# Patient Record
Sex: Male | Born: 1935
Health system: Southern US, Community
[De-identification: ages and names within clinical notes are randomized; demographics above are authoritative.]

## PROBLEM LIST (undated history)

## (undated) DIAGNOSIS — E785 Hyperlipidemia, unspecified: Secondary | ICD-10-CM

## (undated) DIAGNOSIS — I779 Disorder of arteries and arterioles, unspecified: Secondary | ICD-10-CM

## (undated) DIAGNOSIS — I10 Essential (primary) hypertension: Secondary | ICD-10-CM

## (undated) DIAGNOSIS — IMO0002 Reserved for concepts with insufficient information to code with codable children: Secondary | ICD-10-CM

## (undated) DIAGNOSIS — I2510 Atherosclerotic heart disease of native coronary artery without angina pectoris: Secondary | ICD-10-CM

## (undated) DIAGNOSIS — R001 Bradycardia, unspecified: Secondary | ICD-10-CM

## (undated) DIAGNOSIS — R569 Unspecified convulsions: Secondary | ICD-10-CM

## (undated) DIAGNOSIS — Q600 Renal agenesis, unilateral: Secondary | ICD-10-CM

## (undated) DIAGNOSIS — H15009 Unspecified scleritis, unspecified eye: Secondary | ICD-10-CM

## (undated) HISTORY — DX: Essential (primary) hypertension: I10

## (undated) HISTORY — DX: Bradycardia, unspecified: R00.1

## (undated) HISTORY — DX: Unspecified scleritis, unspecified eye: H15.009

## (undated) HISTORY — DX: Renal agenesis, unilateral: Q60.0

## (undated) HISTORY — DX: Reserved for concepts with insufficient information to code with codable children: IMO0002

## (undated) HISTORY — DX: Unspecified convulsions: R56.9

## (undated) HISTORY — DX: Hyperlipidemia, unspecified: E78.5

## (undated) HISTORY — DX: Atherosclerotic heart disease of native coronary artery without angina pectoris: I25.10

---

## 2001-04-19 ENCOUNTER — Ambulatory Visit (HOSPITAL_COMMUNITY): Admission: RE | Admit: 2001-04-19 | Discharge: 2001-04-19 | Payer: Self-pay | Admitting: Gastroenterology

## 2001-04-19 ENCOUNTER — Encounter (INDEPENDENT_AMBULATORY_CARE_PROVIDER_SITE_OTHER): Payer: Self-pay | Admitting: Specialist

## 2004-06-15 ENCOUNTER — Ambulatory Visit (HOSPITAL_COMMUNITY): Admission: RE | Admit: 2004-06-15 | Discharge: 2004-06-15 | Payer: Self-pay | Admitting: Gastroenterology

## 2004-06-15 ENCOUNTER — Encounter (INDEPENDENT_AMBULATORY_CARE_PROVIDER_SITE_OTHER): Payer: Self-pay | Admitting: Specialist

## 2007-03-04 ENCOUNTER — Inpatient Hospital Stay (HOSPITAL_COMMUNITY): Admission: EM | Admit: 2007-03-04 | Discharge: 2007-03-06 | Payer: Self-pay | Admitting: Emergency Medicine

## 2007-03-11 ENCOUNTER — Inpatient Hospital Stay (HOSPITAL_COMMUNITY): Admission: EM | Admit: 2007-03-11 | Discharge: 2007-03-18 | Payer: Self-pay | Admitting: Emergency Medicine

## 2007-09-19 HISTORY — PX: PACEMAKER INSERTION: SHX728

## 2007-12-17 ENCOUNTER — Encounter: Payer: Self-pay | Admitting: Internal Medicine

## 2008-05-22 ENCOUNTER — Encounter: Payer: Self-pay | Admitting: Internal Medicine

## 2008-05-29 ENCOUNTER — Encounter: Payer: Self-pay | Admitting: Internal Medicine

## 2008-07-09 ENCOUNTER — Ambulatory Visit: Payer: Self-pay | Admitting: Internal Medicine

## 2008-07-10 ENCOUNTER — Encounter (INDEPENDENT_AMBULATORY_CARE_PROVIDER_SITE_OTHER): Payer: Self-pay | Admitting: Internal Medicine

## 2008-07-10 ENCOUNTER — Ambulatory Visit: Payer: Self-pay | Admitting: Vascular Surgery

## 2008-07-10 ENCOUNTER — Inpatient Hospital Stay (HOSPITAL_COMMUNITY): Admission: EM | Admit: 2008-07-10 | Discharge: 2008-07-12 | Payer: Self-pay | Admitting: Emergency Medicine

## 2008-07-10 ENCOUNTER — Ambulatory Visit: Payer: Self-pay | Admitting: Internal Medicine

## 2008-07-17 ENCOUNTER — Ambulatory Visit: Payer: Self-pay | Admitting: Cardiology

## 2008-07-17 ENCOUNTER — Inpatient Hospital Stay (HOSPITAL_COMMUNITY): Admission: RE | Admit: 2008-07-17 | Discharge: 2008-07-18 | Payer: Self-pay | Admitting: Cardiology

## 2008-07-21 ENCOUNTER — Ambulatory Visit: Payer: Self-pay | Admitting: Cardiology

## 2008-07-21 LAB — CONVERTED CEMR LAB
BUN: 18 mg/dL (ref 6–23)
Basophils Absolute: 0 10*3/uL (ref 0.0–0.1)
Basophils Relative: 0 % (ref 0.0–3.0)
CO2: 28 meq/L (ref 19–32)
Calcium: 9.2 mg/dL (ref 8.4–10.5)
Chloride: 97 meq/L (ref 96–112)
Creatinine, Ser: 1.2 mg/dL (ref 0.4–1.5)
Eosinophils Absolute: 0.5 10*3/uL (ref 0.0–0.7)
Eosinophils Relative: 6.3 % — ABNORMAL HIGH (ref 0.0–5.0)
GFR calc Af Amer: 76 mL/min
GFR calc non Af Amer: 63 mL/min
Glucose, Bld: 89 mg/dL (ref 70–99)
HCT: 32 % — ABNORMAL LOW (ref 39.0–52.0)
Hemoglobin: 11.1 g/dL — ABNORMAL LOW (ref 13.0–17.0)
Lymphocytes Relative: 9.2 % — ABNORMAL LOW (ref 12.0–46.0)
MCHC: 34.8 g/dL (ref 30.0–36.0)
MCV: 98.7 fL (ref 78.0–100.0)
Monocytes Absolute: 1 10*3/uL (ref 0.1–1.0)
Monocytes Relative: 12.3 % — ABNORMAL HIGH (ref 3.0–12.0)
Neutro Abs: 5.7 10*3/uL (ref 1.4–7.7)
Neutrophils Relative %: 72.2 % (ref 43.0–77.0)
Platelets: 207 10*3/uL (ref 150–400)
Potassium: 4 meq/L (ref 3.5–5.1)
RBC: 3.24 M/uL — ABNORMAL LOW (ref 4.22–5.81)
RDW: 12.8 % (ref 11.5–14.6)
Sodium: 132 meq/L — ABNORMAL LOW (ref 135–145)
WBC: 7.9 10*3/uL (ref 4.5–10.5)

## 2008-07-27 ENCOUNTER — Ambulatory Visit: Payer: Self-pay | Admitting: Internal Medicine

## 2008-07-30 ENCOUNTER — Encounter (HOSPITAL_COMMUNITY): Admission: RE | Admit: 2008-07-30 | Discharge: 2008-09-16 | Payer: Self-pay | Admitting: Cardiology

## 2008-08-05 ENCOUNTER — Ambulatory Visit: Payer: Self-pay

## 2008-08-19 ENCOUNTER — Encounter: Payer: Self-pay | Admitting: Internal Medicine

## 2008-08-25 ENCOUNTER — Ambulatory Visit (HOSPITAL_COMMUNITY): Admission: RE | Admit: 2008-08-25 | Discharge: 2008-08-25 | Payer: Self-pay | Admitting: Family Medicine

## 2008-09-18 ENCOUNTER — Encounter (HOSPITAL_COMMUNITY): Admission: RE | Admit: 2008-09-18 | Discharge: 2008-11-16 | Payer: Self-pay | Admitting: Cardiology

## 2008-09-21 ENCOUNTER — Ambulatory Visit: Payer: Self-pay | Admitting: Internal Medicine

## 2008-09-22 DIAGNOSIS — I2510 Atherosclerotic heart disease of native coronary artery without angina pectoris: Secondary | ICD-10-CM | POA: Insufficient documentation

## 2008-09-22 DIAGNOSIS — I495 Sick sinus syndrome: Secondary | ICD-10-CM | POA: Insufficient documentation

## 2008-09-22 DIAGNOSIS — E782 Mixed hyperlipidemia: Secondary | ICD-10-CM | POA: Insufficient documentation

## 2008-09-22 DIAGNOSIS — R609 Edema, unspecified: Secondary | ICD-10-CM | POA: Insufficient documentation

## 2008-09-22 DIAGNOSIS — E785 Hyperlipidemia, unspecified: Secondary | ICD-10-CM | POA: Insufficient documentation

## 2008-10-15 ENCOUNTER — Ambulatory Visit (HOSPITAL_COMMUNITY): Admission: RE | Admit: 2008-10-15 | Discharge: 2008-10-15 | Payer: Self-pay | Admitting: Family Medicine

## 2008-10-28 ENCOUNTER — Encounter: Payer: Self-pay | Admitting: Cardiology

## 2008-11-09 ENCOUNTER — Ambulatory Visit: Payer: Self-pay | Admitting: Cardiology

## 2009-05-21 ENCOUNTER — Ambulatory Visit: Payer: Self-pay | Admitting: Internal Medicine

## 2009-06-29 ENCOUNTER — Ambulatory Visit: Payer: Self-pay | Admitting: Cardiology

## 2009-06-29 ENCOUNTER — Ambulatory Visit: Payer: Self-pay | Admitting: Internal Medicine

## 2009-06-30 ENCOUNTER — Encounter: Payer: Self-pay | Admitting: Cardiology

## 2009-07-06 LAB — CONVERTED CEMR LAB
AST: 25 units/L (ref 0–37)
Cholesterol: 135 mg/dL (ref 0–200)
HDL: 63.7 mg/dL (ref >39.00)
LDL Cholesterol: 64 mg/dL (ref 0–99)
Total CHOL/HDL Ratio: 2
Triglycerides: 37 mg/dL (ref 0.0–149.0)
VLDL: 7.4 mg/dL (ref 0.0–40.0)

## 2009-07-09 ENCOUNTER — Encounter: Payer: Self-pay | Admitting: Internal Medicine

## 2009-07-16 ENCOUNTER — Telehealth: Payer: Self-pay | Admitting: Internal Medicine

## 2009-07-27 ENCOUNTER — Encounter: Payer: Self-pay | Admitting: Internal Medicine

## 2009-08-06 ENCOUNTER — Encounter: Admission: RE | Admit: 2009-08-06 | Discharge: 2009-09-16 | Payer: Self-pay | Admitting: Ophthalmology

## 2009-10-04 ENCOUNTER — Ambulatory Visit: Payer: Self-pay | Admitting: Cardiology

## 2009-10-26 ENCOUNTER — Telehealth: Payer: Self-pay | Admitting: Internal Medicine

## 2010-01-03 ENCOUNTER — Ambulatory Visit: Payer: Self-pay | Admitting: Cardiology

## 2010-01-06 ENCOUNTER — Telehealth: Payer: Self-pay | Admitting: Internal Medicine

## 2010-01-24 ENCOUNTER — Telehealth: Payer: Self-pay | Admitting: Internal Medicine

## 2010-02-01 ENCOUNTER — Telehealth: Payer: Self-pay | Admitting: Internal Medicine

## 2010-02-03 ENCOUNTER — Encounter: Payer: Self-pay | Admitting: Internal Medicine

## 2010-04-04 ENCOUNTER — Ambulatory Visit: Payer: Self-pay | Admitting: Cardiology

## 2010-05-09 ENCOUNTER — Encounter: Payer: Self-pay | Admitting: Cardiology

## 2010-06-22 ENCOUNTER — Telehealth: Payer: Self-pay | Admitting: Internal Medicine

## 2010-06-23 ENCOUNTER — Ambulatory Visit: Payer: Self-pay | Admitting: Cardiology

## 2010-06-23 DIAGNOSIS — I10 Essential (primary) hypertension: Secondary | ICD-10-CM | POA: Insufficient documentation

## 2010-07-15 ENCOUNTER — Encounter (INDEPENDENT_AMBULATORY_CARE_PROVIDER_SITE_OTHER): Payer: Self-pay | Admitting: *Deleted

## 2010-10-18 ENCOUNTER — Telehealth: Payer: Self-pay | Admitting: Internal Medicine

## 2010-10-18 NOTE — Progress Notes (Signed)
Summary: refill   Phone Note Refill Request   Refills Requested: Medication #1:  METOPROLOL TARTRATE 50 MG TABS Take one tablet by mouth twice a day   Supply Requested: 3 months [facility] [phone]   Method Requested: Telephone to Pharmacy Initial call taken by: Migdalia Dk,  October 26, 2009 9:58 AM  Follow-up for Phone Call        pt's wife is aware Follow-up by: Hardin Negus, RMA,  October 26, 2009 2:39 PM    Prescriptions: METOPROLOL TARTRATE 50 MG TABS (METOPROLOL TARTRATE) Take one tablet by mouth twice a day  #0180 x 2   Entered by:   Hardin Negus, RMA   Authorized by:   Sherrill Raring, MD, [facility]   Signed by:   Hardin Negus, RMA on 10/26/2009   Method used:   Electronically to        [facility]* (retail)       [address] [place], [place]  [address]       Ph: [phone]       Fax: [phone]   RxID:   [id number]   Appended Document: refill    Clinical Lists Changes  Medications: Changed medication from METOPROLOL TARTRATE 50 MG TABS (METOPROLOL TARTRATE) Take one tablet by mouth twice a day to METOPROLOL TARTRATE 25 MG TABS (METOPROLOL TARTRATE) Take one tablet by mouth twice a day

## 2010-10-18 NOTE — Assessment & Plan Note (Signed)
Summary: pacer check.amber  Medications Added CENTRUM SILVER  TABS (MULTIPLE VITAMINS-MINERALS) 1 tab once daily TOBREX 0.3 % OINT (TOBRAMYCIN SULFATE) as needed CENTRUM SILVER  TABS (MULTIPLE VITAMINS-MINERALS) 1 tab once daily ASPIRIN EC 325 MG TBEC (ASPIRIN) Take one tablet by mouth daily      Allergies Added:   Visit Type:  Follow-up Primary Provider:  dr Tiburcio Pea (eagel)  CC:  no complaints.  History of Present Illness: [name] returns for management of his pacemaker. In September of 2009 he was admitted after a syncopal episode and was thought to have an acute cardiac syndrome. He underwent pacemaker implantation by myself for marked bradycardia and a week later had rotational atherectomy and stenting of the right coronary using a drill to be sent by Dr. Riley Kill. He has done quite well since that time has had no recent chest pain shortness of breath or palpitations. He saw Dr. Tenny Craw who is his primary cardiologist in September.  Current Medications (verified): 1)  Centrum Silver  Tabs (Multiple Vitamins-Minerals) .Marland Kitchen.. 1 Tab Once Daily 2)  Nitroglycerin 0.4 Mg Subl (Nitroglycerin) .... One Tablet Under Tongue Every 5 Minutes As Needed For Chest Pain---May Repeat Times Three 3)  Simvastatin 40 Mg Tabs (Simvastatin) .... Take One Tablet By Mouth Daily At Bedtime 4)  Plavix 75 Mg Tabs (Clopidogrel Bisulfate) .... Take One Tablet By Mouth Daily 5)  Amlodipine Besylate 5 Mg Tabs (Amlodipine Besylate) .... Take One Tablet By Mouth Daily 6)  Metoprolol Tartrate 50 Mg Tabs (Metoprolol Tartrate) .... Take One Tablet By Mouth Twice A Day 7)  Temazepam 30 Mg Caps (Temazepam) .Marland Kitchen.. 1 Tab By Mouth Once Daily 8)  Metrogel-Vaginal 0.75 % Gel (Metronidazole) .... As Directed 9)  Fish Oil   Oil (Fish Oil) .Marland Kitchen.. 1 Tab By Mouth Once Daily 10)  Caltrate 600+d 600-400 Mg-Unit Tabs (Calcium Carbonate-Vitamin D) .Marland Kitchen.. 1 Tab By Mouth Two Times A Day 11)  Icaps Mv  Tabs (Multiple Vitamins-Minerals) .Marland Kitchen.. 1 Tab  By Mouth Two Times A Day 12)  Vitamin D3 2000 Unit Caps (Cholecalciferol) .Marland Kitchen.. 1 Tab By Mouth Once Daily 13)  Prednisolone Sodium Phosphate .Marland Kitchen.. 6 Drops in Right Eye 14)  Tobrex 0.3 % Oint (Tobramycin Sulfate) .... As Needed 15)  Centrum Silver  Tabs (Multiple Vitamins-Minerals) .Marland Kitchen.. 1 Tab Once Daily 16)  Aspirin Ec 325 Mg Tbec (Aspirin) .... Take One Tablet By Mouth Daily  Allergies (verified): 1)  ! * Cyclosporin  Past History:  Past Medical History: Reviewed history from 09/22/2008 and no changes required. Bradycardia with syncope.  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   Vital Signs:  Patient profile:   77 year old male Height:      70 inches Weight:      152 pounds BMI:     21.89 Pulse rate:   67 / minute BP sitting:   160 / 68  (left arm) Cuff size:   regular  Vitals Entered By: Burnett Kanaris, CNA (June 29, 2009 11:29 AM)  Physical Exam  Additional Exam:  Gen. Well-nourished, in no distress   Neck: No JVD, thyroid not enlarged, no carotid bruits Lungs: No tachypnea, clear without rales, rhonchi or wheezes Cardiovascular: Rhythm regular, PMI not displaced,  heart sounds  normal, no murmurs or gallops, no peripheral edema,  pulses normal in all 4 extremities. Abdomen: BS normal, abdomen soft and non-tender without masses or organomegaly, no hepatosplenomegaly. MS: No deformities, no cyanosis or clubbing   Neuro:  No focal sns   Skin:  no lesions    PPM Specifications Following MD:  Everardo Beals. Juanda Chance, MD     PPM Vendor:  [employer]     PPM Model Number:  [id number]     PPM Serial Number:  [id number] DOI:  07/10/2008     PPM Implanting MD:  Everardo Beals. Juanda Chance, MD  Lead 1    Location: [facility]     DOI: 07/10/2008     Model #: [id number]     Serial #: [id number]     Status: active Lead 2     Location: RV     DOI: 07/10/2008     Model #: [id number]     Serial #: [id number]     Status: active  Magnet Response Rate:  BOL 98.6 ERI  86.3    PPM Follow Up Remote Check?  No Battery Voltage:  2.82 V     Battery Est. Longevity:  10 years       PPM Device Measurements Atrium  Amplitude: 0.5 mV, Impedance: 476 ohms, Threshold: 0.5 V at 0.5 msec Right Ventricle  Amplitude: 12 mV, Impedance: 469 ohms, Threshold: 0.75 V at 0.5 msec  Episodes MS Episodes:  2     Percent Mode Switch:  <1%     Coumadin:  No Ventricular High Rate:  0     Atrial Pacing:  54%     Ventricular Pacing:  <1%  Parameters Mode:  DDD     Lower Rate Limit:  60     Upper Rate Limit:  120 Paced AV Delay:  200     Sensed AV Delay:  200 Next Cardiology Appt Due:  12/17/2009 Tech Comments:  RA 2.0@0 .5, 0.68mV.  Checked by [employer].  TTM's with [employer]. ROV 6 months.   Altha Harm, LPN  June 29, 2009 12:33 PM   Impression & Recommendations:  Problem # 1:  PACEMAKER (ICD-V45.Marland Kitchen01) He had his pacemaker put in in September of 2009 for bradycardia. We interrogated his pacemaker today and he is pacing the atrium about 40% of the time and is not pacing the ventricle hardly at all. All his parameters are good. We will plan to put him on telephone checks today.  Problem # 2:  CAD, NATIVE VESSEL (ICD-414.01) He is a drug-eluting stent in the right coronary artery 2 Sherrilyn Rist syndrome which is implanted September 2009. This is stable we'll continue his current therapy.  Patient Instructions: 1)  Your physician wants you to follow-up in: 12 months. You will receive a reminder letter in the mail two months in advance. If you don't receive a letter, please call our office to schedule the follow-up appointment.

## 2010-10-18 NOTE — Progress Notes (Signed)
Summary: pt has swelling in ankles   Phone Note Call from Patient Call back at Home Phone [phone]   Caller: Patient Reason for Call: Talk to Nurse, Talk to Doctor Summary of Call: pt has swelling in his ankles mainly his left one and wants to let MD know and find what he can do about it Initial call taken by: Omer Jack,  January 06, 2010 1:57 PM  Follow-up for Phone Call        Called patient back concerning swelling in ankles...he admits to some increase in salt in his diet and being outside on his feet more than usual. Advised him to cut back on his salt intake and to elevate legs when he is sitting in a chair. He states that he notices that the  swelling does go down in the AM when he gets out of bed. Advised will follow up with him next week to check his status.  Follow-up by: Suzan Garibaldi RN

## 2010-10-18 NOTE — Cardiovascular Report (Signed)
Summary: Office Visit   Office Visit   Imported By: Roderic Ovens 06/27/2010 11:14:32  _____________________________________________________________________  External Attachment:    Type:   Image     Comment:   External Document

## 2010-10-18 NOTE — Procedures (Signed)
Summary: [facility]   Imported By: Kassie Mends 08/02/2009 13:18:13  _____________________________________________________________________  External Attachment:    Type:   Image     Comment:   External Document

## 2010-10-18 NOTE — Progress Notes (Signed)
Summary: FYI concerning bp   Phone Note Call from Patient Call back at [facility] Phone [phone]   Caller: Patient Summary of Call: pt states that his bp has stayed about the same. swelling has decreased. pt is elevating legs Initial call taken by: Edman Circle,  Feb 01, 2010 9:52 AM  Follow-up for Phone Call        [facility] Lisabeth Devoid RN   Additional Follow-up for Phone Call Additional follow up Details #1::        I would keep him on new dose.  Again elevate legs to prevent swelling. Additional Follow-up by: Sherrill Raring, MD, [facility],  Feb 01, 2010 2:39 PM    Additional Follow-up for Phone Call Additional follow up Details #2::    I spoke with Mr. Gillen.  He is feeling much better.  "The swelling is about 90% gone and I can see my ankles." His blood pressure has been: 116/62, 119/67, 108/64.  According to Mr. Sievers he has been keeping his feet up and continuing to watch his salt.  He will call back if he has any further problems. He will continue on his decreased dose of Norvasc. Lisabeth Devoid RN

## 2010-10-18 NOTE — Progress Notes (Signed)
Summary: did we get lab results   Phone Note Call from Patient Call back at Home Phone [phone]   Caller: Spouse/Jean Reason for Call: Talk to Nurse, Talk to Doctor Summary of Call: did we get a copy of lab work done at Dr. Tiburcio Pea office the wants to make sure we have it for appt with Dr. Juanda Chance Please call to confirm if we have it or not Initial call taken by: Omer Jack,  June 22, 2010 1:22 PM  Follow-up for Phone Call        I spoke with Mr. Sparr wife about labs.  We have not yet received those from Dr. Tiburcio Pea' office.  I gave her our fax number and she will call them to refax results to Dr. Juanda Chance.  Appt. is 06/23/10 Mylo Red RN

## 2010-10-18 NOTE — Cardiovascular Report (Signed)
Summary: TTM   TTM   Imported By: Roderic Ovens 10/26/2009 16:08:51  _____________________________________________________________________  External Attachment:    Type:   Image     Comment:   External Document

## 2010-10-18 NOTE — Assessment & Plan Note (Signed)
Summary: PER CHECK OUT/SF  Medications Added PLAVIX 75 MG TABS (CLOPIDOGREL BISULFATE) Take one tablet by mouth daily AMLODIPINE BESYLATE 5 MG TABS (AMLODIPINE BESYLATE) Take one tablet by mouth daily METOPROLOL SUCCINATE 50 MG XR24H-TAB (METOPROLOL SUCCINATE) Take one tablet by mouth daily METOPROLOL TARTRATE 50 MG TABS (METOPROLOL TARTRATE) Take one tablet by mouth twice a day TEMAZEPAM 30 MG CAPS (TEMAZEPAM) 1 tab by mouth once daily METROGEL-VAGINAL 0.75 % GEL (METRONIDAZOLE) as directed FISH OIL   OIL (FISH OIL) 1 tab by mouth once daily ASPIRIN 325 MG  TABS (ASPIRIN) 1 tab by mouth once daily CALTRATE 600+D 600-400 MG-UNIT TABS (CALCIUM CARBONATE-VITAMIN D) 1 tab by mouth two times a day CALTRATE 600+D 600-400 MG-UNIT TABS (CALCIUM CARBONATE-VITAMIN D) 1 tab by mouth once daily ICAPS MV  TABS (MULTIPLE VITAMINS-MINERALS) 1 tab by mouth two times a day ICAPS MV  TABS (MULTIPLE VITAMINS-MINERALS) 1 atb by mouth once daily VITAMIN D3 2000 UNIT CAPS (CHOLECALCIFEROL) 1 tab by mouth once daily * PREDNISOLONE SODIUM PHOSPHATE 6 drops in right eye VIGAMOX 0.5 % SOLN (MOXIFLOXACIN HCL) 4 drops in right eye TOBREX 0.3 % OINT (TOBRAMYCIN SULFATE) at bedtime        CC:  no complaints.  History of Present Illness: patient is a 77 year old with a history of CAD (s/p rotational athrectomy with DES to RCA in fall 2009).  He also has a history of bradycardia (s/p PPM).  He was last seen by B. Juanda Chance in the winter of this year. Since seen, he has done well.  He denies chest pain, significant shortness of breath, dizziness, palpitations.  He stays active.  Problems Prior to Update: 1)  Cad, Native Vessel  (ICD-414.01) 2)  Syncope and Collapse  (ICD-780.2) 3)  Pacemaker  (ICD-V45.Marland Kitchen01) 4)  Hyperlipidemia-mixed  (ICD-272.4)  Medications Prior to Update: 1)  Nitroglycerin 0.4 Mg Subl (Nitroglycerin) .... One Tablet Under Tongue Every 5 Minutes As Needed For Chest Pain---May Repeat Times Three 2)   Simvastatin 40 Mg Tabs (Simvastatin) .... Take One Tablet By Mouth Daily At Bedtime  Current Medications (verified): 1)  Nitroglycerin 0.4 Mg Subl (Nitroglycerin) .... One Tablet Under Tongue Every 5 Minutes As Needed For Chest Pain---May Repeat Times Three 2)  Simvastatin 40 Mg Tabs (Simvastatin) .... Take One Tablet By Mouth Daily At Bedtime 3)  Plavix 75 Mg Tabs (Clopidogrel Bisulfate) .... Take One Tablet By Mouth Daily 4)  Amlodipine Besylate 5 Mg Tabs (Amlodipine Besylate) .... Take One Tablet By Mouth Daily 5)  Metoprolol Tartrate 50 Mg Tabs (Metoprolol Tartrate) .... Take One Tablet By Mouth Twice A Day 6)  Temazepam 30 Mg Caps (Temazepam) .Marland Kitchen.. 1 Tab By Mouth Once Daily 7)  Metrogel-Vaginal 0.75 % Gel (Metronidazole) .... As Directed 8)  Fish Oil   Oil (Fish Oil) .Marland Kitchen.. 1 Tab By Mouth Once Daily 9)  Aspirin 325 Mg  Tabs (Aspirin) .Marland Kitchen.. 1 Tab By Mouth Once Daily 10)  Caltrate 600+d 600-400 Mg-Unit Tabs (Calcium Carbonate-Vitamin D) .Marland Kitchen.. 1 Tab By Mouth Two Times A Day 11)  Icaps Mv  Tabs (Multiple Vitamins-Minerals) .Marland Kitchen.. 1 Tab By Mouth Two Times A Day 12)  Vitamin D3 2000 Unit Caps (Cholecalciferol) .Marland Kitchen.. 1 Tab By Mouth Once Daily 13)  Prednisolone Sodium Phosphate .Marland Kitchen.. 6 Drops in Right Eye 14)  Vigamox 0.5 % Soln (Moxifloxacin Hcl) .... 4 Drops in Right Eye 15)  Tobrex 0.3 % Oint (Tobramycin Sulfate) .... At Bedtime  Allergies: 1)  ! * Cyclosporin  Past History:  Past Medical History: Last updated: 09/22/2008 Bradycardia with syncope.  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  Negative to the above problem except as noted.  Vital Signs:  Patient profile:   77 year  old male Height:      70 inches Weight:      158 pounds BMI:     22.75 Pulse rate:   66 / minute Resp:     12 per minute BP sitting:   143 / 70  (left arm)  Vitals Entered By: Kem Parkinson (May 21, 2009 8:31 AM)  Physical Exam  Additional Exam:  HEENT:  Normocephalic, atraumatic. EOMI, PERRLA.  Neck: JVP is normal. No thyromegaly. No bruits.  Lungs: clear to auscultation. No rales no wheezes.  Heart: Regular rate and rhythm. Normal S1, S2. No S3.   No significant murmurs. PMI not displaced.  Abdomen:  Supple, nontender. Normal bowel sounds. No masses. No hepatomegaly.  Extremities:   Good distal pulses throughout. No lower extremity edema.  Musculoskeletal :moving all extremities.  Neuro:   alert and oriented x3.    PPM Specifications PPM Vendor:  [employer]     PPM Model Number:  [id number]     PPM Serial Number:  [id number] PPM DOI:  07/10/2008     PPM Implanting MD:  Everardo Beals. Juanda Chance, MD  Lead 1    Location: RA     DOI: 07/10/2008     Model #: [id number]     Serial #: [id number]     Status: active Lead 2    Location: RV     DOI: 07/10/2008     Model #: [id number]     Serial #: [id number]     Status: active  Magnet Response Rate:  BOL 98.6 ERI  86.3    Impression & Recommendations:  Problem # 1:  CAD, NATIVE VESSEL (ICD-414.01) Clinically doing well.  Will check on Plavix.  Continue all meds for now. His updated medication list for this problem includes:    Nitroglycerin 0.4 Mg Subl (Nitroglycerin) ..... One tablet under tongue every 5 minutes as needed for chest pain---may repeat times three    Plavix 75 Mg Tabs (Clopidogrel bisulfate) .Marland Kitchen... Take one tablet by mouth daily    Amlodipine Besylate 5 Mg Tabs (Amlodipine besylate) .Marland Kitchen... Take one tablet by mouth daily    Metoprolol Tartrate 50 Mg Tabs (Metoprolol tartrate) .Marland Kitchen... Take one tablet by mouth twice a day    Aspirin 325 Mg Tabs (Aspirin) .Marland Kitchen... 1 tab by mouth once daily  Problem # 2:  SYNCOPE AND COLLAPSE (ICD-780.2) s/p  pacemaker  Problem # 3:  HYPERLIPIDEMIA-MIXED (ICD-272.4) Need to review fasting.  Continue for now. His updated medication list for this problem includes:    Simvastatin 40 Mg Tabs (Simvastatin) .Marland Kitchen... Take one tablet by mouth daily at bedtime

## 2010-10-18 NOTE — Miscellaneous (Signed)
  Clinical Lists Changes  Medications: Changed medication from AMLODIPINE BESYLATE 5 MG TABS (AMLODIPINE BESYLATE) Take one tablet by mouth daily to AMLODIPINE BESYLATE 5 MG TABS (AMLODIPINE BESYLATE) Take one half  tablet by mouth daily

## 2010-10-18 NOTE — Consult Note (Signed)
Summary: [facility]   Imported By: Marylou Mccoy 05/21/2009 12:05:03  _____________________________________________________________________  External Attachment:    Type:   Image     Comment:   External Document

## 2010-10-18 NOTE — Progress Notes (Signed)
Summary: needs to dc meds for procedure on 11-9   Phone Note Call from Patient Call back at Home Phone [phone]   Caller: Patient Reason for Call: Talk to Nurse Summary of Call: Scheduled for colonoscopy on 07-27-09.  Needs to discontinue Plavix five days prior.  Please contact Dr. [facility] [phone]).  Ok to leave message on machine at patient's home with instructions. Initial call taken by: Burnard Leigh,  July 16, 2009 10:42 AM  Follow-up for Phone Call        Patient had PTCA/DES in October 2009/  OK to stop Plavix for procedure.  Resume after. Follow-up by: Sherrill Raring, MD, [facility],  July 18, 2009 9:08 PM     Appended Document: needs to dc meds for procedure on 11-9 LM with information to hold Plavix 5 to 7 days prior to procedure on pt's home answering machine. Note faxed to [facility].

## 2010-10-18 NOTE — Cardiovascular Report (Signed)
Summary: Office Visit   Office Visit   Imported By: Roderic Ovens 07/02/2009 15:43:56  _____________________________________________________________________  External Attachment:    Type:   Image     Comment:   External Document

## 2010-10-18 NOTE — Cardiovascular Report (Signed)
Summary: TTM   TTM   Imported By: Roderic Ovens 04/01/2010 15:50:22  _____________________________________________________________________  External Attachment:    Type:   Image     Comment:   External Document

## 2010-10-18 NOTE — Letter (Signed)
[facility]  [facility]   Imported By: Roderic Ovens 08/17/2009 12:20:31  _____________________________________________________________________  External Attachment:    Type:   Image     Comment:   External Document

## 2010-10-18 NOTE — Miscellaneous (Signed)
Summary: dx correction  Clinical Lists Changes  Problems: Changed problem from PACEMAKER (ICD-V45..01) to PACEMAKER, PERMANENT (ICD-V45.01)  changed the incorrect dx code to correct dx code 

## 2010-10-18 NOTE — Letter (Signed)
Summary: [facility]   Imported By: Kassie Mends 07/30/2009 11:36:59  _____________________________________________________________________  External Attachment:    Type:   Image     Comment:   External Document

## 2010-10-18 NOTE — Progress Notes (Signed)
Summary: pt still having swelling   Phone Note Call from Patient Call back at Home Phone [phone]   Caller: Patient Reason for Call: Talk to Nurse Summary of Call: per pt call cutting salt out didn't work for swelling so he is going to try plan B and cut meds in half please call to confirm this is what he needs to do. you can leave a message if he nots there Initial call taken by: Omer Jack,  Jan 24, 2010 12:33 PM  Follow-up for Phone Call        Called patient back..he states that even though he has really watched his salt intake there is still evidence of lower extremity edema ,worse in the left foot/ankle. Advised him to cut the dose in half of the Norvasc and check his BP every day and call me back in 1 week. His states that his BP has been about 120-115 /60's. Will inform Dr.Chico Cawood that he called . Follow-up by: Suzan Garibaldi RN  Additional Follow-up for Phone Call Additional follow up Details #1::        Agree with plan. Additional Follow-up by: Sherrill Raring, MD, [facility],  Jan 24, 2010 4:31 PM

## 2010-10-18 NOTE — Assessment & Plan Note (Signed)
Summary: f1y  Medications Added STOOL SOFTENER 100 MG CAPS (DOCUSATE SODIUM) two times a day        Visit Type:  1 year follow up Primary Provider:  dr Tiburcio Pea (eagel)  CC:  No complains.  History of Present Illness: [name] is 77 years old and return for management of his pacemaker and CAD. Dr. Tenny Craw follows him for CAD and I have been seeing him for his pacemaker. In 2000 He had an episode of syncope with documented bradycardia and underwent implantation of a DDD pacemaker which was a [employer] Jude device. Later in 2009 he had rotational arthrectomy and stenting of the right coronary.  He has done well since that time has had no recent chest pain shortness breath or palpitations.  His other problems include hypertension hyperlipidemia. His blood pressures have been elevated when he comes in for office visits but he takes his blood pressure at home and it generally runs 120 or less. He had a lipid profile 2 months ago by his primary care physician which was excellent.  Current Medications (verified): 1)  Centrum Silver  Tabs (Multiple Vitamins-Minerals) .Marland Kitchen.. 1 Tab Once Daily 2)  Nitroglycerin 0.4 Mg Subl (Nitroglycerin) .... One Tablet Under Tongue Every 5 Minutes As Needed For Chest Pain---May Repeat Times Three 3)  Simvastatin 40 Mg Tabs (Simvastatin) .... Take One Tablet By Mouth Daily At Bedtime 4)  Plavix 75 Mg Tabs (Clopidogrel Bisulfate) .... Take One Tablet By Mouth Daily 5)  Amlodipine Besylate 5 Mg Tabs (Amlodipine Besylate) .... Take One Half  Tablet By Mouth Daily 6)  Metoprolol Tartrate 25 Mg Tabs (Metoprolol Tartrate) .... Take One Tablet By Mouth Twice A Day 7)  Temazepam 30 Mg Caps (Temazepam) .Marland Kitchen.. 1 Tab By Mouth Once Daily 8)  Metrogel-Vaginal 0.75 % Gel (Metronidazole) .... As Directed 9)  Fish Oil   Oil (Fish Oil) .Marland Kitchen.. 1 Tab By Mouth Once Daily 10)  Caltrate 600+d 600-400 Mg-Unit Tabs (Calcium Carbonate-Vitamin D) .Marland Kitchen.. 1 Tab By Mouth Two Times A Day 11)  Icaps Mv  Tabs  (Multiple Vitamins-Minerals) .Marland Kitchen.. 1 Tab By Mouth Two Times A Day 12)  Vitamin D3 2000 Unit Caps (Cholecalciferol) .Marland Kitchen.. 1 Tab By Mouth Once Daily 13)  Prednisolone Sodium Phosphate .Marland Kitchen.. 6 Drops in Right Eye 14)  Tobrex 0.3 % Oint (Tobramycin Sulfate) .... As Needed 15)  Centrum Silver  Tabs (Multiple Vitamins-Minerals) .Marland Kitchen.. 1 Tab Once Daily 16)  Aspirin Ec 325 Mg Tbec (Aspirin) .... Take One Tablet By Mouth Daily 17)  Stool Softener 100 Mg Caps (Docusate Sodium) .... Two Times A Day  Allergies: 1)  ! * Cyclosporin  Past History:  Past Medical History: Reviewed history from 09/22/2008 and no changes required. Bradycardia with syncope.  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   Vital Signs:  Patient profile:   77 year old male Height:      70 inches Weight:      156.50 pounds BMI:     22.54 Pulse rate:   60 / minute Pulse rhythm:   regular Resp:     18 per minute BP sitting:   178 / 100  (left arm) Cuff size:   large  Vitals Entered By: Vikki Ports (June 23, 2010 11:26 AM)  Physical Exam  Additional  Exam:  Gen. Well-nourished, in no distress   Neck: No JVD, thyroid not enlarged, no carotid bruits Lungs: No tachypnea, clear without rales, rhonchi or wheezes Cardiovascular: Rhythm regular, PMI not displaced,  heart sounds  normal, no murmurs or gallops, no peripheral edema, pulses normal in all 4 extremities. Abdomen: BS normal, abdomen soft and non-tender without masses or organomegaly, no hepatosplenomegaly. MS: No deformities, no cyanosis or clubbing   Neuro:  No focal sns   Skin:  no lesions    PPM Specifications Following MD:  Everardo Beals. Juanda Chance, MD     PPM Vendor:  St Jude     PPM Model Number:  [id number]     PPM Serial Number:  [id number] PPM DOI:  07/10/2008     PPM Implanting  MD:  Everardo Beals. Juanda Chance, MD  Lead 1    Location: RA     DOI: 07/10/2008     Model #: [id number]     Serial #: [id number]     Status: active Lead 2    Location: RV     DOI: 07/10/2008     Model #: 1688TC     Serial #: [id number]     Status: active  Magnet Response Rate:  BOL 98.6 ERI  86.3    PPM Follow Up Battery Voltage:  2.79 V     Battery Est. Longevity:  6.75-58yrs       PPM Device Measurements Atrium  Amplitude: 0.7 mV, Impedance: 429 ohms, Threshold: 0.75 V at 0.5 msec Right Ventricle  Amplitude: 12.0 mV, Impedance: 429 ohms, Threshold: 1.25 V at 0.5 msec  Episodes MS Episodes:  0     Coumadin:  No Ventricular High Rate:  0     Atrial Pacing:  49%     Ventricular Pacing:  <1%  Parameters Mode:  DDD     Lower Rate Limit:  60     Upper Rate Limit:  120 Paced AV Delay:  200     Sensed AV Delay:  200 Next Cardiology Appt Due:  12/19/2010 Tech Comments:  NORMAL DEVICE FUNCTION. NO AMS EPISODES SINCE LAST CHECK.  NO CHANGES MADE. ROV IN 6 MTHS W/DEVICE CLINIC. Vella Kohler  June 23, 2010 11:47 AM  Impression & Recommendations:  Problem # 1:  PACEMAKER (ICD-V45.Marland Kitchen01) He has a DDD pacemaker implanted for bradycardia and syncope. He is pacing the atrium about half the time and pacing the ventricle very little. He has had 2 brief episodes of mode switches. He had good thresholds on both leads.  Problem # 2:  CAD, NATIVE VESSEL (ICD-414.01) He had previous PCI of the RCA. He has had no chest pain this problem appears stable. His updated medication list for this problem includes:    Nitroglycerin 0.4 Mg Subl (Nitroglycerin) ..... One tablet under tongue every 5 minutes as needed for chest pain---may repeat times three    Plavix 75 Mg Tabs (Clopidogrel bisulfate) .Marland Kitchen... Take one tablet by mouth daily    Amlodipine Besylate 5 Mg Tabs (Amlodipine besylate) .Marland Kitchen... Take one half  tablet by mouth daily    Metoprolol Tartrate 25 Mg Tabs (Metoprolol tartrate) .Marland Kitchen... Take one tablet by mouth twice a  day    Aspirin Ec 325 Mg Tbec (Aspirin) .Marland Kitchen... Take one tablet by mouth daily  Orders: EKG w/ Interpretation (93000)  Problem # 3:  HYPERTENSION, BENIGN (ICD-401.1) His blood pressure is elevated today but he has been normal at home. We will continue current therapy. His updated medication list  for this problem includes:    Amlodipine Besylate 5 Mg Tabs (Amlodipine besylate) .Marland Kitchen... Take one half  tablet by mouth daily    Metoprolol Tartrate 25 Mg Tabs (Metoprolol tartrate) .Marland Kitchen... Take one tablet by mouth twice a day    Aspirin Ec 325 Mg Tbec (Aspirin) .Marland Kitchen... Take one tablet by mouth daily  Patient Instructions: 1)  Your physician wants you to follow-up in: 6 months with our device nurses for a pacemaker check & 1 year with Dr. Johney Frame.  You will receive a reminder letter in the mail two months in advance. If you don't receive a letter, please call our office to schedule the follow-up appointment. 2)  Your physician recommends that you continue on your current medications as directed. Please refer to the Current Medication list given to you today.

## 2010-10-18 NOTE — Miscellaneous (Signed)
Summary: Device prelaod  Clinical Lists Changes  Observations: Added new observation of MAGNET RTE: BOL 98.6 ERI  86.3 (10/28/2008 8:59) Added new observation of PPMLEADSTAT2: active (10/28/2008 8:59) Added new observation of PPMLEADSER2: [id number] (10/28/2008 8:59) Added new observation of PPMLEADMOD2: 1688TC (10/28/2008 8:59) Added new observation of PPMLEADDOI2: 07/10/2008 (10/28/2008 8:59) Added new observation of PPMLEADLOC2: RV (10/28/2008 8:59) Added new observation of PPMLEADSTAT1: active (10/28/2008 8:59) Added new observation of PPMLEADSER1: [id number] (10/28/2008 8:59) Added new observation of PPMLEADMOD1: 1688TC (10/28/2008 8:59) Added new observation of PPMLEADDOI1: 07/10/2008 (10/28/2008 8:59) Added new observation of PPMLEADLOC1: RA (10/28/2008 8:59) Added new observation of PPM IMP MD: Everardo Beals. Juanda Chance, MD (10/28/2008 8:59) Added new observation of PPM DOI: 07/10/2008 (10/28/2008 8:59) Added new observation of PPM SERL#: [id number]  (10/28/2008 8:59) Added new observation of PPM MODL#: [id number]  (10/28/2008 8:59) Added new observation of PACEMAKERMFG: St Jude  (10/28/2008 8:59) Added new observation of CARDIO MD: Everardo Beals. Juanda Chance, MD  (10/28/2008 8:59)      PPM Specifications Following MD:  Everardo Beals. Juanda Chance, MD     PPM Vendor:  [employer] Jude     PPM Model Number:  [id number]     PPM Serial Number:  [id number] PPM DOI:  07/10/2008       Lead 1    Location: RA     DOI: 07/10/2008     Model #: 1688TC     Serial #: [id number]     Status: active Lead 2    Location: RV     DOI: 07/10/2008     Model #: 1688TC     Serial #: [id number]     Status: active  Magnet Response Rate:  BOL 98.6 ERI  86.3  ICD Specifications Following MD: Everardo Beals. Juanda Chance, MD

## 2010-10-18 NOTE — Cardiovascular Report (Signed)
Summary: TTM   TTM   Imported By: Roderic Ovens 06/16/2010 16:13:43  _____________________________________________________________________  External Attachment:    Type:   Image     Comment:   External Document

## 2010-10-26 NOTE — Progress Notes (Signed)
Summary: refill request  Phone Note Refill Request Message from:  Patient on October 18, 2010 3:24 PM  Refills Requested: Medication #1:  AMLODIPINE BESYLATE 5 MG TABS Take one half  tablet by mouth daily sam's    Method Requested: Telephone to Pharmacy Initial call taken by: Glynda Jaeger,  October 18, 2010 3:24 PM    New/Updated Medications: AMLODIPINE BESYLATE 5 MG TABS (AMLODIPINE BESYLATE) take one tablet as directed by physican Prescriptions: AMLODIPINE BESYLATE 5 MG TABS (AMLODIPINE BESYLATE) take one tablet as directed by physican  #30 x 6   Entered by:   Burnett Kanaris, CNA   Authorized by:   Sherrill Raring, MD, [facility]   Signed by:   Burnett Kanaris, CNA on 10/19/2010   Method used:   Electronically to        [facility]* (retail)       [address] [place], [place]  [address]       Ph: [phone]       Fax: [phone]   RxID:   [id number]

## 2010-11-04 ENCOUNTER — Encounter: Payer: Self-pay | Admitting: Internal Medicine

## 2010-11-04 DIAGNOSIS — I498 Other specified cardiac arrhythmias: Secondary | ICD-10-CM

## 2010-11-28 ENCOUNTER — Telehealth: Payer: Self-pay | Admitting: Internal Medicine

## 2010-11-29 NOTE — Cardiovascular Report (Signed)
Summary: TTM   TTM   Imported By: Roderic Ovens 11/21/2010 15:05:29  _____________________________________________________________________  External Attachment:    Type:   Image     Comment:   External Document

## 2010-12-06 NOTE — Progress Notes (Signed)
Summary: plavix  Phone Note Refill Request Message from:  Patient on November 28, 2010 10:21 AM  Refills Requested: Medication #1:  PLAVIX 75 MG TABS Take one tablet by mouth daily Send to [facility] 90 day supply  Initial call taken by: Judie Grieve,  November 28, 2010 10:21 AM  Follow-up for Phone Call        called and LVM letting pt know is Rx had been called into his pharmacy as requested.  Plavix 75mg  Caralee Ates CMA  November 28, 2010 3:12 PM Follow-up by: Caralee Ates CMA,  November 28, 2010 3:12 PM    Prescriptions: PLAVIX 75 MG TABS (CLOPIDOGREL BISULFATE) Take one tablet by mouth daily  #90 x 1   Entered by:   Caralee Ates CMA   Authorized by:   Sherrill Raring, MD, [facility]   Signed by:   Caralee Ates CMA on 11/28/2010   Method used:   Telephoned to ...       [facility]* (retail)       [address] [place], [place]  [address]       Ph: [phone]       Fax: [phone]   RxID:   [id number]

## 2011-01-12 ENCOUNTER — Ambulatory Visit (INDEPENDENT_AMBULATORY_CARE_PROVIDER_SITE_OTHER): Payer: Medicare Other | Admitting: *Deleted

## 2011-01-12 DIAGNOSIS — R55 Syncope and collapse: Secondary | ICD-10-CM

## 2011-01-12 DIAGNOSIS — I498 Other specified cardiac arrhythmias: Secondary | ICD-10-CM

## 2011-01-12 DIAGNOSIS — Z950 Presence of cardiac pacemaker: Secondary | ICD-10-CM

## 2011-01-16 ENCOUNTER — Other Ambulatory Visit: Payer: Self-pay | Admitting: Internal Medicine

## 2011-01-31 NOTE — Cardiovascular Report (Signed)
NAMEKENTRELL, HALLAHAN                 ACCOUNT NO.:  [id number]   MEDICAL RECORD NO.:  [id number]          PATIENT TYPE:  INP   LOCATION:  6524                         FACILITY:  [facility]   PHYSICIAN:  Arturo Morton. Riley Kill, MD, FACCDATE OF BIRTH:  [birth year]   DATE OF PROCEDURE:  DATE OF DISCHARGE:                            CARDIAC CATHETERIZATION   INDICATIONS:  Mr. Lamoureaux is a 77 year old gentleman who recently  presented with syncope and prolonged bradycardia.  He underwent  permanent pacer implantation.  Prior to this, he underwent cardiac  catheterization demonstrating three-vessel disease and critical disease  of the RCA.  Dr. Graciela Husbands, Dr. Juanda Chance, and I all reviewed the films.  The  plan was to place a permanent pacemaker followed by percutaneous  intervention of the RCA.  Notably, the patient has not been symptomatic  from the standpoint of chest pain, but did present with rather  significant bradycardia and does have critical disease of the RCA.  It  is an extremely large vessel.  Risks, benefits, and alternatives were  discussed with the patient in detail and he consented to proceed.   PROCEDURE:  Percutaneous drug-eluting stent implantation of the right  coronary artery with adjunctive rotational atherectomy.   DESCRIPTION OF PROCEDURE:  Following informed consent, the patient was  brought to the catheterization laboratory, and prepped and draped in the  usual fashion.  He had been pretreated with oral clopidogrel 600 mg more  than 2 hours before through an anterior puncture of the left femoral  artery was entered and a 7-French sheath was placed.  There was some  tortuosity of the iliacs.  A JR-4 guiding catheter with side holes was  then taken up to the RCA and views obtained.  Heparin was given  according to protocol and ACT checked.  We gave also eptifibatide in  appropriate double-bolus strategy.  Following this, ACT was checked and  found to be appropriate for intervention.  We  initially placed a  Prowater wire in the distal vessel and then used an Over-the-Wire  balloon to exchange for a Rotafloppy wire.  Following this, 1.5-mm bur  was placed in the vessel and following intracoronary nitroglycerin,  multiple passes were performed.  There was a fair amount of resistance  at both lesions, but we were subsequently able to get good passage of  the rotational bur into the distal vessel.  We then predilated with a  2.5-mm balloon at both lesions.  Following this, it required two wires,  but we were able to get a 32 x 3.0 stent across both lesions.  Dilatation was done up to 14-15 atmospheres with good deployment.  This  was removed and then postdilatation was done with a 3.5 [place] Voyager  balloon.  There was a fair amount of calcification, diffuse irregularity  throughout the vessel.  There was a lesion at the crux, but this will  extended into the PDA and it was felt that this was not critical and  would be better deferred at the present time.  The patient did develop  some oozing at the left groin, and  it was difficult to keep his blood  pressure down despite intracoronary nitroglycerin and a nitroglycerin  drip.  Following this, the 7-French sheath was exchanged for an 8-French  sheath with good hemostasis.  He was taken to the holding area in  satisfactory clinical condition.   ANGIOGRAPHIC DATA:  The right coronary artery was diffusely and  segmentally diseased vessel with multiple areas of calcification.  There  was segmental calcified 95% proximal lesion and 90% lesion in the distal  portion of the midvessel.  Both of these areas were covered with a 32-mm  stent with a reduction to essentially 0% residual luminal narrowing.  There was diffuse plaquing before and after the lesions.  There was a 50-  60% lesion prior to the takeoff of the PDA.  The PDA is fairly small in  caliber with about a 70% ostial stenosis.  There was a large  posterolateral segment as  well.  There was TIMI III flow at the  completion of the procedure.   CONCLUSION:  Successful percutaneous stenting of the right coronary  artery with adjunctive rotational atherectomy.   DISPOSITION:  The patient would be treated medically.  He will need  close monitoring.  Follow up will be with Dr. Tenny Craw.      Arturo Morton. Riley Kill, MD, [facility]  Electronically Signed     TDS/MEDQ  D:  07/17/2008  T:  07/18/2008  Job:  [id number]   cc:   Holley Bouche, M.D.  Pricilla Riffle, MD, [facility]

## 2011-01-31 NOTE — Discharge Summary (Signed)
[name], [name]                 ACCOUNT NO.:  [id number]   MEDICAL RECORD NO.:  [id number]          PATIENT TYPE:  INP   LOCATION:  5125                         FACILITY:  [facility]   PHYSICIAN:  Barnetta Chapel, MDDATE OF BIRTH:  08/27/1931   DATE OF ADMISSION:  03/03/2007  DATE OF DISCHARGE:  03/06/2007                               DISCHARGE SUMMARY   PRIMARY CARE PHYSICIAN:  Dr. Tiburcio Pea.   DISCHARGE DIAGNOSIS:  1. Altered mental status, resolved.  2. New onset seizures.  3. Hypertension.  4. History of peripheral ulcerative scleritis.   DISCHARGE MEDICATIONS:  1. Valtrex 500 mg p.o. once daily.  2. Erythromycin eye ointment to be applied four times daily to the      eyes.  3. Cytoxan 150 mg p.o. once daily.  4. Prednisone 20 mg p.o. t.i.d.  5. Lotrel 10/20, one tab p.o. once daily.  6. Bactrim-DS one tab p.o. every other day.   CONSULTATIONS:  Neurology.  The patient was seen by Dr. Sharene Skeans.  Dr  Sharene Skeans performed an EEG.  The EEG was said to be normal.  General  impression is that the seizure may be secondary to cyclosporin.   IMAGING STUDIES:  1. CT scan of the brain.  2. MRI of the brain.  3. EEG as noted above.   BRIEF HISTORY AND HOSPITAL COURSE:  Please refer to the H and P done on  03/04/2007.  The patient is a 77 year old male with past medical history  significant for hypertension and peripheral ulcerative scleritis.  The  patient's eye problem is looked after by an ophthalmologist at [facility], [facility].  The patient was admitted with new onset seizures  and altered mental status.  The patient was on cyclosporin prior to  admission.  Apparently the dose of the cyclosporin was increased just  prior to the seizure episodes.  The patient may have taken a lot more  dosage than she was advised to take.   The patient was admitted to the regular medical floor.  On admission, he  was treated with IV dilantin.  Neurology consult was called and the  patient was seen by Dr. Sharene Skeans.  Dr. Sharene Skeans felt there was no need  to continue the dilantin.  He also advised that there was no need to  pursue lumbar puncture.  It was felt that the seizure episode was  secondary to the cyclosporin.  The hospitalist who cared for the patient  at the time, spoke with the patient's ophthalmologist at [facility],  and it was okay to discontinue the cyclosporin.  The patient is to  continue Cytoxan.  He is also on prednisone.  The patient has remained  stable.  CT scan of the brain, MRI of the brain and the EEG have been  all normal.  The patient will be discharged back to the care of the  primary care Rylin Saez and the ophthalmologist.   DISCHARGE PLAN:  1. Discharge the patient home today.  2. Follow with the primary care Jenice Leiner, Dr. Holley Bouche, in a      week.  3. Follow up with the ophthalmologist, Dr. Judeen Hammans, as soon as possible.  4. Cardiac diet.  5. Activity as tolerated.   The patient has been advised not to drive for six months.      Barnetta Chapel, MD  Electronically Signed     SIO/MEDQ  D:  03/06/2007  T:  03/06/2007  Job:  [id number]   cc:   Holley Bouche, M.D.

## 2011-01-31 NOTE — Consult Note (Signed)
NAMEQUINLIN, Roberto                 ACCOUNT NO.:  [id number]   MEDICAL RECORD NO.:  [id number]          PATIENT TYPE:  INP   LOCATION:  6706                         FACILITY:  [facility]   PHYSICIAN:  Antonietta Breach, M.D.  DATE OF BIRTH:  [birth year]   DATE OF CONSULTATION:  03/12/2007  DATE OF DISCHARGE:                                 CONSULTATION   REFERRING PHYSICIAN:  Kela Millin, M.D.   REASON FOR CONSULTATION:  Psychosis agitation.   HISTORY OF PRESENT ILLNESS:  [name] is a 77 year old male  originally admitted to the [facility] on March 04, 2007, after  developing severe confusion and agitation as well as two new onset tonic-  clonic seizures.   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  He was placed in the  hospital and given high-dose prednisone to resolve a dangerous  inflammatory process that was blinding him.  He was continued on  predinsone 20 mg 3 times a day after discharge along with Cytoxan and  Valtrex.   When he was admitted to [facility] in June, he was seen by neurology  who assessed the likely culprit in triggering the seizures as Cytoxan.   The patient was placed on Dilantin at that time; however, it has since  been discontinued.  The patient's confusion and agitation resolved in  the hospitalization from June 16 through 18 after his prednisone had  been reduced to 20 mg daily.  He went home briefly on prednisone 20 mg  daily.   The patient's wife reports that his mental status never returned to his  pre-May baseline.  He has continued at home to have mood lability with  sudden tears as well as being hyperverbal; however, on June 22, the  patient began having agitation, confusion once again which progressed to  a severity last night of severe insomnia and hyperreligious symbolism in  his speech which was very pressured.   Today the patient has continued with a mild  euphoria and is hyperverbal;  however, his orientation has returned for the most part. Also, his  memory storage and retrieval has returned.  He is not combative.  He is  cooperative and redirectable.  He is taking medication cooperatively.  He has no suicidal thoughts nor has he had any since his mental status  abnormalities started.   The patient's prednisone was stopped upon this admission on June 23.   PAST PSYCHIATRIC HISTORY:  [name] has no history of major  depression, mania, or psychosis.   FAMILY PSYCHIATRIC HISTORY:  None known.   SOCIAL HISTORY:  [name] is married to a very supportive wife.  His  primary care physician is Holley Bouche, M.D.  [name] has no history  of alcohol or illegal drugs.  Occupation:  Retired Medical illustrator.   PAST MEDICAL HISTORY:  Please see the above, hypertension.   ALLERGIES:  No known drug allergies.   MEDICATIONS:  The MAR is reviewed.  The patient is on haloperidol 0.5 mg  to 1 mg q.  6 h p.r.n.   He has not received any Haldol during this admission.   LABORATORY DATA:  A basic metabolic panel is unremarkable.  WBC 3.9,  hemoglobin 11, platelet count 172.  Folic acid normal.  B12 normal.  TSH  normal.  RPR nonreactive.  Ammonia normal. Calcium normal.  Urinalysis  unremarkable.  Liver function panel unremarkable.   MRI of the brain without contrast on June 16 was negative for  intracranial abnormality.   EEG on June 17 was a normal study.   EKG on June 23 showed a QTC of 404 msec.   REVIEW OF SYSTEMS:  CONSTITUTIONAL:  Afebrile.  No weight loss.  NEUROLOGIC:  As above.  PSYCHIATRIC:  As above.  CARDIOVASCULAR:  No  chest pain or palpitations. RESPIRATORY:  No coughing or wheezing.  GASTROINTESTINAL:  No nausea, vomiting, diarrhea.  GENITOURINARY:  No  dysuria.  SKIN:  Unremarkable. ENDOCRINE/METABOLIC:  Unremarkable.  MUSCULOSKELETAL:  No deformities.  HEMATOLOGIC/LYMPHATIC:  Unremarkable.   PHYSICAL EXAMINATION:  VITAL  SIGNS:  Temperature 98.0, pulse 69,  respirations 22, blood pressure 137/75, O2 saturation on room air 96%.  Weight 76.6 kg.  GENERAL APPEARANCE:  [name] is an elderly male lying in a supine  position in his hospital bed with intact grooming and good eye contact.  He does not have any abnormal involuntary movement.   MENTAL STATUS EXAM:  [name] does have mildly pressured speech;  however, he will allow interrupting.  He has normal prosody without  dysarthria.  On orientation testing, he knows the year and month; he  misses the day of month by 4.  He knows his place and person.  Memory:  3/3 verbal, 0/3 verbal at 3 minutes.  Visual 3/3 and 2/3 at 3 minutes.  Fund of knowledge and intelligence are mildly below that of his  estimated premorbid baseline.  His concentration is mildly decreased.  Though process is involving an increased rate and slight tangentiality.  His abstraction and calculation abilities are intact.  Though content:  No thoughts of harming himself, no thoughts of harming others.  No  delusions, no hallucinations.  Affect is mildly euphoric.  Mood is  similar.  Insight is poor for his ongoing increased mood and increased  speech.  His judgment is impaired.   ASSESSMENT:  AXIS I:  1. (293.83) Mood disorder due to general medical condition, likely      prednisone induced.  2. (293.00)  Delirium due to general medical condition, improved.  AXIS II:  None.  AXIS III:  See the general medical problems above.  AXIS IV:  General medical.  AXIS V:  40.   The undersigned provided egosupportive psychotherapy and education.  The  patient agreed with his wife remaining in the room for discussion of  treatment.   The indications, alternatives, and adverse effects of Seroquel were  discussed for antipsychosis and acute mood stabilization including the  risk of cataracts, a nonreversible movement,  hyperglycemia, and over  sedation.  The patient's wife understands and  wants to proceed with  Seroquel if prednisone is needed.   The undersigned also discussed the limited literature on prednisone-  induced mental status aberrations.  Please see the discussion below.   RECOMMENDATIONS:  1. If the patient is kept off of prednisone and his delirium as well      as mood instability does nt resolve, would start Seroquel at 25-50      mg nightly and titrate as tolerated to  approximate dosage of 100-      200 mg nightly.  Would also check the QTC to confirm that it is      still less than 500 msec when increasing Seroquel.  2. Preliminarily, the wife reports that Dr. Eustaquio Maize has told her that an      appropriate course for his eye condition could include Valtrex and      Cytoxan without the prednisone.  3. If this is the case, would have the patient's neurologist and Dr.      Eustaquio Maize discuss the appropriate medication regimen for the future      which might have to involve an anticonvulsant prescribed while the      patient is on Cytoxan.  4. Regarding any future need of steroid treatment for the patient's      condition, the limited literature does not show consistent      correlations between steroids and mental status aberrations in      patients where they are having intermittent steroid courses.      Therefore, if a patient has had mental status abnormalities      associated with one steroid course, that does not predict that he      will have abnormalities on any subsequent steroid courses.      However, the patient may choose to go on prophylactic antipyschosis      when the time for steroid treatment arises.  5. Preliminary discharge planning, if the patient's course does      involve the need for Seroquel due to unresolved mood instability,      psychosis, and though disorganization, would admit him to a      geripsychiatric unit if he is medically cleared before his mental      status stabilizes.  6. If his mental status stabilizes and he requires  Seroquel, would      have him followed up at one of the outpatient psychiatric clinics      attached to [facility], [facility], or [facility].      There are also some private psychiatrists in town that will take      geriatric patients.      Antonietta Breach, M.D.  Electronically Signed     JW/MEDQ  D:  03/12/2007  T:  03/12/2007  Job:  [id number]   cc:   Holley Bouche, M.D.  Dr. Eustaquio Maize, ophthalmologist

## 2011-01-31 NOTE — Discharge Summary (Signed)
[name], [name]                 ACCOUNT NO.:  [id number]   MEDICAL RECORD NO.:  [id number]          PATIENT TYPE:  INP   LOCATION:  6524                         FACILITY:  [facility]   PHYSICIAN:  Arturo Morton. Riley Kill, MD, FACCDATE OF BIRTH:  02-04-1931   DATE OF ADMISSION:  07/17/2008  DATE OF DISCHARGE:  07/18/2008                               DISCHARGE SUMMARY   PRIMARY DIAGNOSES:  1. Multivessel coronary artery disease.      a.     Status post elective Taxus drug-eluting stenting/rotational       atherectomy of right coronary artery.   SECONDARY DIAGNOSES:  1. Recent syncope.      a.     Documented 52nd pause.      b.     Status post permanent pacemaker implantation by Dr. Charlies Constable.  2. Preserved left ventricular function.  3. Hypertension.  4. Hypokalemia.  5. Dyslipidemia.  6. Chronic obstructive pulmonary disease.  7. Cerebrovascular disease.  8. Abnormal chest CT scan.      a.     Nonspecific mildly enlarged mediastinal/hilar nodes of       uncertain significance - recommend followup study in 3-4 months.      b.     Left renal atrophy/hydronephrosis; question cholelithiasis       versus partial gallbladder wall thickening/porcelain gallbladder.   HOSPITAL COURSE:  [name] returned to the hospital for elective  percutaneous intervention following a recent cardiac catheterization  revealing severe ICA disease.  Dr. Riley Kill proceeded with successful  Taxus stenting/rotational atherectomy of tandem, high-grade RCA lesions  with no noted complications.   The patient was cleared for discharge the following morning in  hemodynamically stable condition.  There were no noted complications of  both groin incision sites.  Of note, pacer site remained stable with no  evidence of hematoma, infection, or oozing.   Of note, Dr. Riley Kill conveyed a letter to Dr. Holley Bouche and Dr.  Dietrich Pates with respect to recent abnormal findings by CT angiogram of  the chest.  This  will need to be followed closely as an outpatient.   Of note, arrangements will also need to be made for the patient to  follow up in our pacemaker clinic here in [place] with subsequent  followup with Dr. Charlies Constable.    The patient was given 40 mEq of K-Dur prior to discharge.  Of note,  Diovan was also placed on hold secondary to relative hypotension with a  blood pressure of 108/53.  He had also not received any of his home  medications by time of examination.  He will receive Plavix 75 mg prior  to discharge as well.   DISCHARGE AT DISPOSITION:  Stable.   DISCHARGE LABS:  Sodium 136, potassium 3.4, BUN 16, creatinine 1.2,  glucose 130.  WBC 6.8, hemoglobin 10.4, hematocrit 30.5, platelet 160.   DISCHARGE MEDICATIONS:  1. Plavix 75 mg daily.  2. Coated aspirin 325 mg daily.  3. Diovan 160 mg daily.  4. Zocor 20 mg nightly.  5. Lopressor  25 mg b.i.d.  6. Omega-3 fish oil 2 tablets daily.  7. Eyedrops as previously directed.  8. Temazepam 15 mg nightly.  9. CellCept 500 mg every other day.  10.Nitrostat p.r.n.   FOLLOWUP:  1. Initial followup with Dr. Shawnie Pons on Tuesday, July 21, 2008, at 9:00 a.m. for recheck.  2. Subsequent followup with Dr. Dietrich Pates in 2 weeks.  Arrangements      to be made through our [place] office.  3. Followup with the pacemaker clinic here in [place] in 2 weeks.  4. Subsequent followup with Dr. Charlies Constable in 3 months.      Arrangements to be made through our [place] office.   DICTATION DURATION:  Greater than 30 minutes.      Gene Serpe, PA-C      Arturo Morton. Riley Kill, MD, [facility]  Electronically Signed    GS/MEDQ  D:  07/18/2008  T:  07/18/2008  Job:  [id number]   cc:   Holley Bouche, M.D.  Pricilla Riffle, MD, [facility]

## 2011-01-31 NOTE — Discharge Summary (Signed)
[name], [name]                 ACCOUNT NO.:  [id number]   MEDICAL RECORD NO.:  [id number]          PATIENT TYPE:  INP   LOCATION:  6706                         FACILITY:  [facility]   PHYSICIAN:  Corinna L. Lendell Caprice, MDDATE OF BIRTH:  [birth year]   DATE OF ADMISSION:  03/11/2007  DATE OF DISCHARGE:  03/18/2007                               DISCHARGE SUMMARY   DISCHARGE DIAGNOSES:  1. 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  2. Peripheral ulcerative scleritis.  3. Hyponatremia.  4. History of seizures, thought to be secondary to cyclosporin in the      setting of hyponatremia.  5. Hypertension, blood pressure normal off his antihypertensives.  6. Mild bilateral pneumonia.   DISCHARGE MEDICATIONS:  1. He is to stop prednisone,  2. Stop cyclosporin.  3. Seroquel has been started, 150 mg p.o. q.h.s., hold for sedation.      I suspect he will be able to taper off Seroquel.  4. Haldol 1 mg p.o. q.h.s. and q.6 hours p.r.n. agitation.  I suspect      he will be able to stop this soon.  5. He is to continue Valtrex 500 mg a day for now, but as long as      immunosuppressants are not resumed he can come off this I suspect,      defer to Dr. Eustaquio Maize.  6. Continue Bactrim DS one tab every other day although I suspect this      can be stopped if immunosuppressants are not resumed.  7. Stop Actonel.  8. Resume Diovan 160 mg a day.  9. Continue erythromycin ointment q.i.d. to the right eye as previous.  10.Cefuroxime 500 mg p.o. b.i.d. until gone.   CONDITION:  Stable.   ACTIVITY:  Ad-lib   He is to follow up with Dr. Eustaquio Maize tomorrow or as soon as possible.  Follow up with Dr. Leonides Sake within a week to monitor his sodium and  possible tapering of Seroquel and Haldol.   CONSULTATIONS:  1. Dr. Jeanie Sewer  2. Dr. Allen Norris   PROCEDURES:  None.   LABS:  CBC on admission was unremarkable.  Sodium on admission was 134,  at discharge was 130.  He will need a repeat sodium check.  The rest  of  his basic metabolic panel unremarkable.  Liver function tests  unremarkable.  Ammonia level 14.  TSH 1.8.  B12 863.  Folate greater  than 20.  Urinalysis:  Negative nitrite, negative leukocyte esterase.  Point-of-care enzymes negative.   SPECIAL STUDIES AND RADIOLOGY:  Two views of the chest on June 23 showed  nothing acute.   Left rib film on June 25 showed no rib fracture, bibasilar airspace  disease.   Two views of the chest on June 25 showed new patchy densities at the  lung bases worrisome for mild pneumonia.   HISTORY/HOSPITAL COURSE:  [name] is a 77 year old white male who had  been on prednisone prior to admission for peripheral ulcerative  scleritis.  He had confusion and altered mental status and was brought  in by his wife.  His cyclosporin had  recently been stopped due to new-  onset seizures which caused a hospitalization earlier in the month.  The  wife also reports that Cytoxan had been stopped as well; I cannot  confirm this, but this was not resumed during his hospitalization.  Please see H&P for complete admission details.  He had pressured speech,  he had flight of ideas, he was oriented, but very tangential and manic.  He also displayed some paranoia during his hospitalization and confusion  like pulling out IVs and trying to flush his clothes down the toilet.  His prednisone had been stopped and psychiatry was consulted.  Dr.  Jeanie Sewer recommended Seroquel.  I discussed the case with Dr. Eustaquio Maize who  recommended trying to start the cyclosporin back with the hopes that his  previous seizure was caused by cyclosporin in the setting of  hyponatremia;  now that his sodium has improved this would not be an  issue.  This was started back carefully, but patient's confusion  worsened and so this was stopped again.  A level was drawn and was on  the high side of normal at 314.  Dr. Eustaquio Maize had also recommended trying a  smaller dose of prednisone just 10 mg, but  again the psychosis worsened  so this was stopped.  His mania and confusion was difficult to stabilize  and he required escalating doses of both Haldol and Seroquel with the  assistance of psychiatry.  His EKG showed no acute T prolongation.  He  had no seizures either.  During his hospitalization, he had complained  of left rib pain and was concerned that when he had fallen previously he  had broken a rib.  Rib films showed no rib fracture, nor was he tender  over the ribcage, but it did show an infiltrate.  PA and lateral chest x-  ray confirm bibasilar infiltrates and, I suspect, he had pleurisy.  He  was started on azithromycin and Rocephin; strangely enough he had no  fever, leukocytosis or cough, but his symptoms did improve with this  treatment.  He has completed his course of azithromycin and just has two  more days of Ceftin left.  He has been on oral antibiotics without any  problems.  Finally today, his mental status is approaching baseline  according to family members.  I will send him home on Haldol and  Seroquel, but I suspect as this episode clears, we will need to taper  off the Seroquel and Haldol, and I stressed the importance with close  followup with both Dr. Tiburcio Pea for this reason and to also check the  sodium, and to follow up with Dr. Eustaquio Maize to see whether alternate  medications would be needed for his eye condition.   Total time on the day of discharge is 50 minutes.      Corinna L. Lendell Caprice, MD  Electronically Signed     CLS/MEDQ  D:  03/18/2007  T:  03/18/2007  Job:  [id number]   cc:   Dr. Herschel Senegal, M.D.

## 2011-01-31 NOTE — Assessment & Plan Note (Signed)
[facility]                            CARDIOLOGY OFFICE NOTE   NAME:Bleicher, NIK GORRELL                        MRN:          [id number]  DATE:07/27/2008                            DOB:          Dec 03, 1930    IDENTIFICATION:  Mr. Echeverri is a 77 year old gentleman who was discharged  from [facility] on July 18, 2008, from PTCA/stent (drug-eluting  with rotational atherectomy) to the RCA.  Previously, the patient had a  pacemaker implanted by Dr. Charlies Constable.  He was seen by Dr. Shawnie Pons just last week.  Plan was for followup today to confirm he was  still doing well.  The labs drawn last week showed a hemoglobin of 11.1.  BUN and creatinine of 18 and 1.2.   Since seen, the patient denies dizziness.  Breathing is good.  No chest  pain, no shortness of breath, and no fevers.   CURRENT MEDICINES:  1. Plavix 75.  2. Aspirin 325.  3. Diovan 160.  4. Fish oil.  5. Zocor 20.  6. Metoprolol 25 b.i.d.  7. Temazepam 15.  8. CellCept 500 every other day.  9. Vitamin, eye vitamin daily.  10.Calcium Caltrate.  11.Multivitamin.  12.Dorzolamide right eye b.i.d.  13.Durezol to the right eye b.i.d.   PHYSICAL EXAMINATION:  GENERAL:  The patient is in no distress.  VITAL SIGNS:  Blood pressure is 114/68, pulse is 62 and regular, weight  is 168, stable.  NECK:  No bruits.  LUNGS:  Clear.  CARDIAC:  Regular rate and rhythm, S1 and S2.  No S3 and no significant  murmurs.  CHEST:  Pacer site clean and dry.  No significant hematoma.  ABDOMEN:  Benign.  No hepatomegaly.  EXTREMITIES:  No edema.   A 12-lead EKG shows dual-chamber pacing, sinus rhythm.   IMPRESSION:  1. Coronary artery disease, clinically doing very well.  I would keep      him on the same regimen, told him again to not run out of the      Plavix.  2. History of syncope, status post pacer.  Again, we will need      followup for his pacemaker interrogation.  3. Dyslipidemia on Zocor  now.  We will need again followup labs.  I      will check these when I see him again in early January.   ADDENDUM:  The patient had CT while in the hospital.  There was a  question of renal pathology.  The patient will follow up with Dr.  Tiburcio Pea.  I have given him a copy of the most recent clinic note and labs  for his appointment.     Pricilla Riffle, MD, [facility]  Electronically Signed    PVR/MedQ  DD: 07/27/2008  DT: 07/28/2008  Job #: [id number]   cc:   Holley Bouche, M.D.

## 2011-01-31 NOTE — Letter (Signed)
July 18, 2008    Holley Bouche, M.D.  [address]  [place], [place] [address]   Pricilla Riffle, MD, [facility]  [address]  [place], [place] [address]   RE:  Roberto, Romero  MRN:  [id number]  /  DOB:  31-Dec-1930   Dear Dr. Tiburcio Pea and Gunnar Fusi,   Your nice patient, [name] was readmitted to the hospital.  He was  admitted by the [facility] about a week ago, developed a  long pause, and subsequently underwent emergency percutaneous permanent  pacemaker implantation by Dr. Juanda Chance after we had done cardiac  catheterization.  The consensus opinion at that time was that he had  critical disease of the right coronary artery, and he was brought back  yesterday for percutaneous rotational atherectomy and stenting of the  right coronary artery.  This procedure was done uneventfully, and he has  done well.  We are making arrangements for him to be discharged today,  and I will see him back briefly on Tuesday for a groin check until he  can be seen by Gunnar Fusi in the office.   Of note, with his original admission there was a CT scan performed.  This demonstrated no evidence of pulmonary embolic disease, but  nonspecific mildly enlarged mediastinal and hilar lymph nodes of  uncertain significance, and a follow-up study was recommended in 3-4  months.  In addition, despite his preserved renal function, there was  evidence of left renal atrophy and evidence of hydronephrosis.  There  was also a question of cholelithiasis versus partial  gallbladder wall thickening or porcelain gallbladder.  All of these  things likely need to be looked into in more detail.   I wanted to bring these things to your attention.  He will be seeing  both of you in follow-up, so hopefully this will be addressed.    Sincerely,      Roberto Morton. Riley Kill, MD, [facility]  Electronically Signed    TDS/MedQ  DD: 07/18/2008  DT: 07/18/2008  Job #: [id number]

## 2011-01-31 NOTE — Letter (Signed)
July 21, 2008    [name], M.D.  [address]  [place], [place] [address]   RE:  Roberto, Romero  MRN:  [id number]  /  DOB:  06/24/1931   Dear Dr. Tiburcio Romero:   We had [name] return to the office today in a followup visit.  To  briefly summarize, he was initially admitted by the General Medicine, I  had a long pause, and underwent urgent catheterization as well as  temporary pacer implantation.  Importantly, with this endeavor, he had  evidence of high-grade RCA disease that was heavily calcified.  He also  has fairly significant disease of the left coronary system, but not high-  grade proximal LAD disease.  The case was carefully reviewed by Dr.  Graciela Romero, Dr. Juanda Romero, and myself, and the plan at that time was for him to  go ahead and have a permanent pacer implantation.  That was done without  complication.  He was subsequently sent home, and brought back for a  high-speed rotational atherectomy with adjunctive stenting of the right  coronary artery which was done this past Friday.  His pacer site looks  good, and he has done well from that standpoint.  His percutaneous  intervention was uncomplicated, and it was felt that he could be treated  medically.  Since discharge from the hospital, he has gotten along just  fine.  He has had no change in the pacemaker, and he is on dual  antiplatelet therapy.  Importantly, while he was in the hospital, it  should also be noted that during his original hospitalization he had an  abnormal CAT scan.  This abnormal CAT scan has several findings.  These  include some lymph nodes.  He also has thickened gallbladder, suggested  the possibility of a porcelain gallbladder.  Moreover, there is atrophy  of the left kidney as well as hydronephrosis.  The exact etiologies of  any of these findings are unclear.  Nonetheless, I was concerned about  them, and I have dictated a letter both to you and Dr. Tenny Romero regarding  both of these.  He  apparently has seen Dr. Aldean Romero for something in  the past, although I do not have these records, and we attempted to call  your office this morning.  Importantly, I had him come in just because  of the extent of the procedure and until he could see Dr. Tenny Romero next  week.   His current medications include:  1. Plavix 75 mg daily.  2. Enteric-coated aspirin 325 mg daily.  3. Diovan 160 daily.  4. Fish oil daily.  5. Zocor 20 mg nightly.  6. Metoprolol 25 mg p.o. b.i.d.  7. Temazepam 5 mg nightly.  8. CellCept 500 mg every other day.  9. Vitamin daily.  10.Calcium Caltrate daily.  11.Multivitamin daily.  12.Brinzolamide eye drops b.i.d.  13.Durezol in the right eye.   On physical examination, he is alert and oriented in no distress.  The  blood pressure is 136/70 the pulse is 66 and regular.  The lung fields  are clear to auscultation and percussion.  There is no cardiac rub.  The  pacer site looks bruised, but is intact without obvious hematoma.   The electrocardiogram demonstrates atrioventricular pacing.   I have put a call into your office and I believe you will be there this  afternoon.  We will discuss further his situation.  We have made him an  appointment to see Dr. Dietrich Romero  in the near future.  He will continue  to follow up.  I did review his angiograms with him today, and he will  need an aggressive approach to risk factor reduction to prevent  progression of his three-vessel disease.  Hopefully, he will continue to  improve.   I appreciate the opportunity of sharing in his care.  He will likely  need neurologic followup as well as an opinion regarding his gallbladder  perhaps from a general surgeon.  We will leave these to your discretion.    Sincerely,     [name]. Roberto Kill, MD, [facility]  Electronically Signed   TDS/MedQ  DD: 07/21/2008  DT: 07/21/2008  Job #: [id number]   CC:    Roberto Riffle, MD, [facility]

## 2011-01-31 NOTE — H&P (Signed)
[name], [name]                 ACCOUNT NO.:  [id number]   MEDICAL RECORD NO.:  [id number]          PATIENT TYPE:  INP   LOCATION:  6706                         FACILITY:  [facility]   PHYSICIAN:  Kela Millin, M.D.DATE OF BIRTH:  06/22/1931   DATE OF ADMISSION:  03/11/2007  DATE OF DISCHARGE:                              HISTORY & PHYSICAL   PRIMARY CARE PHYSICIAN:  Noberto Retort, M.D.   CHIEF COMPLAINT:  Confusion/altered mental status.   HISTORY OF PRESENT ILLNESS:  The patient is a 77 year old white male  with a past medical history significant for recent hospitalization at  [facility] on June 15 for altered mental status, new-onset  seizures, history of hyponatremia, hypertension, and peripheral  ulcerative scleritis, who presents with the above complaints, per wife.  The history is obtained from wife secondary to patient's altered mental  status and also his E-chart records.  The wife reports that the patient  had been diagnosed with peripheral ulcerative scleritis by an  ophthalmologist at [facility], Dr. Francene Castle.  His treatments included  cyclosporin, Cytoxan, prednisone, Xaltrex, and ophthalmic ointments.  Just shortly after his ophthalmologist changed his cyclosporin dose, his  wife reports that the patient had new-onset seizures and was  hospitalized from June 15 through June 18.  From review of his E-chart  records during that hospital stay, the patient had a CT scan as well as  an MRI.  This did not show any acute findings.  Neurology was consulted,  and their impression was that the new-onset seizures were secondary to  cyclosporin, and it was discontinued.  Per E-chart records, the patient  was discharged on prednisone 20 t.i.d., although wife states that she  understood that he was to take 20 mg daily and that is what he had been  on since he was discharged.  His wife states that after talking with the  patient's ophthalmology office, the Cytoxan had  also been held until his  pending follow-up appointment.  The patient, per wife, was doing well  until the day prior to admission when he became very confused, unable to  focus, with flight of ideas, and per family and friends, was not  making sense when speaking.  No fevers, cough, dysuria, diarrhea,  melena, hematochezia, or chest pain reported, per wife.   The patient was seen in the ER, and urinalysis was negative for  infection.  His chemistries were unremarkable.  He is admitted to [facility] service for further evaluation and management.   PAST MEDICAL HISTORY:  As stated above.   MEDICATIONS:  1. Valtrex 500 mg p.o. daily.  2. Erythromycin ointment q.i.d., per recent discharge summary,      although wife states that he uses this p.r.n. at home.  3. Prednisone 20 mg daily (per E-chart, patient was to be taking it      t.i.d.).  4. Bactrim DS 1 p.o. daily.  5. Actonel 5 mg daily.  6. Diovan 160 mg daily.  7. Per wife, the Cytoxan had been stopped and also cyclosporin had  been stopped during his recent hospitalization.   ALLERGIES:  NKDA.   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  He denies  tobacco.  He denies alcohol.   FAMILY HISTORY:  Noncontributory.   REVIEW OF SYSTEMS:  As per HPI, otherwise unobtainable.   PHYSICAL EXAMINATION:  GENERAL:  The patient is an elderly white male.  He is alert, very talkative.  He is oriented x3, although in addition to  answering the questions, he just adds on a lot of other unrelated  information.  VITAL SIGNS:  Temperature 98.1, blood pressure 172/80, pulse 100,  respiratory rate 20, O2 sat 98%.  HEENT:  PERRL.  EOMI.  Sclerae are anicteric.  Slightly dry mucous  membranes.  No oral exudates.  NECK:  Supple.  No adenopathy.  No thyromegaly.  No JVD.  LUNGS:  Clear to auscultation bilaterally.  No crackles or wheezes.  CARDIOVASCULAR:  Regular rate and rhythm.  Normal S1 and S2.  ABDOMEN:  Soft  with normal bowel sounds present.  Nontender,  nondistended.  No organomegaly.  No masses palpable.  EXTREMITIES:  No cyanosis and no edema.  NEURO:  Alert and oriented x3.  Cranial nerves II-XII grossly intact.  He follows commands.  PSYCH:  He is unable to concentrate, flight of ideas present.  Flat  affect.  He also perseverates.   LABORATORY DATA:  Sodium 134, potassium 3.6, chloride 102, BUN 18,  creatinine 106.  The pH is 7.37, pCO2 47, bicarb 27.4.  His white cell  count is 5.3 with a hemoglobin of 13.7, hematocrit 40.3, platelet count  193, neutrophil count 72%.  Point-of-care markers are negative.  His AST  is 27, ALT 39, alkaline phosphatase 74.  Total bilirubin is 0.6.  Urinalysis is negative for infection.  His calcium is 8.5.  Creatinine  is 1.2.   ASSESSMENT/PLAN:  1. Confusion/altered mental status, likely secondary to      medication/question of steroid-induced psychosis.  As noted above,      CT and MRI from June 15 admission negative.  Will hold prednisone      (patient has been on prednisone since Feb 11, 2007 and has been      tapered down.  For the past five days, he has taken 20 mg daily).      I have put in a call to his ophthalmologist, Dr. Francene Castle, for      recommendations regarding further prednisone.  I will obtain workup      for reversible process of dementia and follow.  Will also place the      patient on Haldol p.r.n.  Consult psychiatry for further      recommendations.  2. History of peripheral ulcerative scleritis:  As discussed above,      awaiting call back from patient's ophthalmologist regarding      recommendations on his medications.  3. Hypertension, uncontrolled:  Continue outpatient medications and      monitor blood pressures.  4. History of seizure disorder:  Per recent discharge summary,      neurology impression was that this was secondary to cyclosporin.     Kela Millin, M.D.  Electronically Signed     ACV/MEDQ  D:   03/12/2007  T:  03/13/2007  Job:  [id number]   cc:   Melida Quitter, M.D.

## 2011-01-31 NOTE — H&P (Signed)
[name], [name]                 ACCOUNT NO.:  [id number]   MEDICAL RECORD NO.:  [id number]          PATIENT TYPE:  INP   LOCATION:                               FACILITY:  [facility]   PHYSICIAN:  Arturo Morton. Riley Kill, MD, FACCDATE OF BIRTH:  1931-09-15   DATE OF ADMISSION:  07/17/2008  DATE OF DISCHARGE:  07/18/2008                              HISTORY & PHYSICAL   CHIEF COMPLAINT:  Passing out.   HISTORY OF PRESENT ILLNESS:  Please refer to the consult note of Dr.  Dietrich Pates and also to the discharge summary by Dr. Ramiro Harvest on  July 12, 2008.  To briefly summarize, [name] is a very pleasant 77-  year-old gentleman who recently presented with an episode of syncope.  The patient was noted to have a prolonged pauses.  He was seen in  consultation by Dr. Tenny Craw.  When the patient was originally admitted, he  underwent a CT angiogram to rule out pulmonary embolic disease.  He had  a number of other findings by CT.  As a result, he was admitted for  monitoring.  He had a prolonged pause, and subsequently urgent cardiac  catheterization was recommended.  At that time, I placed a temporary  transvenous pacer in his femoral vein, and also did diagnostic cardiac  catheterization.  Dr. Graciela Husbands, Dr. Juanda Chance, and I all reviewed the films  and discussed the options and the strategies at that point.  We were  worried about the high-grade critical RCA disease, but with the  prolonged pause, felt the first order of business was to place a  permanent pacemaker.  It was felt unlikely that the RCA intervention  would resolve the potential for need for pacing.  A permanent pacemaker  was subsequently placed by Dr. Juanda Chance.  He had an uncomplicated course  and was subsequently discharged with plans to return in about a week for  a repeat catheterization and probable intervention with rotational  atherectomy of the heavily calcified coronary.  He has done well since  discharge from the hospital.  He  has not had any chest pain.  He is here  now for percutaneous intervention of critical RCA disease.   PAST MEDICAL HISTORY:  1. Remarkable for symptomatic bradycardia with syncope now 1 week      following permanent pacemaker placement.  2. Three-vessel coronary artery disease with tandem 90-95% stenosis in      the RCA is noted.  3. Orthostasis.  4. Hypertension.  5. Hypokalemia, resolved.  6. An ulcerative scleritis being followed at [facility] by      Ophthalmology and on CellCept.  7. History of questionable seizure disorder.   His medications at discharge included:  1. Zocor 20 mg daily.  2. Aspirin 325 mg daily.  3. Lopressor 25 mg p.o. b.i.d.  4. Diovan 160 mg daily.  5. CellCept 500 mg p.o. every other day.  6. Temazepam 15 mg nightly and 15 mg at 3 a.m.  7. Fish oil 1000 mg daily.  8. Vitamin health daily.  9. Caltrate daily.  10.Centrum Silver  daily.  11.Dorzolamide b.i.d.   ALLERGIES:  The patient has poor reaction to CYCLOSPORIN.   SOCIAL HISTORY:  The patient is married.  He has a wife with bipolar  disorder and he does not smoke or drink.   FAMILY HISTORY:  At the present time is noncontributory.   Brief physical examination revealed an alert gentleman in no distress.  The blood pressure today was 190/90 initially with a pulse of about 70.  The respirations were 18 and unlabored and temperature was afebrile.  The lung fields were clear.  The cardiac rhythm was regular with an S4  gallop.  The pacemaker site looked relatively well healed with some mild  bruising, but no hematoma.  The abdomen was soft.  The neck reveals  bilateral bruits.  No hepatomegaly was noted.  Distal pulses were intact  with some bruising in the right femoral area.   Laboratory studies are intact.   IMPRESSION:  1. Coronary artery disease with high-grade right coronary artery      disease as noted above.  2. Recent pacer implantation.  3. Abnormal CT.  4. History of  hypertension.  5. Ulcerative scleritis.  6. History of seizure disorder.   PLAN:  I have discussed the options of treatment with the patient.  Dr.  Juanda Chance, Dr. Graciela Husbands, and I have all been involved in the recommendations.  Risks, benefits, and alternatives have been discussed.  Questions  answered.  The plan is to proceed with percutaneous coronary  intervention after loading with Plavix.      Arturo Morton. Riley Kill, MD, [facility]  Electronically Signed     TDS/MEDQ  D:  07/17/2008  T:  07/18/2008  Job:  [id number]

## 2011-01-31 NOTE — H&P (Signed)
[name], [name]                 ACCOUNT NO.:  [id number]   MEDICAL RECORD NO.:  [id number]          PATIENT TYPE:  EMS   LOCATION:  MAJO                         FACILITY:  [facility]   PHYSICIAN:  [name], MDDATE OF BIRTH:  [birth year]   DATE OF ADMISSION:  07/09/2008  DATE OF DISCHARGE:                              HISTORY & PHYSICAL   PRIMARY CARE PHYSICIAN:  Dr. Tiburcio Pea.   CHIEF COMPLAINT:  Syncope.   HISTORY OF PRESENT ILLNESS:  The patient is 77 year old gentle with  history of hypertension who was at his baseline of health when he was  feeling like he was slightly warm and flushed.  He decided to go home  and was feeling more or less okay, but then went to [facility] to get a  prescription filled.  While waiting in line, he felt slightly  presyncopal and sat down and reported passed out, though he cannot  remember this.  EMS came in, but he recovered rather rapidly and was  feeling back to his normal self and refused to go, but shortly  thereafter passed out again at which point EMS brought him into  emergency department.  Of note, reportedly per EMS his orthostatics were  unremarkable.   PAST MEDICAL HISTORY:  1. Hypertension.  2. History of psychosis.  3. Peripheral ulcerative scleritis for which he continues to take      CellCept.  4. History of seizure disorder in the past thought to be secondary to      cyclosporin in a setting of hyponatremia, hypertension.   ALLERGIES:  THE PATIENT HAD A POOR REACTION TO CYCLOSPORIN THE PAST.   CURRENT MEDICATIONS:  1. CellCept 500 mg every other day.  2. Eye drops.  3. Diovan 160 mg daily.   SOCIAL HISTORY:  The patient never smoked or drank.  Does not abuse  drugs.  Lives at home.  Lives with wife who has bipolar disorder.   REVIEW OF SYSTEMS:  The patient denies any fevers, chills.  No chest  pain.  No shortness of breath.  Overall, has been feeling fairly well.  He did loose 10 pounds that he says as intentional  over the past year or  so.   PHYSICAL EXAMINATION:  VITALS:  Temperature 97.8, heart rate 78, now up  to 101, up to 112.6, blood pressure on presentation was 59/81, now up to  190s systolics, respirations 18, sating 100% on room air.  GENERAL:  The patient appears to be in no acute distress, lying down in  bed.  HEAD:  Nontraumatic.  Moist mucous membranes.  LUNGS:  Clear to auscultation bilaterally.  HEART:  Rapid but regular.  No murmurs appreciated.  ABDOMEN:  Soft, nontender, nondistended.  LOWER EXTREMITIES:  No clubbing, cyanosis or edema.  Cranial nerves II-  XII intact.  Strength 5/5 in all four extremities.   LABORATORY DATA:  White blood cell count 11.4, hemoglobin 13.7,  platelets 208, D-dimer elevated at 2.59, sodium 135, potassium 3.90,  creatinine 1.31.  LFTs within normal.  Cardiac enzymes within normal  limits.  Three to six  white blood cells, otherwise unremarkable.  EKG  showing normal sinus rhythm.  Chest x-ray showing questionable COPD but  otherwise no abnormality.  None to date.  Currently, the patient is  supposed to undergo CT angiogram to rule out PE which is not back yet.   ASSESSMENT AND PLAN:  1. This is a 77 year old gentleman with syncope of unclear etiology.      Given elevated D-dimer, will await results of CT angiogram of      chest, rule out PE.  Admit on tele, unless there is a massive PE      there.  Will cycle cardiac enzymes, obtain fasting lipid panel,      hemoglobin A1c, especially given slightly elevated glucose 2207.      Check TSH.  The patient has remote history of seizure disorder.  No      seizure activity was witnessed, but could be incomplete.  Will      order EEG.  Will check 2-D echo and carotid Dopplers.  2. Leukocytosis.  Mild, no etiology so far.  Will continue to monitor.  3. Slight apparent orthostasis.  Will give gentle IV hydration.  4. History of scleritis, continue CellCept and check CellCept level.  5. Prophylaxis.   Protonix and Lovenox.      [name], MD  Electronically Signed     AVD/MEDQ  D:  07/09/2008  T:  07/10/2008  Job:  [id number]   cc:   Dr. Tiburcio Pea

## 2011-01-31 NOTE — Discharge Summary (Signed)
NAMETREAVON, CASTILLEJA                 ACCOUNT NO.:  [id number]   MEDICAL RECORD NO.:  [id number]          PATIENT TYPE:  INP   LOCATION:  2915                         FACILITY:  [facility]   PHYSICIAN:  Ramiro Harvest, MD    DATE OF BIRTH:  1931-07-18   DATE OF ADMISSION:  07/09/2008  DATE OF DISCHARGE:  07/12/2008                               DISCHARGE SUMMARY   PRIMARY CARE PHYSICIAN:  Holley Bouche, MD, of [facility].   DISCHARGE DIAGNOSES:  1. Symptomatic bradycardia with syncope.  2. Three-vessel coronary artery disease with 90-95% stenosis of the      right coronary artery.  3. Orthostasis.  4. Hypertension.  5. Hypokalemia.  6. Ulcerative scleritis, being followed at [facility] by      Ophthalmology and tapering down on CellCept.  7. History of seizure disorder, question secondary to medications      (cyclosporin in the setting of hyponatremia).  8. Questionable history of psychosis.   DISCHARGE MEDICATIONS:  1. Zocor 20 mg p.o. daily.  2. Aspirin 325 mg p.o. daily.  3. Lopressor 25 mg p.o. b.i.d.  4. Diovan 160 mg p.o. daily.  5. CellCept 500 mg p.o. every other day.  6. Temazepam 15 mg nightly and 15 mg at 3 a.m.  7. Fish oil 1000 mg p.o. daily.  8. Vitamin eye health daily.  9. Calcium Caltrate daily.  10.Centrum Silver daily.  11.Dorzolamide b.i.d. to the right eye.   DISPOSITION AND FOLLOWUP:  The patient will be discharged home.  The  patient will be called by [facility] for followup for outpatient  PCI on Friday, July 17, 2008.  A basic metabolic profile will need to  be checked to follow up on the patient's electrolytes.   CONSULTATIONS DONE:  [facility] consult was done.  The patient  was seen in consultation by Dr. Dietrich Pates of [facility] on  July 10, 2008.   PROCEDURES PERFORMED:  1. A chest x-ray was performed on July 09, 2008, that showed      chronic obstructive pulmonary disease with mild chronic  interstitial changes, no acute abnormalities.  CT angiogram of the      chest was done on July 10, 2008, that showed no evidence of      pulmonary embolic disease, nonspecific mildly enlarged mediastinal      and hilar nodes of uncertain significance, can establish ability on      followup study in 3-4 months, small hiatal hernia with node of      splenic and questionable hepatic cyst, left renal atrophia and      hydronephrosis, question cholelithiasis versus partial gallbladder      wall thickness/porcelain gallbladder, posterior right upper chest      wall lipoma.  2. A chest x-ray done on July 11, 2008, showed uncomplicated left      subclavian pacemaker insertion, no pneumothorax, mild cardiomegaly,      and emphysema.  A permanent pacemaker was placed on July 10, 2008.  Cardiac catheterization was done on July 10, 2008, by Dr.  Stuckey, which showed significant three-vessel coronary artery      disease with significant disease of the right coronary artery,      preserved left ventricular function and sinus arrest.  3. A 2-D echo was performed on July 10, 2008, which showed overall      left ventricular systolic function with normal EF of 60-65%.  4. Carotid Dopplers were done on July 10, 2008, that showed in the      right proximal internal carotid artery, there was a plaque      identified resulting 60-79% stenosis.  In the left proximal      internal carotid artery, there was plaque identified resulting 60-      79% stenosis.  In the right external carotid artery, there was      plaque identified resulting in the stenosis.  In the left external      carotid artery, there was plaque identified resulting in a      stenosis.  Vertebral artery flow is antegrade bilaterally.  An EEG      was obtained on July 10, 2008, that showed a normal study in      awake and drowsy state.   BRIEF HOSPITAL HISTORY AND PHYSICAL:  Mr. Abb Gobert is a 77 year old   gentleman with a history of hypertension, who was at his baseline of  health when he was feeling like he was slightly warm and flushed,  decided to go home, was feeling more or less okay, but then went to  [employer] to get a prescription filled.  While waiting in line, he felt  slightly presyncopal and sat down and reportedly passed out though he  could not remember this, EMS came in, but after he recovered rather  rapidly, he was feeling back to his normal self.  He refused to go with  the EMS, but shortly thereafter passed out again at which point EMS  brought him into the emergency department.  Of note, reportedly per EMS,  his orthostatics were unremarkable.   PHYSICAL EXAMINATION PER ADMITTING PHYSICIAN:  VITAL SIGNS:  Temperature  97.8, pulse of 78 up to 101 and up to 112, blood pressure on  presentation was 59/81 and up to 190 systolic, respirations of 18, and  sating 100% on room air.  GENERAL:  The patient in no acute distress, lying down on bed.  HEENT:  Normocephalic and atraumatic.  Pupils are equal, round, and  reactive to light.  Extraocular movements intact.  Oropharynx was clear.  No lesions.  No exudates.  Moist mucous membranes.  LUNGS:  Clear to auscultation bilaterally.  CARDIOVASCULAR:  Tachycardic, but regular.  No murmurs appreciated.  ABDOMEN:  Soft, nontender, and nondistended.  Positive bowel sounds.  EXTREMITIES:  No clubbing, cyanosis, or edema.  NEUROLOGIC:  The patient was alert and oriented x3.  Cranial nerves II  through XII were grossly intact.  No focal deficits.  5/5 in all 4  extremities strength.   ADMISSION LABORATORIES:  CBC:  White count 11.4, hemoglobin 13.7, and  platelets 208.  D-dimer elevated at 2.59.  Sodium 135, potassium 3.9,  and creatinine 1.31.  LFTs were within normal range.  Cardiac enzymes  within normal limits, 3-6 white blood cells, otherwise unremarkable.  EKG showed normal sinus rhythm.  Chest x-ray showing questionable COPD,   but otherwise no abnormality.  Today, the patient was getting a 75 CT angio  to rule out a PE, which was pending at the time of admission.  HOSPITAL COURSE:  1. Symptomatic bradycardia with syncope.  The patient was admitted      initially into the hospital with syncope.  Syncope workup was      started.  During his admission, a D-dimer was initially elevated      and as such a CT angiogram was obtained to rule out a PE, which      came back negative for a PE.  Hemoglobin A1c was obtained, which      came back at 5.6.  Cardiac enzymes were cycled, which were negative      x3.  A TSH was also obtained, which came back at 3.802.  Also, an      EEG was obtained, which came back normal.  Fasting lipid panel was      also obtained, which came back with total cholesterol of 159, HDL      of 43, and LDL of 109.  During the hospital day #1 on admission,      the patient while on telemetry, had a 14-second pause, was      transferred to the CCU.  Cardiology was consulted on that evening.      On July 10, 2008, the patient was seen in consultation by Dr.      Tenny Craw, Dr. Graciela Husbands, and was involve, and Dr. Juanda Chance was also involved.      The CT angiogram that was obtained showed heavy calcification in      the coronaries and as such felt that the patient would benefit from      a cardiac catheterization.  A permanent pacemaker was placed on      July 10, 2008, per Dr. Juanda Chance as it was felt the patient had a      symptomatic bradycardia leading to his syncope.  Cardiac cath was      also obtained with results as stated above.  The patient remained      asymptomatic for the rest of the hospitalization.  Denied any chest      pain or shortness of breath.  The patient was paced with the      pacemaker that was placed prior to the permanent pacemaker      placement.  The temporary pacer was placed, and the patient was      placed during the hospitalization.  The patient remained in stable      and  improved condition.  A 2-D echo was also obtained with results      as stated above.  EEG came back negative.  The patient was placed      on aspirin, Benicar, and Lopressor.  Lopressor was increased for      better blood pressure control.  It was felt that the patient did      have severe three-vessel coronary artery disease, on      catheterization, and it was felt the patient will follow up as an      outpatient for PCI.  The patient remained in stable and improved      condition, and the patient will be discharged home for followup      with Cardiology on Friday, July 17, 2008.  2. Three-vessel coronary artery disease with 90-95% stenosis of the      RCA.  This was noted on cardiac catheterization.  The patient was      maintained in house on aspirin, Benicar, and Lopressor.  The      patient's Lopressor was titrated  up to 25 b.i.d.  It was felt the      patient was stable for discharge, and the patient will follow up      within a week for a PCI as an outpatient on Friday, July 17, 2008.  The patient remained asymptomatic and stable for the rest of      the hospitalization, was discharged home in stable and improved      condition.  Zocor of 20 mg was added to the patient's regimen as      his LDL was 109 to achieve LDL goal of less than 70.  3. Orthostasis.  The patient was noted to be orthostatic on admission.      The patient was hydrated with IV fluids and his orthostasis      resolved.  4. Hypokalemia.  The patient during the hospitalization was noted to      be hypokalemic.  His potassium was repleted, and this will need to      be followed up as outpatient.  The rest of the patient's chronic      medical issues were stable throughout the hospitalization.  The      patient was maintained on his home regimen of medications.   On day of discharge, vital signs; temperature 98.4, pulse of 80, blood  pressure 153/61, respiratory rate 18, sating 96% on room air.    DISCHARGE LABORATORIES:  Sodium 136, potassium 3.4, chloride 106, bicarb  26, BUN 13, creatinine 1.21, glucose of 118, and calcium of 8.3.  CBC:  White count 9.9, hemoglobin 12.0, platelets of 154, and hematocrit of  35.5.  TSH of 3.802.   It was a pleasure taking care of Mr. Eathen Budreau.       Ramiro Harvest, MD  Electronically Signed    DT/MEDQ  D:  07/12/2008  T:  07/12/2008  Job:  [id number]   cc:   Holley Bouche, M.D.  Bruce Elvera Lennox Juanda Chance, MD, [facility]  Duke Salvia, MD, [facility]  Pricilla Riffle, MD, [facility]

## 2011-01-31 NOTE — H&P (Signed)
NAMECHRISTOPHR, [name]                 ACCOUNT NO.:  [id number]   MEDICAL RECORD NO.:  [id number]          PATIENT TYPE:  EMS   LOCATION:  MAJO                         FACILITY:  [facility]   PHYSICIAN:  Hollice Espy, M.D.DATE OF BIRTH:  1931-01-29   DATE OF ADMISSION:  03/03/2007  DATE OF DISCHARGE:                              HISTORY & PHYSICAL   ADDENDUM:  The patient's medications list has been found and brought to  my attention.  The patient is on 5 mg of Actonel and 150 mg of Cytoxan,  60 mg of prednisone, 500 mg of Valtrex, 100 mg of cyclosporin b.i.d.,  800/160 mg of Bactrim-DS every other day, and he also takes a daily  aspirin, multivitamin, 160 of Diovan, 10 mg of hydroxyzine, 0.5 solution  of Vigamox four times a day in the right eye but apparently this was  recently stopped, and he is also on 0.5 mg of erythromycin drops four  times a day in the right eye as needed.   With these findings we will go ahead and check a cyclosporin level on  this patient as well as certainly there is a concern for him being on a  high dose of steroids, putting him on a steroid taper.  We will also  need to find out what exactly medical history he has to be on these type  of medications again as there is no family present to give me anything  further.      Hollice Espy, M.D.  Electronically Signed     SKK/MEDQ  D:  03/04/2007  T:  03/04/2007  Job:  [id number]   cc:   Hollice Espy, M.D.

## 2011-01-31 NOTE — H&P (Signed)
[name], [name]                 ACCOUNT NO.:  [id number]   MEDICAL RECORD NO.:  [id number]          PATIENT TYPE:  EMS   LOCATION:  MAJO                         FACILITY:  [facility]   PHYSICIAN:  Hollice Espy, M.D.DATE OF BIRTH:  04-Jan-1931   DATE OF ADMISSION:  03/03/2007  DATE OF DISCHARGE:                              HISTORY & PHYSICAL   PRIMARY CARE PHYSICIAN:  Dr. Leonides Sake.   CHIEF COMPLAINT:  Altered mental status and seizure.   HISTORY OF PRESENT ILLNESS:  Patient is a 77 year old white male with  past medical history of certain eye conditions as well as hypertension.  Please note that this history is extremely limited in that the patient  himself is confused, not able to give me an adequate history and the  patient's family is not present.  Patient's family has spoken with the  ER attending who passes this information on third-hand.  Reportedly the  patient has been recently put on a high dose of prednisone as per his  ophthalmologist for a certain eye condition.  This has been started in  the past few days.  The patient has been reportedly acting confused  today and was brought in after having a seizure into the emergency room.  In the emergency room the patient reportedly had a second seizure, was  given a milligram of Ativan.  A CT scan of the head was done which was  unremarkable.  Lab work done showed a white count of 11.1 with a 92%  shift, other labs of concern were a sodium of 125, a chloride of 91 with  a bicarb level of 18 and a creatinine of 1.8, but with no elevation of  BUN and an albumin level of 3.2.  The patient himself has not been able  to give me a history.  He says he is feeling okay.  While he is alert  and oriented x2, he thinks the year is 70, although he does seem to  get the month and season correctly.  He is not sure why he is in the  hospital and he is able to give me a fairly adequate history when  talking about his eyes but his scope  does not seem to extend beyond  that.  He also seems to be extremely pleasant, although his mannerisms  seem to be out of place for his current situation of being in the  hospital.  When I told him that he had a seizure he seemed to dismiss  this.  Again, his family is not able to give me any kind of history.  The patient tells me he is doing okay.  He tells me his vision is not  great because of his chronic eye problems but he denies any dysphagia,  chest pain, palpitations, shortness of breath, wheezing, coughing,  abdominal pain, hematuria.  He says he has frequent urination problems.  No constipation or diarrhea.  No focal extremity numbness, weakness or  pain.   REVIEW OF SYSTEMS:  Otherwise negative.   PAST MEDICAL HISTORY:  From what best I can understand  is that he has  hypertension and a chronic eye condition.   MEDICATIONS:  Even these are in question.  He is on Actonel, Diovan and  prednisone, all doses unknown.  Valtrex liquid form, although I imagine  that this may be the eye drops and not the systemic medication.  Reportedly he is also on cyclophosphamide.   ALLERGIES:  No known drug allergies.   SOCIAL HISTORY:  He denies any tobacco, alcohol or drug use.   FAMILY HISTORY:  Noncontributory.   PHYSICAL EXAM:  VITAL SIGNS: On admission, temperature 98.4, heart rate  initially 111, now down to 84, blood pressure 145/67, respirations 20,  O2 saturation 98% on room air.  IN GENERAL: Patient appears to be alert and oriented x2, no apparent  distress.  HEENT: Normocephalic, atraumatic.  His mucous membranes are slightly  dry.  He has no carotid bruits.  HEART: Regular rate and rhythm, S1/S2.  LUNGS: Clear to auscultation bilaterally.  ABDOMEN: Soft, nontender, nondistended, positive bowel sounds.  EXTREMITIES: Show no clubbing, cyanosis or edema.  NEURO: He has no focal neurological deficits.   White count 11.1 with 92% shift, H and H 12.5 and 37, MCV of 99,   platelet count 209.  Sodium 125, potassium 4, chloride 91, bicarb 18,  BUN 22, creatinine 1.8, glucose 98.  LFTs are notable for an albumin of  3.2, UA is negative.   ASSESSMENT AND PLAN:  1. Seizures and altered mental status.  Certainly this may be alcohol      withdrawal.  The patient gives no history but I do note that his      MCV is close to the high end of normal at 99.  We need to better      discuss this with the wife.  On the other hand, I am concerned      especially noting Valtrex on his medication list about the      possibility of a herpes encephalitis, especially given his slightly      elevated white count.  On the other hand the elevated white count      may be stress margination.  The ER doctor has already left and I am      unable to get a lumbar puncture so we will plan to treat with IV      acyclovir.  We will get Interventional Radiology to do a lumbar      puncture and continue to follow.  Please also note in review of      literature that prednisone can alter CSF results to be      inconclusive.  In the meantime we will also load with Dilantin.  We      will also need to clarify the patient's medications to see what      type of cyclosporin he is and take a level if it is indeed      appropriate.  Some of his altered mental status may be from      prednisone but I do not think that this would necessarily lead to      seizures.  2. Eye condition of unknown specificity.  Once the office opens we      will discuss with his doctors at [facility], find out exactly what      medicines he is on.  3. Hyponatremia.  Unsure if this is chronic or acute.  We will discuss      with Dr. Tiburcio Pea.  4. Renal  insufficiency.  We will need to again touch base with Dr.      Tiburcio Pea in terms of whether this is acute or chronic.  We will also      gently hydrate to see if this improves.  5. Protein calorie malnutrition.  6. Mild metabolic acidosis, most likely from seizure.       Hollice Espy, M.D.  Electronically Signed     SKK/MEDQ  D:  03/04/2007  T:  03/04/2007  Job:  [id number]   cc:   Holley Bouche, M.D.

## 2011-01-31 NOTE — Discharge Summary (Signed)
NAMESHARIF, RENDELL                 ACCOUNT NO.:  [id number]   MEDICAL RECORD NO.:  [id number]          PATIENT TYPE:  INP   LOCATION:  6706                         FACILITY:  [facility]   PHYSICIAN:  Corinna L. Lendell Caprice, MDDATE OF BIRTH:  01/11/1931   DATE OF ADMISSION:  03/11/2007  DATE OF DISCHARGE:  03/18/2007                               DISCHARGE SUMMARY   ADDENDUM:  Please see my previous discharge summary on this patient for  the fax number.   This is an addendum to the previously dictated discharge summary.  The  patient's family also had requested ophthalmology consult while patient  was in the hospital to both check the patient's eye condition as well as  offer a second opinion.  Dr. Sheffield Slider was on-call and kindly saw Mr.  Koenen.  He was unable to perform a full ophthalmologic examination in  the hospital, but did examine the eye as best as he could without the  equipment and evaluated his medications and agreed with the plan.  He  had recommended follow up with Dr. Eustaquio Maize.  Also, the patient was  continued on his angiotensin receptor blocker, but his Lotrel had been  held, and I have not resumed the Lotrel, but I have resumed the Diovan  on discharge.      Corinna L. Lendell Caprice, MD  Electronically Signed     CLS/MEDQ  D:  03/18/2007  T:  03/18/2007  Job:  [id number]   cc:   Holley Bouche, M.D.  Dr. Eustaquio Maize

## 2011-01-31 NOTE — Procedures (Signed)
ATTENDING PHYSICIAN:  [name], MD.   CLINICAL HISTORY:  A 77 year old man admitted with 2 episodes of  syncope.  He has history of seizures secondary to hyponatremia.  EEG is  done for evaluation.  This is a portable EEG done at the bedside.  The  patient describes awake and drowsy.   DESCRIPTION:  The dominant rhythm is tracing as a moderate amplitude  alpha rhythm of 9 Hz, which predominates posteriorly, attenuates with  eye opening and closing, and appears without abnormal asymmetry.  . Low  amplitude fast activity is seen frontally and centrally and appears  without abnormal asymmetry.  No focal slowing is noted and no  epileptiform discharge was seen.  Drowsiness occurs naturally is noticed  by fragmentation at the background and some mild slowing in background  rhythms, particularly in central areas.  No abnormalities seen in  drowsiness.  Stage II sleep is not achieved.  Photic stimulation and  hyperventilation not performed.  Single channel devoted EKG revealed  sinus rhythm throughout with a rate of approximately 90 beats per  minute.   CONCLUSIONS:  Normal study in awake and drowsy state.      Michael L. Thad Ranger, M.D.  Electronically Signed     ZOX:WRUE  D:  07/10/2008 18:14:59  T:  07/11/2008 05:03:20  Job #:  [id number]

## 2011-01-31 NOTE — Consult Note (Signed)
[name], [name]                 ACCOUNT NO.:  [id number]   MEDICAL RECORD NO.:  [id number]          PATIENT TYPE:  INP   LOCATION:  2915                         FACILITY:  [facility]   PHYSICIAN:  Pricilla Riffle, MD, FACCDATE OF BIRTH:  [birth year]   DATE OF CONSULTATION:  07/10/2008  DATE OF DISCHARGE:                                 CONSULTATION   IDENTIFICATION:  [name] is a 77 year old gentleman who we were asked  to see regarding sinus arrest and syncope.   HISTORY OF PRESENT ILLNESS:  The patient was in his usual state of  health until yesterday, he went to [employer] to get a prescription  filled, he was waiting in line, became lightheaded, sat down and  reportedly passed out.  EMS was called.  When he recovered, he was  feeling okay.  They went off, he got back in line, and felt again dizzy,  ended up passing out.  He was brought to [facility] for further  evaluation.   Last evening, he was admitted to the Medicine Service and while on  telemetry, he had over 14-second pause.  He was transferred to the CCU  for further monitoring and we were called.   The patient denies any episodes of syncope in the past.  Denies  shortness of breath.  No chest pressure.   ALLERGIES:  Poor reaction to CYCLOSPORIN.   PAST MEDICAL HISTORY:  1. Hypertension.  2. History of ulcerative scleritis, being followed at [facility]      by Ophthalmology, tapering down on CellCept.  3. History of seizure disorder, question secondary to meds      (cyclosporin in setting of hyponatremia).  4. Question history of psychosis.   SOCIAL HISTORY:  The patient is married.  His wife has bipolar disorder.  Does not smoke.  Does not drink.   FAMILY HISTORY:  Noncontributory.   REVIEW OF SYSTEMS:  All systems reviewed and negative to the above  problem except as noted above.   MEDICATIONS ON ADMISSION:  1. CellCept 500 every other day.  2. Diovan 160.  3. Eye drops.  4. Here in the hospital,  he was also placed on Lovenox 40 daily.  5. Protonix 40 daily.   PHYSICAL EXAMINATION:  GENERAL:  On exam this morning, the patient had  just been given atropine for again, another syncopal spell with pause.  VITAL SIGNS:  Blood pressure is 180/98, pulse was 102, temperature is  98.5, and O2 sat on room air was 97%.  HEENT:  Slight gash under chin, otherwise nontraumatic.  EOMI, PERRL.  Mucous membranes are moist.  NECK:  Bilateral bruits.  No thyromegaly.  No JVD.  LUNGS:  Clear to auscultation without rales or wheezes.  CARDIAC:  Regular rate and rhythm, S1 and S2.  No S3.  No significant  murmurs.  ABDOMEN:  Supple and nontender.  Normal bowel sounds.  No hepatomegaly.  EXTREMITIES:  Good distal pulses throughout.  No lower extremity edema.  NEUROLOGIC:  Alert and oriented x3.  Cranial nerves II through XII  grossly intact.  A 12-lead EKG done at 0534 this morning showed normal sinus rhythm, 96  beats per minute.  Nonspecific ST-T wave changes.  EKG strips show sinus  arrest with one greater than 14-second pause, QRS is narrow with QRS of  duration of 78 msec.   LABORATORY DATA:  Significant for BUN and creatinine of 15 and 1.3,  potassium of 3.9.  Hemoglobin of 13.7, WBC of 11.4, and platelets of  208.  D-dimer elevated at 2.6.  CK-MB of 130/2.3.  Troponin less than  0.08.   Chest x-ray showed COPD with mild chronic interstitial changes.  His  chest CT preliminary, no evidence of PE, evidence of COPD with mild  pulmonary fibrosis.  There is mild thickening of the common carotids.  There is calcification of the left subclavian not the right coronary  artery in particular is noted be heavily calcified.   IMPRESSION:  [name] is a 77 year old gentleman with several episodes  of syncope.  All new since yesterday, says he gets along okay.  He  denies any anginal symptoms.  Review of a CT scan, he has heavy  calcification going along with coronary artery disease.   I have  discussed the case with [name], plan for cardiac  catheterization to rule out any critical lesions of the right coronary  artery, which may be contributing to this.  Then, permanent pacemaker  placement.  The risks and benefits explained to the patient.  He  understands and agrees to proceed.   Health care maintenance.  We will check fasting lipid panel in the a.m.  given above findings on coronaries.   The patient will remain on Primary Medicine Service.  We will continue  to follow.      Pricilla Riffle, MD, [facility]  Electronically Signed     PVR/MEDQ  D:  07/10/2008  T:  07/11/2008  Job:  [id number]

## 2011-01-31 NOTE — Procedures (Signed)
EEG NUMBER:  [id number]   REQUESTING PHYSICIAN:  Dr. Ellison Carwin.   ATTENDING PHYSICIAN:  Dr. Crista Curb.   CLINICAL HISTORY:  [age]-year-old male with possible seizure  versus syncope.  EEG is performed evaluation.  This is a routine EEG  done with photic stimulation, but not hyperventilation.  The patient is  described as awake and asleep.   DESCRIPTION:  The dominant waking rhythm in this tracing is a moderate-  amplitude alpha rhythm of 9-10 Hz which predominates posteriorly,  appears without abnormal asymmetry, and attenuates with eye opening and  closing.  Low-amplitude fast activity is seen frontally and centrally  and appears without abnormal asymmetry.  No focal slowing is noted.  No  epileptiform discharges are seen.  Drowsiness occurs naturally as  evidenced by fragmentation of the background and generalized attenuation  of rhythms.  Stage II sleep is achieved as evidenced by occasional  appearance of K complexes, vertex waves and sleep spindles.  No  abnormality is seen in the sleep state.  Photic stimulation produced  strong symmetric driver responses.  Hyperventilation was not performed.  Single channel devoted to EKG revealed sinus rhythm throughout with a  rate of approximately 78 beats per minute.   CONCLUSIONS:  Normal study in the awake, drowsy and sleep states.      Michael L. Thad Ranger, M.D.  Electronically Signed     EAV:WUJW  D:  03/05/2007 21:51:25  T:  03/06/2007 10:38:53  Job #:  [id number]

## 2011-01-31 NOTE — Cardiovascular Report (Signed)
NAMEFINNIS, COLEE                 ACCOUNT NO.:  [id number]   MEDICAL RECORD NO.:  [id number]           PATIENT TYPE:   LOCATION:                                 FACILITY:   PHYSICIAN:  Bruce R. Juanda Chance, MD, FACCDATE OF BIRTH:  [birth year]   DATE OF PROCEDURE:  07/10/2008  DATE OF DISCHARGE:                            CARDIAC CATHETERIZATION   CLINICAL HISTORY:  Mr. Markwood is 77 years old and was admitted with  syncope and had documented marked asystole with sinus pauses and atrial  arrest.  He underwent diagnostic angiography today by Dr. Riley Kill and  was found to have 3-vessel disease with a very tight lesion in the right  coronary and a moderate tight lesion in the LAD.  After discussion with  Dr. Graciela Husbands and Dr. Riley Kill and myself, we decided to proceed with  permanent pacemaker today with plans for coronary intervention on his  right at a later time.   INDICATIONS:  Marked bradycardia due to atrial asystole with syncope.   PROCEDURE:  Implantation of a Zephyr DDD pacemaker (model [id number], serial  number [id number]) using a St. Jude screw-in atrial lead (model #1688TC, 52  cm, serial number [id number]) and a bipolar screw-in ventricular lead  (model #1688TC/58 cm, serial number [id number]).   ANESTHESIA:  A 1% local Xylocaine.   ESTIMATED BLOOD LOSS:  Less than 15 mL.   COMPLICATIONS:  None.   PROCEDURE:  The procedure was performed in laboratory room #3.  The left  anterior chest was prepped and draped in the usual fashion.  The skin  and subcutaneous tissue were anesthetized with 1% local Xylocaine.  With  the help of a venogram and using an 18-gauge thin-wall needle,  subclavian vein was accessed and secured with a 0.03 wire.  An incision  was made below the clavicle and extended to the prepectoral fascia.  A  pocket was made inferior to incision using blunt dissection.  Using two  9-French sheaths, the atrial and ventricular leads were passed through  the right atrium.  A  figure-of-eight suture was placed at the entry site  for hemostasis for later screwing the leads.  A point of that wire was  left in the subclavian vein for later access.  The ventricular lead  initially was positioned in the right ventricular apex, but we were not  able to get good pacing parameters there and the lead appeared to go out  further than expected.  We then repositioned the lead into the septum  and got good pacing parameters, described below after screwing in the  lead.  We then positioned the atrial lead in the right atrial appendage  and screwed this in with good pacing parameters, described below.  After  removal of the stylets and 0.03 wire, the figure-of-eight suture was  secured at the entry site.  The leads were attached to the prepectoral  fascia with 2 sutures of 1-0 silk around each Silastic collar.  The  pocket was irrigated with sterile kanamycin solution.  The leads were  attached to the generator with an excellent wrench.  The  generator  implanted into the pocket and secured loosely to prepectoral fascia with  1-0 silk.  The subcutaneous tissue was closed with running 2-0 Vicryl.  Skin was closed with running 4-0 Vicryl.   PACING PARAMETERS:  Atrial lead, P wave 3.4 mV.  Minimum threshold  capture 1 volt with a pulse width of 0.5 milliseconds.  Impedance 471  ohms.   Ventricular lead:  R wave 11.5 mV.  Minimum threshold capture 1.1 volt  with a pulse width of 0.5 milliseconds.  Impedance 807 ohms.  There is  no pacing in the diaphragm in either leads at 10 volts.  The patient  tolerated the  procedure well and left the laboratory in satisfactory condition.  We  will plan to watch the patient until Sunday, today is Friday.  We will  have the patient return in a week for intervention on the right coronary  and probably evaluate the LAD with stress test after that.  We will  schedule the intervention for Dr. Riley Kill a week from today.      Bruce Elvera Lennox Juanda Chance,  MD, [facility]  Electronically Signed     BRB/MEDQ  D:  07/10/2008  T:  07/11/2008  Job:  [id number]   cc:   Arturo Morton. Riley Kill, MD, [facility]  Duke Salvia, MD, [facility] Team

## 2011-01-31 NOTE — Assessment & Plan Note (Signed)
[facility]                            CARDIOLOGY OFFICE NOTE   COLBERT, CURENTON                        MRN:          [id number]  DATE:11/09/2008                            DOB:          05-09-1931    PRIMARY CARE PHYSICIAN:  Holley Bouche, MD   CARDIOLOGIST:  Pricilla Riffle, MD, [facility]   CLINICAL HISTORY:  Mr. Fellner is a 77 year old and returned for  management of his pacemaker.  He was hospitalized in last fall with  bradycardia and ischemia and he underwent rotational atherectomy and a  drug-eluting stent to the right coronary by Dr. Riley Kill.  He also  underwent a permanent pacemaker for bradycardia by myself.  He has done  well.  Since that time he has had no recent chest pain, shortness of  breath, or palpitations.   PAST MEDICAL HISTORY:  Significant for hypertension and hyperlipidemia.   CURRENT MEDICATIONS:  1. Plavix.  2. Aspirin.  3. Fish oil.  4. Metoprolol 25 mg b.i.d.  5. Temazepam.  6. Calcium.  7. Zocor 40 mg.  8. Amlodipine 5 mg daily.   PHYSICAL EXAMINATION:  VITAL SIGNS:  Blood pressure is 161/78 and pulse  59 and regular.  Blood pressure at home was 136 systolic.  NECK:  There was no venous tension.  There is short left carotid bruit.  CHEST:  Clear without rales or rhonchi.  HEART:  Rhythm is regular.  No murmurs or gallops.  ABDOMEN:  Soft with normal bowel sounds.  EXTREMITIES:  Peripheral pulses are full with no peripheral edema.   IMPRESSION:  1. Coronary artery disease, status post St. Jude Cypher DDD pacemaker      implantation on July 10, 2008, for bradycardia.  2. Coronary artery disease, status post rotational atherectomy and      drug-eluting stent placed into the right coronary artery, October      2009.  3. Hypertension.  4. Hyperlipidemia.   RECOMMENDATIONS:  We will interrogate and adjust the output on Mr.  Bethea pacemaker today.  I will plan to see him back in followup in a  year for his  pacemaker and put him on tele trace checks after that.     Bruce Elvera Lennox Juanda Chance, MD, [facility]  Electronically Signed    BRB/MedQ  DD: 11/09/2008  DT: 11/10/2008  Job #: [id number]

## 2011-01-31 NOTE — Cardiovascular Report (Signed)
NAMEDELAN, KSIAZEK                 ACCOUNT NO.:  [id number]   MEDICAL RECORD NO.:  [id number]          PATIENT TYPE:  INP   LOCATION:  2915                         FACILITY:  [facility]   PHYSICIAN:  Arturo Morton. Riley Kill, MD, FACCDATE OF BIRTH:  [birth year]   DATE OF PROCEDURE:  07/10/2008  DATE OF DISCHARGE:                            CARDIAC CATHETERIZATION   INDICATIONS:  Mr. Gram is a delightful 77 year old gentleman who  presents with sinus arrest.  He was seen by Dr. Tenny Craw.  This case was  discussed with Dr. Graciela Husbands.  He was subsequently brought to the  Catheterization Laboratory for emergency temporary transvenous pacing  and also for coronary arteriography.  He arrived in the laboratory and I  discussed the risks, benefits, and alternatives with the patient.  I  answered questions.  He was agreeable to proceed.   PROCEDURES:  1. Insertion of temporary transvenous pacer.  2. Left heart catheterization.  3. Selective coronary arteriography.  4. Selective left ventriculography.   DESCRIPTION OF THE PROCEDURE:  The patient was brought to the  Catheterization Laboratory and prepped and draped in the usual fashion.  Through an anterior puncture, the right femoral vein was entered using a  Smart needle.  A 6-French sheath was then placed.  Temporary transvenous  pacer was then passed to the RV apex with balloon tip catheter and  pacing thresholds obtained.  Following this, the pacer was secured in  its site.  Following this, the femoral artery was entered.  A 5-French  sheath was placed.  There was calcified aorta, and we had to guide the  right coronary artery up the aorta in order to pass the wire.  We were  able to get into the central aorta without difficulty.  A Noto catheter  was then used to inject the right coronary artery.  Following this, left  coronary arteriography was performed.  This was followed by central  aortic and left ventricular pressures and ventriculography in the  RAO  projection.  We then had Dr. Graciela Husbands and Dr. Juanda Chance, and they both came to  the laboratory where we reviewed potential options for treatment.  The  patient has 2 high-grade lesions in the right coronary artery, but no  symptoms.  He also has disease involving the LAD and circumflex system,  but again no symptoms and well-preserved left ventricular function.  He  does have sinus arrest.  Whether the RCA accounts for this is unclear,  but it was felt that the first priority at this point would be permanent  pacing.  The decision was made to secure the sheaths in place and the  temporary wire and proceed with permanent pacing later this afternoon.  Following this, we will make a decision regarding revascularization.  Revascularization at the earliest would be approximately 1 week down the  road and the patient will be treated medically in the interim.  The plan  would be to take him to the holding area and bring him back for  permanent pacing this afternoon.  With this in mind, we elected to  secure the temporary wire  in place with 2 sutures.  A third suture was  then subsequently placed and position documented on fluoroscopy.  We  gave a small heparin bolus and started him on low-dose 500 units of  intravenous heparin to keep sheaths intact from clotting.  He was taken  to the holding area in satisfactory clinical condition.   HEMODYNAMIC DATA:  1. The central aortic pressure was 168/84, mean 122.  2. The left ventricular pressure was 166/11.  3. There was no significant gradient on pullback across the aortic      valve.   ANGIOGRAPHIC DATA:  1. On plain fluoroscopy, there was extensive calcification of the      coronary arterial system.  2. Ventriculography was done in the RAO projection and reveals      hyperdynamic LV function.  On a post-PVC beat, there was some mild      MR, but this was felt to be following skipped beat.  3. The right coronary artery is extensively calcified.   There is a      proximal mid 90-95% stenosis followed by another 90% stenosis in      the long area of diffuse calcified disease.  There is 40-60%      narrowing distally that extends in at least partially to a smaller      PDA.  Posterolateral branch is noncritical.  4. The left main is free of critical disease but does taper distally.  5. The LAD is extensively calcified.  There are tandem lesions of      about 70-80% after the second diagonal.  Prior to this, there is      diffuse segmental 40-50% disease crossing the origin of the first      smaller diagonal.  The second diagonal, which is fairly large has      an ostial 60-70% stenosis.  The distal LAD is a graftable vessel.  6. The circumflex demonstrates about 40% ostial narrowing and is      calcified.  There is diffuse segmental 80% narrowing in the OM1      with 30-40% narrowing of the AV circumflex after the takeoff of the      OM1.   CONCLUSIONS:  1. Significant 3-vessel coronary artery disease with significant      disease of the RCA.  2. Preserved left ventricular function.  3. Sinus arrest.   PLAN:  Dr. Juanda Chance, Dr. Graciela Husbands, and I have reviewed this extensively.  The  plan at this point in time would be to consider permanent pacing later  this afternoon, which will be performed by Dr. Juanda Chance.  In addition, we  will plan probable either percutaneous intervention or consideration of  revascularization surgery in another week or two.       Arturo Morton. Riley Kill, MD, [facility]  Electronically Signed     TDS/MEDQ  D:  07/10/2008  T:  07/11/2008  Job:  [id number]   cc:   Pricilla Riffle, MD, [facility]  Duke Salvia, MD, [facility]  Bruce R. Juanda Chance, MD, [facility]

## 2011-01-31 NOTE — Assessment & Plan Note (Signed)
[facility]                            CARDIOLOGY OFFICE NOTE   NAME:[name], [name]                        MRN:          [id number]  DATE:09/21/2008                            DOB:          May 04, 1931    IDENTIFICATION:  [name] is a 77 year old gentleman with a history of  CAD status post PTCA/stent (drug-eluting with rotational atherectomy) to  the RCA.  He is also status post permanent pacemaker placement for his  bradycardia.  I last saw him in clinic on July 27, 2008.   In the interval, he is doing very well.  Denies dizziness.  No shortness  of breath, no chest pain.   CURRENT MEDICATIONS:  1. Plavix 75.  2. Aspirin 325.  3. Diovan 160.  4. Fish oil.  5. Zocor 20.  6. Metoprolol 25 b.i.d.  7. Temazepam 15 nightly.  8. Vitamin.  9. ICaps.  10.Multivitamin.  11.Calcium.   PHYSICAL EXAMINATION:  GENERAL:  The patient is in no acute distress.  VITAL SIGNS:  Blood pressure is 166/79 (in cardiac rehab it has been  ranging from 108-140, the patient was agitated this morning talking to  insurance company), pulse is 66 and regular, weight not taken.  NECK:  JVP is normal.  LUNGS:  Clear without rales or wheezes.  CHEST:  Pacer site is clean, dry.  No swelling.  CARDIAC:  Regular rate and rhythm.  S1, S2.  No S3, no significant  murmurs.  ABDOMEN:  Supple, nontender.  Normal bowel sounds.  No hepatomegaly.  EXTREMITIES:  No edema.  Right calf with small area of induration dark,  appears to be superficial thrombosed pain.  I told him just show to Dr.  Tiburcio Pea.   IMPRESSION:  1. Coronary artery disease, clinically doing well.  We would keep on      Plavix at least a year and aspirin.  Continue on other medications.  2. Dyslipidemia.  We will need to follow up lipids.  He thinks he had      him checked to Dr. Tiburcio Pea' office.  3. Bradycardia with syncope status post pacemaker placement.      Continue.  We will follow up in clinic.  4.  Hypertension.  We will follow.  I would not increase today.  He      does report a white coat hypertension as well, but in rehab his      pressures have been good.  5. History of ulcerative scleritis, being followed at [facility].   I will set follow up for September, sooner if problems develop.     Pricilla Riffle, MD, [facility]  Electronically Signed    PVR/MedQ  DD: 09/21/2008  DT: 09/22/2008  Job #: [id number]   cc:   Holley Bouche, M.D.

## 2011-01-31 NOTE — Consult Note (Signed)
[name], [name]                 ACCOUNT NO.:  [id number]   MEDICAL RECORD NO.:  [id number]          PATIENT TYPE:  INP   LOCATION:  3729                         FACILITY:  [facility]   PHYSICIAN:  Deanna Artis. Hickling, M.D.DATE OF BIRTH:  [birth year]   DATE OF CONSULTATION:  03/04/2007  DATE OF DISCHARGE:                                 CONSULTATION   CHIEF COMPLAINT:  New onset of seizures.   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  This is an autoimmune  condition that has been treated with prednisone and cyclosporin as well  as Valtrex.  The patient apparently had two seizures at home witnessed  by his wife.  These were generalized tonic-clonic in nature and were new  in onset.  The patient had been acting confused today and was more so  after the seizures.   The patient was evaluated in the emergency room by Dr. Virginia Rochester  who noted that CT scan of the brain was unremarkable.  The patient had a  sodium of 125; chloride of 91; white count of 11,100 with 92% polys  which represents demargination.  The patient also had mild azotemia with  creatinine of 1.8, and mild acidosis with a bicarb of 18.  He also had  albumin that was low at 3.2.   The patient was confused in emergency room.  He was pleasant and had no  problems with his language.  He was in denial concerning the behaviors  because he did not witness them, and apparently was not conscious during  them.   Decision was made to admit him for further workup.   REVIEW OF SYSTEMS:  A 12-system review is remarkable for chronic back  problems that seem to be improving with a topical treatments.  There has  been no dysphagia, chest pain, palpitations, shortness of breath,  wheezing, coughing, abdominal pain, hematuria.  The patient has urinary  frequency.  No constipation or  diarrhea.  No localized weakness,  numbness or pain.  Twelve-system review is otherwise negative.   PAST MEDICAL HISTORY:  Positive for hypertension.   MEDICATIONS:  Include:  1. Diovan 100 mg daily.  2. He was on Actonel; this is being held.  3. He also was on aspirin and this is also being held for a pending      LP.  4. Erythromycin ophthalmic ointment two drops to the right eye four      times a day.  5. He was on cyclosporin 100 mg twice a day, which was discontinued.  6. Cytoxan 150 mg daily.  7. Prednisone 20 mg three times daily.  8. He was treated with Bactrim DS for 24 hours for possible urinary      tract infection.   DRUG ALLERGIES:  None known.   FAMILY HISTORY:  Noncontributory for this problem.   SOCIAL HISTORY:  The patient does not use tobacco, alcohol or drugs.   EXAMINATION TONIGHT:  GENERAL:  Pleasant gentleman, rambling, in no  acute distress.  VITAL SIGNS:  Temperature 97.2, blood pressure 118/70, resting pulse 76,  respirations 18, oxygen saturation 96% on room air.  HEENT:  No infection.  No bruits.  LUNGS:  Clear to auscultation.  HEART:  Showed a systolic murmur at the aortic region radiating into the  neck.  ABDOMEN:  Soft, nontender.  Bowel sounds normal.  No hepatosplenomegaly.  EXTREMITIES:  Normal.  NEUROLOGIC EXAMINATION:  The patient was awake, alert, rambling.  No  dysphasia or dyspraxia.  Cranial nerves:  Round, reactive pupils.  Visual fields full.  Extraocular movements full.  No afferent pupillary  defect.  Symmetric facial strength, midline tongue and uvula.  Air  conduction greater than bone conduction bilaterally.  Motor examination:  Normal strength, tone and mass.  Good fine motor movements.  No pronator  drift.  Sensation shows mild peripheral neuropathy.  Good stereognosis.  Cerebellar examination:  Good finger-to-nose, rapid repetitive  movements.  Gait normal.  Toes were bilaterally flexor.  Deep tendon  reflexes were  absent.   IMPRESSION:  1. New onset of seizures, 780.39.  this may be related to cyclosporin.  2. Nonfocal neurologic examination.  3. Normal MRI scan with mild white matter disease.  4. Peripheral ulcerative scleritis.  5. Hyponatremia.  6. Mild azotemia.  7. Possible aortic stenosis.   PLAN:  1. Continue Dilantin.  If there is normal EEG, I think it should be      discontinued.  2. I doubt that lumbar puncture is going to yield much.  The patient      does not have meningismus nor encephalopathy.   I appreciate the opportunity to participate in his care.  If you have  questions or I can be of assistance, do not hesitate to contact me.      Deanna Artis. Sharene Skeans, M.D.  Electronically Signed     [facility]  D:  03/04/2007  T:  03/05/2007  Job:  [id number]   cc:   Corinna L. Lendell Caprice, MD

## 2011-02-03 ENCOUNTER — Other Ambulatory Visit: Payer: Self-pay | Admitting: *Deleted

## 2011-02-03 MED ORDER — SIMVASTATIN 40 MG PO TABS: 40.0000 mg | ORAL_TABLET | Freq: Every day | ORAL | Status: DC

## 2011-02-03 NOTE — Procedures (Signed)
[facility]  Patient:    [name], [name]                        MRN: [id number] Proc. Date: 04/19/01 Adm. Date:  [id number] Attending:  Louie Bun                           Procedure Report  PROCEDURE:  Colonoscopy.  GASTROENTEROLOGIST:  Everardo All. Madilyn Fireman, M.D.  INDICATIONS FOR PROCEDURE:  Screening colonoscopy in a 77 year old patient.  DESCRIPTION OF PROCEDURE:   The patient was placed in the left lateral decubitus position and placed on the pulse monitor with continuous low-flow oxygen delivered by nasal cannula.  He was sedated with 60 mg IV Demerol and 7 mg IV Versed.  The Olympus video colonoscope was inserted into the rectum and advanced to the cecum, confirmed by transillumination of McBurneys point and visualization of the ileocecal valve and appendiceal orifice.  The prep was excellent.  The cecum and ascending colon appeared normal.  The transverse colon revealed a single 1 cm polyp fulgurated by hot biopsy.  The remainder of the transverse and descending colon appeared normal.  The sigmoid colon revealed a few scattered diverticulum and no other abnormalities.  The rectum appeared normal, and retroflexed view of the anus revealed no obvious internal hemorrhoids.  The colonoscope was then withdrawn and the patient returned to the recovery room in stable condition.  He tolerated the procedure well, and there were no immediate complications.  IMPRESSIONS: 1. Transverse colon polyp. 2. Sigmoid diverticulosis.  PLAN:  Await histology for determination of interval and method for future colon screening. DD:  04/19/01 TD:  04/20/01 Job: [id number]

## 2011-02-03 NOTE — Op Note (Signed)
[name], [name]                 ACCOUNT NO.:  [id number]   MEDICAL RECORD NO.:  [id number]          PATIENT TYPE:  AMB   LOCATION:  ENDO                         FACILITY:  [facility]   PHYSICIAN:  John C. Madilyn Fireman, M.D.    DATE OF BIRTH:  [birth year]   DATE OF PROCEDURE:  06/15/2004  DATE OF DISCHARGE:                                 OPERATIVE REPORT   PROCEDURE:  Colonoscopy.   INDICATIONS FOR PROCEDURE:  Adenomatous colon polyp on colonoscopy three  years ago.   DESCRIPTION OF PROCEDURE:  The patient was placed in the left lateral  decubitus position and placed on the pulse monitor with continuous low-flow  oxygen delivered by nasal cannula.  He was sedated with 50 mcg IV Fentanyl  and 6 mg IV Versed.  The Olympus videocolonoscope was inserted into the  rectum and advanced to the cecum, confirmed by transillumination of  McBurney's point and visualization of the ileocecal valve and appendiceal  orifice.  The prep was good.  The cecum and ascending colon appeared normal,  with no masses, polyps, diverticula, or other mucosal abnormalities.  Within  the transverse colon, there was an 8-mm sessile polyp that was fulgurated by  hot biopsy.  Within the descending and sigmoid colon, there were a few  scattered diverticula and no other abnormalities.  The rectum appeared  normal.  On retroflex view, the anus revealed no obvious internal  hemorrhoids.  The scope was then withdrawn and the patient returned to the  recovery room in stable condition.  He tolerated the procedure well, and  there were no immediate complications.   IMPRESSION:  1.  Transverse colon polyp.  2.  Sigmoid diverticulosis.   PLAN:  Will await histology and will probably repeat colonoscopy in five  years.      [facility]/MEDQ  D:  06/15/2004  T:  06/15/2004  Job:  [id number]   cc:   Holley Bouche, M.D.  [address]  [place], [place] [address]  Fax: [phone]

## 2011-04-13 ENCOUNTER — Encounter: Payer: Self-pay | Admitting: Internal Medicine

## 2011-04-13 DIAGNOSIS — I498 Other specified cardiac arrhythmias: Secondary | ICD-10-CM

## 2011-06-16 ENCOUNTER — Other Ambulatory Visit: Payer: Self-pay | Admitting: Internal Medicine

## 2011-06-19 LAB — DIFFERENTIAL
Basophils Absolute: 0
Basophils Relative: 0
Eosinophils Absolute: 0.1
Eosinophils Relative: 1
Lymphocytes Relative: 6 — ABNORMAL LOW
Lymphs Abs: 0.7
Monocytes Absolute: 0.8
Monocytes Relative: 7
Neutro Abs: 9.8 — ABNORMAL HIGH
Neutrophils Relative %: 86 — ABNORMAL HIGH

## 2011-06-19 LAB — CBC
HCT: 30.6 — ABNORMAL LOW
HCT: 35.5 — ABNORMAL LOW
HCT: 37.3 — ABNORMAL LOW
HCT: 38.4 — ABNORMAL LOW
HCT: 39
HCT: 40.6
Hemoglobin: 10.4 — ABNORMAL LOW
Hemoglobin: 12 — ABNORMAL LOW
Hemoglobin: 12.8 — ABNORMAL LOW
Hemoglobin: 12.9 — ABNORMAL LOW
Hemoglobin: 13.3
Hemoglobin: 13.7
MCHC: 33.7
MCHC: 33.7
MCHC: 33.7
MCHC: 34
MCHC: 34
MCHC: 34.5
MCV: 98.3
MCV: 98.7
MCV: 98.7
MCV: 98.7
MCV: 99
MCV: 99.2
Platelets: 154
Platelets: 160
Platelets: 174
Platelets: 186
Platelets: 208
Platelets: 211
RBC: 3.11 — ABNORMAL LOW
RBC: 3.58 — ABNORMAL LOW
RBC: 3.77 — ABNORMAL LOW
RBC: 3.87 — ABNORMAL LOW
RBC: 3.97 — ABNORMAL LOW
RBC: 4.12 — ABNORMAL LOW
RDW: 13.8
RDW: 13.8
RDW: 13.8
RDW: 13.9
RDW: 13.9
RDW: 14
WBC: 10.6 — ABNORMAL HIGH
WBC: 10.8 — ABNORMAL HIGH
WBC: 11.4 — ABNORMAL HIGH
WBC: 6.8
WBC: 8.6
WBC: 9.9

## 2011-06-19 LAB — URINALYSIS, ROUTINE W REFLEX MICROSCOPIC
Bilirubin Urine: NEGATIVE
Glucose, UA: NEGATIVE
Hgb urine dipstick: NEGATIVE
Ketones, ur: NEGATIVE
Nitrite: NEGATIVE
Protein, ur: NEGATIVE
Specific Gravity, Urine: 1.016
Urobilinogen, UA: 0.2
pH: 6.5

## 2011-06-19 LAB — CARDIAC PANEL(CRET KIN+CKTOT+MB+TROPI)
CK, MB: 1.9
CK, MB: 2.7
CK, MB: 4.1 — ABNORMAL HIGH
CK, MB: 4.2 — ABNORMAL HIGH
Relative Index: 1.4
Relative Index: 1.9
Relative Index: 2.1
Relative Index: 2.5
Total CK: 134
Total CK: 139
Total CK: 168
Total CK: 192
Troponin I: 0.01
Troponin I: 0.05
Troponin I: 0.32 — ABNORMAL HIGH
Troponin I: 0.39 — ABNORMAL HIGH

## 2011-06-19 LAB — BASIC METABOLIC PANEL
BUN: 11
BUN: 13
BUN: 16
BUN: 19
CO2: 26
CO2: 26
CO2: 29
CO2: 30
Calcium: 8.3 — ABNORMAL LOW
Calcium: 8.4
Calcium: 8.6
Calcium: 9.5
Chloride: 100
Chloride: 103
Chloride: 104
Chloride: 106
Creatinine, Ser: 1.13
Creatinine, Ser: 1.19
Creatinine, Ser: 1.21
Creatinine, Ser: 1.24
GFR calc Af Amer: >60
GFR calc Af Amer: >60
GFR calc Af Amer: >60
GFR calc Af Amer: >60
GFR calc non Af Amer: 57 — ABNORMAL LOW
GFR calc non Af Amer: 58 — ABNORMAL LOW
GFR calc non Af Amer: 60 — ABNORMAL LOW
GFR calc non Af Amer: >60
Glucose, Bld: 118 — ABNORMAL HIGH
Glucose, Bld: 130 — ABNORMAL HIGH
Glucose, Bld: 90
Glucose, Bld: 99
Potassium: 3.4 — ABNORMAL LOW
Potassium: 3.4 — ABNORMAL LOW
Potassium: 3.9
Potassium: 4.2
Sodium: 136
Sodium: 136
Sodium: 136
Sodium: 137

## 2011-06-19 LAB — COMPREHENSIVE METABOLIC PANEL
ALT: 14
ALT: 15
AST: 20
AST: 26
Albumin: 3.6
Albumin: 3.7
Alkaline Phosphatase: 77
Alkaline Phosphatase: 83
BUN: 11
BUN: 15
CO2: 26
CO2: 26
Calcium: 8.8
Calcium: 9.1
Chloride: 102
Chloride: 103
Creatinine, Ser: 1
Creatinine, Ser: 1.31
GFR calc Af Amer: >60
GFR calc Af Amer: >60
GFR calc non Af Amer: 53 — ABNORMAL LOW
GFR calc non Af Amer: >60
Glucose, Bld: 107 — ABNORMAL HIGH
Glucose, Bld: 207 — ABNORMAL HIGH
Potassium: 3.4 — ABNORMAL LOW
Potassium: 3.9
Sodium: 135
Sodium: 135
Total Bilirubin: 0.5
Total Bilirubin: 0.6
Total Protein: 5.7 — ABNORMAL LOW
Total Protein: 6.3

## 2011-06-19 LAB — LIPID PANEL
Cholesterol: 159
Cholesterol: 176
HDL: 43
HDL: 47
LDL Cholesterol: 109 — ABNORMAL HIGH
LDL Cholesterol: 124 — ABNORMAL HIGH
Total CHOL/HDL Ratio: 3.7
Total CHOL/HDL Ratio: 3.7
Triglycerides: 24
Triglycerides: 36
VLDL: 5
VLDL: 7

## 2011-06-19 LAB — PROTIME-INR
INR: 1
INR: 1.1
Prothrombin Time: 13.3
Prothrombin Time: 14.2

## 2011-06-19 LAB — CK TOTAL AND CKMB (NOT AT ARMC)
CK, MB: 2.3
Relative Index: 1.8
Total CK: 130

## 2011-06-19 LAB — HEMOGLOBIN A1C
Hgb A1c MFr Bld: 5.6
Mean Plasma Glucose: 114

## 2011-06-19 LAB — B-NATRIURETIC PEPTIDE (CONVERTED LAB): Pro B Natriuretic peptide (BNP): 221 — ABNORMAL HIGH

## 2011-06-19 LAB — D-DIMER, QUANTITATIVE: D-Dimer, Quant: 2.59 — ABNORMAL HIGH

## 2011-06-19 LAB — TROPONIN I: Troponin I: <0.01

## 2011-06-19 LAB — URINE MICROSCOPIC-ADD ON

## 2011-06-19 LAB — PHOSPHORUS: Phosphorus: 2.7

## 2011-06-19 LAB — TSH
TSH: 3.376
TSH: 3.802

## 2011-06-19 LAB — MISCELLANEOUS TEST

## 2011-06-19 LAB — APTT: aPTT: 32

## 2011-06-19 LAB — GLUCOSE, CAPILLARY: Glucose-Capillary: 106 — ABNORMAL HIGH

## 2011-07-05 LAB — DIFFERENTIAL
Basophils Absolute: 0
Basophils Absolute: 0
Basophils Relative: 0
Basophils Relative: 0
Eosinophils Absolute: 0
Eosinophils Absolute: 0
Eosinophils Relative: 0
Eosinophils Relative: 1
Lymphocytes Relative: 11 — ABNORMAL LOW
Lymphocytes Relative: 2 — ABNORMAL LOW
Lymphs Abs: 0.2 — ABNORMAL LOW
Lymphs Abs: 0.6 — ABNORMAL LOW
Monocytes Absolute: 0.7
Monocytes Absolute: 0.8 — ABNORMAL HIGH
Monocytes Relative: 16 — ABNORMAL HIGH
Monocytes Relative: 7
Neutro Abs: 10.2 — ABNORMAL HIGH
Neutro Abs: 3.8
Neutrophils Relative %: 72
Neutrophils Relative %: 92 — ABNORMAL HIGH

## 2011-07-05 LAB — BASIC METABOLIC PANEL
BUN: 11
BUN: 15
BUN: 16
BUN: 17
BUN: 17
BUN: 17
BUN: 20
CO2: 24
CO2: 25
CO2: 27
CO2: 28
CO2: 28
CO2: 29
CO2: 29
Calcium: 8 — ABNORMAL LOW
Calcium: 8 — ABNORMAL LOW
Calcium: 8.1 — ABNORMAL LOW
Calcium: 8.2 — ABNORMAL LOW
Calcium: 8.2 — ABNORMAL LOW
Calcium: 8.9
Calcium: 9.1
Chloride: 100
Chloride: 101
Chloride: 102
Chloride: 107
Chloride: 94 — ABNORMAL LOW
Chloride: 96
Chloride: 97
Creatinine, Ser: 1.06
Creatinine, Ser: 1.22
Creatinine, Ser: 1.24
Creatinine, Ser: 1.28
Creatinine, Ser: 1.35
Creatinine, Ser: 1.45
Creatinine, Ser: 1.58 — ABNORMAL HIGH
GFR calc Af Amer: 52 — ABNORMAL LOW
GFR calc Af Amer: 58 — ABNORMAL LOW
GFR calc Af Amer: >60
GFR calc Af Amer: >60
GFR calc Af Amer: >60
GFR calc Af Amer: >60
GFR calc Af Amer: >60
GFR calc non Af Amer: 43 — ABNORMAL LOW
GFR calc non Af Amer: 48 — ABNORMAL LOW
GFR calc non Af Amer: 52 — ABNORMAL LOW
GFR calc non Af Amer: 55 — ABNORMAL LOW
GFR calc non Af Amer: 57 — ABNORMAL LOW
GFR calc non Af Amer: 58 — ABNORMAL LOW
GFR calc non Af Amer: >60
Glucose, Bld: 108 — ABNORMAL HIGH
Glucose, Bld: 110 — ABNORMAL HIGH
Glucose, Bld: 115 — ABNORMAL HIGH
Glucose, Bld: 121 — ABNORMAL HIGH
Glucose, Bld: 83
Glucose, Bld: 89
Glucose, Bld: 98
Potassium: 3.8
Potassium: 3.8
Potassium: 3.9
Potassium: 4
Potassium: 4.3
Potassium: 4.4
Potassium: 4.5
Sodium: 129 — ABNORMAL LOW
Sodium: 130 — ABNORMAL LOW
Sodium: 131 — ABNORMAL LOW
Sodium: 133 — ABNORMAL LOW
Sodium: 134 — ABNORMAL LOW
Sodium: 136
Sodium: 138

## 2011-07-05 LAB — COMPREHENSIVE METABOLIC PANEL
ALT: 37
AST: 35
Albumin: 3.2 — ABNORMAL LOW
Alkaline Phosphatase: 65
BUN: 22
CO2: 18 — ABNORMAL LOW
Calcium: 9
Chloride: 91 — ABNORMAL LOW
Creatinine, Ser: 1.79 — ABNORMAL HIGH
GFR calc Af Amer: 45 — ABNORMAL LOW
GFR calc non Af Amer: 37 — ABNORMAL LOW
Glucose, Bld: 98
Potassium: 4
Sodium: 125 — ABNORMAL LOW
Total Bilirubin: 0.8
Total Protein: 5.5 — ABNORMAL LOW

## 2011-07-05 LAB — POCT I-STAT CREATININE
Creatinine, Ser: 1.2
Operator id: 288831

## 2011-07-05 LAB — CBC
HCT: 32.6 — ABNORMAL LOW
HCT: 32.8 — ABNORMAL LOW
HCT: 37 — ABNORMAL LOW
HCT: 37.5 — ABNORMAL LOW
HCT: 40.3
Hemoglobin: 11 — ABNORMAL LOW
Hemoglobin: 11.2 — ABNORMAL LOW
Hemoglobin: 12.5 — ABNORMAL LOW
Hemoglobin: 12.7 — ABNORMAL LOW
Hemoglobin: 13.7
MCHC: 33.8
MCHC: 33.8
MCHC: 34
MCHC: 34
MCHC: 34.2
MCV: 98
MCV: 98.2
MCV: 98.3
MCV: 98.3
MCV: 98.5
Platelets: 172
Platelets: 186
Platelets: 192
Platelets: 193
Platelets: 209
RBC: 3.31 — ABNORMAL LOW
RBC: 3.34 — ABNORMAL LOW
RBC: 3.75 — ABNORMAL LOW
RBC: 3.81 — ABNORMAL LOW
RBC: 4.11 — ABNORMAL LOW
RDW: 14
RDW: 14.5 — ABNORMAL HIGH
RDW: 14.6 — ABNORMAL HIGH
RDW: 14.8 — ABNORMAL HIGH
RDW: 15.4 — ABNORMAL HIGH
WBC: 11.1 — ABNORMAL HIGH
WBC: 3.9 — ABNORMAL LOW
WBC: 5.1
WBC: 5.3
WBC: 8.6

## 2011-07-05 LAB — TSH
TSH: 0.383
TSH: 1.805

## 2011-07-05 LAB — URINALYSIS, ROUTINE W REFLEX MICROSCOPIC
Bilirubin Urine: NEGATIVE
Bilirubin Urine: NEGATIVE
Glucose, UA: NEGATIVE
Glucose, UA: NEGATIVE
Hgb urine dipstick: NEGATIVE
Ketones, ur: NEGATIVE
Ketones, ur: NEGATIVE
Leukocytes, UA: NEGATIVE
Nitrite: NEGATIVE
Nitrite: NEGATIVE
Protein, ur: NEGATIVE
Protein, ur: NEGATIVE
Specific Gravity, Urine: 1.013
Specific Gravity, Urine: 1.016
Urobilinogen, UA: 0.2
Urobilinogen, UA: 0.2
pH: 6
pH: 7

## 2011-07-05 LAB — CYCLOSPORINE LEVEL
Cyclosporine: 314
Cyclosporine: <40 — ABNORMAL LOW

## 2011-07-05 LAB — I-STAT 8, (EC8 V) (CONVERTED LAB)
Acid-Base Excess: 1
BUN: 18
Bicarbonate: 27.4 — ABNORMAL HIGH
Chloride: 102
Glucose, Bld: 106 — ABNORMAL HIGH
HCT: 43
Hemoglobin: 14.6
Operator id: 288831
Potassium: 3.6
Sodium: 134 — ABNORMAL LOW
TCO2: 29
pCO2, Ven: 47
pH, Ven: 7.374 — ABNORMAL HIGH

## 2011-07-05 LAB — HEPATIC FUNCTION PANEL
ALT: 39
AST: 27
Albumin: 3.3 — ABNORMAL LOW
Alkaline Phosphatase: 74
Bilirubin, Direct: 0.2
Indirect Bilirubin: 0.4
Total Bilirubin: 0.6
Total Protein: 6.5

## 2011-07-05 LAB — URINE MICROSCOPIC-ADD ON

## 2011-07-05 LAB — POCT CARDIAC MARKERS
CKMB, poc: 1.4
Myoglobin, poc: 101
Operator id: 288831
Troponin i, poc: <0.05

## 2011-07-05 LAB — AMMONIA: Ammonia: 14

## 2011-07-05 LAB — CALCIUM: Calcium: 8.5

## 2011-07-05 LAB — FOLATE: Folate: >20

## 2011-07-05 LAB — VITAMIN B12: Vitamin B-12: 863 (ref 211–911)

## 2011-07-05 LAB — RPR: RPR Ser Ql: NONREACTIVE

## 2011-07-18 ENCOUNTER — Encounter: Payer: Self-pay | Admitting: Internal Medicine

## 2011-07-19 ENCOUNTER — Encounter: Payer: Self-pay | Admitting: Internal Medicine

## 2011-07-19 ENCOUNTER — Ambulatory Visit (INDEPENDENT_AMBULATORY_CARE_PROVIDER_SITE_OTHER): Payer: Medicare Other | Admitting: Internal Medicine

## 2011-07-19 DIAGNOSIS — I495 Sick sinus syndrome: Secondary | ICD-10-CM

## 2011-07-19 DIAGNOSIS — I10 Essential (primary) hypertension: Secondary | ICD-10-CM

## 2011-07-19 DIAGNOSIS — R609 Edema, unspecified: Secondary | ICD-10-CM

## 2011-07-19 DIAGNOSIS — I2510 Atherosclerotic heart disease of native coronary artery without angina pectoris: Secondary | ICD-10-CM

## 2011-07-19 DIAGNOSIS — I498 Other specified cardiac arrhythmias: Secondary | ICD-10-CM

## 2011-07-19 LAB — BASIC METABOLIC PANEL
BUN: 30 mg/dL — ABNORMAL HIGH (ref 6–23)
CO2: 28 mEq/L (ref 19–32)
Calcium: 9.5 mg/dL (ref 8.4–10.5)
Chloride: 99 mEq/L (ref 96–112)
Creatinine, Ser: 1.3 mg/dL (ref 0.4–1.5)
GFR: 58.7 mL/min — ABNORMAL LOW (ref >60.00)
Glucose, Bld: 53 mg/dL — ABNORMAL LOW (ref 70–99)
Potassium: 3.4 mEq/L — ABNORMAL LOW (ref 3.5–5.1)
Sodium: 136 mEq/L (ref 135–145)

## 2011-07-19 LAB — PACEMAKER DEVICE OBSERVATION
AL AMPLITUDE: 1.2 mv
AL IMPEDENCE PM: 429 Ohm
AL THRESHOLD: 0.75 V
ATRIAL PACING PM: 45
BAMS-0001: 150 {beats}/min
BAMS-0003: 70 {beats}/min
BATTERY VOLTAGE: 2.79 V
DEVICE MODEL PM: 2231585
RV LEAD AMPLITUDE: 12 mv
RV LEAD IMPEDENCE PM: 391 Ohm
RV LEAD THRESHOLD: 1.125 V
VENTRICULAR PACING PM: 1

## 2011-07-19 MED ORDER — FUROSEMIDE 20 MG PO TABS: 20.0000 mg | ORAL_TABLET | Freq: Every day | ORAL | Status: DC

## 2011-07-19 NOTE — Assessment & Plan Note (Signed)
Stable on ASA and plavix  Decrease ASA to 81mg  daily due to nose bleeds.

## 2011-07-19 NOTE — Assessment & Plan Note (Signed)
Likely due to steroids  Check bmet today 2 gram sodium restriction Add lasix 20mg  daily  Follow-up with Dr Tenny Craw,  Further workup if not improved with weaning of steroids

## 2011-07-19 NOTE — Patient Instructions (Addendum)
Your physician recommends that you schedule a follow-up appointment in: 4 weeks with Dr Tenny Craw per Dr Johney Frame Your physician wants you to follow-up in: 12 months with Dr Jacquiline Doe will receive a reminder letter in the mail two months in advance. If you don't receive a letter, please call our office to schedule the follow-up appointment.  [employer] every 3 months  Your physician has recommended you make the following change in your medication:  1) decrease Aspirin to 81mg  daily 2)Furosemide 20mg  daily Your physician recommends that you return for lab work today:BMP    2 Gram Low Sodium Diet A 2 gram sodium diet restricts the amount of sodium in the diet to no more than 2 g or 2000 mg daily. Limiting the amount of sodium is often used to help lower blood pressure. It is important if you have heart, liver, or kidney problems. Many foods contain sodium for flavor and sometimes as a preservative. When the amount of sodium in a diet needs to be low, it is important to know what to look for when choosing foods and drinks. The following includes some information and guidelines to help make it easier for you to adapt to a low sodium diet. QUICK TIPS  Do not add salt to food.   Avoid convenience items and fast food.   Choose unsalted snack foods.   Buy lower sodium products, often labeled as "lower sodium" or "no salt added."   Check food labels to learn how much sodium is in 1 serving.   When eating at a restaurant, ask that your food be prepared with less salt or none, if possible.  READING FOOD LABELS FOR SODIUM INFORMATION The nutrition facts label is a good place to find how much sodium is in foods. Look for products with no more than 500 to 600 mg of sodium per meal and no more than 150 mg per serving. Remember that 2 g = 2000 mg. The food label may also list foods as:  Sodium-free: Less than 5 mg in a serving.   Very low sodium: 35 mg or less in a serving.   Low-sodium: 140 mg or less  in a serving.   Light in sodium: 50% less sodium in a serving. For example, if a food that usually has 300 mg of sodium is changed to become light in sodium, it will have 150 mg of sodium.   Reduced sodium: 25% less sodium in a serving. For example, if a food that usually has 400 mg of sodium is changed to reduced sodium, it will have 300 mg of sodium.  CHOOSING FOODS Grains  Avoid: Salted crackers and snack items. Some cereals, including instant hot cereals. Bread stuffing and biscuit mixes. Seasoned rice or pasta mixes.   Choose: Unsalted snack items. Low-sodium cereals, oats, puffed wheat and rice, shredded wheat. English muffins and bread. Pasta.  Meats  Avoid: Salted, canned, smoked, spiced, pickled meats, including fish and poultry. Bacon, ham, sausage, cold cuts, hot dogs, anchovies.   Choose: Low-sodium canned tuna and salmon. Fresh or frozen meat, poultry, and fish.  Dairy  Avoid: Processed cheese and spreads. Cottage cheese. Buttermilk and condensed milk. Regular cheese.   Choose: Milk. Low-sodium cottage cheese. Yogurt. Sour cream. Low-sodium cheese.  Fruits and Vegetables  Avoid: Regular canned vegetables. Regular canned tomato sauce and paste. Frozen vegetables in sauces. Olives. Rosita Fire. Relishes. Sauerkraut.   Choose: Low-sodium canned vegetables. Low-sodium tomato sauce and paste. Frozen or fresh vegetables. Fresh and frozen  fruit.  Condiments  Avoid: Canned and packaged gravies. Worcestershire sauce. Tartar sauce. Barbecue sauce. Soy sauce. Steak sauce. Ketchup. Onion, garlic, and table salt. Meat flavorings and tenderizers.   Choose: Fresh and dried herbs and spices. Low-sodium varieties of mustard and ketchup. Lemon juice. Tabasco sauce. Horseradish.  SAMPLE 2 GRAM SODIUM MEAL PLAN Breakfast / Sodium (mg)  1 cup low-fat milk / 143 mg   2 slices whole-wheat toast / 270 mg   1 tbs heart-healthy margarine / 153 mg   1 hard-boiled egg / 139 mg   1 small orange  / 0 mg  Lunch / Sodium (mg)  1 cup raw carrots / 76 mg    cup hummus / 298 mg   1 cup low-fat milk / 143 mg    cup red grapes / 2 mg   1 whole-wheat pita bread / 356 mg  Dinner / Sodium (mg)  1 cup whole-wheat pasta / 2 mg   1 cup low-sodium tomato sauce / 73 mg   3 oz lean ground beef / 57 mg   1 small side salad (1 cup raw spinach leaves,  cup cucumber,  cup yellow bell pepper) with 1 tsp olive oil and 1 tsp red wine vinegar / 25 mg  Snack / Sodium (mg)  1 container low-fat vanilla yogurt / 107 mg   3 graham cracker squares / 127 mg  Nutrient Analysis  Calories: 2033   Protein: 77 g   Carbohydrate: 282 g   Fat: 72 g   Sodium: 1971 mg  Document Released: 09/04/2005 Document Revised: 05/17/2011 Document Reviewed: 12/06/2009 [facility] Patient Information 2012 [place], [place].

## 2011-07-19 NOTE — Progress Notes (Signed)
The patient presents today for routine electrophysiology followup.  Since last being seen in our clinic, the patient reports doing reasonably well.   He has recently been placed on prednisone for scleritis.  He also reports increasing BLE since that time.  He admits to increased PO salt intake.  Today, he denies symptoms of palpitations, chest pain, shortness of breath, orthopnea, PND, dizziness, presyncope, syncope, or neurologic sequela. + recent nose bleeds. The patient feels that he is tolerating medications without difficulties and is otherwise without complaint today.   Past Medical History  Diagnosis Date  . Symptomatic bradycardia     s/p pacemaker  . Coronary artery disease     PCI RCA 2009  . Hypertension   . Dyslipidemia   . Seizure     felt due to cyclosporin in setting of electrolyte disorder  . Scleritis, unspecified     treated with prednisone   Past Surgical History  Procedure Date  . Pacemaker insertion     SJM    Current Outpatient Prescriptions  Medication Sig Dispense Refill  . amLODipine (NORVASC) 2.5 MG tablet Take 5 mg by mouth daily.       Marland Kitchen aspirin 325 MG tablet Take 325 mg by mouth daily.        . beta carotene w/minerals (OCUVITE) tablet Take 1 tablet by mouth 2 (two) times daily.        . Calcium-Vitamin D (CALTRATE 600 PLUS-VIT D PO) Take by mouth 2 (two) times daily.        . Cholecalciferol (VITAMIN D3) 2000 UNITS TABS Take 2,000 Units by mouth daily.        . finasteride (PROSCAR) 5 MG tablet Take 5 mg by mouth daily.        . metoprolol tartrate (LOPRESSOR) 25 MG tablet TAKE ONE TABLET BY MOUTH TWICE DAILY  180 tablet  2  . metroNIDAZOLE (METROCREAM) 0.75 % cream Apply 1 application topically 2 (two) times daily as needed.        . Multiple Vitamins-Minerals (CENTRUM SILVER PO) Take by mouth daily.        . Multiple Vitamins-Minerals (ICAPS) CAPS Take by mouth 2 (two) times daily.        . nitroGLYCERIN (NITROSTAT) 0.4 MG SL tablet Place 0.4 mg under  the tongue every 5 (five) minutes as needed.        . Omega-3 Fatty Acids (FISH OIL) 1000 MG CAPS Take 1,000 mg by mouth daily.        Marland Kitchen OVER THE COUNTER MEDICATION as needed. Stool softner       . PLAVIX 75 MG tablet TAKE ONE TABLET BY MOUTH EVERY DAY  90 each  0  . predniSONE (DELTASONE) 20 MG tablet 2 times daily      . simvastatin (ZOCOR) 40 MG tablet TAKE ONE TABLET BY MOUTH AT BEDTIME  30 tablet  6  . TEMAZEPAM PO Take 30 mg by mouth Nightly.          Allergies  Allergen Reactions  . Cyclosporine     History   Social History  . Marital Status: Married    Spouse Name: N/A    Number of Children: N/A  . Years of Education: N/A   Occupational History  . Not on file.   Social History Main Topics  . Smoking status: Never Smoker   . Smokeless tobacco: Not on file  . Alcohol Use: No  . Drug Use: No  . Sexually Active: Not on  file   Other Topics Concern  . Not on file   Social History Narrative   MarriedTobaccoetoh    ROS-  All systems are reviewed and are negative except as outlined in the HPI above   Physical Exam: Filed Vitals:   07/19/11 1111  BP: 133/72  Pulse: 63  Height: 5\' 10"  (1.778 m)  Weight: 166 lb (75.297 kg)    GEN- The patient is well appearing, alert and oriented x 3 today.   Head- normocephalic, atraumatic Eyes-  Sclera clear, conjunctiva pink Ears- hearing intact Oropharynx- clear Neck- supple, no JVP Lymph- no cervical lymphadenopathy Lungs- Clear to ausculation bilaterally, normal work of breathing Chest- pacemaker pocket is well healed Heart- Regular rate and rhythm, no murmurs, rubs or gallops, PMI not laterally displaced GI- soft, NT, ND, + BS Extremities- no clubbing, cyanosis, 2+ BLE edema   Pacemaker interrogation- reviewed in detail today,  See PACEART report  Assessment and Plan:

## 2011-07-19 NOTE — Assessment & Plan Note (Signed)
Stable No change required today  

## 2011-07-19 NOTE — Assessment & Plan Note (Signed)
Normal pacemaker function See Arita Miss Art report No changes today   TTMs every 3 months,  Return in 12 months

## 2011-07-31 ENCOUNTER — Other Ambulatory Visit: Payer: Self-pay | Admitting: Dermatology

## 2011-08-07 ENCOUNTER — Other Ambulatory Visit: Payer: Self-pay | Admitting: *Deleted

## 2011-08-07 DIAGNOSIS — E876 Hypokalemia: Secondary | ICD-10-CM

## 2011-08-07 MED ORDER — POTASSIUM CHLORIDE ER 10 MEQ PO TBCR: 20.0000 meq | EXTENDED_RELEASE_TABLET | Freq: Every day | ORAL | Status: DC

## 2011-08-12 ENCOUNTER — Other Ambulatory Visit: Payer: Self-pay | Admitting: Internal Medicine

## 2011-08-14 ENCOUNTER — Telehealth: Payer: Self-pay | Admitting: Internal Medicine

## 2011-08-14 ENCOUNTER — Ambulatory Visit (INDEPENDENT_AMBULATORY_CARE_PROVIDER_SITE_OTHER): Payer: Medicare Other | Admitting: Physician Assistant

## 2011-08-14 ENCOUNTER — Encounter: Payer: Self-pay | Admitting: Physician Assistant

## 2011-08-14 VITALS — BP 128/72 | HR 67 | Ht 71.0 in | Wt 169.8 lb

## 2011-08-14 DIAGNOSIS — I2510 Atherosclerotic heart disease of native coronary artery without angina pectoris: Secondary | ICD-10-CM

## 2011-08-14 DIAGNOSIS — E785 Hyperlipidemia, unspecified: Secondary | ICD-10-CM

## 2011-08-14 DIAGNOSIS — R609 Edema, unspecified: Secondary | ICD-10-CM

## 2011-08-14 DIAGNOSIS — K148 Other diseases of tongue: Secondary | ICD-10-CM

## 2011-08-14 LAB — CBC WITH DIFFERENTIAL/PLATELET
Basophils Absolute: 0 10*3/uL (ref 0.0–0.1)
Basophils Relative: 0.2 % (ref 0.0–3.0)
Eosinophils Absolute: 0.1 10*3/uL (ref 0.0–0.7)
Eosinophils Relative: 1.5 % (ref 0.0–5.0)
HCT: 38 % — ABNORMAL LOW (ref 39.0–52.0)
Hemoglobin: 13 g/dL (ref 13.0–17.0)
Lymphocytes Relative: 7.2 % — ABNORMAL LOW (ref 12.0–46.0)
Lymphs Abs: 0.7 10*3/uL (ref 0.7–4.0)
MCHC: 34.3 g/dL (ref 30.0–36.0)
MCV: 99.5 fl (ref 78.0–100.0)
Monocytes Absolute: 1.2 10*3/uL — ABNORMAL HIGH (ref 0.1–1.0)
Monocytes Relative: 12.8 % — ABNORMAL HIGH (ref 3.0–12.0)
Neutro Abs: 7.6 10*3/uL (ref 1.4–7.7)
Neutrophils Relative %: 78.3 % — ABNORMAL HIGH (ref 43.0–77.0)
Platelets: 184 10*3/uL (ref 150.0–400.0)
RBC: 3.82 Mil/uL — ABNORMAL LOW (ref 4.22–5.81)
RDW: 15.6 % — ABNORMAL HIGH (ref 11.5–14.6)
WBC: 9.7 10*3/uL (ref 4.5–10.5)

## 2011-08-14 LAB — URINALYSIS
Bilirubin Urine: NEGATIVE
Ketones, ur: NEGATIVE
Leukocytes, UA: NEGATIVE
Nitrite: NEGATIVE
Specific Gravity, Urine: 1.015 (ref 1.000–1.030)
Total Protein, Urine: NEGATIVE
Urine Glucose: NEGATIVE
Urobilinogen, UA: 0.2 (ref 0.0–1.0)
pH: 6 (ref 5.0–8.0)

## 2011-08-14 LAB — HEPATIC FUNCTION PANEL
ALT: 34 U/L (ref 0–53)
AST: 26 U/L (ref 0–37)
Albumin: 3.3 g/dL — ABNORMAL LOW (ref 3.5–5.2)
Alkaline Phosphatase: 70 U/L (ref 39–117)
Bilirubin, Direct: 0.2 mg/dL (ref 0.0–0.3)
Total Bilirubin: 1 mg/dL (ref 0.3–1.2)
Total Protein: 6 g/dL (ref 6.0–8.3)

## 2011-08-14 LAB — BASIC METABOLIC PANEL
BUN: 22 mg/dL (ref 6–23)
CO2: 29 mEq/L (ref 19–32)
Calcium: 8.9 mg/dL (ref 8.4–10.5)
Chloride: 100 mEq/L (ref 96–112)
Creatinine, Ser: 1.2 mg/dL (ref 0.4–1.5)
GFR: 62.7 mL/min (ref >60.00)
Glucose, Bld: 77 mg/dL (ref 70–99)
Potassium: 3.7 mEq/L (ref 3.5–5.1)
Sodium: 140 mEq/L (ref 135–145)

## 2011-08-14 LAB — TSH: TSH: 1.29 u[IU]/mL (ref 0.35–5.50)

## 2011-08-14 LAB — BRAIN NATRIURETIC PEPTIDE: Pro B Natriuretic peptide (BNP): 69 pg/mL (ref 0.0–100.0)

## 2011-08-14 LAB — D-DIMER, QUANTITATIVE: D-Dimer, Quant: 1.2 ug/mL-FEU — ABNORMAL HIGH (ref 0.00–0.48)

## 2011-08-14 NOTE — Patient Instructions (Addendum)
Your physician recommends that you schedule a follow-up appointment in: 6 weeks with Dr. Tenny Craw, if her schedule does not allow then schedule with Tereso Newcomer, PA-C same day Dr. Tenny Craw is in the office  Your physician recommends that you return for lab work in: TODAY BMET, LFT, TSH, CBC W/DIFF, BNP, D-DIMER, U/A 782.3  YOU HAVE BEEN GIVE A PRESCRIPTION FOR TED HOSE STOCKING THIGH HIGH  LOWER EXTREMITY VENOUS DUPLEX 782.3 EDEMA AND TO R/O DVT

## 2011-08-14 NOTE — Assessment & Plan Note (Signed)
Managed by PCP.  I asked him to discuss reducing dose of simvastatin to 20 mg given concomitant use of amlodipine.

## 2011-08-14 NOTE — Progress Notes (Signed)
History of Present Illness: PCP:  Dr. Tiburcio Pea Primary Cardiologist:  Dr. Dietrich Pates  Primary Electrophysiologist:  Dr. Hillis Range   [name] is a 77 y.o. male who returns for follow up.  He has a h/o CAD, s/p rotation atherectomy and DES to the RCA in 10/09 (LHC at that time: prox to mid RCA 90-95% then 90% - tx with PCI; dRCA 40-60%, LAD 70-80% after D2, 40-50% at D1, oD2 60-70%, oCFX 40%, OM1 80%, AVCFX 30-40%).  Last echo 10/09: EF 60-65%.  Other hx includes bradycardia, s/p pacer, HTN, hyperlipidemia, seizure hx, scleritis.  He was seen by Dr. Hillis Range on 07/20/11 for routine EP follow up.  He was recently put on prednisone for scleritis and noted BLE edema since.  Also noted epistaxis and ASA was decreased to 81 mg QD.  Lasix was started at 20 mg QD.  Labs 07/20/11: K 3.4, creatinine 1.3, BUN 30.  K+ was added to his medications.  He was brought back for follow up on edema.  It was thought he would need further workup if this did not improve with weaning of steroids.    He is still on prednisone 20 mg QD.  His LE edema is unchanged.  He has not noted a difference with the Lasix.  He denies chest pain, dyspnea, syncope, near syncope, orthopnea, PND.  He states that he always has this problem with taking prednisone.  He feels that his ophthalmologist will get him off the prednisone soon.  He is quite active.  He mows his grass and cleans the house.  He denies chest pain with this or dyspnea.    Past Medical History  Diagnosis Date  . Symptomatic bradycardia     s/p pacemaker  . Coronary artery disease     PCI RCA 2009  . Hypertension   . Dyslipidemia   . Seizure     felt due to cyclosporin in setting of electrolyte disorder  . Scleritis, unspecified     treated with prednisone  . Solitary kidney     Current Outpatient Prescriptions  Medication Sig Dispense Refill  . amLODipine (NORVASC) 2.5 MG tablet Take 5 mg by mouth daily.       Marland Kitchen aspirin 81 MG tablet Take 4 tablets (325 mg  total) by mouth daily.      . beta carotene w/minerals (OCUVITE) tablet Take 1 tablet by mouth 2 (two) times daily.        . Calcium-Vitamin D (CALTRATE 600 PLUS-VIT D PO) Take by mouth 2 (two) times daily.        . Cholecalciferol (VITAMIN D3) 2000 UNITS TABS Take 2,000 Units by mouth daily.        . finasteride (PROSCAR) 5 MG tablet Take 5 mg by mouth daily.        . furosemide (LASIX) 20 MG tablet Take 1 tablet (20 mg total) by mouth daily.  30 tablet  11  . metoprolol tartrate (LOPRESSOR) 25 MG tablet TAKE ONE TABLET BY MOUTH TWICE DAILY  180 tablet  2  . metroNIDAZOLE (METROCREAM) 0.75 % cream Apply 1 application topically 2 (two) times daily as needed.        . Multiple Vitamins-Minerals (CENTRUM SILVER PO) Take by mouth daily.        . Multiple Vitamins-Minerals (ICAPS) CAPS Take by mouth 2 (two) times daily.        . nitroGLYCERIN (NITROSTAT) 0.4 MG SL tablet Place 0.4 mg under the tongue every 5 (  five) minutes as needed.        . Omega-3 Fatty Acids (FISH OIL) 1000 MG CAPS Take 1,000 mg by mouth daily.        Marland Kitchen OVER THE COUNTER MEDICATION as needed. Stool softner       . PLAVIX 75 MG tablet TAKE ONE TABLET BY MOUTH EVERY DAY  90 each  0  . potassium chloride (K-DUR) 10 MEQ tablet Take 2 tablets (20 mEq total) by mouth daily.  30 tablet  11  . predniSONE (DELTASONE) 20 MG tablet 2 times daily      . simvastatin (ZOCOR) 40 MG tablet TAKE ONE TABLET BY MOUTH AT BEDTIME  30 tablet  6  . TEMAZEPAM PO Take 30 mg by mouth Nightly.          Allergies: Allergies  Allergen Reactions  . Cyclosporine     History  Substance Use Topics  . Smoking status: Never Smoker   . Smokeless tobacco: Not on file  . Alcohol Use: No     ROS:  Please see the history of present illness.    All other systems reviewed and negative.   Vital Signs: BP 128/72  Pulse 67  Ht 5\' 11"  (1.803 m)  Wt 169 lb 12.8 oz (77.021 kg)  BMI 23.68 kg/m2  PHYSICAL EXAM: Well nourished, well developed, in no acute  distress HEENT: normal Neck: no JVD Endocrine: no thyromegaly Cardiac:  normal S1, S2; RRR; no murmur Lungs:  clear to auscultation bilaterally, no wheezing, rhonchi or rales Abd: soft, nontender, no hepatomegaly Ext: 2+ bilateral pitting edema; Left > Right Skin: warm and dry Neuro:  CNs 2-12 intact, no focal abnormalities noted Psych: normal affect  EKG:   NSR, HR 65, normal axis, no ischemic changes, no change from prior tracing  ASSESSMENT AND PLAN:

## 2011-08-14 NOTE — Progress Notes (Signed)
Addended by: Tarri Fuller on: 08/14/2011 12:54 PM   Modules accepted: Orders

## 2011-08-14 NOTE — Assessment & Plan Note (Signed)
No angina  Continue ASA and Plavix 

## 2011-08-14 NOTE — Assessment & Plan Note (Signed)
Fairly dramatic edema.  But he insists this is common with prednisone use.  He has to use it often due to his h/o scleritis.  This started about 3 mos ago when he started prednisone for cataract surgery.  His dose has been decreased and he should be weaned off soon.  I will get some labs today: BMET, CBC, TSH, BNP, LFTs, DDimer and a U/A.  If BNP is up, I will get an echo.  If DDimer is abnormal, I will arrange venous dopplers to rule out DVT.  If he has protein in his urine, he will need 24 hour urine evaluation and follow up with his PCP.  There was no significant improvement with the Lasix.  No change in dose at this time.  I will set him up for TED hose.  Follow up in 6 weeks.  Hopefully, he will be off his prednisone at that time.  He knows to return sooner if needed.

## 2011-08-15 ENCOUNTER — Encounter (INDEPENDENT_AMBULATORY_CARE_PROVIDER_SITE_OTHER): Payer: Medicare Other | Admitting: *Deleted

## 2011-08-15 DIAGNOSIS — K148 Other diseases of tongue: Secondary | ICD-10-CM | POA: Insufficient documentation

## 2011-08-15 DIAGNOSIS — R609 Edema, unspecified: Secondary | ICD-10-CM

## 2011-08-15 NOTE — Assessment & Plan Note (Signed)
I have received a fax from the patient's dentist, Dr. Graylon Gunning.  [name] needs a lesion excised in the office from his tongue.    I have reviewed the patient's history with Dr.  Shawnie Pons and Dr. Verne Carrow. The patient's risk of late stent thrombosis is low but not insignficant.  He is 3 years out from his PCI.  Dr. Shea Evans notes that he could do the procedure on Plavix but off of ASA if possible.  Remaining on one dual antiplatelet agent would help reduce his risk.  As noted, the patient is able to achieve 4 METs without angina and has no unstable cardiac conditions.  He is at acceptable risk to proceed with his oral procedure.  He can hold the ASA for 5-7 days prior and restart once felt to be safe by Dr. Shea Evans.  He should remain on the Plavix.  I have discussed this with the patient today. Tereso Newcomer, PA-C  2:27 PM 08/15/2011

## 2011-08-17 ENCOUNTER — Other Ambulatory Visit: Payer: Self-pay | Admitting: Internal Medicine

## 2011-08-17 NOTE — Progress Notes (Signed)
Pt was notified of results

## 2011-08-30 ENCOUNTER — Other Ambulatory Visit: Payer: Self-pay | Admitting: Internal Medicine

## 2011-09-27 DIAGNOSIS — G4700 Insomnia, unspecified: Secondary | ICD-10-CM | POA: Diagnosis not present

## 2011-09-27 DIAGNOSIS — E785 Hyperlipidemia, unspecified: Secondary | ICD-10-CM | POA: Diagnosis not present

## 2011-09-27 DIAGNOSIS — L719 Rosacea, unspecified: Secondary | ICD-10-CM | POA: Diagnosis not present

## 2011-09-27 DIAGNOSIS — I10 Essential (primary) hypertension: Secondary | ICD-10-CM | POA: Diagnosis not present

## 2011-09-28 ENCOUNTER — Encounter: Payer: Self-pay | Admitting: Internal Medicine

## 2011-09-28 ENCOUNTER — Ambulatory Visit (INDEPENDENT_AMBULATORY_CARE_PROVIDER_SITE_OTHER): Payer: Medicare Other | Admitting: Internal Medicine

## 2011-09-28 DIAGNOSIS — R0989 Other specified symptoms and signs involving the circulatory and respiratory systems: Secondary | ICD-10-CM

## 2011-09-28 DIAGNOSIS — I2510 Atherosclerotic heart disease of native coronary artery without angina pectoris: Secondary | ICD-10-CM

## 2011-09-28 DIAGNOSIS — E785 Hyperlipidemia, unspecified: Secondary | ICD-10-CM | POA: Diagnosis not present

## 2011-09-28 DIAGNOSIS — I495 Sick sinus syndrome: Secondary | ICD-10-CM

## 2011-09-28 DIAGNOSIS — R609 Edema, unspecified: Secondary | ICD-10-CM | POA: Diagnosis not present

## 2011-09-28 DIAGNOSIS — I10 Essential (primary) hypertension: Secondary | ICD-10-CM

## 2011-09-28 NOTE — Assessment & Plan Note (Signed)
Followed by J Alllred.

## 2011-09-28 NOTE — Patient Instructions (Signed)
Your physician has requested that you have a carotid duplex. This test is an ultrasound of the carotid arteries in your neck. It looks at blood flow through these arteries that supply the brain with blood. Allow one hour for this exam. There are no restrictions or special instructions.  Your physician wants you to follow-up in: 12 months. You will receive a reminder letter in the mail two months in advance. If you don't receive a letter, please call our office to schedule the follow-up appointment.  

## 2011-09-28 NOTE — Assessment & Plan Note (Signed)
Adewquate control

## 2011-09-28 NOTE — Progress Notes (Signed)
HPIBobby A Romero is a 78 y.o. male who returns for follow up. He has a h/o CAD, s/p rotation atherectomy and DES to the RCA in 10/09 (LHC at that time: prox to mid RCA 90-95% then 90% - tx with PCI; dRCA 40-60%, LAD 70-80% after D2, 40-50% at D1, oD2 60-70%, oCFX 40%, OM1 80%, AVCFX 30-40%). Last echo 10/09: EF 60-65%. Other hx includes bradycardia, s/p pacer, HTN, hyperlipidemia, seizure hx, scleritis. He was seen by Dr. Hillis Range on 07/20/11 for routine EP follow up. He was recently put on prednisone for scleritis and noted BLE edema since. Also noted epistaxis and ASA was decreased to 81 mg QD. Lasix was started at 20 mg QD He was seen by Wende Mott in November. I saw him last in 2010.  In November he was on prednisone and had significant edema.  LAbs were done  Alb was minimally down but otherwise labs were not remarkable.  LE venous dopplers were neg for DVT.   Since seen he is now off of prednisone for about 4 wks.  LE edema improving No SOB, never has had. No CP. Walking.  Stays busy in yard. Allergies  Allergen Reactions  . Cyclosporine     Current Outpatient Prescriptions  Medication Sig Dispense Refill  . amLODipine (NORVASC) 2.5 MG tablet Take 5 mg by mouth daily.       Marland Kitchen aspirin 81 MG tablet Take 81 mg by mouth daily.       . beta carotene w/minerals (OCUVITE) tablet Take 1 tablet by mouth 2 (two) times daily.        . Calcium-Vitamin D (CALTRATE 600 PLUS-VIT D PO) Take by mouth 2 (two) times daily.        . Cholecalciferol (VITAMIN D3) 2000 UNITS TABS Take 2,000 Units by mouth daily.        . finasteride (PROSCAR) 5 MG tablet Take 5 mg by mouth daily.        . furosemide (LASIX) 20 MG tablet Take 1 tablet (20 mg total) by mouth daily.  30 tablet  11  . metoprolol tartrate (LOPRESSOR) 25 MG tablet TAKE ONE TABLET BY MOUTH TWICE DAILY  60 tablet  3  . metroNIDAZOLE (METROCREAM) 0.75 % cream Apply 1 application topically 2 (two) times daily as needed.        . Multiple  Vitamins-Minerals (CENTRUM SILVER PO) Take by mouth daily.        . Multiple Vitamins-Minerals (ICAPS) CAPS Take by mouth 2 (two) times daily.        Marland Kitchen NITROSTAT 0.4 MG SL tablet DISSOLVE ONE TABLET UNDER THE TONGUE EVERY 5 MINUTES AS NEEDED FOR CHEST PAIN.  DO NOT EXCEED A TOTAL OF 3 DOSES IN 15 MINUTES WITHOUT  SEEK  25 each  12  . Omega-3 Fatty Acids (FISH OIL) 1000 MG CAPS Take 1,000 mg by mouth daily.        Marland Kitchen OVER THE COUNTER MEDICATION as needed. Stool softner       . PLAVIX 75 MG tablet TAKE ONE TABLET BY MOUTH EVERY DAY  90 each  0  . potassium chloride (K-DUR) 10 MEQ tablet Take 2 tablets (20 mEq total) by mouth daily.  30 tablet  11  . simvastatin (ZOCOR) 40 MG tablet Take 20 mg by mouth.       . TEMAZEPAM PO Take 30 mg by mouth Nightly.          Past Medical History  Diagnosis Date  .  Symptomatic bradycardia     s/p pacemaker  . Coronary artery disease     PCI RCA 2009  . Hypertension   . Dyslipidemia   . Seizure     felt due to cyclosporin in setting of electrolyte disorder  . Scleritis, unspecified     treated with prednisone  . Solitary kidney     Past Surgical History  Procedure Date  . Pacemaker insertion     SJM    No family history on file.  History   Social History  . Marital Status: Married    Spouse Name: N/A    Number of Children: N/A  . Years of Education: N/A   Occupational History  . Not on file.   Social History Main Topics  . Smoking status: Never Smoker   . Smokeless tobacco: Not on file  . Alcohol Use: No  . Drug Use: No  . Sexually Active: Not on file   Other Topics Concern  . Not on file   Social History Narrative   MarriedTobaccoetoh    Review of Systems:  All systems reviewed.  They are negative to the above problem except as previously stated.  Vital Signs: BP 130/65  Pulse 59  Ht 5\' 10"  (1.778 m)  Wt 167 lb (75.751 kg)  BMI 23.96 kg/m2  Physical Exam Patient is in NAD  HEENT:  Normocephalic, atraumatic. EOMI,  PERRLA.  Neck: JVP is normal. No thyromegaly. L carotid bruit. Lungs: clear to auscultation. No rales no wheezes.  Heart: Regular rate and rhythm. Normal S1, S2. No S3.   No significant murmurs. PMI not displaced.  Abdomen:  Supple, nontender. Normal bowel sounds. No masses. No hepatomegaly.  Extremities:   Good distal pulses throughout. Tr lower extremity edema.  Musculoskeletal :moving all extremities.  Neuro:   alert and oriented x3.  CN II-XII grossly intact.   Assessment and Plan:

## 2011-09-28 NOTE — Assessment & Plan Note (Signed)
WIll need to review recent lipids.

## 2011-09-28 NOTE — Assessment & Plan Note (Signed)
Improved off of steroids.

## 2011-09-28 NOTE — Assessment & Plan Note (Signed)
Patient asymptomatic.  Volume status looks good.  I would keep on same regimen.

## 2011-09-28 NOTE — Assessment & Plan Note (Signed)
Will get carotid USN.

## 2011-10-16 ENCOUNTER — Encounter (INDEPENDENT_AMBULATORY_CARE_PROVIDER_SITE_OTHER): Payer: Medicare Other | Admitting: *Deleted

## 2011-10-16 DIAGNOSIS — I2510 Atherosclerotic heart disease of native coronary artery without angina pectoris: Secondary | ICD-10-CM

## 2011-10-16 DIAGNOSIS — I6529 Occlusion and stenosis of unspecified carotid artery: Secondary | ICD-10-CM

## 2011-10-16 DIAGNOSIS — R0989 Other specified symptoms and signs involving the circulatory and respiratory systems: Secondary | ICD-10-CM | POA: Diagnosis not present

## 2011-10-23 ENCOUNTER — Encounter: Payer: Self-pay | Admitting: Internal Medicine

## 2011-10-23 DIAGNOSIS — I498 Other specified cardiac arrhythmias: Secondary | ICD-10-CM

## 2011-10-23 DIAGNOSIS — I495 Sick sinus syndrome: Secondary | ICD-10-CM | POA: Diagnosis not present

## 2011-11-18 DIAGNOSIS — J069 Acute upper respiratory infection, unspecified: Secondary | ICD-10-CM | POA: Diagnosis not present

## 2011-11-27 DIAGNOSIS — Z85828 Personal history of other malignant neoplasm of skin: Secondary | ICD-10-CM | POA: Diagnosis not present

## 2011-11-27 DIAGNOSIS — L539 Erythematous condition, unspecified: Secondary | ICD-10-CM | POA: Diagnosis not present

## 2011-12-11 ENCOUNTER — Other Ambulatory Visit: Payer: Self-pay | Admitting: Internal Medicine

## 2012-01-19 ENCOUNTER — Other Ambulatory Visit: Payer: Self-pay | Admitting: Internal Medicine

## 2012-01-22 ENCOUNTER — Encounter: Payer: Self-pay | Admitting: Internal Medicine

## 2012-01-22 DIAGNOSIS — I498 Other specified cardiac arrhythmias: Secondary | ICD-10-CM | POA: Diagnosis not present

## 2012-01-22 DIAGNOSIS — I495 Sick sinus syndrome: Secondary | ICD-10-CM | POA: Diagnosis not present

## 2012-02-06 ENCOUNTER — Other Ambulatory Visit: Payer: Self-pay | Admitting: Dermatology

## 2012-02-06 DIAGNOSIS — L578 Other skin changes due to chronic exposure to nonionizing radiation: Secondary | ICD-10-CM | POA: Diagnosis not present

## 2012-02-06 DIAGNOSIS — C44320 Squamous cell carcinoma of skin of unspecified parts of face: Secondary | ICD-10-CM | POA: Diagnosis not present

## 2012-02-06 DIAGNOSIS — Z85828 Personal history of other malignant neoplasm of skin: Secondary | ICD-10-CM | POA: Diagnosis not present

## 2012-02-06 DIAGNOSIS — D0430 Carcinoma in situ of skin of unspecified part of face: Secondary | ICD-10-CM | POA: Diagnosis not present

## 2012-02-06 DIAGNOSIS — D0439 Carcinoma in situ of skin of other parts of face: Secondary | ICD-10-CM | POA: Diagnosis not present

## 2012-02-14 DIAGNOSIS — C44320 Squamous cell carcinoma of skin of unspecified parts of face: Secondary | ICD-10-CM | POA: Diagnosis not present

## 2012-03-26 ENCOUNTER — Encounter: Payer: Self-pay | Admitting: Internal Medicine

## 2012-03-26 DIAGNOSIS — L719 Rosacea, unspecified: Secondary | ICD-10-CM | POA: Diagnosis not present

## 2012-03-26 DIAGNOSIS — G479 Sleep disorder, unspecified: Secondary | ICD-10-CM | POA: Diagnosis not present

## 2012-03-26 DIAGNOSIS — E785 Hyperlipidemia, unspecified: Secondary | ICD-10-CM | POA: Diagnosis not present

## 2012-03-26 DIAGNOSIS — I10 Essential (primary) hypertension: Secondary | ICD-10-CM | POA: Diagnosis not present

## 2012-04-03 DIAGNOSIS — H2510 Age-related nuclear cataract, unspecified eye: Secondary | ICD-10-CM | POA: Diagnosis not present

## 2012-04-03 DIAGNOSIS — H17829 Peripheral opacity of cornea, unspecified eye: Secondary | ICD-10-CM | POA: Diagnosis not present

## 2012-04-22 DIAGNOSIS — I498 Other specified cardiac arrhythmias: Secondary | ICD-10-CM

## 2012-04-22 DIAGNOSIS — I495 Sick sinus syndrome: Secondary | ICD-10-CM | POA: Diagnosis not present

## 2012-05-03 ENCOUNTER — Other Ambulatory Visit: Payer: Self-pay | Admitting: *Deleted

## 2012-05-03 DIAGNOSIS — I6529 Occlusion and stenosis of unspecified carotid artery: Secondary | ICD-10-CM

## 2012-05-07 ENCOUNTER — Encounter (INDEPENDENT_AMBULATORY_CARE_PROVIDER_SITE_OTHER): Payer: Medicare Other

## 2012-05-07 DIAGNOSIS — I6529 Occlusion and stenosis of unspecified carotid artery: Secondary | ICD-10-CM

## 2012-05-22 ENCOUNTER — Other Ambulatory Visit: Payer: Self-pay | Admitting: *Deleted

## 2012-05-22 MED ORDER — SIMVASTATIN 40 MG PO TABS: 20.0000 mg | ORAL_TABLET | Freq: Every day | ORAL | Status: DC

## 2012-05-27 DIAGNOSIS — N400 Enlarged prostate without lower urinary tract symptoms: Secondary | ICD-10-CM | POA: Diagnosis not present

## 2012-05-27 DIAGNOSIS — N269 Renal sclerosis, unspecified: Secondary | ICD-10-CM | POA: Diagnosis not present

## 2012-06-05 ENCOUNTER — Other Ambulatory Visit: Payer: Self-pay

## 2012-06-05 DIAGNOSIS — E876 Hypokalemia: Secondary | ICD-10-CM

## 2012-06-05 MED ORDER — POTASSIUM CHLORIDE ER 10 MEQ PO TBCR: 20.0000 meq | EXTENDED_RELEASE_TABLET | Freq: Every day | ORAL | Status: DC

## 2012-06-06 DIAGNOSIS — Z23 Encounter for immunization: Secondary | ICD-10-CM | POA: Diagnosis not present

## 2012-06-10 ENCOUNTER — Other Ambulatory Visit: Payer: Self-pay | Admitting: *Deleted

## 2012-06-10 DIAGNOSIS — E876 Hypokalemia: Secondary | ICD-10-CM

## 2012-06-10 MED ORDER — POTASSIUM CHLORIDE ER 10 MEQ PO TBCR: 20.0000 meq | EXTENDED_RELEASE_TABLET | Freq: Every day | ORAL | Status: DC

## 2012-06-10 NOTE — Telephone Encounter (Signed)
Pt called stating that is rx was sent in wrong. Pt was instructed to taking of his K-Dur BID and only 30 tablets was dispensed. Sent in 60 tablets to pharmacy. Fax Received. Refill Completed. Glenette Bookwalter Chowoe (R.M.A)

## 2012-07-01 ENCOUNTER — Telehealth: Payer: Self-pay | Admitting: *Deleted

## 2012-07-01 NOTE — Telephone Encounter (Signed)
LEFT MESSAGE  FOR [facility]  TO CALL BACK RE NEEDING RECENT LIPID  PANEL PER DR ROSS'S REQUEST .Zack Seal

## 2012-07-02 ENCOUNTER — Other Ambulatory Visit: Payer: Self-pay | Admitting: Cardiology

## 2012-07-02 DIAGNOSIS — I498 Other specified cardiac arrhythmias: Secondary | ICD-10-CM

## 2012-07-02 DIAGNOSIS — I2510 Atherosclerotic heart disease of native coronary artery without angina pectoris: Secondary | ICD-10-CM

## 2012-07-02 DIAGNOSIS — I495 Sick sinus syndrome: Secondary | ICD-10-CM

## 2012-07-02 DIAGNOSIS — I10 Essential (primary) hypertension: Secondary | ICD-10-CM

## 2012-07-02 DIAGNOSIS — R609 Edema, unspecified: Secondary | ICD-10-CM

## 2012-07-02 MED ORDER — FUROSEMIDE 20 MG PO TABS: 20.0000 mg | ORAL_TABLET | Freq: Every day | ORAL | Status: DC

## 2012-07-03 ENCOUNTER — Encounter: Payer: Self-pay | Admitting: *Deleted

## 2012-07-03 DIAGNOSIS — Z950 Presence of cardiac pacemaker: Secondary | ICD-10-CM | POA: Insufficient documentation

## 2012-07-11 ENCOUNTER — Ambulatory Visit (INDEPENDENT_AMBULATORY_CARE_PROVIDER_SITE_OTHER): Payer: Medicare Other | Admitting: Internal Medicine

## 2012-07-11 ENCOUNTER — Encounter: Payer: Self-pay | Admitting: Internal Medicine

## 2012-07-11 ENCOUNTER — Telehealth: Payer: Self-pay | Admitting: *Deleted

## 2012-07-11 VITALS — BP 112/64 | HR 70 | Ht 71.0 in | Wt 153.0 lb

## 2012-07-11 DIAGNOSIS — I495 Sick sinus syndrome: Secondary | ICD-10-CM

## 2012-07-11 DIAGNOSIS — Z950 Presence of cardiac pacemaker: Secondary | ICD-10-CM | POA: Diagnosis not present

## 2012-07-11 LAB — PACEMAKER DEVICE OBSERVATION
AL AMPLITUDE: 2.1 mv
AL IMPEDENCE PM: 423 Ohm
AL THRESHOLD: 0.75 V
ATRIAL PACING PM: 40
BAMS-0001: 150 {beats}/min
BAMS-0003: 70 {beats}/min
BATTERY VOLTAGE: 2.79 V
DEVICE MODEL PM: 2231585
RV LEAD AMPLITUDE: 12 mv
RV LEAD IMPEDENCE PM: 390 Ohm
RV LEAD THRESHOLD: 1.125 V
VENTRICULAR PACING PM: 1

## 2012-07-11 NOTE — Patient Instructions (Signed)
Your physician wants you to follow-up in: 1 year with Dr Allred.  You will receive a reminder letter in the mail two months in advance. If you don't receive a letter, please call our office to schedule the follow-up appointment.  

## 2012-07-11 NOTE — Telephone Encounter (Signed)
Called patient to advise him that Dr.Ross reviewed his Lipid panel from [facility] and total cholesterol was 149. Level was very good so no medication changes.

## 2012-07-11 NOTE — Progress Notes (Signed)
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  Since last being seen in our clinic, the patient reports doing very well.  Today, he denies symptoms of palpitations, chest pain, shortness of breath,  lower extremity edema, dizziness, presyncope, or syncope.  The patient is otherwise without complaint today.   Past Medical History  Diagnosis Date  . Symptomatic bradycardia     s/p pacemaker  . Coronary artery disease     PCI RCA 2009  . Hypertension   . Dyslipidemia   . Seizure     felt due to cyclosporin in setting of electrolyte disorder  . Scleritis, unspecified     treated with prednisone  . Solitary kidney    Past Surgical History  Procedure Date  . Pacemaker insertion     SJM    Current Outpatient Prescriptions  Medication Sig Dispense Refill  . amLODipine (NORVASC) 2.5 MG tablet Take 5 mg by mouth daily.       Marland Kitchen aspirin 81 MG tablet Take 81 mg by mouth daily.       . beta carotene w/minerals (OCUVITE) tablet Take 1 tablet by mouth 2 (two) times daily.        . Calcium-Vitamin D (CALTRATE 600 PLUS-VIT D PO) Take by mouth 2 (two) times daily.        . clopidogrel (PLAVIX) 75 MG tablet TAKE ONE TABLET BY MOUTH EVERY DAY  90 tablet  3  . finasteride (PROSCAR) 5 MG tablet Take 5 mg by mouth daily.        . furosemide (LASIX) 20 MG tablet Take 1 tablet (20 mg total) by mouth daily.  30 tablet  1  . metoprolol tartrate (LOPRESSOR) 25 MG tablet TAKE ONE TABLET BY MOUTH TWICE DAILY  60 tablet  7  . metroNIDAZOLE (METROCREAM) 0.75 % cream Apply 1 application topically 2 (two) times daily as needed.        . Multiple Vitamins-Minerals (CENTRUM SILVER PO) Take by mouth daily.        Marland Kitchen NITROSTAT 0.4 MG SL tablet DISSOLVE ONE TABLET UNDER THE TONGUE EVERY 5 MINUTES AS NEEDED FOR CHEST PAIN.  DO NOT EXCEED A TOTAL OF 3 DOSES IN 15 MINUTES WITHOUT  SEEK  25 each  12  . OVER THE COUNTER  MEDICATION as needed. Stool softner       . potassium chloride (K-DUR) 10 MEQ tablet Take 2 tablets (20 mEq total) by mouth daily.  60 tablet  11  . simvastatin (ZOCOR) 40 MG tablet Take 0.5 tablets (20 mg total) by mouth at bedtime.  30 tablet  2  . TEMAZEPAM PO Take 30 mg by mouth Nightly.          Physical Exam: Filed Vitals:   07/11/12 0934  BP: 112/64  Pulse: 70  Height: 5\' 11"  (1.803 m)  Weight: 153 lb (69.4 kg)    GEN- The patient is well appearing, alert and oriented x 3 today.   Head- normocephalic, atraumatic Eyes-  Sclera clear, conjunctiva pink Ears- hearing intact Oropharynx- clear Lungs- Clear to ausculation bilaterally, normal work of breathing Chest- pacemaker pocket is well healed Heart- Regular rate and rhythm, no murmurs, rubs or gallops, PMI not laterally displaced GI- soft, NT, ND, + BS Extremities- no clubbing, cyanosis, or edema  Pacemaker interrogation- reviewed in detail today,  See PACEART report  Assessment and Plan:  1. Bradycardia Normal  pacemaker function Atrial sensing (bipolar) is decreased, but normal unipolar Atrial sensing is therefore changed to unipolar See Pace Art report Will obtain a CXR to make sure lead is stable. Impedance/ threshold are unchanged

## 2012-07-23 ENCOUNTER — Ambulatory Visit (INDEPENDENT_AMBULATORY_CARE_PROVIDER_SITE_OTHER)
Admission: RE | Admit: 2012-07-23 | Discharge: 2012-07-23 | Disposition: A | Discharge status: Home / Self Care | Payer: Medicare Other | Source: Ambulatory Visit | Attending: Internal Medicine | Admitting: Internal Medicine

## 2012-07-23 DIAGNOSIS — I495 Sick sinus syndrome: Secondary | ICD-10-CM

## 2012-07-23 DIAGNOSIS — Z950 Presence of cardiac pacemaker: Secondary | ICD-10-CM

## 2012-07-30 ENCOUNTER — Other Ambulatory Visit: Payer: Self-pay | Admitting: Internal Medicine

## 2012-08-09 DIAGNOSIS — Z85828 Personal history of other malignant neoplasm of skin: Secondary | ICD-10-CM | POA: Diagnosis not present

## 2012-08-09 DIAGNOSIS — L578 Other skin changes due to chronic exposure to nonionizing radiation: Secondary | ICD-10-CM | POA: Diagnosis not present

## 2012-08-19 DIAGNOSIS — H2510 Age-related nuclear cataract, unspecified eye: Secondary | ICD-10-CM | POA: Diagnosis not present

## 2012-08-19 DIAGNOSIS — Z961 Presence of intraocular lens: Secondary | ICD-10-CM | POA: Diagnosis not present

## 2012-08-23 ENCOUNTER — Telehealth: Payer: Self-pay | Admitting: Internal Medicine

## 2012-08-23 NOTE — Telephone Encounter (Signed)
FYI:   Pt called to inform Dr. Tenny Craw he will be having Eye surgery on (Left Eye) 08/29/12 @ [facility]. Pt can be reached at hm# for additional information

## 2012-08-29 DIAGNOSIS — Z888 Allergy status to other drugs, medicaments and biological substances status: Secondary | ICD-10-CM | POA: Diagnosis not present

## 2012-08-29 DIAGNOSIS — I2510 Atherosclerotic heart disease of native coronary artery without angina pectoris: Secondary | ICD-10-CM | POA: Diagnosis not present

## 2012-08-29 DIAGNOSIS — H2589 Other age-related cataract: Secondary | ICD-10-CM | POA: Diagnosis not present

## 2012-08-29 DIAGNOSIS — Z7982 Long term (current) use of aspirin: Secondary | ICD-10-CM | POA: Diagnosis not present

## 2012-08-29 DIAGNOSIS — Z7902 Long term (current) use of antithrombotics/antiplatelets: Secondary | ICD-10-CM | POA: Diagnosis not present

## 2012-08-29 DIAGNOSIS — Z950 Presence of cardiac pacemaker: Secondary | ICD-10-CM | POA: Diagnosis not present

## 2012-08-29 DIAGNOSIS — Z947 Corneal transplant status: Secondary | ICD-10-CM | POA: Diagnosis not present

## 2012-08-29 DIAGNOSIS — Z9849 Cataract extraction status, unspecified eye: Secondary | ICD-10-CM | POA: Diagnosis not present

## 2012-08-29 DIAGNOSIS — I10 Essential (primary) hypertension: Secondary | ICD-10-CM | POA: Diagnosis not present

## 2012-08-29 DIAGNOSIS — Z85828 Personal history of other malignant neoplasm of skin: Secondary | ICD-10-CM | POA: Diagnosis not present

## 2012-08-29 DIAGNOSIS — Z9861 Coronary angioplasty status: Secondary | ICD-10-CM | POA: Diagnosis not present

## 2012-08-29 DIAGNOSIS — E785 Hyperlipidemia, unspecified: Secondary | ICD-10-CM | POA: Diagnosis not present

## 2012-08-29 DIAGNOSIS — H2510 Age-related nuclear cataract, unspecified eye: Secondary | ICD-10-CM | POA: Diagnosis not present

## 2012-08-30 ENCOUNTER — Other Ambulatory Visit: Payer: Self-pay | Admitting: *Deleted

## 2012-08-30 DIAGNOSIS — I2510 Atherosclerotic heart disease of native coronary artery without angina pectoris: Secondary | ICD-10-CM

## 2012-08-30 DIAGNOSIS — I495 Sick sinus syndrome: Secondary | ICD-10-CM

## 2012-08-30 DIAGNOSIS — R609 Edema, unspecified: Secondary | ICD-10-CM

## 2012-08-30 DIAGNOSIS — I10 Essential (primary) hypertension: Secondary | ICD-10-CM

## 2012-08-30 DIAGNOSIS — I498 Other specified cardiac arrhythmias: Secondary | ICD-10-CM

## 2012-08-30 MED ORDER — FUROSEMIDE 20 MG PO TABS: 20.0000 mg | ORAL_TABLET | Freq: Every day | ORAL | Status: DC

## 2012-09-26 ENCOUNTER — Encounter: Payer: Self-pay | Admitting: Internal Medicine

## 2012-09-26 DIAGNOSIS — L719 Rosacea, unspecified: Secondary | ICD-10-CM | POA: Diagnosis not present

## 2012-09-26 DIAGNOSIS — N400 Enlarged prostate without lower urinary tract symptoms: Secondary | ICD-10-CM | POA: Diagnosis not present

## 2012-09-26 DIAGNOSIS — E785 Hyperlipidemia, unspecified: Secondary | ICD-10-CM | POA: Diagnosis not present

## 2012-09-26 DIAGNOSIS — G479 Sleep disorder, unspecified: Secondary | ICD-10-CM | POA: Diagnosis not present

## 2012-09-26 DIAGNOSIS — I10 Essential (primary) hypertension: Secondary | ICD-10-CM | POA: Diagnosis not present

## 2012-10-14 ENCOUNTER — Encounter: Payer: Self-pay | Admitting: Internal Medicine

## 2012-10-14 ENCOUNTER — Ambulatory Visit (INDEPENDENT_AMBULATORY_CARE_PROVIDER_SITE_OTHER): Payer: Medicare Other | Admitting: Internal Medicine

## 2012-10-14 VITALS — BP 150/70 | HR 60 | Ht 70.0 in | Wt 158.0 lb

## 2012-10-14 DIAGNOSIS — I25810 Atherosclerosis of coronary artery bypass graft(s) without angina pectoris: Secondary | ICD-10-CM | POA: Diagnosis not present

## 2012-10-14 NOTE — Progress Notes (Signed)
HPI Patinet is an 79 yo with a history of CADs/p rotation atherectomy and DES to the RCA in 10/09 (LHC at that time: prox to mid RCA 90-95% then 90% - tx with PCI; dRCA 40-60%, LAD 70-80% after D2, 40-50% at D1, oD2 60-70%, oCFX 40%, OM1 80%, AVCFX 30-40%). Last echo 10/09: EF 60-65%. Other hx includes bradycardia, s/p pacer, HTN, hyperlipidemia, seizure  D (s/p PCI to RCA 2009), HTN, HL  I saw him in cl[place] in Jan 2013.  He was seen by J Allred in [place] last fall. Last lipid panel a few wks ago LDL was 70, HDL was 62  Since seen he has done well.  NO CP  Breathing is OK BP is good at home  Admits to "white coat" effect Allergies  Allergen Reactions  . Cyclosporine     Current Outpatient Prescriptions  Medication Sig Dispense Refill  . amLODipine (NORVASC) 2.5 MG tablet Take 5 mg by mouth daily.       Marland Kitchen amLODipine (NORVASC) 5 MG tablet TAKE ONE TABLET BY MOUTH EVERY DAY  30 tablet  1  . aspirin 81 MG tablet Take 81 mg by mouth daily.       . beta carotene w/minerals (OCUVITE) tablet Take 1 tablet by mouth 2 (two) times daily.        . Calcium-Vitamin D (CALTRATE 600 PLUS-VIT D PO) Take by mouth 2 (two) times daily.        . clopidogrel (PLAVIX) 75 MG tablet TAKE ONE TABLET BY MOUTH EVERY DAY  90 tablet  3  . finasteride (PROSCAR) 5 MG tablet Take 5 mg by mouth daily.        . furosemide (LASIX) 20 MG tablet Take 1 tablet (20 mg total) by mouth daily.  90 tablet  2  . metoprolol tartrate (LOPRESSOR) 25 MG tablet TAKE ONE TABLET BY MOUTH TWICE DAILY  60 tablet  7  . metroNIDAZOLE (METROCREAM) 0.75 % cream Apply 1 application topically 2 (two) times daily as needed.        . Multiple Vitamins-Minerals (CENTRUM SILVER PO) Take by mouth daily.        Marland Kitchen NITROSTAT 0.4 MG SL tablet DISSOLVE ONE TABLET UNDER THE TONGUE EVERY 5 MINUTES AS NEEDED FOR CHEST PAIN.  DO NOT EXCEED A TOTAL OF 3 DOSES IN 15 MINUTES WITHOUT  SEEK  25 each  12  . OVER THE COUNTER MEDICATION as needed. Stool softner       .  potassium chloride (K-DUR) 10 MEQ tablet Take 2 tablets (20 mEq total) by mouth daily.  60 tablet  11  . simvastatin (ZOCOR) 40 MG tablet Take 0.5 tablets (20 mg total) by mouth at bedtime.  30 tablet  2  . TEMAZEPAM PO Take 30 mg by mouth Nightly.          Past Medical History  Diagnosis Date  . Symptomatic bradycardia     s/p pacemaker  . Coronary artery disease     PCI RCA 2009  . Hypertension   . Dyslipidemia   . Seizure     felt due to cyclosporin in setting of electrolyte disorder  . Scleritis, unspecified     treated with prednisone  . Solitary kidney     Past Surgical History  Procedure Date  . Pacemaker insertion     SJM    No family history on file.  History   Social History  . Marital Status: Married    Spouse Name: N/A  Number of Children: N/A  . Years of Education: N/A   Occupational History  . Not on file.   Social History Main Topics  . Smoking status: Never Smoker   . Smokeless tobacco: Not on file  . Alcohol Use: No  . Drug Use: No  . Sexually Active: Not on file   Other Topics Concern  . Not on file   Social History Narrative   MarriedTobaccoetoh    Review of Systems:  All systems reviewed.  They are negative to the above problem except as previously stated.  Vital Signs: BP 150/70  P 60  Wt 158  Physical Exam Patient is in NAD HEENT:  Normocephalic, atraumatic. EOMI, PERRLA.  Neck: JVP is normal.  No bruits.  Lungs: clear to auscultation. No rales no wheezes.  Heart: Regular rate and rhythm. Normal S1, S2. No S3.   No significant murmurs. PMI not displaced.  Abdomen:  Supple, nontender. Normal bowel sounds. No masses. No hepatomegaly.  Extremities:   Good distal pulses throughout. No lower extremity edema.  Musculoskeletal :moving all extremities.  Neuro:   alert and oriented x3.  CN II-XII grossly intact.  EKG  Atrial paced.  60 bpm.  Assessment and Plan:  1.  CAD  Doing well  No symptoms  2.  HTN  Patient will keep track  of BP at home  Bring cuff into clinic visits to make sure accurate  3.  HL  Contineu statin  Lipids are excelllent  Continue.

## 2012-10-28 ENCOUNTER — Other Ambulatory Visit: Payer: Self-pay | Admitting: Internal Medicine

## 2012-11-04 ENCOUNTER — Other Ambulatory Visit: Payer: Self-pay | Admitting: Internal Medicine

## 2012-11-05 ENCOUNTER — Other Ambulatory Visit: Payer: Self-pay | Admitting: Emergency Medicine

## 2012-11-05 MED ORDER — SIMVASTATIN 40 MG PO TABS: 20.0000 mg | ORAL_TABLET | Freq: Every day | ORAL | Status: DC

## 2012-12-13 ENCOUNTER — Other Ambulatory Visit: Payer: Self-pay | Admitting: Internal Medicine

## 2012-12-13 DIAGNOSIS — H2640 Unspecified secondary cataract: Secondary | ICD-10-CM | POA: Diagnosis not present

## 2012-12-31 ENCOUNTER — Other Ambulatory Visit: Payer: Self-pay | Admitting: Internal Medicine

## 2013-01-08 DIAGNOSIS — I498 Other specified cardiac arrhythmias: Secondary | ICD-10-CM

## 2013-01-08 DIAGNOSIS — I495 Sick sinus syndrome: Secondary | ICD-10-CM | POA: Diagnosis not present

## 2013-02-20 ENCOUNTER — Encounter: Payer: Self-pay | Admitting: Internal Medicine

## 2013-02-27 ENCOUNTER — Other Ambulatory Visit: Payer: Self-pay | Admitting: Internal Medicine

## 2013-04-08 ENCOUNTER — Other Ambulatory Visit: Payer: Self-pay | Admitting: Internal Medicine

## 2013-04-09 DIAGNOSIS — Z950 Presence of cardiac pacemaker: Secondary | ICD-10-CM | POA: Diagnosis not present

## 2013-04-09 DIAGNOSIS — R55 Syncope and collapse: Secondary | ICD-10-CM | POA: Diagnosis not present

## 2013-04-09 DIAGNOSIS — I498 Other specified cardiac arrhythmias: Secondary | ICD-10-CM

## 2013-04-22 ENCOUNTER — Other Ambulatory Visit: Payer: Self-pay | Admitting: Internal Medicine

## 2013-05-09 ENCOUNTER — Other Ambulatory Visit: Payer: Self-pay | Admitting: Internal Medicine

## 2013-05-26 ENCOUNTER — Other Ambulatory Visit: Payer: Self-pay | Admitting: Internal Medicine

## 2013-06-17 ENCOUNTER — Other Ambulatory Visit: Payer: Self-pay | Admitting: Internal Medicine

## 2013-06-30 ENCOUNTER — Other Ambulatory Visit: Payer: Self-pay | Admitting: Internal Medicine

## 2013-07-04 ENCOUNTER — Encounter: Payer: Self-pay | Admitting: Internal Medicine

## 2013-07-04 DIAGNOSIS — I10 Essential (primary) hypertension: Secondary | ICD-10-CM | POA: Diagnosis not present

## 2013-07-04 DIAGNOSIS — N400 Enlarged prostate without lower urinary tract symptoms: Secondary | ICD-10-CM | POA: Diagnosis not present

## 2013-07-04 DIAGNOSIS — L719 Rosacea, unspecified: Secondary | ICD-10-CM | POA: Diagnosis not present

## 2013-07-04 DIAGNOSIS — G479 Sleep disorder, unspecified: Secondary | ICD-10-CM | POA: Diagnosis not present

## 2013-07-04 DIAGNOSIS — Z23 Encounter for immunization: Secondary | ICD-10-CM | POA: Diagnosis not present

## 2013-07-04 DIAGNOSIS — E785 Hyperlipidemia, unspecified: Secondary | ICD-10-CM | POA: Diagnosis not present

## 2013-07-08 DIAGNOSIS — L570 Actinic keratosis: Secondary | ICD-10-CM | POA: Diagnosis not present

## 2013-07-08 DIAGNOSIS — Z85828 Personal history of other malignant neoplasm of skin: Secondary | ICD-10-CM | POA: Diagnosis not present

## 2013-07-14 DIAGNOSIS — Z961 Presence of intraocular lens: Secondary | ICD-10-CM | POA: Diagnosis not present

## 2013-07-17 ENCOUNTER — Encounter: Payer: Self-pay | Admitting: Internal Medicine

## 2013-07-17 ENCOUNTER — Ambulatory Visit (INDEPENDENT_AMBULATORY_CARE_PROVIDER_SITE_OTHER): Payer: Medicare Other | Admitting: Internal Medicine

## 2013-07-17 VITALS — BP 128/70 | HR 60 | Ht 68.0 in | Wt 164.0 lb

## 2013-07-17 DIAGNOSIS — Z950 Presence of cardiac pacemaker: Secondary | ICD-10-CM

## 2013-07-17 DIAGNOSIS — I495 Sick sinus syndrome: Secondary | ICD-10-CM | POA: Diagnosis not present

## 2013-07-17 DIAGNOSIS — I10 Essential (primary) hypertension: Secondary | ICD-10-CM | POA: Diagnosis not present

## 2013-07-17 DIAGNOSIS — R609 Edema, unspecified: Secondary | ICD-10-CM | POA: Diagnosis not present

## 2013-07-17 LAB — PACEMAKER DEVICE OBSERVATION
AL AMPLITUDE: 1.5 mv
AL IMPEDENCE PM: 410 Ohm
AL THRESHOLD: 0.75 V
ATRIAL PACING PM: 53
BAMS-0001: 150 {beats}/min
BAMS-0003: 70 {beats}/min
BATTERY VOLTAGE: 2.79 V
DEVICE MODEL PM: 2231585
RV LEAD AMPLITUDE: 12 mv
RV LEAD IMPEDENCE PM: 379 Ohm
RV LEAD THRESHOLD: 1.125 V
VENTRICULAR PACING PM: 4.1

## 2013-07-17 MED ORDER — FUROSEMIDE 20 MG PO TABS: ORAL_TABLET | ORAL | Status: DC

## 2013-07-17 NOTE — Patient Instructions (Addendum)
Please have [employer] call you in 90 days for your next transmission.  Your physician wants you to follow-up in: 12 months with Dr Jacquiline Doe will receive a reminder letter in the mail two months in advance. If you don't receive a letter, please call our office to schedule the follow-up appointment.  Weigh yourself daily and record  Your physician has recommended you make the following change in your medication:  1) Decrease Furosemide to every other day

## 2013-07-17 NOTE — Progress Notes (Signed)
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  Since last being seen in our clinic, the patient reports doing very well.  Today, he denies symptoms of palpitations, chest pain, shortness of breath,  lower extremity edema, dizziness, presyncope, or syncope.  The patient is otherwise without complaint today.   Past Medical History  Diagnosis Date  . Symptomatic bradycardia     s/p pacemaker  . Coronary artery disease     PCI RCA 2009  . Hypertension   . Dyslipidemia   . Seizure     felt due to cyclosporin in setting of electrolyte disorder  . Scleritis, unspecified     treated with prednisone  . Solitary kidney    Past Surgical History  Procedure Laterality Date  . Pacemaker insertion      SJM    Current Outpatient Prescriptions  Medication Sig Dispense Refill  . amLODipine (NORVASC) 5 MG tablet TAKE 1/2 TABLET BY MOUTH ONCE DAILY      . aspirin 81 MG tablet Take 81 mg by mouth daily.       . Calcium-Vitamin D (CALTRATE 600 PLUS-VIT D PO) Take by mouth 2 (two) times daily.        . clopidogrel (PLAVIX) 75 MG tablet TAKE ONE TABLET BY MOUTH ONCE DAILY  90 tablet  0  . finasteride (PROSCAR) 5 MG tablet Take 5 mg by mouth daily.        . fish oil-omega-3 fatty acids 1000 MG capsule Take 1 capsule 3 days a week.      . furosemide (LASIX) 20 MG tablet TAKE ONE TABLET BY MOUTH EVERY DAY  90 tablet  3  . metoprolol tartrate (LOPRESSOR) 25 MG tablet TAKE ONE TABLET BY MOUTH TWICE DAILY  60 tablet  10  . metroNIDAZOLE (METROCREAM) 0.75 % cream Apply 1 application topically 2 (two) times daily as needed.        . Multiple Vitamins-Minerals (CENTRUM SILVER PO) Take by mouth daily.        . Multiple Vitamins-Minerals (OCUVITE ADULT 50+) CAPS Take 1 capsule by mouth daily.      Marland Kitchen NITROSTAT 0.4 MG SL tablet DISSOLVE ONE TABLET UNDER THE TONGUE EVERY 5 MINUTES AS NEEDED FOR CHEST PAIN.  DO NOT  EXCEED A TOTAL OF 3 DOSES IN 15 MINUTES WITHOUT  SEEK  25 each  12  . OVER THE COUNTER MEDICATION as needed. Stool softner       . potassium chloride (K-DUR) 10 MEQ tablet Take 10 mEq by mouth 2 (two) times daily.      . simvastatin (ZOCOR) 40 MG tablet TAKE ONE-HALF TABLET BY MOUTH AT BEDTIME  30 tablet  0  . TEMAZEPAM PO Take 30 mg by mouth Nightly.         No current facility-administered medications for this visit.    Physical Exam: Filed Vitals:   07/17/13 1024  BP: 128/70  Pulse: 60  Height: 5\' 8"  (1.727 m)  Weight: 164 lb (74.39 kg)    GEN- The patient is well appearing, alert and oriented x 3 today.   Head- normocephalic, atraumatic Eyes-  Sclera clear, conjunctiva pink Ears- hearing intact Oropharynx- clear Lungs- Clear to ausculation bilaterally, normal work of breathing Chest- pacemaker pocket is well healed Heart- Regular rate and rhythm, no murmurs, rubs or gallops, PMI not laterally displaced GI- soft, NT, ND, + BS Extremities- no clubbing, cyanosis,  or edema  Pacemaker interrogation- reviewed in detail today,  See PACEART report  Assessment and Plan:  1. Bradycardia Normal pacemaker function Previously Atrial sensing (bipolar) was decreased, but normal unipolar Atrial sensing is therefore changed to unipolar See Pace Art report  2. Mode switches He has many atrial lead mode switches but longest episode is 1 minute.  Given unipolar atrial sensing, I am not convinced that this represents true arrhythmias.  Electrogram storage is off to preserve battery. If his atrial lead mode switches increase in duration then we may have to investigate further.  3. HTn Stable No change required today He reports frequent urination with lasix and would like to have this adjusted. Daily weights,  Decrease lasix to 20mg  qod.  If he is stable when he sees Dr Tenny Craw in the future then perhaps his lasix could be discontinued.

## 2013-07-21 ENCOUNTER — Other Ambulatory Visit: Payer: Self-pay | Admitting: Internal Medicine

## 2013-08-19 ENCOUNTER — Other Ambulatory Visit: Payer: Self-pay | Admitting: Internal Medicine

## 2013-10-13 ENCOUNTER — Other Ambulatory Visit: Payer: Self-pay | Admitting: Internal Medicine

## 2013-10-21 ENCOUNTER — Other Ambulatory Visit: Payer: Self-pay | Admitting: Internal Medicine

## 2013-10-24 ENCOUNTER — Ambulatory Visit (INDEPENDENT_AMBULATORY_CARE_PROVIDER_SITE_OTHER): Payer: Medicare Other | Admitting: Internal Medicine

## 2013-10-24 ENCOUNTER — Encounter: Payer: Self-pay | Admitting: Internal Medicine

## 2013-10-24 VITALS — BP 158/60 | HR 59 | Ht 68.0 in | Wt 164.0 lb

## 2013-10-24 DIAGNOSIS — I2510 Atherosclerotic heart disease of native coronary artery without angina pectoris: Secondary | ICD-10-CM | POA: Diagnosis not present

## 2013-10-24 MED ORDER — FUROSEMIDE 20 MG PO TABS: ORAL_TABLET | ORAL | Status: DC

## 2013-10-24 MED ORDER — POTASSIUM CHLORIDE ER 10 MEQ PO TBCR: 10.0000 meq | EXTENDED_RELEASE_TABLET | Freq: Two times a day (BID) | ORAL | Status: DC | PRN

## 2013-10-24 NOTE — Progress Notes (Addendum)
HPI Patinet is an 80 yo with a history of CADs/p rotation atherectomy and DES to the RCA in 10/09 (LHC at that time: prox to mid RCA 90-95% then 90% - tx with PCI; dRCA 40-60%, LAD 70-80% after D2, 40-50% at D1, oD2 60-70%, oCFX 40%, OM1 80%, AVCFX 30-40%). Last echo 10/09: EF 60-65%. Other hx includes bradycardia, s/p pacer, HTN, hyperlipidemia, seizure  D (s/p PCI to RCA 2009), HTN, HL  I saw him in cl;inc in Jan 2014.  He was seen by J Allred in [place] last fall.  Since seen in clinic he has done well from a cardiac standpoint.  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 Denies CP  Breathing is OK  He is taking lasix every other day  When takes he urinates a lot.  Wonders if he can stop. Allergies  Allergen Reactions  . Cyclosporine   . Prednisone Other (See Comments)    Can tolerate for a short period of time    Current Outpatient Prescriptions  Medication Sig Dispense Refill  . amLODipine (NORVASC) 5 MG tablet Take 0.5 tablets (2.5 mg total) by mouth daily.  15 tablet  1  . aspirin 81 MG tablet Take 81 mg by mouth daily.       . Calcium-Vitamin D (CALTRATE 600 PLUS-VIT D PO) Take by mouth 2 (two) times daily.        . clopidogrel (PLAVIX) 75 MG tablet TAKE ONE TABLET BY MOUTH ONCE DAILY  90 tablet  0  . finasteride (PROSCAR) 5 MG tablet Take 5 mg by mouth daily.        . furosemide (LASIX) 20 MG tablet Take by mouth every other day  45 tablet  3  . metoprolol tartrate (LOPRESSOR) 25 MG tablet TAKE ONE TABLET BY MOUTH TWICE DAILY  60 tablet  10  . metroNIDAZOLE (METROCREAM) 0.75 % cream Apply 1 application topically 2 (two) times daily as needed.        . Multiple Vitamins-Minerals (CENTRUM SILVER PO) Take by mouth daily.        . Multiple Vitamins-Minerals (OCUVITE ADULT 50+) CAPS Take 1 capsule by mouth daily.      Marland Kitchen NITROSTAT 0.4 MG SL tablet DISSOLVE ONE TABLET UNDER THE TONGUE EVERY 5 MINUTES AS NEEDED FOR CHEST PAIN.  DO NOT EXCEED A TOTAL OF 3 DOSES IN 15 MINUTES WITHOUT  SEEK  25 each   12  . OVER THE COUNTER MEDICATION as needed. Stool softner       . potassium chloride (K-DUR) 10 MEQ tablet Take 10 mEq by mouth 2 (two) times daily.      . simvastatin (ZOCOR) 40 MG tablet TAKE ONE-HALF TABLET BY MOUTH ONCE DAILY AT BEDTIME  30 tablet  3  . TEMAZEPAM PO Take 30 mg by mouth Nightly.         No current facility-administered medications for this visit.    Past Medical History  Diagnosis Date  . Symptomatic bradycardia     s/p pacemaker  . Coronary artery disease     PCI RCA 2009  . Hypertension   . Dyslipidemia   . Seizure     felt due to cyclosporin in setting of electrolyte disorder  . Scleritis, unspecified     treated with prednisone  . Solitary kidney     Past Surgical History  Procedure Laterality Date  . Pacemaker insertion      SJM    No family history on file.  History  Social History  . Marital Status: Married    Spouse Name: N/A    Number of Children: N/A  . Years of Education: N/A   Occupational History  . Not on file.   Social History Main Topics  . Smoking status: Never Smoker   . Smokeless tobacco: Not on file  . Alcohol Use: No  . Drug Use: No  . Sexual Activity: Not on file   Other Topics Concern  . Not on file   Social History Narrative   Married   Tobacco   etoh    Review of Systems:  All systems reviewed.  They are negative to the above problem except as previously stated.  Vital Signs: BP 150/70  P 60  Wt 158  Physical Exam Patient is in NAD HEENT:  Normocephalic, atraumatic. EOMI, PERRLA.  Neck: JVP is normal.  No bruits.  Lungs: clear to auscultation. No rales no wheezes.  Heart: Regular rate and rhythm. Normal S1, S2. No S3.   No significant murmurs. PMI not displaced.  Abdomen:  Supple, nontender. Normal bowel sounds. No masses. No hepatomegaly.  Extremities:   Good distal pulses throughout. No lower extremity edema.  Musculoskeletal :moving all extremities.  Neuro:   alert and oriented x3.  CN II-XII  grossly intact.  EKG  Atrial paced.  59 bpm.  Assessment and Plan:  1.  CAD  Doing well  No symptoms  Reasonable to stop lasix and K  I would check BMET in 10 days  Do not think he should need supplement.    2.  HTN  BP is a little high  He has "white coat" effect.  Follow  3.  HL  Contineu statin  Needs labs

## 2013-10-24 NOTE — Patient Instructions (Signed)
Your physician wants you to follow-up in: [place] will receive a reminder letter in the mail two months in advance. If you don't receive a letter, please call our office to schedule the follow-up appointment.   DECREASE FUROSEMIDE AND POTASSIUM TO AS NEEDED  Your physician recommends that you return for lab work in: 7-10 DAYS

## 2013-11-03 ENCOUNTER — Other Ambulatory Visit (INDEPENDENT_AMBULATORY_CARE_PROVIDER_SITE_OTHER): Payer: Medicare Other

## 2013-11-03 DIAGNOSIS — I2510 Atherosclerotic heart disease of native coronary artery without angina pectoris: Secondary | ICD-10-CM

## 2013-11-03 LAB — BASIC METABOLIC PANEL
BUN: 18 mg/dL (ref 6–23)
CO2: 29 mEq/L (ref 19–32)
Calcium: 9 mg/dL (ref 8.4–10.5)
Chloride: 101 mEq/L (ref 96–112)
Creatinine, Ser: 1.1 mg/dL (ref 0.4–1.5)
GFR: 70.48 mL/min (ref >60.00)
Glucose, Bld: 85 mg/dL (ref 70–99)
Potassium: 3.8 mEq/L (ref 3.5–5.1)
Sodium: 138 mEq/L (ref 135–145)

## 2013-12-25 DIAGNOSIS — I10 Essential (primary) hypertension: Secondary | ICD-10-CM | POA: Diagnosis not present

## 2013-12-25 DIAGNOSIS — N400 Enlarged prostate without lower urinary tract symptoms: Secondary | ICD-10-CM | POA: Diagnosis not present

## 2013-12-25 DIAGNOSIS — L719 Rosacea, unspecified: Secondary | ICD-10-CM | POA: Diagnosis not present

## 2013-12-25 DIAGNOSIS — E785 Hyperlipidemia, unspecified: Secondary | ICD-10-CM | POA: Diagnosis not present

## 2013-12-25 DIAGNOSIS — G479 Sleep disorder, unspecified: Secondary | ICD-10-CM | POA: Diagnosis not present

## 2014-01-01 ENCOUNTER — Other Ambulatory Visit: Payer: Self-pay | Admitting: Internal Medicine

## 2014-01-06 DIAGNOSIS — Z85828 Personal history of other malignant neoplasm of skin: Secondary | ICD-10-CM | POA: Diagnosis not present

## 2014-01-06 DIAGNOSIS — L821 Other seborrheic keratosis: Secondary | ICD-10-CM | POA: Diagnosis not present

## 2014-01-06 DIAGNOSIS — D1801 Hemangioma of skin and subcutaneous tissue: Secondary | ICD-10-CM | POA: Diagnosis not present

## 2014-01-06 DIAGNOSIS — L570 Actinic keratosis: Secondary | ICD-10-CM | POA: Diagnosis not present

## 2014-01-07 ENCOUNTER — Other Ambulatory Visit: Payer: Self-pay

## 2014-01-07 MED ORDER — NITROSTAT 0.4 MG SL SUBL: SUBLINGUAL_TABLET | SUBLINGUAL | Status: DC

## 2014-01-16 ENCOUNTER — Other Ambulatory Visit: Payer: Self-pay | Admitting: Internal Medicine

## 2014-01-23 ENCOUNTER — Encounter: Payer: Self-pay | Admitting: Internal Medicine

## 2014-01-23 DIAGNOSIS — Z950 Presence of cardiac pacemaker: Secondary | ICD-10-CM | POA: Diagnosis not present

## 2014-01-23 DIAGNOSIS — I498 Other specified cardiac arrhythmias: Secondary | ICD-10-CM

## 2014-01-23 DIAGNOSIS — R55 Syncope and collapse: Secondary | ICD-10-CM | POA: Diagnosis not present

## 2014-04-18 ENCOUNTER — Other Ambulatory Visit: Payer: Self-pay | Admitting: Internal Medicine

## 2014-04-24 DIAGNOSIS — I498 Other specified cardiac arrhythmias: Secondary | ICD-10-CM | POA: Diagnosis not present

## 2014-04-24 DIAGNOSIS — R55 Syncope and collapse: Secondary | ICD-10-CM | POA: Diagnosis not present

## 2014-04-24 DIAGNOSIS — Z950 Presence of cardiac pacemaker: Secondary | ICD-10-CM | POA: Diagnosis not present

## 2014-04-30 ENCOUNTER — Other Ambulatory Visit: Payer: Self-pay | Admitting: Internal Medicine

## 2014-05-05 ENCOUNTER — Telehealth: Payer: Self-pay | Admitting: Internal Medicine

## 2014-05-05 ENCOUNTER — Other Ambulatory Visit: Payer: Self-pay

## 2014-05-05 ENCOUNTER — Telehealth: Payer: Self-pay

## 2014-05-05 MED ORDER — AMLODIPINE BESYLATE 5 MG PO TABS: ORAL_TABLET | ORAL | Status: DC

## 2014-05-05 NOTE — Telephone Encounter (Signed)
OK to fill as 1 tablet per day.

## 2014-05-05 NOTE — Telephone Encounter (Signed)
error 

## 2014-05-06 DIAGNOSIS — Z23 Encounter for immunization: Secondary | ICD-10-CM | POA: Diagnosis not present

## 2014-05-08 ENCOUNTER — Other Ambulatory Visit: Payer: Self-pay | Admitting: Internal Medicine

## 2014-05-14 ENCOUNTER — Encounter: Payer: Self-pay | Admitting: Internal Medicine

## 2014-05-16 ENCOUNTER — Other Ambulatory Visit: Payer: Self-pay | Admitting: Internal Medicine

## 2014-07-17 ENCOUNTER — Ambulatory Visit (INDEPENDENT_AMBULATORY_CARE_PROVIDER_SITE_OTHER): Payer: Medicare Other | Admitting: Internal Medicine

## 2014-07-17 ENCOUNTER — Encounter: Payer: Self-pay | Admitting: Internal Medicine

## 2014-07-17 VITALS — BP 148/70 | HR 63 | Ht 70.5 in | Wt 167.0 lb

## 2014-07-17 DIAGNOSIS — I2510 Atherosclerotic heart disease of native coronary artery without angina pectoris: Secondary | ICD-10-CM

## 2014-07-17 DIAGNOSIS — I495 Sick sinus syndrome: Secondary | ICD-10-CM | POA: Diagnosis not present

## 2014-07-17 DIAGNOSIS — Z950 Presence of cardiac pacemaker: Secondary | ICD-10-CM | POA: Diagnosis not present

## 2014-07-17 DIAGNOSIS — I10 Essential (primary) hypertension: Secondary | ICD-10-CM

## 2014-07-17 LAB — MDC_IDC_ENUM_SESS_TYPE_INCLINIC
Battery Impedance: 1000 Ohm — CL
Battery Voltage: 2.79 V
Date Time Interrogation Session: 20151030132108
Implantable Pulse Generator Model: 5826
Implantable Pulse Generator Serial Number: 2231585
Lead Channel Impedance Value: 386 Ohm
Lead Channel Impedance Value: 436 Ohm
Lead Channel Pacing Threshold Amplitude: 0.75 V
Lead Channel Pacing Threshold Amplitude: 1 V
Lead Channel Pacing Threshold Pulse Width: 0.5 ms
Lead Channel Pacing Threshold Pulse Width: 0.5 ms
Lead Channel Sensing Intrinsic Amplitude: 1.9 mV
Lead Channel Sensing Intrinsic Amplitude: 12 mV
Lead Channel Setting Pacing Amplitude: 2 V
Lead Channel Setting Pacing Amplitude: 2.5 V
Lead Channel Setting Pacing Pulse Width: 0.5 ms
Lead Channel Setting Sensing Sensitivity: 2 mV

## 2014-07-17 NOTE — Patient Instructions (Addendum)
Your physician recommends that you schedule a follow-up appointment in:4 weeks with device clinic [name]) and 12 months with Dr Rayann Heman

## 2014-07-18 NOTE — Progress Notes (Signed)
PCP: Shirline Frees, MD Primary Cardiologist:  Dr Elease Etienne is a 80 y.o. male who presents today for routine electrophysiology followup.  Since last being seen in our clinic, the patient reports doing very well.  Today, he denies symptoms of palpitations, chest pain, shortness of breath,  lower extremity edema, dizziness, presyncope, or syncope.  The patient is otherwise without complaint today.   Past Medical History  Diagnosis Date  . Symptomatic bradycardia     s/p pacemaker  . Coronary artery disease     PCI RCA 2009  . Hypertension   . Dyslipidemia   . Seizure     felt due to cyclosporin in setting of electrolyte disorder  . Scleritis, unspecified     treated with prednisone  . Solitary kidney    Past Surgical History  Procedure Laterality Date  . Pacemaker insertion      SJM    Current Outpatient Prescriptions  Medication Sig Dispense Refill  . amLODipine (NORVASC) 5 MG tablet TAKE 1 TABLET BY MOUTH DAILY      . aspirin 81 MG tablet Take 81 mg by mouth daily.       . Cholecalciferol (VITAMIN D-3) 1000 UNITS CAPS Take 1 capsule by mouth daily.      . clopidogrel (PLAVIX) 75 MG tablet TAKE ONE TABLET BY MOUTH ONCE DAILY  90 tablet  0  . finasteride (PROSCAR) 5 MG tablet Take 5 mg by mouth daily.        . metoprolol tartrate (LOPRESSOR) 25 MG tablet TAKE ONE TABLET BY MOUTH TWICE DAILY  60 tablet  3  . metroNIDAZOLE (METROCREAM) 0.75 % cream Apply 1 application topically 2 (two) times daily as needed.        . Multiple Vitamins-Minerals (CENTRUM SILVER PO) Take 1 capsule by mouth daily.       . Multiple Vitamins-Minerals (OCUVITE ADULT 50+) CAPS Take 1 capsule by mouth daily.      Marland Kitchen NITROSTAT 0.4 MG SL tablet DISSOLVE ONE TABLET UNDER THE TONGUE EVERY 5 MINUTES AS NEEDED FOR CHEST PAIN.  DO NOT EXCEED A TOTAL OF 3 DOSES IN 15 MINUTES WITHOUT  SEEK  25 tablet  3  . OVER THE COUNTER MEDICATION Take 1 capsule by mouth as needed. Stool softner      . Simethicone (GAS-X  PO) Take 1 capsule by mouth daily as needed (GAS).      Marland Kitchen simvastatin (ZOCOR) 40 MG tablet TAKE ONE-HALF TABLET BY MOUTH AT BEDTIME  30 tablet  10  . TEMAZEPAM PO Take 30 mg by mouth Nightly.        . Calcium-Vitamin D (CALTRATE 600 PLUS-VIT D PO) Take 1 capsule by mouth 2 (two) times daily.        No current facility-administered medications for this visit.    Physical Exam: Filed Vitals:   07/17/14 1234  BP: 148/70  Pulse: 63  Height: 5' 10.5" (1.791 m)  Weight: 167 lb (75.751 kg)    GEN- The patient is well appearing, alert and oriented x 3 today.   Head- normocephalic, atraumatic Eyes-  Sclera clear, conjunctiva pink Ears- hearing intact Oropharynx- clear Lungs- Clear to ausculation bilaterally, normal work of breathing Chest- pacemaker pocket is well healed Heart- Regular rate and rhythm, no murmurs, rubs or gallops, PMI not laterally displaced GI- soft, NT, ND, + BS Extremities- no clubbing, cyanosis, or edema  Pacemaker interrogation- reviewed in detail today,  See PACEART report  Assessment and Plan:  1.  Sinus bradycardia/ sick sinus syndrome Normal pacemaker function Previously Atrial sensing (bipolar) was decreased, but normal unipolar Atrial sensing is therefore changed to unipolar See Pace Art report  2. Mode switches He has many atrial lead mode switches but longest episode is 1 minute.  Given unipolar atrial sensing, I am not convinced that this represents true arrhythmias.  Today, with arm movement, atrial lead artifact is observed.  I think that artifact is almost certainly the cause for the mode switches.  To better evaluate this, I have turned electrogram storage on today.  The patient will return in 4 weeks to see Terrence Dupont (in the device clinic) to look at the mode switch electrograms and hopefully exclude AF.  We will then turn electrogram storage back off at that time to preserve battery.  3. HTn Stable No change required today  Return to the device  clinic to see Terrence Dupont in 4 weeks Follow-up with EP device NP in 6 months I will see in a year

## 2014-08-17 ENCOUNTER — Ambulatory Visit (INDEPENDENT_AMBULATORY_CARE_PROVIDER_SITE_OTHER): Payer: Medicare Other | Admitting: *Deleted

## 2014-08-17 DIAGNOSIS — I495 Sick sinus syndrome: Secondary | ICD-10-CM | POA: Diagnosis not present

## 2014-08-17 LAB — MDC_IDC_ENUM_SESS_TYPE_INCLINIC
Battery Impedance: 1000 Ohm — CL
Battery Voltage: 2.79 V
Date Time Interrogation Session: 20151130113917
Implantable Pulse Generator Model: 5826
Implantable Pulse Generator Serial Number: 2231585
Lead Channel Impedance Value: 387 Ohm
Lead Channel Impedance Value: 427 Ohm
Lead Channel Pacing Threshold Amplitude: 0.75 V
Lead Channel Pacing Threshold Amplitude: 1 V
Lead Channel Pacing Threshold Pulse Width: 0.5 ms
Lead Channel Pacing Threshold Pulse Width: 0.5 ms
Lead Channel Setting Pacing Amplitude: 2 V
Lead Channel Setting Pacing Amplitude: 2.5 V
Lead Channel Setting Pacing Pulse Width: 0.5 ms
Lead Channel Setting Sensing Sensitivity: 2 mV

## 2014-08-17 NOTE — Progress Notes (Signed)
Pt to device clinic for EGM storage review. EGM storage turned off r/t noise.

## 2014-08-28 ENCOUNTER — Encounter: Payer: Self-pay | Admitting: Internal Medicine

## 2014-09-14 ENCOUNTER — Other Ambulatory Visit: Payer: Self-pay | Admitting: Internal Medicine

## 2014-09-15 NOTE — Telephone Encounter (Signed)
Fay Records, MD at 10/24/2013 11:49 AM  metoprolol tartrate (LOPRESSOR) 25 MG tablet  TAKE ONE TABLET BY MOUTH TWICE DAILY Patient Instructions:   Your physician wants you to follow-up in: [place] will receive a reminder letter in the mail two months in advance. If you don't receive a letter, please call our office to schedule the follow-up appointment.  DECREASE FUROSEMIDE AND POTASSIUM TO AS NEEDED Your physician recommends that you return for lab work in: 7-10 DAYS

## 2014-09-30 ENCOUNTER — Other Ambulatory Visit: Payer: Self-pay | Admitting: Internal Medicine

## 2014-10-02 DIAGNOSIS — Z85828 Personal history of other malignant neoplasm of skin: Secondary | ICD-10-CM | POA: Diagnosis not present

## 2014-10-02 DIAGNOSIS — L821 Other seborrheic keratosis: Secondary | ICD-10-CM | POA: Diagnosis not present

## 2014-10-30 ENCOUNTER — Ambulatory Visit: Payer: Medicare Other | Admitting: Internal Medicine

## 2014-11-06 ENCOUNTER — Other Ambulatory Visit: Payer: Self-pay | Admitting: *Deleted

## 2014-11-06 MED ORDER — AMLODIPINE BESYLATE 5 MG PO TABS: 5.0000 mg | ORAL_TABLET | Freq: Every day | ORAL | Status: DC

## 2014-11-06 MED ORDER — METOPROLOL TARTRATE 25 MG PO TABS: 25.0000 mg | ORAL_TABLET | Freq: Two times a day (BID) | ORAL | Status: DC

## 2014-12-03 ENCOUNTER — Encounter: Payer: Self-pay | Admitting: Internal Medicine

## 2014-12-03 ENCOUNTER — Ambulatory Visit (INDEPENDENT_AMBULATORY_CARE_PROVIDER_SITE_OTHER): Payer: Medicare Other | Admitting: Internal Medicine

## 2014-12-03 VITALS — BP 171/73 | HR 71 | Ht 70.0 in | Wt 172.0 lb

## 2014-12-03 DIAGNOSIS — E785 Hyperlipidemia, unspecified: Secondary | ICD-10-CM | POA: Diagnosis not present

## 2014-12-03 DIAGNOSIS — I10 Essential (primary) hypertension: Secondary | ICD-10-CM | POA: Diagnosis not present

## 2014-12-03 MED ORDER — TRIAMTERENE-HCTZ 37.5-25 MG PO TABS: 0.5000 | ORAL_TABLET | Freq: Every day | ORAL | Status: DC

## 2014-12-03 NOTE — Progress Notes (Signed)
Cardiology Office Note   Date:  12/03/2014   ID:  [name], DOB Mar 17, 1931, MRN [id number]  PCP:  Shirline Frees, MD  Cardiologist:   Dorris Carnes, MD   Chief Complaint  Patient presents with  . Appointment      History of Present Illness: [name] is a 81 y.o. male with a history of CADs/p rotation atherectomy and DES to the RCA in 10/09 (LHC at that time: prox to mid RCA 90-95% then 90% - tx with PCI; dRCA 40-60%, LAD 70-80% after D2, 40-50% at D1, oD2 60-70%, oCFX 40%, OM1 80%, AVCFX 30-40%). Last echo 10/09: EF 60-65%. Other hx includes bradycardia, s/p pacer, HTN, hyperlipidemia, seizure  D (s/p PCI to RCA 2009), HTN, HL I saw him in cl;inc in Jan 2014. He was seen by J Allred in [place] last fall.  Just got back from [place]  Was walking 1 hour per day   BP was better in [place] Back home in [place] increased stress  BP 140/    Current Outpatient Prescriptions  Medication Sig Dispense Refill  . amLODipine (NORVASC) 5 MG tablet Take 1 tablet (5 mg total) by mouth daily. 30 tablet 0  . aspirin 81 MG tablet Take 81 mg by mouth daily.     . Cholecalciferol (VITAMIN D-3) 1000 UNITS CAPS Take 1 capsule by mouth daily.    . clopidogrel (PLAVIX) 75 MG tablet TAKE ONE TABLET BY MOUTH ONCE DAILY 90 tablet 0  . clopidogrel (PLAVIX) 75 MG tablet TAKE ONE TABLET BY MOUTH ONCE DAILY 90 tablet 0  . finasteride (PROSCAR) 5 MG tablet Take 5 mg by mouth daily.      . metoprolol tartrate (LOPRESSOR) 25 MG tablet Take 1 tablet (25 mg total) by mouth 2 (two) times daily. 60 tablet 0  . metroNIDAZOLE (METROCREAM) 0.75 % cream Apply 1 application topically 2 (two) times daily as needed.      . Multiple Vitamins-Minerals (CENTRUM SILVER PO) Take 1 capsule by mouth daily.     . Multiple Vitamins-Minerals (OCUVITE ADULT 50+) CAPS Take 1 capsule by mouth daily.    Marland Kitchen OVER THE COUNTER MEDICATION Take 1 capsule by mouth as needed. Stool softner    . Simethicone (GAS-X PO) Take 1 capsule by mouth  daily as needed (GAS).    Marland Kitchen simvastatin (ZOCOR) 40 MG tablet TAKE ONE-HALF TABLET BY MOUTH AT BEDTIME 30 tablet 10  . TEMAZEPAM PO Take 30 mg by mouth Nightly.      Marland Kitchen NITROSTAT 0.4 MG SL tablet DISSOLVE ONE TABLET UNDER THE TONGUE EVERY 5 MINUTES AS NEEDED FOR CHEST PAIN.  DO NOT EXCEED A TOTAL OF 3 DOSES IN 15 MINUTES WITHOUT  SEEK (Patient not taking: Reported on 12/03/2014) 25 tablet 3   No current facility-administered medications for this visit.    Allergies:   Cyclosporine and Prednisone   Past Medical History  Diagnosis Date  . Symptomatic bradycardia     s/p pacemaker  . Coronary artery disease     PCI RCA 2009  . Hypertension   . Dyslipidemia   . Seizure     felt due to cyclosporin in setting of electrolyte disorder  . Scleritis, unspecified     treated with prednisone  . Solitary kidney     Past Surgical History  Procedure Laterality Date  . Pacemaker insertion      SJM     Social History:  The patient  reports that he has never smoked.  He does not have any smokeless tobacco history on file. He reports that he does not drink alcohol or use illicit drugs.   Family History:  The patient's family history is not on file.    ROS:  Please see the history of present illness. All other systems are reviewed and  Negative to the above problem except as noted.    PHYSICAL EXAM: VS:  BP 171/73 mmHg  Pulse 71  Ht 5\' 10"  (1.778 m)  Wt 172 lb (78.019 kg)  BMI 24.68 kg/m2  GEN: Well nourished, well developed, in no acute distress HEENT: normal Neck: no JVD, carotid bruits, or masses Cardiac: RRR; no murmurs, rubs, or gallops,Tr edema  Respiratory:  clear to auscultation bilaterally, normal work of breathing GI: soft, nontender, nondistended, + BS  No hepatomegaly  MS: no deformity Moving all extremities   Skin: warm and dry, no rash Neuro:  Strength and sensation are intact Psych: euthymic mood, full affect   EKG:  EKG is not ordered today.   Lipid Panel      Component Value Date/Time   CHOL 135 06/29/2009 1230   TRIG 37.0 06/29/2009 1230   HDL 63.70 06/29/2009 1230   CHOLHDL 2 06/29/2009 1230   VLDL 7.4 06/29/2009 1230   LDLCALC 64 06/29/2009 1230      Wt Readings from Last 3 Encounters:  12/03/14 172 lb (78.019 kg)  07/17/14 167 lb (75.751 kg)  10/24/13 164 lb (74.39 kg)      ASSESSMENT AND PLAN: 1.  CAD  Doing well  No symptoms of angina    2  HTN   BP is increased  I would recomm maxzide 1/2 of 37.5/25  F/U in clinic in a few wks  WIll check labs then  3.  HL  Review shows not on statin.  / why  WIll need t oevaluate        Current medicines are reviewed at length with the patient today.  The patient does not have concerns regarding medicines.  The following changes have been made:   Labs/ tests ordered today include: No orders of the defined types were placed in this encounter.     Disposition:   FU with  in   Signed, Dorris Carnes, MD  12/03/2014 [facility] [address], [place], [place]  [address] Phone: [phone]; Fax: [phone]

## 2014-12-03 NOTE — Patient Instructions (Addendum)
Your physician has recommended you make the following change in your medication:  1.) start Maxzide 37.5/25 mg --take 1/2 tablet daily  Your physician recommends that you schedule a follow-up appointment in: 3-4 weeks for blood pressure check with the Nurse, on a day Dr. Harrington Challenger is in clinic.

## 2014-12-04 ENCOUNTER — Other Ambulatory Visit: Payer: Self-pay | Admitting: Internal Medicine

## 2014-12-07 DIAGNOSIS — L719 Rosacea, unspecified: Secondary | ICD-10-CM | POA: Diagnosis not present

## 2014-12-07 DIAGNOSIS — I10 Essential (primary) hypertension: Secondary | ICD-10-CM | POA: Diagnosis not present

## 2014-12-07 DIAGNOSIS — N400 Enlarged prostate without lower urinary tract symptoms: Secondary | ICD-10-CM | POA: Diagnosis not present

## 2014-12-07 DIAGNOSIS — E780 Pure hypercholesterolemia: Secondary | ICD-10-CM | POA: Diagnosis not present

## 2014-12-07 DIAGNOSIS — G4700 Insomnia, unspecified: Secondary | ICD-10-CM | POA: Diagnosis not present

## 2014-12-07 DIAGNOSIS — Z86010 Personal history of colonic polyps: Secondary | ICD-10-CM | POA: Diagnosis not present

## 2014-12-25 ENCOUNTER — Other Ambulatory Visit: Payer: Self-pay | Admitting: Internal Medicine

## 2014-12-28 ENCOUNTER — Encounter: Payer: Self-pay | Admitting: Internal Medicine

## 2014-12-28 ENCOUNTER — Ambulatory Visit (INDEPENDENT_AMBULATORY_CARE_PROVIDER_SITE_OTHER): Payer: Medicare Other | Admitting: *Deleted

## 2014-12-28 ENCOUNTER — Encounter: Payer: Self-pay | Admitting: *Deleted

## 2014-12-28 VITALS — BP 152/82 | HR 60 | Wt 170.0 lb

## 2014-12-28 DIAGNOSIS — I10 Essential (primary) hypertension: Secondary | ICD-10-CM | POA: Diagnosis not present

## 2014-12-28 NOTE — Patient Instructions (Signed)
Continue on same medication- Maxzide 37.5/25 mg daily  Notify office if have any swelling in legs.  Bring blood pressure cuff to next office visit with Dr. Harrington Challenger.

## 2014-12-28 NOTE — Progress Notes (Signed)
1.) Reason for visit: BP check since starting Maxzide 37.5/25 mg  2.) Name of MD requesting visit:  Dr. Dorris Carnes  3.) H&P: Hx of Hypertension  4.) ROS related to problem: No c/o today.  States he has felt fine since starting the Maxzide.  States the edema in ankles "went down after a couple of days". No edema noted today. States he worked out about 30 min this AM.  His BP at home this AM was 140/68 HR 62. He also              Brought his labs from Dr. Kenton Kingfisher office.  Will have scanned for chart.              BP at home 3/21 130/67 HR 62          He did not bring his BP cuff today             3/22  141/69 HR 60             3/23  129/71 HR 74             3/24  136/69 HR 59             3/25  133/66 HR 59             3/28 140/79 HR 60             3/31 120/66 HR 60              4/5  128/67 HR 62              4/9  125/64 HR 60           Reviewed with Dr. Harrington Challenger.  She advises for him to stay on same medication and to bring his BP cuff at next visit. She reviewed his lab and will           Send to MR to be scanned. Will forward to Dr. Harrington Challenger for review. Also advised to notify office if has any edema in ankles.

## 2015-01-04 ENCOUNTER — Ambulatory Visit (INDEPENDENT_AMBULATORY_CARE_PROVIDER_SITE_OTHER): Payer: Medicare Other | Admitting: Nurse Practitioner

## 2015-01-04 ENCOUNTER — Encounter: Payer: Self-pay | Admitting: Nurse Practitioner

## 2015-01-04 VITALS — BP 130/74 | HR 60 | Ht 70.5 in | Wt 170.8 lb

## 2015-01-04 DIAGNOSIS — I10 Essential (primary) hypertension: Secondary | ICD-10-CM | POA: Diagnosis not present

## 2015-01-04 DIAGNOSIS — I495 Sick sinus syndrome: Secondary | ICD-10-CM | POA: Diagnosis not present

## 2015-01-04 LAB — BASIC METABOLIC PANEL
BUN: 27 mg/dL — ABNORMAL HIGH (ref 6–23)
CO2: 30 mEq/L (ref 19–32)
Calcium: 9.7 mg/dL (ref 8.4–10.5)
Chloride: 96 mEq/L (ref 96–112)
Creatinine, Ser: 1.1 mg/dL (ref 0.40–1.50)
GFR: 68.07 mL/min (ref >60.00)
Glucose, Bld: 83 mg/dL (ref 70–99)
Potassium: 4.1 mEq/L (ref 3.5–5.1)
Sodium: 132 mEq/L — ABNORMAL LOW (ref 135–145)

## 2015-01-04 NOTE — Progress Notes (Signed)
Electrophysiology Office Note Date: 01/04/2015  ID:  [name], DOB Nov 18, 1930, MRN [id number]  PCP: Shirline Frees, MD Primary Cardiologist: Harrington Challenger Electrophysiologist: Allred  CC: Pacemaker follow-up  [name] is a 81 y.o. male is seen today for Dr Rayann Heman.  He presents today for routine electrophysiology followup.  Since last being seen in our clinic, the patient reports doing very well.  He denies chest pain, palpitations, dyspnea, PND, orthopnea, nausea, vomiting, dizziness, syncope.  He remains very active and recently spent 6 weeks in [facility]. He walks an hour and a half every day without functional limitations.   Device History: STJ dual chamber PPM implanted 2009 by Dr Olevia Perches for symptomatic bradycardia   Past Medical History  Diagnosis Date  . Symptomatic bradycardia     a. s/p STJ dual chamber pacemaker  . Coronary artery disease     PCI RCA 2009  . Hypertension   . Dyslipidemia   . Seizure     felt due to cyclosporin in setting of electrolyte disorder  . Scleritis, unspecified     treated with prednisone  . Solitary kidney    Past Surgical History  Procedure Laterality Date  . Pacemaker insertion  2009    STJ dual chamber pacemaker implanted by Dr Olevia Perches for symptomatic bradycardia    Current Outpatient Prescriptions  Medication Sig Dispense Refill  . aspirin 81 MG tablet Take 81 mg by mouth daily.     . Cholecalciferol (VITAMIN D-3) 1000 UNITS CAPS Take 1 capsule by mouth daily.    . finasteride (PROSCAR) 5 MG tablet Take 5 mg by mouth daily.      . metroNIDAZOLE (METROCREAM) 0.75 % cream Apply 1 application topically 2 (two) times daily as needed.      . Multiple Vitamins-Minerals (CENTRUM SILVER PO) Take 1 capsule by mouth daily.     . Multiple Vitamins-Minerals (OCUVITE ADULT 50+) CAPS Take 1 capsule by mouth daily.    [name] Kitchen NITROSTAT 0.4 MG SL tablet DISSOLVE ONE TABLET UNDER THE TONGUE EVERY 5 MINUTES AS NEEDED FOR CHEST PAIN.  DO NOT EXCEED  A TOTAL OF 3 DOSES IN 15 MINUTES WITHOUT  SEEK 25 tablet 3  . OVER THE COUNTER MEDICATION Take 1 capsule by mouth as needed. Stool softner    . Simethicone (GAS-X PO) Take 1 capsule by mouth daily as needed (GAS).    [name] Kitchen simvastatin (ZOCOR) 40 MG tablet TAKE ONE-HALF TABLET BY MOUTH AT BEDTIME 30 tablet 10  . TEMAZEPAM PO Take 30 mg by mouth Nightly.      [name] Kitchen amLODipine (NORVASC) 5 MG tablet TAKE ONE TABLET BY MOUTH ONCE DAILY 30 tablet 5  . clopidogrel (PLAVIX) 75 MG tablet TAKE ONE TABLET BY MOUTH ONCE DAILY 90 tablet 2  . metoprolol tartrate (LOPRESSOR) 25 MG tablet Take 1 tablet (25 mg total) by mouth 2 (two) times daily. 60 tablet 0  . triamterene-hydrochlorothiazide (MAXZIDE-25) 37.5-25 MG per tablet Take 0.5 tablets by mouth daily. 15 tablet 12   No current facility-administered medications for this visit.    Allergies:   Cyclosporine and Prednisone   Social History: History   Social History  . Marital Status: Widowed    Spouse Name: N/A  . Number of Children: 2  . Years of Education: college   Occupational History  . retired    Social History Main Topics  . Smoking status: Never Smoker   . Smokeless tobacco: Not on file  . Alcohol Use: No  .  Drug Use: No  . Sexual Activity: Not on file   Other Topics Concern  . Not on file   Social History Narrative   Married   Tobacco   etoh    Family History: Family History  Problem Relation Age of Onset  . Arthritis/Rheumatoid Mother     Review of Systems: All other systems reviewed and are otherwise negative except as noted above.  Physical Exam: VS:  BP 130/74 mmHg  Pulse 60  Ht 5' 10.5" (1.791 m)  Wt 170 lb 12.8 oz (77.474 kg)  BMI 24.15 kg/m2 , BMI Body mass index is 24.15 kg/(m^2).  GEN- The patient is elderly, well appearing, alert and oriented x 3 today.   HEENT: normocephalic, atraumatic; sclera clear, conjunctiva pink; hearing intact; oropharynx clear; neck supple  Lungs- Clear to ausculation bilaterally,  normal work of breathing.  No wheezes, rales, rhonchi Heart- Regular rate and rhythm, no murmurs, rubs or gallops  GI- soft, non-tender, non-distended, bowel sounds present  Extremities- no clubbing, cyanosis, or edema  MS- no significant deformity or atrophy Skin- warm and dry, no rash or lesion; PPM pocket well healed Psych- euthymic mood, full affect Neuro- strength and sensation are intact  PPM Interrogation- reviewed in detail today,  See PACEART report  EKG:  EKG is ordered today. The ekg ordered today shows atrial pacing, rate 60, poor R wave progression, normal intervals  Recent Labs: No results found for requested labs within last 365 days.   Wt Readings from Last 3 Encounters:  01/04/15 170 lb 12.8 oz (77.474 kg)  12/28/14 170 lb (77.111 kg)  12/03/14 172 lb (78.019 kg)     Assessment and Plan:  1.  Symptomatic bradycardia Normal PPM function See Pace Art report No changes today Atrial sensing programmed unipolar due to decreased P waves bipolar Pt atrially paces 48% of the time but has no functional limitations programmed DDD.  Advised today if he develops exertional symptoms, rate response can be turned on.   2.  Mode switches Previously demonstrated to be noise on atrial lead - continues with frequent but short mode switch episodes All <3 minutes No changes made today.  EGM storage left off.  If episodes lengthen in duration, may need to re-evaluate EGMs  3.  CAD  No recent ischemic symptoms Continue ASA/Plavix  4.  HTN Maxzide added in March at visit with Dr Harrington Challenger Pt reports home blood pressures have been systolic 357'S BMET today   Current medicines are reviewed at length with the patient today.   The patient does not have concerns regarding his medicines.  The following changes were made today:  none  Labs/ tests ordered today include:  Orders Placed This Encounter  Procedures  . Basic Metabolic Panel (BMET)  . EKG 12-Lead    Disposition:    Follow up with Dr Rayann Heman 6 months  Signed, Chanetta Marshall, NP 01/04/2015 10:33 AM  [address] [place] [id number] [phone] (office) [phone] (fax)

## 2015-01-04 NOTE — Patient Instructions (Signed)
**Note De-Identified  Obfuscation** Medication Instructions:  Continue same  Labwork: Today (BMET)  Testing/Procedures: None  Follow-Up: Your physician wants you to follow-up in: 6 months. You will receive a reminder letter in the mail two months in advance. If you don't receive a letter, please call our office to schedule the follow-up appointment.

## 2015-01-08 ENCOUNTER — Other Ambulatory Visit: Payer: Self-pay | Admitting: *Deleted

## 2015-01-08 DIAGNOSIS — I10 Essential (primary) hypertension: Secondary | ICD-10-CM

## 2015-01-14 ENCOUNTER — Other Ambulatory Visit (INDEPENDENT_AMBULATORY_CARE_PROVIDER_SITE_OTHER): Payer: Medicare Other | Admitting: *Deleted

## 2015-01-14 DIAGNOSIS — I10 Essential (primary) hypertension: Secondary | ICD-10-CM

## 2015-01-14 LAB — BASIC METABOLIC PANEL
BUN: 27 mg/dL — ABNORMAL HIGH (ref 6–23)
CO2: 33 mEq/L — ABNORMAL HIGH (ref 19–32)
Calcium: 9.6 mg/dL (ref 8.4–10.5)
Chloride: 97 mEq/L (ref 96–112)
Creatinine, Ser: 1.09 mg/dL (ref 0.40–1.50)
GFR: 68.78 mL/min (ref >60.00)
Glucose, Bld: 77 mg/dL (ref 70–99)
Potassium: 3.8 mEq/L (ref 3.5–5.1)
Sodium: 133 mEq/L — ABNORMAL LOW (ref 135–145)

## 2015-01-14 NOTE — Addendum Note (Signed)
Addended by: Eulis Foster on: 01/14/2015 09:37 AM   Modules accepted: Orders

## 2015-01-15 ENCOUNTER — Encounter: Payer: Self-pay | Admitting: Internal Medicine

## 2015-01-18 DIAGNOSIS — D1801 Hemangioma of skin and subcutaneous tissue: Secondary | ICD-10-CM | POA: Diagnosis not present

## 2015-01-18 DIAGNOSIS — D692 Other nonthrombocytopenic purpura: Secondary | ICD-10-CM | POA: Diagnosis not present

## 2015-01-18 DIAGNOSIS — D225 Melanocytic nevi of trunk: Secondary | ICD-10-CM | POA: Diagnosis not present

## 2015-01-18 DIAGNOSIS — D2272 Melanocytic nevi of left lower limb, including hip: Secondary | ICD-10-CM | POA: Diagnosis not present

## 2015-01-18 DIAGNOSIS — L821 Other seborrheic keratosis: Secondary | ICD-10-CM | POA: Diagnosis not present

## 2015-01-18 DIAGNOSIS — Z85828 Personal history of other malignant neoplasm of skin: Secondary | ICD-10-CM | POA: Diagnosis not present

## 2015-01-18 DIAGNOSIS — L570 Actinic keratosis: Secondary | ICD-10-CM | POA: Diagnosis not present

## 2015-01-20 ENCOUNTER — Encounter: Payer: Self-pay | Admitting: Internal Medicine

## 2015-02-17 ENCOUNTER — Telehealth: Payer: Self-pay | Admitting: Internal Medicine

## 2015-02-17 NOTE — Telephone Encounter (Signed)
Left message to call back. Maxzide removed from med list and added to allergy list

## 2015-02-17 NOTE — Telephone Encounter (Signed)
Have him stop Maxzide   Take BP at home with new cuff to confirm BP over next 2 wks Call back with results   Put maxzide on possible allergy list (adverse Rxn)

## 2015-02-17 NOTE — Telephone Encounter (Signed)
Pt c/o medication issue:  1. Name of Medication: triamterene-hydrochlorothiazide   2. How are you currently taking this medication (dosage and times per day)?  3. Are you having a reaction (difficulty breathing--STAT)? Causes problems every time he takes the medication. Went off of it for 3 days and it makes him feel better. He states that when he takes the medication he feels bloated.   4. What is your medication issue?

## 2015-02-17 NOTE — Telephone Encounter (Signed)
Spoke with pt. He reports gas pain and bloating after every meal. This began after starting triamterene-hctz.  He thought at first it was related to his diet so he made several dietary changes with no improvement. He typically takes medication at 8 AM after eating breakfast. He stopped triamterene-hctz for 3 days and did not have any pain or bloating. He resumed medication today and had pain and bloating after eating.  He purchased new blood pressure machine yesterday and today BP is 130/70 and pulse 60.  Has no readings while off medication because his machine was not working. He is asking about changing to different blood pressure medication. Will forward to Dr. Harrington Challenger for recommendations.

## 2015-02-18 NOTE — Telephone Encounter (Signed)
Spoke with pt and informed him to stop Maxzide and take BP at home for 2 weeks and call with results. Pt verbalized understanding and was in agreement with this plan.

## 2015-02-22 DIAGNOSIS — Z961 Presence of intraocular lens: Secondary | ICD-10-CM | POA: Diagnosis not present

## 2015-02-22 DIAGNOSIS — H179 Unspecified corneal scar and opacity: Secondary | ICD-10-CM | POA: Diagnosis not present

## 2015-02-22 DIAGNOSIS — H1131 Conjunctival hemorrhage, right eye: Secondary | ICD-10-CM | POA: Diagnosis not present

## 2015-02-22 DIAGNOSIS — I10 Essential (primary) hypertension: Secondary | ICD-10-CM | POA: Diagnosis not present

## 2015-02-22 DIAGNOSIS — Z09 Encounter for follow-up examination after completed treatment for conditions other than malignant neoplasm: Secondary | ICD-10-CM | POA: Diagnosis not present

## 2015-03-08 DIAGNOSIS — I495 Sick sinus syndrome: Secondary | ICD-10-CM | POA: Diagnosis not present

## 2015-03-08 DIAGNOSIS — Z950 Presence of cardiac pacemaker: Secondary | ICD-10-CM | POA: Diagnosis not present

## 2015-03-10 ENCOUNTER — Telehealth: Payer: Self-pay | Admitting: Internal Medicine

## 2015-03-10 NOTE — Telephone Encounter (Signed)
New message      Pt c/o BP issue: STAT if pt c/o blurred vision, one-sided weakness or slurred speech  1. What are your last 5 BP readings? 6-3  116/67; 6-4-125/64; 6-5 124/65; 6-6 129/65; 6-13 128/69; 6-14 128/66; 6-19 129/69; 6-21 130/74; 6-22 123/68  2. Are you having any other symptoms (ex. Dizziness, headache, blurred vision, passed out)? no 3. What is your BP issue?  Calling to give bp readings

## 2015-03-12 NOTE — Telephone Encounter (Signed)
Patient stopped Maxzide on 02/17/15 due to gas/bloating. (has been added as an allergy)  Was instructed to check BP and report.  Blood pressures off Maxzide are normal.

## 2015-05-10 ENCOUNTER — Telehealth: Payer: Self-pay | Admitting: Internal Medicine

## 2015-05-10 NOTE — Telephone Encounter (Signed)
New message       Pt c/o BP issue: STAT if pt c/o blurred vision, one-sided weakness or slurred speech  1. What are your last 5 BP readings? 175/77; 180/75;  2. Are you having any other symptoms (ex. Dizziness, headache, blurred vision, passed out)? no 3. What is your BP issue? Bp is high

## 2015-05-10 NOTE — Telephone Encounter (Signed)
I called to check on the pt and his BP is now 141/72, pulse 60 after taking an extra Amlodipine 2.5mg . The pt said he did find his list of BP readings and his BP has not been elevated for as long as he thought.  His reading on 8/19 was 132/77, pulse 60 and then on 8/20 it began to increase.  The pt questions if he may have a "bug" that is causing BP to increase.  At this time I advised the pt to continue to monitor his BP and if it remains elevated we will make further medication adjustments.  I will forward this message to Dr Harrington Challenger for review.

## 2015-05-10 NOTE — Telephone Encounter (Signed)
I spoke with the pt and he complains of elevated BP over the past 10 days.  The pt does not report any change to his health in the past 10 days.  The pt is asymptomatic with increased BP. His BP has been running 125-135/62-67 prior to the past 10 days.  The pt cannot locate his BP diary while on the phone with me.  The pt states his BP is now running 170-180/70. His SBP will drop 30 points about one hour after his morning medications. I reviewed the pt's BP medications and he is taking them as directed.  The pt checked his BP while on the phone with me and it was 190/90, pulse 72.  I instructed the pt to take an extra 2.5mg  of Amlodipine now and I will touch base with him later this afternoon to see if his BP has improved.

## 2015-05-11 NOTE — Telephone Encounter (Signed)
Agree with plan outlined

## 2015-05-16 ENCOUNTER — Other Ambulatory Visit: Payer: Self-pay | Admitting: Internal Medicine

## 2015-06-04 DIAGNOSIS — Z23 Encounter for immunization: Secondary | ICD-10-CM | POA: Diagnosis not present

## 2015-06-08 ENCOUNTER — Other Ambulatory Visit: Payer: Self-pay | Admitting: Internal Medicine

## 2015-06-09 DIAGNOSIS — Z23 Encounter for immunization: Secondary | ICD-10-CM | POA: Diagnosis not present

## 2015-06-09 DIAGNOSIS — I10 Essential (primary) hypertension: Secondary | ICD-10-CM | POA: Diagnosis not present

## 2015-06-09 DIAGNOSIS — E780 Pure hypercholesterolemia: Secondary | ICD-10-CM | POA: Diagnosis not present

## 2015-06-09 DIAGNOSIS — D692 Other nonthrombocytopenic purpura: Secondary | ICD-10-CM | POA: Diagnosis not present

## 2015-06-09 DIAGNOSIS — N400 Enlarged prostate without lower urinary tract symptoms: Secondary | ICD-10-CM | POA: Diagnosis not present

## 2015-06-09 DIAGNOSIS — G4700 Insomnia, unspecified: Secondary | ICD-10-CM | POA: Diagnosis not present

## 2015-06-09 DIAGNOSIS — L719 Rosacea, unspecified: Secondary | ICD-10-CM | POA: Diagnosis not present

## 2015-06-13 ENCOUNTER — Other Ambulatory Visit: Payer: Self-pay | Admitting: Internal Medicine

## 2015-07-05 ENCOUNTER — Encounter: Payer: Self-pay | Admitting: Internal Medicine

## 2015-07-05 ENCOUNTER — Ambulatory Visit (INDEPENDENT_AMBULATORY_CARE_PROVIDER_SITE_OTHER): Payer: Medicare Other | Admitting: Internal Medicine

## 2015-07-05 VITALS — BP 150/98 | HR 61 | Ht 70.5 in | Wt 182.8 lb

## 2015-07-05 DIAGNOSIS — I495 Sick sinus syndrome: Secondary | ICD-10-CM | POA: Diagnosis not present

## 2015-07-05 DIAGNOSIS — Z950 Presence of cardiac pacemaker: Secondary | ICD-10-CM

## 2015-07-05 DIAGNOSIS — I10 Essential (primary) hypertension: Secondary | ICD-10-CM

## 2015-07-05 LAB — CUP PACEART INCLINIC DEVICE CHECK
Battery Impedance: 1000 Ohm
Battery Voltage: 2.79 V
Brady Statistic RA Percent Paced: 55 %
Brady Statistic RV Percent Paced: 3 %
Date Time Interrogation Session: 20161017125652
Implantable Lead Implant Date: 20091023
Implantable Lead Implant Date: 20091023
Implantable Lead Location: 753859
Implantable Lead Location: 753860
Lead Channel Impedance Value: 367 Ohm
Lead Channel Impedance Value: 429 Ohm
Lead Channel Pacing Threshold Amplitude: 0.75 V
Lead Channel Pacing Threshold Amplitude: 1 V
Lead Channel Pacing Threshold Pulse Width: 0.5 ms
Lead Channel Pacing Threshold Pulse Width: 0.5 ms
Lead Channel Sensing Intrinsic Amplitude: 1.7 mV
Lead Channel Sensing Intrinsic Amplitude: 12 mV
Lead Channel Setting Pacing Amplitude: 2 V
Lead Channel Setting Pacing Amplitude: 2.5 V
Lead Channel Setting Pacing Pulse Width: 0.5 ms
Lead Channel Setting Sensing Sensitivity: 2 mV
Pulse Gen Model: 5826
Pulse Gen Serial Number: 2231585

## 2015-07-05 NOTE — Progress Notes (Signed)
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 male who presents today for routine electrophysiology followup.  Since last being seen in our clinic, the patient reports doing very well.  He continues to exercise regularly.  Today, he denies symptoms of palpitations, chest pain, shortness of breath,  lower extremity edema, dizziness, presyncope, or syncope.  The patient is otherwise without complaint today.   Past Medical History  Diagnosis Date  . Symptomatic bradycardia     a. s/p STJ dual chamber pacemaker  . Coronary artery disease     PCI RCA 2009  . Hypertension   . Dyslipidemia   . Seizure [facility])     felt due to cyclosporin in setting of electrolyte disorder  . Scleritis, unspecified     treated with prednisone  . Solitary kidney    Past Surgical History  Procedure Laterality Date  . Pacemaker insertion  2009    STJ dual chamber pacemaker implanted by Dr Olevia Perches for symptomatic bradycardia    Current Outpatient Prescriptions  Medication Sig Dispense Refill  . amLODipine (NORVASC) 5 MG tablet TAKE ONE TABLET BY MOUTH ONCE DAILY 30 tablet 1  . aspirin 81 MG tablet Take 81 mg by mouth daily.     . clopidogrel (PLAVIX) 75 MG tablet TAKE ONE TABLET BY MOUTH ONCE DAILY 90 tablet 2  . metoprolol tartrate (LOPRESSOR) 25 MG tablet Take 1 tablet (25 mg total) by mouth 2 (two) times daily. 60 tablet 0  . metroNIDAZOLE (METROCREAM) 0.75 % cream Apply 1 application topically 2 (two) times daily as needed (skin).     . Multiple Vitamins-Minerals (CENTRUM SILVER PO) Take 1 capsule by mouth daily.     Marland Kitchen NITROSTAT 0.4 MG SL tablet DISSOLVE ONE TABLET UNDER THE TONGUE EVERY 5 MINUTES AS NEEDED FOR CHEST PAIN.  DO NOT EXCEED A TOTAL OF 3 DOSES IN 15 MINUTES WITHOUT  SEEK 25 tablet 3  . OVER THE COUNTER MEDICATION Take 1 capsule by mouth daily as needed (constipation). Stool softner    . Simethicone (GAS-X PO) Take 1 capsule by mouth daily as needed (GAS).     Marland Kitchen simvastatin (ZOCOR) 40 MG tablet TAKE ONE-HALF TABLET BY MOUTH AT BEDTIME 30 tablet 3  . TEMAZEPAM PO Take 15 mg by mouth 2 (two) times daily.      No current facility-administered medications for this visit.    Physical Exam: Filed Vitals:   07/05/15 1108  BP: 150/98  Pulse: 61  Height: 5' 10.5" (1.791 m)  Weight: 182 lb 12.8 oz (82.918 kg)    GEN- The patient is well appearing, alert and oriented x 3 today.   Head- normocephalic, atraumatic Eyes-  Sclera clear, conjunctiva pink Ears- hearing intact Oropharynx- clear Lungs- Clear to ausculation bilaterally, normal work of breathing Chest- pacemaker pocket is well healed Heart- Regular rate and rhythm, no murmurs, rubs or gallops, PMI not laterally displaced GI- soft, NT, ND, + BS Extremities- no clubbing, cyanosis, or edema  Pacemaker interrogation- reviewed in detail today,  See PACEART report  Assessment and Plan:  1. Sinus bradycardia/ sick sinus syndrome Normal pacemaker function Previously Atrial sensing (bipolar) was decreased, but normal unipolar Atrial sensing is therefore unipolar See Pace Art report  2. Mode switches He has many atrial lead mode switches but longest episode is 1 minute.  Given unipolar atrial sensing, I am not convinced that this represents true arrhythmias.   Would not initiate anticoagulation at this time.  3. HTn Stable No change required today He brings home values which are all pretty much normal   Follow-up with EP device NP every 6 months I will see when needed  Thompson Grayer MD, [facility] 07/05/2015 11:42 AM

## 2015-07-05 NOTE — Patient Instructions (Signed)
Medication Instructions:  Your physician recommends that you continue on your current medications as directed. Please refer to the Current Medication list given to you today.   Labwork: None ordered  Testing/Procedures: None ordered  Follow-Up: Your physician wants you to follow-up in: 6 months with Amber Seiler, NP You will receive a reminder letter in the mail two months in advance. If you don't receive a letter, please call our office to schedule the follow-up appointment.   Any Other Special Instructions Will Be Listed Below (If Applicable).   

## 2015-07-15 ENCOUNTER — Other Ambulatory Visit: Payer: Self-pay | Admitting: Internal Medicine

## 2015-08-06 ENCOUNTER — Other Ambulatory Visit: Payer: Self-pay | Admitting: Internal Medicine

## 2015-09-21 DIAGNOSIS — Z950 Presence of cardiac pacemaker: Secondary | ICD-10-CM | POA: Diagnosis not present

## 2015-09-21 DIAGNOSIS — I495 Sick sinus syndrome: Secondary | ICD-10-CM | POA: Diagnosis not present

## 2015-10-07 ENCOUNTER — Other Ambulatory Visit: Payer: Self-pay

## 2015-10-07 NOTE — Telephone Encounter (Signed)
error 

## 2015-10-09 ENCOUNTER — Other Ambulatory Visit: Payer: Self-pay | Admitting: Internal Medicine

## 2015-10-11 ENCOUNTER — Other Ambulatory Visit: Payer: Self-pay | Admitting: Internal Medicine

## 2015-10-11 MED ORDER — NITROSTAT 0.4 MG SL SUBL: SUBLINGUAL_TABLET | SUBLINGUAL | Status: DC

## 2015-11-05 ENCOUNTER — Telehealth: Payer: Self-pay | Admitting: Internal Medicine

## 2015-11-05 NOTE — Telephone Encounter (Signed)
New message        *STAT* If patient is at the pharmacy, call can be transferred to refill team.   1. Which medications need to be refilled? (please list name of each medication and dose if known) amlodipine 5mg  2. Which pharmacy/location (including street and city if local pharmacy) is medication to be sent to? [facility] in [place]  3. Do they need a 30 day or 90 day supply? 30 day

## 2015-11-10 ENCOUNTER — Other Ambulatory Visit: Payer: Self-pay | Admitting: *Deleted

## 2015-11-10 ENCOUNTER — Other Ambulatory Visit: Payer: Self-pay | Admitting: Internal Medicine

## 2015-11-10 MED ORDER — AMLODIPINE BESYLATE 5 MG PO TABS: 5.0000 mg | ORAL_TABLET | Freq: Every day | ORAL | Status: DC

## 2015-12-01 ENCOUNTER — Other Ambulatory Visit: Payer: Self-pay | Admitting: Internal Medicine

## 2015-12-01 ENCOUNTER — Other Ambulatory Visit: Payer: Self-pay

## 2015-12-01 MED ORDER — SIMVASTATIN 40 MG PO TABS: 20.0000 mg | ORAL_TABLET | Freq: Every day | ORAL | Status: DC

## 2015-12-20 ENCOUNTER — Ambulatory Visit (INDEPENDENT_AMBULATORY_CARE_PROVIDER_SITE_OTHER): Payer: Medicare Other | Admitting: Internal Medicine

## 2015-12-20 VITALS — BP 138/74 | HR 64 | Ht 70.5 in | Wt 176.4 lb

## 2015-12-20 DIAGNOSIS — I779 Disorder of arteries and arterioles, unspecified: Secondary | ICD-10-CM

## 2015-12-20 DIAGNOSIS — I739 Peripheral vascular disease, unspecified: Principal | ICD-10-CM

## 2015-12-20 NOTE — Patient Instructions (Signed)
Your physician recommends that you continue on your current medications as directed. Please refer to the Current Medication list given to you today.  Your physician has requested that you have a carotid duplex. This test is an ultrasound of the carotid arteries in your neck. It looks at blood flow through these arteries that supply the brain with blood. Allow one hour for this exam. There are no restrictions or special instructions.  Your physician wants you to follow-up in: 1 year with Dr. Ross.  You will receive a reminder letter in the mail two months in advance. If you don't receive a letter, please call our office to schedule the follow-up appointment.    

## 2015-12-20 NOTE — Progress Notes (Addendum)
Cardiology Office Note   Date:  12/20/2015   ID:  [name], DOB 1931-05-28, MRN [id number]  PCP:  Shirline Frees, MD  Cardiologist:   Dorris Carnes, MD   F/U Of CAD    History of Present Illness: [name] is a 82 y.o. male with a history of CADs/p rotation atherectomy and DES to the RCA in 10/09 (LHC at that time: prox to mid RCA 90-95% then 90% - tx with PCI; dRCA 40-60%, LAD 70-80% after D2, 40-50% at D1, oD2 60-70%, oCFX 40%, OM1 80%, AVCFX 30-40%). Last echo 10/09: EF 60-65%. Other hx includes bradycardia, s/p pacer, HTN, hyperlipidemia, seizure  D (s/p PCI to RCA 2009), HTN, HL I saw him in [place] in Jan 2014. He was seen by J Allred in [place] last fall.   just got back from [facility]  DOing great  No SOB  No CP  No dizzinesss      Outpatient Prescriptions Prior to Visit  Medication Sig Dispense Refill  . amLODipine (NORVASC) 5 MG tablet TAKE ONE TABLET BY MOUTH ONCE DAILY 30 tablet 2  . aspirin 81 MG tablet Take 81 mg by mouth daily.     . clopidogrel (PLAVIX) 75 MG tablet TAKE ONE TABLET BY MOUTH ONCE DAILY 90 tablet 0  . metoprolol tartrate (LOPRESSOR) 25 MG tablet Take 1 tablet (25 mg total) by mouth 2 (two) times daily. 60 tablet 0  . metroNIDAZOLE (METROCREAM) 0.75 % cream Apply 1 application topically 2 (two) times daily as needed (skin).     . Multiple Vitamins-Minerals (CENTRUM SILVER PO) Take 1 capsule by mouth daily.     Marland Kitchen NITROSTAT 0.4 MG SL tablet DISSOLVE ONE TABLET UNDER THE TONGUE EVERY 5 MINUTES AS NEEDED FOR CHEST PAIN.  DO NOT EXCEED A TOTAL OF 3 DOSES IN 15 MINUTES WITHOUT  SEEK 25 tablet 1  . OVER THE COUNTER MEDICATION Take 1 capsule by mouth daily as needed (constipation). Stool softner    . Simethicone (GAS-X PO) Take 1 capsule by mouth daily as needed (GAS).    Marland Kitchen simvastatin (ZOCOR) 40 MG tablet Take 0.5 tablets (20 mg total) by mouth at bedtime. 15 tablet 3  . TEMAZEPAM PO Take 15 mg by mouth 2 (two) times daily.     Marland Kitchen amLODipine (NORVASC) 5 MG  tablet Take 1 tablet (5 mg total) by mouth daily. (Patient not taking: Reported on 12/20/2015) 60 tablet 0  . metoprolol tartrate (LOPRESSOR) 25 MG tablet TAKE ONE TABLET BY MOUTH TWICE DAILY (Patient not taking: Reported on 12/20/2015) 60 tablet 2   No facility-administered medications prior to visit.     Allergies:   Cyclosporine; Maxzide; and Prednisone   Past Medical History  Diagnosis Date  . Symptomatic bradycardia     a. s/p STJ dual chamber pacemaker  . Coronary artery disease     PCI RCA 2009  . Hypertension   . Dyslipidemia   . Seizure [facility])     felt due to cyclosporin in setting of electrolyte disorder  . Scleritis, unspecified     treated with prednisone  . Solitary kidney     Past Surgical History  Procedure Laterality Date  . Pacemaker insertion  2009    STJ dual chamber pacemaker implanted by Dr Olevia Perches for symptomatic bradycardia     Social History:  The patient  reports that he has never smoked. He does not have any smokeless tobacco history on file. He reports that he does not drink  alcohol or use illicit drugs.   Family History:  The patient's family history includes Arthritis/Rheumatoid in his mother.    ROS:  Please see the history of present illness. All other systems are reviewed and  Negative to the above problem except as noted.    PHYSICAL EXAM: VS:  BP 138/74 mmHg  Pulse 64  Ht 5' 10.5" (1.791 m)  Wt 176 lb 6.4 oz (80.015 kg)  BMI 24.94 kg/m2  GEN: Well nourished, well developed, in no acute distress HEENT: normal Neck: no JVD, carotid bruits, or masses Cardiac: RRR; no murmurs, rubs, or gallops,no edema  Respiratory:  clear to auscultation bilaterally, normal work of breathing GI: soft, nontender, nondistended, + BS  No hepatomegaly  MS: no deformity Moving all extremities   Skin: warm and dry, no rash Neuro:  Strength and sensation are intact Psych: euthymic mood, full affect   EKG:  EKG is ordered today.  SR 64 bpm     Lipid Panel     Component Value Date/Time   CHOL 135 06/29/2009 1230   TRIG 37.0 06/29/2009 1230   HDL 63.70 06/29/2009 1230   CHOLHDL 2 06/29/2009 1230   VLDL 7.4 06/29/2009 1230   LDLCALC 64 06/29/2009 1230      Wt Readings from Last 3 Encounters:  12/20/15 176 lb 6.4 oz (80.015 kg)  07/05/15 182 lb 12.8 oz (82.918 kg)  01/04/15 170 lb 12.8 oz (77.474 kg)      ASSESSMENT AND PLAN:  1  CAD  No symptoms of angina    2  HL  Asked him to get labs faxed from primary MD  Keep on statin  3  HTN  Good control  Stay active   F/U next year     Signed, Dorris Carnes, MD  12/20/2015 4:08 PM    [facility] [place], [place], [place]  [address] Phone: [phone]; Fax: [phone]

## 2015-12-30 ENCOUNTER — Ambulatory Visit (HOSPITAL_COMMUNITY)
Admission: RE | Admit: 2015-12-30 | Discharge: 2015-12-30 | Disposition: A | Discharge status: Home / Self Care | Payer: Medicare Other | Source: Ambulatory Visit | Attending: Internal Medicine | Admitting: Internal Medicine

## 2015-12-30 DIAGNOSIS — I739 Peripheral vascular disease, unspecified: Secondary | ICD-10-CM

## 2015-12-30 DIAGNOSIS — I779 Disorder of arteries and arterioles, unspecified: Secondary | ICD-10-CM | POA: Insufficient documentation

## 2015-12-30 DIAGNOSIS — E785 Hyperlipidemia, unspecified: Secondary | ICD-10-CM | POA: Diagnosis not present

## 2015-12-30 DIAGNOSIS — I10 Essential (primary) hypertension: Secondary | ICD-10-CM | POA: Diagnosis not present

## 2015-12-30 DIAGNOSIS — I6523 Occlusion and stenosis of bilateral carotid arteries: Secondary | ICD-10-CM | POA: Insufficient documentation

## 2016-01-07 ENCOUNTER — Encounter: Payer: Self-pay | Admitting: Internal Medicine

## 2016-01-07 ENCOUNTER — Telehealth: Payer: Self-pay | Admitting: *Deleted

## 2016-01-07 DIAGNOSIS — I739 Peripheral vascular disease, unspecified: Secondary | ICD-10-CM

## 2016-01-07 DIAGNOSIS — G4700 Insomnia, unspecified: Secondary | ICD-10-CM | POA: Diagnosis not present

## 2016-01-07 DIAGNOSIS — L719 Rosacea, unspecified: Secondary | ICD-10-CM | POA: Diagnosis not present

## 2016-01-07 DIAGNOSIS — E7800 Pure hypercholesterolemia, unspecified: Secondary | ICD-10-CM | POA: Diagnosis not present

## 2016-01-07 DIAGNOSIS — I2510 Atherosclerotic heart disease of native coronary artery without angina pectoris: Secondary | ICD-10-CM

## 2016-01-07 DIAGNOSIS — I10 Essential (primary) hypertension: Secondary | ICD-10-CM | POA: Diagnosis not present

## 2016-01-07 DIAGNOSIS — N400 Enlarged prostate without lower urinary tract symptoms: Secondary | ICD-10-CM | POA: Diagnosis not present

## 2016-01-07 DIAGNOSIS — I779 Disorder of arteries and arterioles, unspecified: Secondary | ICD-10-CM

## 2016-01-07 NOTE — Telephone Encounter (Signed)
Pt notified of carotid US results and findings by phone with verbal understanding. Repeat in 1 yr; orders placed in Epic today.

## 2016-01-10 ENCOUNTER — Encounter: Payer: Self-pay | Admitting: Internal Medicine

## 2016-01-17 DIAGNOSIS — L570 Actinic keratosis: Secondary | ICD-10-CM | POA: Diagnosis not present

## 2016-01-17 DIAGNOSIS — Z85828 Personal history of other malignant neoplasm of skin: Secondary | ICD-10-CM | POA: Diagnosis not present

## 2016-01-17 DIAGNOSIS — D1801 Hemangioma of skin and subcutaneous tissue: Secondary | ICD-10-CM | POA: Diagnosis not present

## 2016-01-17 DIAGNOSIS — L821 Other seborrheic keratosis: Secondary | ICD-10-CM | POA: Diagnosis not present

## 2016-01-17 DIAGNOSIS — D692 Other nonthrombocytopenic purpura: Secondary | ICD-10-CM | POA: Diagnosis not present

## 2016-01-17 DIAGNOSIS — D225 Melanocytic nevi of trunk: Secondary | ICD-10-CM | POA: Diagnosis not present

## 2016-01-19 NOTE — Progress Notes (Signed)
Electrophysiology Office Note Date: 01/20/2016  ID:  REGINAL Romero, DOB 09-07-1931, MRN [id number]  PCP: Shirline Frees, MD Primary Cardiologist: Harrington Challenger Electrophysiologist: Allred  CC: Pacemaker follow-up  [name] is a 82 y.o. male seen today for Dr [name].  He presents today for routine electrophysiology followup.  Since last being seen in our clinic, the patient reports doing very well.  He denies chest pain, palpitations, dyspnea, PND, orthopnea, nausea, vomiting, dizziness, syncope, edema, weight gain, or early satiety.  Device History: STJ dual chamber PPM implanted 2009 for symptomatic bradycardia   Past Medical History  Diagnosis Date  . Symptomatic bradycardia     a. s/p STJ dual chamber pacemaker  . Coronary artery disease     PCI RCA 2009  . Hypertension   . Dyslipidemia   . Seizure [facility])     felt due to cyclosporin in setting of electrolyte disorder  . Scleritis, unspecified     treated with prednisone  . Solitary kidney    Past Surgical History  Procedure Laterality Date  . Pacemaker insertion  2009    STJ dual chamber pacemaker implanted by Dr Olevia Perches for symptomatic bradycardia    Current Outpatient Prescriptions  Medication Sig Dispense Refill  . amLODipine (NORVASC) 5 MG tablet TAKE ONE TABLET BY MOUTH ONCE DAILY 30 tablet 2  . aspirin 81 MG tablet Take 81 mg by mouth daily.     . beta carotene w/minerals (OCUVITE) tablet Take 1 tablet by mouth daily.    . clopidogrel (PLAVIX) 75 MG tablet TAKE ONE TABLET BY MOUTH ONCE DAILY 90 tablet 0  . finasteride (PROSCAR) 5 MG tablet Take 5 mg by mouth daily.    . metoprolol tartrate (LOPRESSOR) 25 MG tablet Take 1 tablet (25 mg total) by mouth 2 (two) times daily. 60 tablet 0  . metroNIDAZOLE (METROCREAM) 0.75 % cream Apply 1 application topically 2 (two) times daily as needed (skin).     . Multiple Vitamins-Minerals (CENTRUM SILVER PO) Take 1 capsule by mouth daily.     Marland Kitchen NITROSTAT 0.4 MG SL tablet DISSOLVE  ONE TABLET UNDER THE TONGUE EVERY 5 MINUTES AS NEEDED FOR CHEST PAIN.  DO NOT EXCEED A TOTAL OF 3 DOSES IN 15 MINUTES WITHOUT  SEEK 25 tablet 1  . OVER THE COUNTER MEDICATION Take 1 capsule by mouth daily as needed (constipation). Stool softner    . Simethicone (GAS-X PO) Take 1 capsule by mouth daily as needed (GAS).    Marland Kitchen simvastatin (ZOCOR) 40 MG tablet Take 0.5 tablets (20 mg total) by mouth at bedtime. 15 tablet 3  . TEMAZEPAM PO Take 15 mg by mouth 2 (two) times daily.      No current facility-administered medications for this visit.    Allergies:   Cyclosporine; Maxzide; and Prednisone   Social History: Social History   Social History  . Marital Status: Widowed    Spouse Name: N/A  . Number of Children: 2  . Years of Education: college   Occupational History  . retired    Social History Main Topics  . Smoking status: Never Smoker   . Smokeless tobacco: Not on file  . Alcohol Use: No  . Drug Use: No  . Sexual Activity: Not on file   Other Topics Concern  . Not on file   Social History Narrative   Married   Tobacco   etoh    Family History: Family History  Problem Relation Age of Onset  . Arthritis/Rheumatoid  Mother      Review of Systems: All other systems reviewed and are otherwise negative except as noted above.   Physical Exam: VS:  BP 140/84 mmHg  Pulse 65  Ht 5' 10.5" (1.791 m)  Wt 178 lb 6.4 oz (80.922 kg)  BMI 25.23 kg/m2 , BMI Body mass index is 25.23 kg/(m^2).  GEN- The patient is well appearing, alert and oriented x 3 today.   HEENT: normocephalic, atraumatic; sclera clear, conjunctiva pink; hearing intact; oropharynx clear; neck supple  Lungs- Clear to ausculation bilaterally, normal work of breathing.  No wheezes, rales, rhonchi Heart- Regular rate and rhythm, no murmurs, rubs or gallops  GI- soft, non-tender, non-distended, bowel sounds present  Extremities- no clubbing, cyanosis, or edema; DP/PT/radial pulses 2+ bilaterally MS- no  significant deformity or atrophy Skin- warm and dry, no rash or lesion; PPM pocket well healed Psych- euthymic mood, full affect Neuro- strength and sensation are intact  PPM Interrogation- reviewed in detail today,  See PACEART report  EKG:  EKG is not ordered today.  Recent Labs: No results found for requested labs within last 365 days.   Wt Readings from Last 3 Encounters:  01/20/16 178 lb 6.4 oz (80.922 kg)  12/20/15 176 lb 6.4 oz (80.015 kg)  07/05/15 182 lb 12.8 oz (82.918 kg)     Other studies Reviewed: Additional studies/ records that were reviewed today include: Dr [name] and Dr [name] office notes  Assessment and Plan:  1.  Symptomatic bradycardia Normal PPM function See Pace Art report No changes today  2.  Mode switches Pt with unipolar sensing on pacemaker programming All mode switches <3 minutes Do not likely represent true AF Will follow for now, if burden or duration of episodes increases, may need to consider [place]  3.  HTN Stable No change required today   Current medicines are reviewed at length with the patient today.   The patient does not have concerns regarding his medicines.  The following changes were made today:  none  Labs/ tests ordered today include: none    Disposition:   Follow up with Dr Harrington Challenger as scheduled, device clinic 6 months, me in 1 year    Signed, Roberto Marshall, NP 01/20/2016 9:48 AM  [facility] [address] [place] [address] [phone] (office) [phone] (fax

## 2016-01-20 ENCOUNTER — Ambulatory Visit (INDEPENDENT_AMBULATORY_CARE_PROVIDER_SITE_OTHER): Payer: Medicare Other | Admitting: Nurse Practitioner

## 2016-01-20 ENCOUNTER — Encounter: Payer: Self-pay | Admitting: Nurse Practitioner

## 2016-01-20 ENCOUNTER — Encounter: Payer: Self-pay | Admitting: Internal Medicine

## 2016-01-20 VITALS — BP 140/84 | HR 65 | Ht 70.5 in | Wt 178.4 lb

## 2016-01-20 DIAGNOSIS — I10 Essential (primary) hypertension: Secondary | ICD-10-CM

## 2016-01-20 DIAGNOSIS — R001 Bradycardia, unspecified: Secondary | ICD-10-CM

## 2016-01-20 DIAGNOSIS — I2510 Atherosclerotic heart disease of native coronary artery without angina pectoris: Secondary | ICD-10-CM

## 2016-01-20 LAB — CUP PACEART INCLINIC DEVICE CHECK
Battery Impedance: 1100 Ohm
Battery Voltage: 2.79 V
Date Time Interrogation Session: 20170504132828
Implantable Lead Implant Date: 20091023
Implantable Lead Implant Date: 20091023
Implantable Lead Location: 753859
Implantable Lead Location: 753860
Lead Channel Impedance Value: 360 Ohm
Lead Channel Impedance Value: 420 Ohm
Lead Channel Pacing Threshold Amplitude: 0.75 V
Lead Channel Pacing Threshold Amplitude: 1 V
Lead Channel Pacing Threshold Pulse Width: 0.5 ms
Lead Channel Pacing Threshold Pulse Width: 0.5 ms
Lead Channel Sensing Intrinsic Amplitude: 1.7 mV
Lead Channel Sensing Intrinsic Amplitude: 12 mV
Lead Channel Setting Pacing Amplitude: 2 V
Lead Channel Setting Pacing Amplitude: 2.5 V
Lead Channel Setting Pacing Pulse Width: 0.5 ms
Lead Channel Setting Sensing Sensitivity: 2 mV
Pulse Gen Model: 5826
Pulse Gen Serial Number: 2231585

## 2016-01-20 NOTE — Patient Instructions (Signed)
Medication Instructions:   Your physician recommends that you continue on your current medications as directed. Please refer to the Current Medication list given to you today.    If you need a refill on your cardiac medications before your next appointment, please call your pharmacy.  Labwork: NONE ORDER TODAY\   Testing/Procedures: NONE ORDER TODAY    Follow-Up: Your physician wants you to follow-up in:  IN  [place] you  will receive a reminder letter in the mail two months in advance. If you don't receive a letter, please call our office to schedule the follow-up appointment.   Your physician wants you to follow-up in: [place] will receive a reminder letter in the mail two months in advance. If you don't receive a letter, please call our office to schedule the follow-up appointment.     Any Other Special Instructions Will Be Listed Below (If Applicable).

## 2016-02-01 ENCOUNTER — Other Ambulatory Visit: Payer: Self-pay | Admitting: Internal Medicine

## 2016-02-22 ENCOUNTER — Other Ambulatory Visit: Payer: Self-pay | Admitting: Internal Medicine

## 2016-03-18 ENCOUNTER — Other Ambulatory Visit: Payer: Self-pay | Admitting: Internal Medicine

## 2016-04-12 ENCOUNTER — Other Ambulatory Visit: Payer: Self-pay | Admitting: Internal Medicine

## 2016-04-24 ENCOUNTER — Encounter: Payer: Self-pay | Admitting: Internal Medicine

## 2016-04-24 DIAGNOSIS — I495 Sick sinus syndrome: Secondary | ICD-10-CM | POA: Diagnosis not present

## 2016-04-24 DIAGNOSIS — Z950 Presence of cardiac pacemaker: Secondary | ICD-10-CM | POA: Diagnosis not present

## 2016-05-16 DIAGNOSIS — Z23 Encounter for immunization: Secondary | ICD-10-CM | POA: Diagnosis not present

## 2016-05-17 DIAGNOSIS — Z961 Presence of intraocular lens: Secondary | ICD-10-CM | POA: Diagnosis not present

## 2016-05-17 DIAGNOSIS — H179 Unspecified corneal scar and opacity: Secondary | ICD-10-CM | POA: Diagnosis not present

## 2016-07-07 DIAGNOSIS — L719 Rosacea, unspecified: Secondary | ICD-10-CM | POA: Diagnosis not present

## 2016-07-07 DIAGNOSIS — E7800 Pure hypercholesterolemia, unspecified: Secondary | ICD-10-CM | POA: Diagnosis not present

## 2016-07-07 DIAGNOSIS — G4700 Insomnia, unspecified: Secondary | ICD-10-CM | POA: Diagnosis not present

## 2016-07-07 DIAGNOSIS — I10 Essential (primary) hypertension: Secondary | ICD-10-CM | POA: Diagnosis not present

## 2016-07-07 DIAGNOSIS — N400 Enlarged prostate without lower urinary tract symptoms: Secondary | ICD-10-CM | POA: Diagnosis not present

## 2016-07-19 ENCOUNTER — Encounter: Payer: Self-pay | Admitting: Internal Medicine

## 2016-07-19 ENCOUNTER — Ambulatory Visit (INDEPENDENT_AMBULATORY_CARE_PROVIDER_SITE_OTHER): Payer: Medicare Other | Admitting: *Deleted

## 2016-07-19 DIAGNOSIS — I495 Sick sinus syndrome: Secondary | ICD-10-CM

## 2016-07-19 DIAGNOSIS — Z950 Presence of cardiac pacemaker: Secondary | ICD-10-CM | POA: Diagnosis not present

## 2016-07-19 NOTE — Patient Instructions (Addendum)
Your physician wants you to follow-up in: May 2018 with Dr. Rayann Heman. You will receive a reminder letter in the mail two months in advance. If you don't receive a letter, please call our office to schedule the follow-up appointment.

## 2016-07-20 LAB — CUP PACEART INCLINIC DEVICE CHECK
Battery Impedance: 1400 Ohm
Battery Voltage: 2.79 V
Date Time Interrogation Session: 20171101131356
Implantable Lead Implant Date: 20091023
Implantable Lead Implant Date: 20091023
Implantable Lead Location: 753859
Implantable Lead Location: 753860
Implantable Pulse Generator Implant Date: 20091023
Lead Channel Impedance Value: 365 Ohm
Lead Channel Impedance Value: 424 Ohm
Lead Channel Pacing Threshold Amplitude: 1 V
Lead Channel Pacing Threshold Amplitude: 1 V
Lead Channel Pacing Threshold Pulse Width: 0.5 ms
Lead Channel Pacing Threshold Pulse Width: 0.5 ms
Lead Channel Sensing Intrinsic Amplitude: 1.5 mV
Lead Channel Sensing Intrinsic Amplitude: 12 mV
Lead Channel Setting Pacing Amplitude: 2 V
Lead Channel Setting Pacing Amplitude: 2.5 V
Lead Channel Setting Pacing Pulse Width: 0.5 ms
Lead Channel Setting Sensing Sensitivity: 2 mV
Pulse Gen Model: 5826
Pulse Gen Serial Number: 2231585

## 2016-07-20 NOTE — Progress Notes (Signed)
Pacemaker check in clinic. Normal device function. Thresholds, sensing, impedances consistent with previous measurements. Device programmed to maximize longevity. 25,979 mode switches (3%), longest 38 sec, likely noise (reproducible with isometrics). No high ventricular rates noted. Device programmed at appropriate safety margins. Histogram distribution appropriate for patient activity level. Device programmed to optimize intrinsic conduction. Estimated longevity 3.75-6.75 years. Patient education completed. ROV with JA in 01/2017.

## 2016-09-16 ENCOUNTER — Other Ambulatory Visit: Payer: Self-pay | Admitting: Internal Medicine

## 2016-12-09 ENCOUNTER — Other Ambulatory Visit: Payer: Self-pay | Admitting: Internal Medicine

## 2016-12-11 ENCOUNTER — Other Ambulatory Visit: Payer: Self-pay | Admitting: Internal Medicine

## 2016-12-11 MED ORDER — METOPROLOL TARTRATE 25 MG PO TABS: 25.0000 mg | ORAL_TABLET | Freq: Two times a day (BID) | ORAL | 0 refills | Status: DC

## 2016-12-27 ENCOUNTER — Encounter: Payer: Self-pay | Admitting: Internal Medicine

## 2017-01-05 ENCOUNTER — Other Ambulatory Visit: Payer: Self-pay | Admitting: Internal Medicine

## 2017-01-05 DIAGNOSIS — I10 Essential (primary) hypertension: Secondary | ICD-10-CM | POA: Diagnosis not present

## 2017-01-05 DIAGNOSIS — E7800 Pure hypercholesterolemia, unspecified: Secondary | ICD-10-CM | POA: Diagnosis not present

## 2017-01-05 DIAGNOSIS — N400 Enlarged prostate without lower urinary tract symptoms: Secondary | ICD-10-CM | POA: Diagnosis not present

## 2017-01-05 DIAGNOSIS — L719 Rosacea, unspecified: Secondary | ICD-10-CM | POA: Diagnosis not present

## 2017-01-05 DIAGNOSIS — G4700 Insomnia, unspecified: Secondary | ICD-10-CM | POA: Diagnosis not present

## 2017-01-12 ENCOUNTER — Ambulatory Visit (HOSPITAL_COMMUNITY)
Admission: RE | Admit: 2017-01-12 | Discharge: 2017-01-12 | Disposition: A | Discharge status: Home / Self Care | Payer: Medicare Other | Source: Ambulatory Visit | Attending: Cardiovascular Disease | Admitting: Cardiovascular Disease

## 2017-01-12 DIAGNOSIS — Z8673 Personal history of transient ischemic attack (TIA), and cerebral infarction without residual deficits: Secondary | ICD-10-CM | POA: Insufficient documentation

## 2017-01-12 DIAGNOSIS — I6523 Occlusion and stenosis of bilateral carotid arteries: Secondary | ICD-10-CM | POA: Insufficient documentation

## 2017-01-12 DIAGNOSIS — I2510 Atherosclerotic heart disease of native coronary artery without angina pectoris: Secondary | ICD-10-CM | POA: Diagnosis not present

## 2017-01-12 DIAGNOSIS — I779 Disorder of arteries and arterioles, unspecified: Secondary | ICD-10-CM

## 2017-01-12 DIAGNOSIS — I10 Essential (primary) hypertension: Secondary | ICD-10-CM | POA: Diagnosis not present

## 2017-01-12 DIAGNOSIS — E119 Type 2 diabetes mellitus without complications: Secondary | ICD-10-CM | POA: Insufficient documentation

## 2017-01-12 DIAGNOSIS — E785 Hyperlipidemia, unspecified: Secondary | ICD-10-CM | POA: Insufficient documentation

## 2017-01-12 DIAGNOSIS — I739 Peripheral vascular disease, unspecified: Secondary | ICD-10-CM

## 2017-01-15 ENCOUNTER — Encounter: Payer: Self-pay | Admitting: Internal Medicine

## 2017-01-15 ENCOUNTER — Ambulatory Visit (INDEPENDENT_AMBULATORY_CARE_PROVIDER_SITE_OTHER): Payer: Medicare Other | Admitting: Internal Medicine

## 2017-01-15 VITALS — BP 160/90 | HR 60 | Ht 70.5 in | Wt 180.2 lb

## 2017-01-15 DIAGNOSIS — E782 Mixed hyperlipidemia: Secondary | ICD-10-CM | POA: Diagnosis not present

## 2017-01-15 DIAGNOSIS — I10 Essential (primary) hypertension: Secondary | ICD-10-CM | POA: Diagnosis not present

## 2017-01-15 DIAGNOSIS — I2510 Atherosclerotic heart disease of native coronary artery without angina pectoris: Secondary | ICD-10-CM

## 2017-01-15 MED ORDER — ATORVASTATIN CALCIUM 40 MG PO TABS: 40.0000 mg | ORAL_TABLET | Freq: Every day | ORAL | 3 refills | Status: DC

## 2017-01-15 MED ORDER — AMLODIPINE BESYLATE 5 MG PO TABS: 5.0000 mg | ORAL_TABLET | Freq: Two times a day (BID) | ORAL | 3 refills | Status: DC

## 2017-01-15 NOTE — Patient Instructions (Signed)
Your physician has recommended you make the following change in your medication:  1.) stop simvastatin (Zocor) 2.) start atorvastatin (Lipitor) 40 mg once daily for cholesterol 3.) increase amlodipine to 5 mg two times per day  Your physician recommends that you schedule a follow-up appointment in: June, 2018 with Dr. Harrington Challenger.  Bring your blood pressure cuff to this visit.

## 2017-01-15 NOTE — Progress Notes (Signed)
Cardiology Office Note   Date:  01/15/2017   ID:  ANHAD SHEELEY, DOB [birth year], MRN [id number]  PCP:  Shirline Frees, MD  Cardiologist:   Dorris Carnes, MD   F/U of CAD     History of Present Illness: [name] is a 83 y.o. male with a history of CADs/p rotation atherectomy and DES to the RCA in 10/09 (LHC at that time: prox to mid RCA 90-95% then 90% - tx with PCI; dRCA 40-60%, LAD 70-80% after D2, 40-50% at D1, oD2 60-70%, oCFX 40%, OM1 80%, AVCFX 30-40%). Last echo 10/09: EF 60-65%. Other hx includes bradycardia, s/p pacer, HTN, hyperlipidemia, seizure  D (s/p PCI to RCA 2009), HTN, HL I saw him in April 2017  He has been seen by A Seiler since    Lipids on 4/25 total was 149  LDL was 79  HDL 57    Breathing good  No CP  BP has been running high     Current Meds  Medication Sig  . amLODipine (NORVASC) 5 MG tablet TAKE ONE TABLET BY MOUTH ONCE DAILY  . aspirin 81 MG tablet Take 81 mg by mouth daily.   . beta carotene w/minerals (OCUVITE) tablet Take 1 tablet by mouth daily.  . clopidogrel (PLAVIX) 75 MG tablet TAKE ONE TABLET BY MOUTH ONCE DAILY  . finasteride (PROSCAR) 5 MG tablet Take 5 mg by mouth daily.  . metoprolol tartrate (LOPRESSOR) 25 MG tablet TAKE 1 TABLET BY MOUTH TWICE DAILY  . metroNIDAZOLE (METROCREAM) 0.75 % cream Apply 1 application topically 2 (two) times daily as needed (skin).   . Multiple Vitamins-Minerals (CENTRUM SILVER PO) Take 1 capsule by mouth daily.   Marland Kitchen NITROSTAT 0.4 MG SL tablet DISSOLVE ONE TABLET UNDER THE TONGUE EVERY 5 MINUTES AS NEEDED FOR CHEST PAIN.  DO NOT EXCEED A TOTAL OF 3 DOSES IN 15 MINUTES WITHOUT  SEEK  . OVER THE COUNTER MEDICATION Take 1 capsule by mouth daily as needed (constipation). Stool softner  . phenylephrine (NEO-SYNEPHRINE) 0.25 % nasal spray 1 spray as needed.   . Simethicone (GAS-X PO) Take 1 capsule by mouth daily as needed (GAS).  Marland Kitchen simvastatin (ZOCOR) 40 MG tablet TAKE ONE-HALF TABLET BY MOUTH AT BEDTIME  .  TEMAZEPAM PO Take 15 mg by mouth 2 (two) times daily.      Allergies:   Cyclosporine; Maxzide [triamterene-hctz]; and Prednisone   Past Medical History:  Diagnosis Date  . Coronary artery disease    PCI RCA 2009  . Dyslipidemia   . Hypertension   . Scleritis, unspecified    treated with prednisone  . Seizure [facility])    felt due to cyclosporin in setting of electrolyte disorder  . Solitary kidney   . Symptomatic bradycardia    a. s/p STJ dual chamber pacemaker    Past Surgical History:  Procedure Laterality Date  . PACEMAKER INSERTION  2009   STJ dual chamber pacemaker implanted by Dr Olevia Perches for symptomatic bradycardia     Social History:  The patient  reports that he has never smoked. He has never used smokeless tobacco. He reports that he does not drink alcohol or use drugs.   Family History:  The patient's family history includes Arthritis/Rheumatoid in his mother.    ROS:  Please see the history of present illness. All other systems are reviewed and  Negative to the above problem except as noted.    PHYSICAL EXAM: VS:  BP (!) 160/90   Pulse  60   Ht 5' 10.5" (1.791 m)   Wt 180 lb 3.2 oz (81.7 kg)   BMI 25.49 kg/m   GEN: Well nourished, well developed, in no acute distress  HEENT: normal  Neck: no JVD, carotid bruits, or masses Cardiac: RRR; no murmurs, rubs, or gallops,tr edema  Respiratory:  clear to auscultation bilaterally, normal work of breathing GI: soft, nontender, nondistended, + BS  No hepatomegaly  MS: no deformity Moving all extremities   Skin: warm and dry, no rash Neuro:  Strength and sensation are intact Psych: euthymic mood, full affect   EKG:  EKG is ordered today.  Atrial paced 60 bpm     Lipid Panel    Component Value Date/Time   CHOL 135 06/29/2009 1230   TRIG 37.0 06/29/2009 1230   HDL 63.70 06/29/2009 1230   CHOLHDL 2 06/29/2009 1230   VLDL 7.4 06/29/2009 1230   LDLCALC 64 06/29/2009 1230      Wt Readings from Last 3  Encounters:  01/15/17 180 lb 3.2 oz (81.7 kg)  01/20/16 178 lb 6.4 oz (80.9 kg)  12/20/15 176 lb 6.4 oz (80 kg)      ASSESSMENT AND PLAN: 1  CAD  No symptoms of angina  2  HTN  BP has been running up    Would increase amlodipine to 5 bid  F/U in June  3  PVOD  Continue to follow  4  HL  Switch to lpitor 40 Stop simvisatin  F/U lipids this summer      Current medicines are reviewed at length with the patient today.  The patient does not have concerns regarding medicines.  Signed, Dorris Carnes, MD  01/15/2017 11:32 AM    [facility] [place], [place], [place]  [address] Phone: [phone]; Fax: [phone]

## 2017-01-17 DIAGNOSIS — D485 Neoplasm of uncertain behavior of skin: Secondary | ICD-10-CM | POA: Diagnosis not present

## 2017-01-17 DIAGNOSIS — D1801 Hemangioma of skin and subcutaneous tissue: Secondary | ICD-10-CM | POA: Diagnosis not present

## 2017-01-17 DIAGNOSIS — L821 Other seborrheic keratosis: Secondary | ICD-10-CM | POA: Diagnosis not present

## 2017-01-17 DIAGNOSIS — Z85828 Personal history of other malignant neoplasm of skin: Secondary | ICD-10-CM | POA: Diagnosis not present

## 2017-01-17 DIAGNOSIS — L905 Scar conditions and fibrosis of skin: Secondary | ICD-10-CM | POA: Diagnosis not present

## 2017-01-24 ENCOUNTER — Encounter: Payer: Self-pay | Admitting: Internal Medicine

## 2017-02-14 ENCOUNTER — Encounter: Payer: Self-pay | Admitting: Internal Medicine

## 2017-02-14 ENCOUNTER — Ambulatory Visit (INDEPENDENT_AMBULATORY_CARE_PROVIDER_SITE_OTHER): Payer: Medicare Other | Admitting: Internal Medicine

## 2017-02-14 VITALS — BP 136/74 | HR 63 | Ht 70.5 in | Wt 186.8 lb

## 2017-02-14 DIAGNOSIS — I495 Sick sinus syndrome: Secondary | ICD-10-CM | POA: Diagnosis not present

## 2017-02-14 DIAGNOSIS — I10 Essential (primary) hypertension: Secondary | ICD-10-CM

## 2017-02-14 DIAGNOSIS — Z950 Presence of cardiac pacemaker: Secondary | ICD-10-CM

## 2017-02-14 DIAGNOSIS — I2510 Atherosclerotic heart disease of native coronary artery without angina pectoris: Secondary | ICD-10-CM

## 2017-02-14 DIAGNOSIS — R001 Bradycardia, unspecified: Secondary | ICD-10-CM

## 2017-02-14 NOTE — Progress Notes (Signed)
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 male who presents today for routine electrophysiology followup.  Since last being seen in our clinic, the patient reports doing very well.  Today, he denies symptoms of palpitations, chest pain, shortness of breath,  lower extremity edema, dizziness, presyncope, or syncope.  The patient is otherwise without complaint today.   Past Medical History:  Diagnosis Date  . Coronary artery disease    PCI RCA 2009  . Dyslipidemia   . Hypertension   . Scleritis, unspecified    treated with prednisone  . Seizure [facility])    felt due to cyclosporin in setting of electrolyte disorder  . Solitary kidney   . Symptomatic bradycardia    a. s/p STJ dual chamber pacemaker   Past Surgical History:  Procedure Laterality Date  . PACEMAKER INSERTION  2009   STJ dual chamber pacemaker implanted by Dr Olevia Perches for symptomatic bradycardia    ROS- all systems are reviewed and negative except as per HPI above  Current Outpatient Prescriptions  Medication Sig Dispense Refill  . amLODipine (NORVASC) 5 MG tablet Take 1 tablet (5 mg total) by mouth 2 (two) times daily. 180 tablet 3  . aspirin 81 MG tablet Take 81 mg by mouth daily.     Marland Kitchen atorvastatin (LIPITOR) 40 MG tablet Take 1 tablet (40 mg total) by mouth daily. 90 tablet 3  . beta carotene w/minerals (OCUVITE) tablet Take 1 tablet by mouth daily.    . clopidogrel (PLAVIX) 75 MG tablet TAKE ONE TABLET BY MOUTH ONCE DAILY 90 tablet 2  . finasteride (PROSCAR) 5 MG tablet Take 5 mg by mouth daily.    . metoprolol tartrate (LOPRESSOR) 25 MG tablet TAKE 1 TABLET BY MOUTH TWICE DAILY 180 tablet 0  . metroNIDAZOLE (METROCREAM) 0.75 % cream Apply 1 application topically 2 (two) times daily as needed (skin).     . Multiple Vitamins-Minerals (CENTRUM SILVER PO) Take 1 capsule by mouth daily.     Marland Kitchen NITROSTAT 0.4 MG SL tablet DISSOLVE ONE TABLET UNDER THE TONGUE EVERY 5 MINUTES AS  NEEDED FOR CHEST PAIN.  DO NOT EXCEED A TOTAL OF 3 DOSES IN 15 MINUTES WITHOUT  SEEK 25 tablet 1  . OVER THE COUNTER MEDICATION Take 1 capsule by mouth daily as needed (constipation). Stool softner    . phenylephrine (NEO-SYNEPHRINE) 0.25 % nasal spray 1 spray as needed. Use as directed    . Simethicone (GAS-X PO) Take 1 capsule by mouth daily as needed (GAS).    Marland Kitchen TEMAZEPAM PO Take 15 mg by mouth 2 (two) times daily.      No current facility-administered medications for this visit.     Physical Exam: Vitals:   02/14/17 1005  BP: 136/74  Pulse: 63  SpO2: 95%  Weight: 186 lb 12.8 oz (84.7 kg)  Height: 5' 10.5" (1.791 m)    GEN- The patient is well appearing, alert and oriented x 3 today.   Head- normocephalic, atraumatic Eyes-  Sclera clear, conjunctiva pink Ears- hearing intact Oropharynx- clear Lungs- Clear to ausculation bilaterally, normal work of breathing Chest- pacemaker pocket is well healed Heart- Regular rate and rhythm, no murmurs, rubs or gallops, PMI not laterally displaced GI- soft, NT, ND, + BS Extremities- no clubbing, cyanosis, or edema Skin- diffuse ecchymosis on back of hands from antiplatelet therapy  Pacemaker interrogation- reviewed in detail today,  See PACEART report  Assessment and Plan:  1. Sinus  bradycardia Normal pacemaker function See Pace Art report No changes today  2. HTN Stable No change required today  3. CAD No angina Followed by Dr Harrington Challenger  4. Mode switches He has many atrial lead mode switches but longest episode is 1 minute.  Given unipolar atrial sensing, I am not convinced that this represents true arrhythmias.   Would not initiate anticoagulation at this time.  (This has been the case for several years).    Follow-up with device clinic in 6 months EF NP to see every eyar I will see when needed  Thompson Grayer MD, [facility] 02/14/2017 10:21 AM

## 2017-02-14 NOTE — Patient Instructions (Addendum)
Medication Instructions:  Your physician recommends that you continue on your current medications as directed. Please refer to the Current Medication list given to you today.   Labwork: None ordered   Testing/Procedures: None ordered   Follow-Up: Your physician recommends that you schedule a follow-up appointment in: 6 months with device clinic and 12 months with Chanetta Marshall, NP   Any Other Special Instructions Will Be Listed Below (If Applicable).     If you need a refill on your cardiac medications before your next appointment, please call your pharmacy.

## 2017-02-16 LAB — CUP PACEART INCLINIC DEVICE CHECK
Battery Impedance: 1500 Ohm
Battery Voltage: 2.78 V
Brady Statistic RA Percent Paced: 46 %
Brady Statistic RV Percent Paced: 2.8 %
Date Time Interrogation Session: 20180530140642
Implantable Lead Implant Date: 20091023
Implantable Lead Implant Date: 20091023
Implantable Lead Location: 753859
Implantable Lead Location: 753860
Implantable Pulse Generator Implant Date: 20091023
Lead Channel Impedance Value: 359 Ohm
Lead Channel Impedance Value: 418 Ohm
Lead Channel Pacing Threshold Amplitude: 0.75 V
Lead Channel Pacing Threshold Amplitude: 1 V
Lead Channel Pacing Threshold Pulse Width: 0.5 ms
Lead Channel Pacing Threshold Pulse Width: 0.5 ms
Lead Channel Sensing Intrinsic Amplitude: 1.7 mV
Lead Channel Sensing Intrinsic Amplitude: 12 mV
Lead Channel Setting Pacing Amplitude: 2 V
Lead Channel Setting Pacing Amplitude: 2.5 V
Lead Channel Setting Pacing Pulse Width: 0.5 ms
Lead Channel Setting Sensing Sensitivity: 2 mV
Pulse Gen Model: 5826
Pulse Gen Serial Number: 2231585

## 2017-03-15 ENCOUNTER — Encounter: Payer: Self-pay | Admitting: Internal Medicine

## 2017-03-15 ENCOUNTER — Ambulatory Visit (INDEPENDENT_AMBULATORY_CARE_PROVIDER_SITE_OTHER): Payer: Medicare Other | Admitting: Internal Medicine

## 2017-03-15 VITALS — BP 146/78 | HR 62 | Ht 70.5 in | Wt 185.1 lb

## 2017-03-15 DIAGNOSIS — I2510 Atherosclerotic heart disease of native coronary artery without angina pectoris: Secondary | ICD-10-CM

## 2017-03-15 DIAGNOSIS — I10 Essential (primary) hypertension: Secondary | ICD-10-CM | POA: Diagnosis not present

## 2017-03-15 DIAGNOSIS — I779 Disorder of arteries and arterioles, unspecified: Secondary | ICD-10-CM | POA: Diagnosis not present

## 2017-03-15 DIAGNOSIS — I739 Peripheral vascular disease, unspecified: Secondary | ICD-10-CM

## 2017-03-15 MED ORDER — CARVEDILOL 3.125 MG PO TABS: 3.1250 mg | ORAL_TABLET | Freq: Two times a day (BID) | ORAL | 6 refills | Status: DC

## 2017-03-15 MED ORDER — AMLODIPINE BESYLATE 5 MG PO TABS: 5.0000 mg | ORAL_TABLET | Freq: Every day | ORAL | 3 refills | Status: DC

## 2017-03-15 NOTE — Patient Instructions (Signed)
Your physician has recommended you make the following change in your medication:  1) STOP metoprolol 2.) START carvedilol (Coreg) 3.125 mg two times a day 3.) DECREASE amlodipine to 5 mg once a day  Your physician recommends that you schedule a follow-up appointment in: 3 weeks with the Hypertension Clinic Pharmacist.

## 2017-03-15 NOTE — Progress Notes (Signed)
Cardiology Office Note   Date:  03/15/2017   ID:  ENGELBERT SEVIN, DOB [birth year], MRN [id number]  PCP:  Shirline Frees, MD  Cardiologist:   Dorris Carnes, MD   F/U of CAD     History of Present Illness: [name] is a 83 y.o. male with a history of CADs/p rotation atherectomy and DES to the RCA in 10/09 (LHC at that time: prox to mid RCA 90-95% then 90% - tx with PCI; dRCA 40-60%, LAD 70-80% after D2, 40-50% at D1, oD2 60-70%, oCFX 40%, OM1 80%, AVCFX 30-40%). Last echo 10/09: EF 60-65%. Other hx includes bradycardia, s/p pacer, HTN, hyperlipidemia, seizure  D (s/p PCI to RCA 2009), HTN, HL  He has a history of 1 kidney   I saw the pt in April 2018  BP was elevate  I increased amlodipine to 5 bid  Switched to lipitor 40    BP at home 108 to 120s /60s to 70s  No dizziness  Breathing OK    Since Bamlodipine was increased pt has had increased LE edema     Current Meds  Medication Sig  . amLODipine (NORVASC) 5 MG tablet Take 1 tablet (5 mg total) by mouth 2 (two) times daily.  Marland Kitchen aspirin 81 MG tablet Take 81 mg by mouth daily.   Marland Kitchen atorvastatin (LIPITOR) 40 MG tablet Take 1 tablet (40 mg total) by mouth daily.  . beta carotene w/minerals (OCUVITE) tablet Take 1 tablet by mouth daily.  . clopidogrel (PLAVIX) 75 MG tablet TAKE ONE TABLET BY MOUTH ONCE DAILY  . finasteride (PROSCAR) 5 MG tablet Take 5 mg by mouth daily.  . metoprolol tartrate (LOPRESSOR) 25 MG tablet TAKE 1 TABLET BY MOUTH TWICE DAILY  . metroNIDAZOLE (METROCREAM) 0.75 % cream Apply 1 application topically 2 (two) times daily as needed (skin).   . Multiple Vitamins-Minerals (CENTRUM SILVER PO) Take 1 capsule by mouth daily.   Marland Kitchen NITROSTAT 0.4 MG SL tablet DISSOLVE ONE TABLET UNDER THE TONGUE EVERY 5 MINUTES AS NEEDED FOR CHEST PAIN.  DO NOT EXCEED A TOTAL OF 3 DOSES IN 15 MINUTES WITHOUT  SEEK  . OVER THE COUNTER MEDICATION Take 1 capsule by mouth daily as needed (constipation). Stool softner  . phenylephrine  (NEO-SYNEPHRINE) 0.25 % nasal spray 1 spray as needed. Use as directed  . Simethicone (GAS-X PO) Take 1 capsule by mouth daily as needed (GAS).  Marland Kitchen TEMAZEPAM PO Take 15 mg by mouth 2 (two) times daily.      Allergies:   Cyclosporine; Maxzide [triamterene-hctz]; and Prednisone   Past Medical History:  Diagnosis Date  . Coronary artery disease    PCI RCA 2009  . Dyslipidemia   . Hypertension   . Scleritis, unspecified    treated with prednisone  . Seizure [facility])    felt due to cyclosporin in setting of electrolyte disorder  . Solitary kidney   . Symptomatic bradycardia    a. s/p STJ dual chamber pacemaker    Past Surgical History:  Procedure Laterality Date  . PACEMAKER INSERTION  2009   STJ dual chamber pacemaker implanted by Dr Olevia Perches for symptomatic bradycardia     Social History:  The patient  reports that he has never smoked. He has never used smokeless tobacco. He reports that he does not drink alcohol or use drugs.   Family History:  The patient's family history includes Arthritis/Rheumatoid in his mother.    ROS:  Please see the history of  present illness. All other systems are reviewed and  Negative to the above problem except as noted.    PHYSICAL EXAM: VS:  BP (!) 146/78   Pulse 62   Ht 5' 10.5" (1.791 m)   Wt 84 kg (185 lb 1.9 oz)   BMI 26.19 kg/m   GEN: Well nourished, well developed, in no acute distress  HEENT: normal  Neck: no JVD, carotid bruits, or masses Cardiac: RRR; no murmurs, rubs, or gallops,1+ edema  Respiratory:  clear to auscultation bilaterally, normal work of breathing GI: soft, nontender, nondistended, + BS  No hepatomegaly  MS: no deformity Moving all extremities   Skin: warm and dry, no rash Neuro:  Strength and sensation are intact Psych: euthymic mood, full affect   EKG:  EKG is not  ordered today.   Lipid Panel    Component Value Date/Time   CHOL 135 06/29/2009 1230   TRIG 37.0 06/29/2009 1230   HDL 63.70 06/29/2009 1230     CHOLHDL 2 06/29/2009 1230   VLDL 7.4 06/29/2009 1230   LDLCALC 64 06/29/2009 1230      Wt Readings from Last 3 Encounters:  03/15/17 84 kg (185 lb 1.9 oz)  02/14/17 84.7 kg (186 lb 12.8 oz)  01/15/17 81.7 kg (180 lb 3.2 oz)      ASSESSMENT AND PLAN:  1  BP is Better  But LE eemia in sincreased  I have reviewed with pharmacy  Recomm stopping metoprolol  Starting Coreg for more potent BP control  Decrease amlodkipine t o5 mg per day F/U in HTN clinic in 3 wks    2  CKD   Will need t ofollow renal function    3  CAD  No symptoms to sugg angina  4  L  Continue statin    Current medicines are reviewed at length with the patient today.  The patient does not have concerns regarding medicines.  Signed, Dorris Carnes, MD  03/15/2017 9:36 AM    [address], [place], [place]  [address] Phone: [phone]; Fax: [phone]

## 2017-04-03 ENCOUNTER — Ambulatory Visit (INDEPENDENT_AMBULATORY_CARE_PROVIDER_SITE_OTHER): Payer: Medicare Other | Admitting: Pharmacist Clinician (PhC)/ Clinical Pharmacy Specialist

## 2017-04-03 DIAGNOSIS — I2510 Atherosclerotic heart disease of native coronary artery without angina pectoris: Secondary | ICD-10-CM

## 2017-04-03 DIAGNOSIS — I10 Essential (primary) hypertension: Secondary | ICD-10-CM

## 2017-04-03 NOTE — Assessment & Plan Note (Signed)
Patient with apparent white coat hypertension.  Home readings average 129/72.  Currently tolerating carvedilol 3.125 bid and amlodipine 5 mg qd without problem.   Will have him continue with his current regimen and regular home BP checks.  He will call the office should he notice readings trending to < 051 systolic or has any other concerns.

## 2017-04-03 NOTE — Progress Notes (Signed)
04/03/2017 [name] [birth year] [id number]   HPI:  [name] is a 83 y.o. male patient of Dr Harrington Challenger, with a PMH below who presents today for hypertension clinic evaluation.  His medical history is significant for CAD, bradycardia, hypertension, hyperlipidemia, and seizure disorder.  He also has just 1 kidney.  Most recent SCr (12/2016) was 1.11, with Na at 140 and K at 4.0  When he saw Dr. Harrington Challenger in June he was noted to have lower extremity edema, therefore the amlodipine was reduced from 10 mg daily to 5 mg.   He was also switched from metoprolol to carvedilol.  He reports no concerns with the switch to carvedilol.  Swelling in ankles has dissipated. He has no complaints today and indications no dizziness with sudden positional changes.  He does admit to sitting on edge of bed to get his bearings before getting up.   Blood Pressure Goal:  130/80   Current Medications:  Carvedilol 3.125 mg bid  Amlodipine 5 mg qd  Family Hx:  Mother died at 59 from suspected MI, Brother died at 7 from MI; sister with CABG lived to 49   Social Hx:  No tobacco, no alcohol, 1 coffee day then just water and occasional ginger ale  Diet:  Patient is widowed and eats out regularly, no added salt although he knows restaurant food is high in sodium, avoids fatty foods; likes [employer], grilled chicken  Exercise:  Walk on treadmill 2-3 days per week, about an hour (2-2.5 miles); also occasionally will walk the mall  Home BP readings:  Took 18 readings (has 2 home cuffs and readings between the 2 were always within 10 points of each other); did bring one cuff to the office several months ago and it read slightly higher than office cuff.  Patient notes his pressure is usually 20-30 points higher in the office.  Intolerances:   Amlodipine 10 mg caused ankle edema  Wt Readings from Last 3 Encounters:  04/03/17 184 lb 4 oz (83.6 kg)  03/15/17 185 lb 1.9 oz (84 kg)  02/14/17 186 lb 12.8 oz (84.7 kg)   BP  Readings from Last 3 Encounters:  04/03/17 (!) 156/72  03/15/17 (!) 146/78  02/14/17 136/74   Pulse Readings from Last 3 Encounters:  04/03/17 (!) 56  03/15/17 62  02/14/17 63    Current Outpatient Prescriptions  Medication Sig Dispense Refill  . amLODipine (NORVASC) 5 MG tablet Take 1 tablet (5 mg total) by mouth daily. 180 tablet 3  . aspirin 81 MG tablet Take 81 mg by mouth daily.     Marland Kitchen atorvastatin (LIPITOR) 40 MG tablet Take 1 tablet (40 mg total) by mouth daily. 90 tablet 3  . beta carotene w/minerals (OCUVITE) tablet Take 1 tablet by mouth daily.    . carvedilol (COREG) 3.125 MG tablet Take 1 tablet (3.125 mg total) by mouth 2 (two) times daily. 60 tablet 6  . clopidogrel (PLAVIX) 75 MG tablet TAKE ONE TABLET BY MOUTH ONCE DAILY 90 tablet 2  . finasteride (PROSCAR) 5 MG tablet Take 5 mg by mouth daily.    . metroNIDAZOLE (METROCREAM) 0.75 % cream Apply 1 application topically 2 (two) times daily as needed (skin).     . Multiple Vitamins-Minerals (CENTRUM SILVER PO) Take 1 capsule by mouth daily.     Marland Kitchen NITROSTAT 0.4 MG SL tablet DISSOLVE ONE TABLET UNDER THE TONGUE EVERY 5 MINUTES AS NEEDED FOR CHEST PAIN.  DO NOT EXCEED A  TOTAL OF 3 DOSES IN 15 MINUTES WITHOUT  SEEK 25 tablet 1  . OVER THE COUNTER MEDICATION Take 1 capsule by mouth daily as needed (constipation). Stool softner    . phenylephrine (NEO-SYNEPHRINE) 0.25 % nasal spray 1 spray as needed. Use as directed    . Simethicone (GAS-X PO) Take 1 capsule by mouth daily as needed (GAS).    Marland Kitchen TEMAZEPAM PO Take 15 mg by mouth 2 (two) times daily.      No current facility-administered medications for this visit.     Allergies  Allergen Reactions  . Cyclosporine     unknown  . Maxzide [Triamterene-Hctz] Other (See Comments)    Adverse reaction-bloating and abdominal pain  . Prednisone Other (See Comments)    Can tolerate for a short period of time Unknown reaction    Past Medical History:  Diagnosis Date  . Coronary  artery disease    PCI RCA 2009  . Dyslipidemia   . Hypertension   . Scleritis, unspecified    treated with prednisone  . Seizure [facility])    felt due to cyclosporin in setting of electrolyte disorder  . Solitary kidney   . Symptomatic bradycardia    a. s/p STJ dual chamber pacemaker    Blood pressure (!) 156/72, pulse (!) 56, weight 184 lb 4 oz (83.6 kg).  HYPERTENSION, BENIGN Patient with apparent white coat hypertension.  Home readings average 129/72.  Currently tolerating carvedilol 3.125 bid and amlodipine 5 mg qd without problem.   Will have him continue with his current regimen and regular home BP checks.  He will call the office should he notice readings trending to < 122 systolic or has any other concerns.     Tommy Medal PharmD CPP [facility]

## 2017-04-03 NOTE — Patient Instructions (Signed)
  Your blood pressure today is 156/72  (goal is < 130/80)   Check your blood pressure at home several days each week and keep record of the readings.  Take your BP meds as follows:  Continue with current medications  Bring all of your meds, your BP cuff and your record of home blood pressures to your next appointment.  Exercise as you're able, try to walk approximately 30 minutes per day.  Keep salt intake to a minimum, especially watch canned and prepared boxed foods.  Eat more fresh fruits and vegetables and fewer canned items.  Avoid eating in fast food restaurants.    HOW TO TAKE YOUR BLOOD PRESSURE: . Rest 5 minutes before taking your blood pressure. .  Don't smoke or drink caffeinated beverages for at least 30 minutes before. . Take your blood pressure before (not after) you eat. . Sit comfortably with your back supported and both feet on the floor (don't cross your legs). . Elevate your arm to heart level on a table or a desk. . Use the proper sized cuff. It should fit smoothly and snugly around your bare upper arm. There should be enough room to slip a fingertip under the cuff. The bottom edge of the cuff should be 1 inch above the crease of the elbow. . Ideally, take 3 measurements at one sitting and record the average.

## 2017-06-05 DIAGNOSIS — Z23 Encounter for immunization: Secondary | ICD-10-CM | POA: Diagnosis not present

## 2017-07-04 ENCOUNTER — Other Ambulatory Visit: Payer: Self-pay

## 2017-07-04 MED ORDER — NITROSTAT 0.4 MG SL SUBL: SUBLINGUAL_TABLET | SUBLINGUAL | 0 refills | Status: DC

## 2017-07-06 MED ORDER — NITROSTAT 0.4 MG SL SUBL: SUBLINGUAL_TABLET | SUBLINGUAL | 5 refills | Status: DC

## 2017-07-06 NOTE — Addendum Note (Signed)
Addended by: Derl Barrow on: 07/06/2017 08:28 AM   Modules accepted: Orders

## 2017-07-09 DIAGNOSIS — H1789 Other corneal scars and opacities: Secondary | ICD-10-CM | POA: Diagnosis not present

## 2017-07-09 DIAGNOSIS — Z961 Presence of intraocular lens: Secondary | ICD-10-CM | POA: Diagnosis not present

## 2017-07-12 DIAGNOSIS — I10 Essential (primary) hypertension: Secondary | ICD-10-CM | POA: Diagnosis not present

## 2017-07-12 DIAGNOSIS — N400 Enlarged prostate without lower urinary tract symptoms: Secondary | ICD-10-CM | POA: Diagnosis not present

## 2017-07-12 DIAGNOSIS — E7800 Pure hypercholesterolemia, unspecified: Secondary | ICD-10-CM | POA: Diagnosis not present

## 2017-07-12 DIAGNOSIS — G4700 Insomnia, unspecified: Secondary | ICD-10-CM | POA: Diagnosis not present

## 2017-07-12 DIAGNOSIS — L719 Rosacea, unspecified: Secondary | ICD-10-CM | POA: Diagnosis not present

## 2017-08-20 ENCOUNTER — Ambulatory Visit (INDEPENDENT_AMBULATORY_CARE_PROVIDER_SITE_OTHER): Payer: Medicare Other | Admitting: *Deleted

## 2017-08-20 DIAGNOSIS — I495 Sick sinus syndrome: Secondary | ICD-10-CM | POA: Diagnosis not present

## 2017-08-20 DIAGNOSIS — R001 Bradycardia, unspecified: Secondary | ICD-10-CM

## 2017-08-20 LAB — CUP PACEART INCLINIC DEVICE CHECK
Battery Impedance: 1700 Ohm
Battery Voltage: 2.78 V
Brady Statistic RA Percent Paced: 39 %
Brady Statistic RV Percent Paced: 2.3 %
Date Time Interrogation Session: 20181203102514
Implantable Lead Implant Date: 20091023
Implantable Lead Implant Date: 20091023
Implantable Lead Location: 753859
Implantable Lead Location: 753860
Implantable Pulse Generator Implant Date: 20091023
Lead Channel Impedance Value: 331 Ohm
Lead Channel Impedance Value: 410 Ohm
Lead Channel Pacing Threshold Amplitude: 1 V
Lead Channel Pacing Threshold Amplitude: 1.25 V
Lead Channel Pacing Threshold Pulse Width: 0.5 ms
Lead Channel Pacing Threshold Pulse Width: 0.5 ms
Lead Channel Sensing Intrinsic Amplitude: 1.7 mV
Lead Channel Sensing Intrinsic Amplitude: 12 mV
Lead Channel Setting Pacing Amplitude: 2 V
Lead Channel Setting Pacing Amplitude: 2.5 V
Lead Channel Setting Pacing Pulse Width: 0.5 ms
Lead Channel Setting Sensing Sensitivity: 2 mV
Pulse Gen Model: 5826
Pulse Gen Serial Number: 2231585

## 2017-08-20 NOTE — Progress Notes (Signed)
Pacemaker check in clinic. Normal device function. Thresholds, sensing, impedances consistent with previous measurements. Device programmed to maximize longevity. 3.9% mode switched- previously noted to be RA lead noise, all episodes < 1 min. No high ventricular rates noted. Device programmed at appropriate safety margins. Histogram distribution appropriate for patient activity level. Device programmed to optimize intrinsic conduction. Estimated longevity 3.5-7 years. ROV with AS in May. [name] c/o LE edema and HTN x 1 month. BP managed by Dr. Harrington Challenger. Staff message sent to Rodman Key for f/u. She is aware.

## 2017-09-05 ENCOUNTER — Other Ambulatory Visit: Payer: Self-pay | Admitting: Internal Medicine

## 2017-09-07 ENCOUNTER — Telehealth: Payer: Self-pay | Admitting: *Deleted

## 2017-09-07 NOTE — Telephone Encounter (Signed)
Called patient.  Left message for him to call back.  He can be added to Dr. Alan Ripper scheduled 09/26/17 for BP concerns. See below:   I actually saw patient in hallway and discussed.  He has lower extremity swelling. (on amlodipine)  Reported the higher BPs.  Was seen in HTN clinic in July and was instructed to call with SBP >140.  You saw him in April and rec return in 1 year.  Since he's leaving for [place] for 2 mo--do you want to try to add him in? Otherwise I can get him in HTN clinic this week.   Previous Messages    ----- Message -----  From: Ranee Gosselin, RN  Sent: 08/20/2017 10:09 AM  To: Rodman Key, RN   Irene Limbo,  Mr. Sturtevant BP has been elevated (485-462 systolic) x 1 month. He had BP med adjustment with Dr. Harrington Challenger in June and followed up with HTN Clinic 3 weeks after that. This HTN has also been correlated with LE edema. He would like to be contacted regarding medication adjustment or f/u with Dr. Harrington Challenger. I'm not sure when she wanted to see him back. He will be away in [facility] Jan and Feb.   Thanks,  Terrence Dupont

## 2017-09-10 NOTE — Telephone Encounter (Signed)
Follow up     Pt is going to [facility] , if he has anymore problems with swelling he will call you , everything is working fine right now.  His bp is good and the swelling went away

## 2017-11-09 ENCOUNTER — Other Ambulatory Visit: Payer: Self-pay | Admitting: Internal Medicine

## 2017-11-19 ENCOUNTER — Other Ambulatory Visit: Payer: Self-pay | Admitting: Internal Medicine

## 2018-01-03 ENCOUNTER — Other Ambulatory Visit: Payer: Self-pay | Admitting: Internal Medicine

## 2018-01-09 ENCOUNTER — Other Ambulatory Visit: Payer: Self-pay | Admitting: Internal Medicine

## 2018-01-09 DIAGNOSIS — I6523 Occlusion and stenosis of bilateral carotid arteries: Secondary | ICD-10-CM

## 2018-01-14 ENCOUNTER — Ambulatory Visit (HOSPITAL_COMMUNITY)
Admission: RE | Admit: 2018-01-14 | Discharge: 2018-01-14 | Disposition: A | Discharge status: Home / Self Care | Payer: Medicare Other | Source: Ambulatory Visit | Attending: Cardiovascular Disease | Admitting: Cardiovascular Disease

## 2018-01-14 DIAGNOSIS — I10 Essential (primary) hypertension: Secondary | ICD-10-CM | POA: Diagnosis not present

## 2018-01-14 DIAGNOSIS — E785 Hyperlipidemia, unspecified: Secondary | ICD-10-CM | POA: Insufficient documentation

## 2018-01-14 DIAGNOSIS — I6523 Occlusion and stenosis of bilateral carotid arteries: Secondary | ICD-10-CM | POA: Diagnosis not present

## 2018-01-14 DIAGNOSIS — I2510 Atherosclerotic heart disease of native coronary artery without angina pectoris: Secondary | ICD-10-CM | POA: Diagnosis not present

## 2018-01-16 ENCOUNTER — Telehealth: Payer: Self-pay | Admitting: Internal Medicine

## 2018-01-16 NOTE — Telephone Encounter (Signed)
Pt is calling and stated he is returning call from nurse for results of his carotid test.

## 2018-01-16 NOTE — Telephone Encounter (Signed)
Notes recorded by Fay Records, MD on 01/15/2018 at 1:03 AM EDT Stable carotid artery plaquing  F/U Ultrasound in 1 year  Reviewed with patient who states understanding.

## 2018-01-17 ENCOUNTER — Other Ambulatory Visit: Payer: Self-pay | Admitting: *Deleted

## 2018-01-17 DIAGNOSIS — D1801 Hemangioma of skin and subcutaneous tissue: Secondary | ICD-10-CM | POA: Diagnosis not present

## 2018-01-17 DIAGNOSIS — D171 Benign lipomatous neoplasm of skin and subcutaneous tissue of trunk: Secondary | ICD-10-CM | POA: Diagnosis not present

## 2018-01-17 DIAGNOSIS — L820 Inflamed seborrheic keratosis: Secondary | ICD-10-CM | POA: Diagnosis not present

## 2018-01-17 DIAGNOSIS — L218 Other seborrheic dermatitis: Secondary | ICD-10-CM | POA: Diagnosis not present

## 2018-01-17 DIAGNOSIS — L570 Actinic keratosis: Secondary | ICD-10-CM | POA: Diagnosis not present

## 2018-01-17 DIAGNOSIS — I6523 Occlusion and stenosis of bilateral carotid arteries: Secondary | ICD-10-CM

## 2018-01-17 DIAGNOSIS — Z85828 Personal history of other malignant neoplasm of skin: Secondary | ICD-10-CM | POA: Diagnosis not present

## 2018-01-17 DIAGNOSIS — L821 Other seborrheic keratosis: Secondary | ICD-10-CM | POA: Diagnosis not present

## 2018-01-17 DIAGNOSIS — D692 Other nonthrombocytopenic purpura: Secondary | ICD-10-CM | POA: Diagnosis not present

## 2018-01-17 DIAGNOSIS — D225 Melanocytic nevi of trunk: Secondary | ICD-10-CM | POA: Diagnosis not present

## 2018-01-17 NOTE — Progress Notes (Signed)
Notes recorded by Fay Records, MD on 01/15/2018 at 1:03 AM EDT Stable carotid artery plaquing  F/U Ultrasound in 1 year

## 2018-02-05 DIAGNOSIS — I10 Essential (primary) hypertension: Secondary | ICD-10-CM | POA: Diagnosis not present

## 2018-02-05 DIAGNOSIS — G4700 Insomnia, unspecified: Secondary | ICD-10-CM | POA: Diagnosis not present

## 2018-02-05 DIAGNOSIS — N400 Enlarged prostate without lower urinary tract symptoms: Secondary | ICD-10-CM | POA: Diagnosis not present

## 2018-02-05 DIAGNOSIS — E7800 Pure hypercholesterolemia, unspecified: Secondary | ICD-10-CM | POA: Diagnosis not present

## 2018-02-05 DIAGNOSIS — L719 Rosacea, unspecified: Secondary | ICD-10-CM | POA: Diagnosis not present

## 2018-02-13 ENCOUNTER — Other Ambulatory Visit: Payer: Self-pay | Admitting: Internal Medicine

## 2018-02-18 ENCOUNTER — Ambulatory Visit (INDEPENDENT_AMBULATORY_CARE_PROVIDER_SITE_OTHER): Payer: Medicare Other | Admitting: Internal Medicine

## 2018-02-18 ENCOUNTER — Encounter: Payer: Self-pay | Admitting: Internal Medicine

## 2018-02-18 VITALS — BP 144/79 | HR 67 | Ht 70.5 in | Wt 188.8 lb

## 2018-02-18 DIAGNOSIS — E782 Mixed hyperlipidemia: Secondary | ICD-10-CM | POA: Diagnosis not present

## 2018-02-18 DIAGNOSIS — I6523 Occlusion and stenosis of bilateral carotid arteries: Secondary | ICD-10-CM | POA: Diagnosis not present

## 2018-02-18 DIAGNOSIS — I10 Essential (primary) hypertension: Secondary | ICD-10-CM

## 2018-02-18 DIAGNOSIS — I2510 Atherosclerotic heart disease of native coronary artery without angina pectoris: Secondary | ICD-10-CM | POA: Diagnosis not present

## 2018-02-18 MED ORDER — SPIRONOLACTONE 25 MG PO TABS: 12.5000 mg | ORAL_TABLET | Freq: Every day | ORAL | 3 refills | Status: DC

## 2018-02-18 NOTE — Progress Notes (Signed)
Cardiology Office Note   Date:  02/18/2018   ID:  [name], DOB 1931-03-31, MRN [id number]  PCP:  Shirline Frees, MD  Cardiologist:   Dorris Carnes, MD   F/U of CAD and HTN      History of Present Illness: [name] is a 84 y.o. male with a history of CADs/p rotation atherectomy and DES to the RCA in 10/09 (LHC at that time: prox to mid RCA 90-95% then 90% - tx with PCI; dRCA 40-60%, LAD 70-80% after D2, 40-50% at D1, oD2 60-70%, oCFX 40%, OM1 80%, AVCFX 30-40%). Last echo 10/09: EF 60-65%. Other hx includes bradycardia, s/p pacer, HTN, hyperlipidemia, seizure  D (s/p PCI to RCA 2009), HTN, HL  He has a history of 1 kidney  In April 2018 I increased amlodipine for BP    WHen I sawa him in June he had edeam   I recomm decreasing and switching b blocker to coreg He was seen after that in HTN clnic   In clinic said he had white coat HTN   BP was bette at home   BP was 156/ at that time    He returns today for f/u   BP at Dr Kenton Kingfisher' office 170/     Pt admits that his BP has been up at times   Up and down     He denies CP  Breathing is OK   No dizziness   Current Meds  Medication Sig  . amLODipine (NORVASC) 5 MG tablet Take 1 tablet (5 mg total) by mouth daily.  Marland Kitchen aspirin 81 MG tablet Take 81 mg by mouth daily.   Marland Kitchen atorvastatin (LIPITOR) 40 MG tablet Take 1 tablet (40 mg total) by mouth daily. Please keep upcoming appt with Dr. Harrington Challenger. Thank You  . beta carotene w/minerals (OCUVITE) tablet Take 1 tablet by mouth daily.  . carvedilol (COREG) 3.125 MG tablet Take 1 tablet (3.125 mg total) by mouth 2 (two) times daily.  . clopidogrel (PLAVIX) 75 MG tablet TAKE 1 TABLET BY MOUTH ONCE DAILY  . finasteride (PROSCAR) 5 MG tablet Take 5 mg by mouth daily.  . metroNIDAZOLE (METROCREAM) 0.75 % cream Apply 1 application topically 2 (two) times daily as needed (skin).   . Multiple Vitamins-Minerals (CENTRUM SILVER PO) Take 1 capsule by mouth daily.   Marland Kitchen NITROSTAT 0.4 MG SL tablet  DISSOLVE ONE TABLET UNDER THE TONGUE EVERY 5 MINUTES AS NEEDED FOR CHEST PAIN.  DO NOT EXCEED A TOTAL OF 3 DOSES IN 15 MINUTES  . OVER THE COUNTER MEDICATION Take 1 capsule by mouth daily as needed (constipation). Stool softner  . phenylephrine (NEO-SYNEPHRINE) 0.25 % nasal spray 1 spray as needed. Use as directed  . Simethicone (GAS-X PO) Take 1 capsule by mouth daily as needed (GAS).  Marland Kitchen TEMAZEPAM PO Take 15 mg by mouth 2 (two) times daily.      Allergies:   Cyclosporine; Maxzide [triamterene-hctz]; and Prednisone   Past Medical History:  Diagnosis Date  . Coronary artery disease    PCI RCA 2009  . Dyslipidemia   . Hypertension   . Scleritis, unspecified    treated with prednisone  . Seizure [facility])    felt due to cyclosporin in setting of electrolyte disorder  . Solitary kidney   . Symptomatic bradycardia    a. s/p STJ dual chamber pacemaker    Past Surgical History:  Procedure Laterality Date  . PACEMAKER INSERTION  2009   STJ  dual chamber pacemaker implanted by Dr Olevia Perches for symptomatic bradycardia     Social History:  The patient  reports that he has never smoked. He has never used smokeless tobacco. He reports that he does not drink alcohol or use drugs.   Family History:  The patient's family history includes Arthritis/Rheumatoid in his mother.    ROS:  Please see the history of present illness. All other systems are reviewed and  Negative to the above problem except as noted.    PHYSICAL EXAM: VS:  BP (!) 144/79   Pulse 67   Ht 5' 10.5" (1.791 m)   Wt 85.6 kg (188 lb 12.8 oz)   BMI 26.71 kg/m   GEN: Well nourished, well developed, in no acute distress  HEENT: normal  Neck:   JVP is normal  No carotid bruits, or masses Cardiac: RRR; no murmurs, rubs, or gallops,1+ edema  Respiratory:  clear to auscultation bilaterally, normal work of breathing GI: soft, nontender, nondistended, + BS  No hepatomegaly  MS: no deformity Moving all extremities   Skin: warm and  dry, no rash Neuro:  Strength and sensation are intact Psych: euthymic mood, full affect   EKG:  EKG is  ordered today.  SR 67 bpm     Lipid Panel    Component Value Date/Time   CHOL 135 06/29/2009 1230   TRIG 37.0 06/29/2009 1230   HDL 63.70 06/29/2009 1230   CHOLHDL 2 06/29/2009 1230   VLDL 7.4 06/29/2009 1230   LDLCALC 64 06/29/2009 1230      Wt Readings from Last 3 Encounters:  02/18/18 85.6 kg (188 lb 12.8 oz)  04/03/17 83.6 kg (184 lb 4 oz)  03/15/17 84 kg (185 lb 1.9 oz)      ASSESSMENT AND PLAN:  1 HTN   BP is labile   He admits to this outside  Will continue meds   Add 12.5 mg aldactone   FOllow  Get labs from Dr Kenton Kingfisher' office and then labs here in 7 dayss   F/U in clnic in 4 wks     2  CKD  BMET in 1 wk   3  CAD  No symptoms to sugg angina  4  L  Continue statin  :ast ;o[ods :D: 43  JD: 15  5  CV dz  Mildcarotid plaquing    Current medicines are reviewed at length with the patient today.  The patient does not have concerns regarding medicines.  Signed, Dorris Carnes, MD  02/18/2018 12:10 PM    [facility] [address], [place], [place]  [address] Phone: [phone]; Fax: [phone]

## 2018-02-18 NOTE — Patient Instructions (Addendum)
Your physician has recommended you make the following change in your medication:  1.) start aldactone 12.5 mg once a day (break 25 mg tablet in 1/2)  Your physician recommends that you return for lab work in: 1 week -Tuesday June 11  Your physician recommends that you schedule a follow-up appointment in: 4-6 weeks---we will call you with this appointment with the nurse.  Bring a list of blood pressure readings with you.  Addendum: left voice mail at Dr. Kenton Kingfisher' office (medical records) for labs (12/2017) to be faxed to Dr. Harrington Challenger at [phone].

## 2018-02-26 ENCOUNTER — Other Ambulatory Visit: Payer: Medicare Other

## 2018-02-26 DIAGNOSIS — I10 Essential (primary) hypertension: Secondary | ICD-10-CM

## 2018-02-26 LAB — BASIC METABOLIC PANEL
BUN/Creatinine Ratio: 17 (ref 10–24)
BUN: 18 mg/dL (ref 8–27)
CO2: 25 mmol/L (ref 20–29)
Calcium: 9.3 mg/dL (ref 8.6–10.2)
Chloride: 98 mmol/L (ref 96–106)
Creatinine, Ser: 1.06 mg/dL (ref 0.76–1.27)
GFR calc Af Amer: 74 mL/min/{1.73_m2} (ref >59)
GFR calc non Af Amer: 64 mL/min/{1.73_m2} (ref >59)
Glucose: 81 mg/dL (ref 65–99)
Potassium: 4.8 mmol/L (ref 3.5–5.2)
Sodium: 137 mmol/L (ref 134–144)

## 2018-02-28 DIAGNOSIS — L03116 Cellulitis of left lower limb: Secondary | ICD-10-CM | POA: Diagnosis not present

## 2018-03-05 DIAGNOSIS — L03116 Cellulitis of left lower limb: Secondary | ICD-10-CM | POA: Diagnosis not present

## 2018-03-19 NOTE — Progress Notes (Signed)
Electrophysiology Office Note Date: 03/20/2018  ID:  [name], DOB [birth year], MRN [id number]  PCP: Shirline Frees, MD Primary Cardiologist: Harrington Challenger Electrophysiologist: Allred  CC: Pacemaker follow-up  [name] is a 84 y.o. male seen today for Dr Rayann Heman.  He presents today for routine electrophysiology followup.  Since last being seen in our clinic, the patient reports doing relatively well.  He has recently developed cellulitis of the left leg after hitting it on a kitchen table and has just finished antibiotics. He is weak and does not have the energy that he previously did.  He continues with chills at night.   He denies chest pain, palpitations, dyspnea, PND, orthopnea, nausea, vomiting, dizziness, syncope, weight gain, or early satiety.  Device History: STJ dual chamber PPM implanted 2009 for symptomatic bradycardia   Past Medical History:  Diagnosis Date  . Coronary artery disease    PCI RCA 2009  . Dyslipidemia   . Hypertension   . Scleritis, unspecified    treated with prednisone  . Seizure [facility])    felt due to cyclosporin in setting of electrolyte disorder  . Solitary kidney   . Symptomatic bradycardia    a. s/p STJ dual chamber pacemaker   Past Surgical History:  Procedure Laterality Date  . PACEMAKER INSERTION  2009   STJ dual chamber pacemaker implanted by Dr Olevia Perches for symptomatic bradycardia    Current Outpatient Medications  Medication Sig Dispense Refill  . amLODipine (NORVASC) 5 MG tablet Take 1 tablet (5 mg total) by mouth daily. 180 tablet 3  . aspirin 81 MG tablet Take 81 mg by mouth daily.     Marland Kitchen atorvastatin (LIPITOR) 40 MG tablet Take 1 tablet (40 mg total) by mouth daily. Please keep upcoming appt with Dr. Harrington Challenger. Thank You 90 tablet 0  . beta carotene w/minerals (OCUVITE) tablet Take 1 tablet by mouth daily.    . carvedilol (COREG) 3.125 MG tablet Take 1 tablet (3.125 mg total) by mouth 2 (two) times daily. 180 tablet 0  . clopidogrel  (PLAVIX) 75 MG tablet TAKE 1 TABLET BY MOUTH ONCE DAILY 90 tablet 0  . finasteride (PROSCAR) 5 MG tablet Take 5 mg by mouth daily.    . metroNIDAZOLE (METROCREAM) 0.75 % cream Apply 1 application topically 2 (two) times daily as needed (skin).     . Multiple Vitamins-Minerals (CENTRUM SILVER PO) Take 1 capsule by mouth daily.     Marland Kitchen NITROSTAT 0.4 MG SL tablet DISSOLVE ONE TABLET UNDER THE TONGUE EVERY 5 MINUTES AS NEEDED FOR CHEST PAIN.  DO NOT EXCEED A TOTAL OF 3 DOSES IN 15 MINUTES 25 tablet 5  . OVER THE COUNTER MEDICATION Take 1 capsule by mouth daily as needed (constipation). Stool softner    . phenylephrine (NEO-SYNEPHRINE) 0.25 % nasal spray 1 spray as needed. Use as directed    . Simethicone (GAS-X PO) Take 1 capsule by mouth daily as needed (GAS).    Marland Kitchen spironolactone (ALDACTONE) 25 MG tablet Take 0.5 tablets (12.5 mg total) by mouth daily. 45 tablet 3  . TEMAZEPAM PO Take 15 mg by mouth 2 (two) times daily.      No current facility-administered medications for this visit.     Allergies:   Cyclosporine; Maxzide [triamterene-hctz]; Sulfur; and Prednisone   Social History: Social History   Socioeconomic History  . Marital status: Widowed    Spouse name: Not on file  . Number of children: 2  . Years of education: college  .  Highest education level: Not on file  Occupational History  . Occupation: retired  Scientific laboratory technician  . Financial resource strain: Not on file  . Food insecurity:    Worry: Not on file    Inability: Not on file  . Transportation needs:    Medical: Not on file    Non-medical: Not on file  Tobacco Use  . Smoking status: Never Smoker  . Smokeless tobacco: Never Used  Substance and Sexual Activity  . Alcohol use: No  . Drug use: No  . Sexual activity: Not on file  Lifestyle  . Physical activity:    Days per week: Not on file    Minutes per session: Not on file  . Stress: Not on file  Relationships  . Social connections:    Talks on phone: Not on file     Gets together: Not on file    Attends religious service: Not on file    Active member of club or organization: Not on file    Attends meetings of clubs or organizations: Not on file    Relationship status: Not on file  . Intimate partner violence:    Fear of current or ex partner: Not on file    Emotionally abused: Not on file    Physically abused: Not on file    Forced sexual activity: Not on file  Other Topics Concern  . Not on file  Social History Narrative   Married   Tobacco   etoh    Family History: Family History  Problem Relation Age of Onset  . Arthritis/Rheumatoid Mother      Review of Systems: All other systems reviewed and are otherwise negative except as noted above.   Physical Exam: VS:  BP 102/64   Pulse 70   Ht 5' 10.5" (1.791 m)   Wt 185 lb 4 oz (84 kg)   BMI 26.20 kg/m  , BMI Body mass index is 26.2 kg/m.  GEN- The patient is elderly appearing, alert and oriented x 3 today.   HEENT: normocephalic, atraumatic; sclera clear, conjunctiva pink; hearing intact; oropharynx clear; neck supple  Lungs- Clear to ausculation bilaterally, normal work of breathing.  No wheezes, rales, rhonchi Heart- Regular rate and rhythm  GI- soft, non-tender, non-distended, bowel sounds present  Extremities- no clubbing, cyanosis, or edema  MS- no significant deformity or atrophy Skin- warm and dry, no rash or lesion; PPM pocket well healed, left leg with mild edema, +erythema Psych- euthymic mood, full affect Neuro- strength and sensation are intact  PPM Interrogation- reviewed in detail today,  See PACEART report  EKG:  EKG is not ordered today.  Recent Labs: 02/26/2018: BUN 18; Creatinine, Ser 1.06; Potassium 4.8; Sodium 137   Wt Readings from Last 3 Encounters:  03/20/18 185 lb 4 oz (84 kg)  02/18/18 188 lb 12.8 oz (85.6 kg)  04/03/17 184 lb 4 oz (83.6 kg)     Other studies Reviewed: Additional studies/ records that were reviewed today include: Dr Jackalyn Lombard  office notes   Assessment and Plan:  1.  Symptomatic bradycardia Normal PPM function See Pace Art report No changes today  2.  HTN Stable No change required today  3.  CAD No recent ischemic symptoms Continue medical therapy  4.  Mode switches Likely not true AF He has had short mode switch episodes for several years in the setting of unipolar sensing Will continue to follow  5.  Left leg cellulitis The patient states leg is 90% better  since completing antibiotics He continues with occasional chills I have asked him to follow up with PCP  Current medicines are reviewed at length with the patient today.   The patient does not have concerns regarding his medicines.  The following changes were made today:  none  Labs/ tests ordered today include: none No orders of the defined types were placed in this encounter.    Disposition:   Follow up with Dr Harrington Challenger as scheduled, device clinic 6 months, me every year    Signed, Chanetta Marshall, NP 03/20/2018 10:34 AM  [address] [place] [place] [address] [phone] (office) [phone] (fax)

## 2018-03-20 ENCOUNTER — Encounter: Payer: Self-pay | Admitting: Nurse Practitioner

## 2018-03-20 ENCOUNTER — Ambulatory Visit (INDEPENDENT_AMBULATORY_CARE_PROVIDER_SITE_OTHER): Payer: Medicare Other | Admitting: Nurse Practitioner

## 2018-03-20 VITALS — BP 102/64 | HR 70 | Ht 70.5 in | Wt 185.2 lb

## 2018-03-20 DIAGNOSIS — I495 Sick sinus syndrome: Secondary | ICD-10-CM

## 2018-03-20 DIAGNOSIS — I10 Essential (primary) hypertension: Secondary | ICD-10-CM | POA: Diagnosis not present

## 2018-03-20 DIAGNOSIS — I6523 Occlusion and stenosis of bilateral carotid arteries: Secondary | ICD-10-CM | POA: Diagnosis not present

## 2018-03-20 NOTE — Patient Instructions (Addendum)
Medication Instructions:   Your physician recommends that you continue on your current medications as directed. Please refer to the Current Medication list given to you today.   If you need a refill on your cardiac medications before your next appointment, please call your pharmacy.  Labwork: NONE ORDERED  TODAY    Testing/Procedures:NONE ORDERED  TODAY    Follow-Up: .Your physician wants you to follow-up in: [place] will receive a reminder letter in the mail two months in advance. If you don't receive a letter, please call our office to schedule the follow-up appointment.  Your physician wants you to follow-up in:  IN  [place]        Any Other Special Instructions Will Be Listed Below (If Applicable).

## 2018-03-22 DIAGNOSIS — L02419 Cutaneous abscess of limb, unspecified: Secondary | ICD-10-CM | POA: Diagnosis not present

## 2018-03-27 LAB — CUP PACEART INCLINIC DEVICE CHECK
Date Time Interrogation Session: 20190710124730
Implantable Lead Implant Date: 20091023
Implantable Lead Implant Date: 20091023
Implantable Lead Location: 753859
Implantable Lead Location: 753860
Implantable Pulse Generator Implant Date: 20091023
Pulse Gen Model: 5826
Pulse Gen Serial Number: 2231585

## 2018-04-01 ENCOUNTER — Telehealth: Payer: Self-pay | Admitting: Internal Medicine

## 2018-04-01 NOTE — Telephone Encounter (Signed)
Patient reports hit his ankle hard, broke skin several weeks ago. He was given a sulfa antibiotic at PCP and was allergic Went back and was given a different antibiotic for 14 days. Ankles were both swollen during all of this and he thought it had something to do with the antibiotics.  Ankles remain swollen and tight and this is interfering with his activity level. Advised the swelling could be due to amlodipine.  His BP at PCP office was 135/   Pt aware that I will route to Dr. Harrington Challenger and will call him back with recommendations.

## 2018-04-01 NOTE — Telephone Encounter (Signed)
Pt calling   Pt c/o swelling: STAT is pt has developed SOB within 24 hours  1) How much weight have you gained and in what time span? None  2) If swelling, where is the swelling located? ankles  3) Are you currently taking a fluid pill? Yes  Are you currently SOB? No  4) Do you have a log of your daily weights (if so, list)? No  5) Have you gained 3 pounds in a day or 5 pounds in a week? No  6) Have you traveled recently? No

## 2018-04-01 NOTE — Telephone Encounter (Signed)
Can try lasix 40 mg with 10 KCL 2x per week

## 2018-04-04 MED ORDER — POTASSIUM CHLORIDE ER 10 MEQ PO TBCR: 10.0000 meq | EXTENDED_RELEASE_TABLET | ORAL | 3 refills | Status: DC

## 2018-04-04 MED ORDER — FUROSEMIDE 40 MG PO TABS: 40.0000 mg | ORAL_TABLET | ORAL | 3 refills | Status: DC

## 2018-04-04 NOTE — Telephone Encounter (Signed)
Called patient and informed of recommendations. Educated on lasix and potassium and to take both at the same time.  Take on the same two days per week when possible and expect urinary urgency/frequent for several hours after taking. Pt will monitor for 2-3 weeks and will call back if ankle edema does not improve. Pt verbalizes understanding and agreement with this plan.

## 2018-04-11 DIAGNOSIS — I872 Venous insufficiency (chronic) (peripheral): Secondary | ICD-10-CM | POA: Diagnosis not present

## 2018-04-11 DIAGNOSIS — I8312 Varicose veins of left lower extremity with inflammation: Secondary | ICD-10-CM | POA: Diagnosis not present

## 2018-04-11 DIAGNOSIS — I8311 Varicose veins of right lower extremity with inflammation: Secondary | ICD-10-CM | POA: Diagnosis not present

## 2018-04-11 DIAGNOSIS — Z85828 Personal history of other malignant neoplasm of skin: Secondary | ICD-10-CM | POA: Diagnosis not present

## 2018-04-18 ENCOUNTER — Other Ambulatory Visit: Payer: Self-pay | Admitting: Internal Medicine

## 2018-04-29 DIAGNOSIS — J209 Acute bronchitis, unspecified: Secondary | ICD-10-CM | POA: Diagnosis not present

## 2018-05-18 ENCOUNTER — Other Ambulatory Visit: Payer: Self-pay | Admitting: Internal Medicine

## 2018-05-30 ENCOUNTER — Other Ambulatory Visit: Payer: Self-pay | Admitting: Internal Medicine

## 2018-06-18 DIAGNOSIS — Z23 Encounter for immunization: Secondary | ICD-10-CM | POA: Diagnosis not present

## 2018-07-15 DIAGNOSIS — H179 Unspecified corneal scar and opacity: Secondary | ICD-10-CM | POA: Diagnosis not present

## 2018-07-15 DIAGNOSIS — Z961 Presence of intraocular lens: Secondary | ICD-10-CM | POA: Diagnosis not present

## 2018-08-08 DIAGNOSIS — G4700 Insomnia, unspecified: Secondary | ICD-10-CM | POA: Diagnosis not present

## 2018-08-08 DIAGNOSIS — I10 Essential (primary) hypertension: Secondary | ICD-10-CM | POA: Diagnosis not present

## 2018-08-08 DIAGNOSIS — L719 Rosacea, unspecified: Secondary | ICD-10-CM | POA: Diagnosis not present

## 2018-08-08 DIAGNOSIS — N400 Enlarged prostate without lower urinary tract symptoms: Secondary | ICD-10-CM | POA: Diagnosis not present

## 2018-08-08 DIAGNOSIS — E7800 Pure hypercholesterolemia, unspecified: Secondary | ICD-10-CM | POA: Diagnosis not present

## 2018-08-10 ENCOUNTER — Other Ambulatory Visit: Payer: Self-pay | Admitting: Internal Medicine

## 2018-08-12 ENCOUNTER — Telehealth: Payer: Self-pay | Admitting: Internal Medicine

## 2018-08-12 MED ORDER — POTASSIUM CHLORIDE ER 10 MEQ PO TBCR: 10.0000 meq | EXTENDED_RELEASE_TABLET | ORAL | 3 refills | Status: DC

## 2018-08-12 MED ORDER — FUROSEMIDE 40 MG PO TABS: 40.0000 mg | ORAL_TABLET | ORAL | 3 refills | Status: DC

## 2018-08-12 NOTE — Telephone Encounter (Signed)
New Message   Pt c/o BP issue: STAT if pt c/o blurred vision, one-sided weakness or slurred speech  1. What are your last 5 BP readings? Pt states averages around 175/75  2. Are you having any other symptoms (ex. Dizziness, headache, blurred vision, passed out)? No other symptoms   3. What is your BP issue? Pt states he is concerned about his blood pressure an wants to make sure everthing is well enough for him to travel to [place]. Please call

## 2018-08-12 NOTE — Telephone Encounter (Signed)
This am BP was 175//75.  - after medications. Has been high last 1-2 wks - 160s-170s. Reviewed meds. He has not been taking spironolactone.  Thinks he ran out and threw the bottle away and then didn't ever pick up the refill.  Also - he reports no ankle swelling.  Was taking lasix and potassium twice a week for this.  He is going to cut back to once a week for both and will monitor for swelling.    Will continue to monitor BP at home and will call back if BP stays higher.  Otherwise, has APP appt Dec 23. Going to [facility] for Jan, Feb.  Aware I will inform Dr. Harrington Challenger and will call him if she has other recommendations.

## 2018-08-13 MED ORDER — SPIRONOLACTONE 25 MG PO TABS: 12.5000 mg | ORAL_TABLET | Freq: Every day | ORAL | 3 refills | Status: DC

## 2018-08-13 NOTE — Telephone Encounter (Signed)
I would keep appt for 12/23    I would also refill aldactone 12.5 mg daily

## 2018-08-13 NOTE — Addendum Note (Signed)
Addended by: Rodman Key on: 08/13/2018 05:02 PM   Modules accepted: Orders

## 2018-08-13 NOTE — Telephone Encounter (Signed)
Patient is aware of this plan already and has refills at pharmacy for aldactone 12.5 mg.  Sent new prescription.

## 2018-08-25 ENCOUNTER — Other Ambulatory Visit: Payer: Self-pay | Admitting: Internal Medicine

## 2018-09-09 ENCOUNTER — Encounter: Payer: Self-pay | Admitting: Cardiology

## 2018-09-09 ENCOUNTER — Ambulatory Visit (INDEPENDENT_AMBULATORY_CARE_PROVIDER_SITE_OTHER): Payer: Medicare Other | Admitting: Cardiology

## 2018-09-09 VITALS — BP 136/64 | HR 72 | Ht 70.5 in | Wt 180.8 lb

## 2018-09-09 DIAGNOSIS — I495 Sick sinus syndrome: Secondary | ICD-10-CM | POA: Diagnosis not present

## 2018-09-09 DIAGNOSIS — I6523 Occlusion and stenosis of bilateral carotid arteries: Secondary | ICD-10-CM | POA: Diagnosis not present

## 2018-09-09 DIAGNOSIS — I779 Disorder of arteries and arterioles, unspecified: Secondary | ICD-10-CM | POA: Insufficient documentation

## 2018-09-09 DIAGNOSIS — I2510 Atherosclerotic heart disease of native coronary artery without angina pectoris: Secondary | ICD-10-CM

## 2018-09-09 DIAGNOSIS — E785 Hyperlipidemia, unspecified: Secondary | ICD-10-CM | POA: Diagnosis not present

## 2018-09-09 DIAGNOSIS — Z950 Presence of cardiac pacemaker: Secondary | ICD-10-CM

## 2018-09-09 DIAGNOSIS — I10 Essential (primary) hypertension: Secondary | ICD-10-CM | POA: Diagnosis not present

## 2018-09-09 DIAGNOSIS — I739 Peripheral vascular disease, unspecified: Secondary | ICD-10-CM

## 2018-09-09 NOTE — Progress Notes (Signed)
Cardiology Office Note:    Date:  09/09/2018   ID:  [name], DOB 1931/06/14, MRN [id number]  PCP:  Shirline Frees, MD  Cardiologist:  Dorris Carnes, MD  Electrophysiologist: Dr. Rayann Heman  Referring MD: Shirline Frees, MD   Chief Complaint  Patient presents with  . Follow-up    CAD, HTN    History of Present Illness:    [name] is a 84 y.o. male with a past medical history significant for CAD s/p rotational atherectomy and DES to RCA 2009  (LHC at that time: prox to mid RCA 90-95% then 90% - tx with PCI; dRCA 40-60%, LAD 70-80% after D2, 40-50% at D1, oD2 60-70%, oCFX 40%, OM1 80%, AVCFX 30-40%), hypertension, SSS s/p PPM, hyperlipidemia and solitary kidney.  [name] was last seen in the office on 02/18/2018 by Dr. Harrington Challenger at which time he was having intermittent elevated blood pressure.  Spironolactone was added.   Device History: STJ dual chamber PPM implanted 2009 for symptomatic bradycardia  [name] is here today for 6 month follow up alone. He reports that he was taking it until he missed a few weeks in November with BP going up. He is now back to taking it and home BP's are 120's-130's.   He gets out of the house every day. He goes to the mall about 3 times per week to walk. He is getting ready to go to [facility] for a couple of months and he will walk on the beach for an hour. No chest pain/pressure/tightness or shortness of breath with activity. No palpitations, orthopnea, PND, lightheadedness or syncope.   He tries to limit salt but he does eat out for about one meal per day. He avoids fried foods, red meat and adding salt. He tries to eat vegetables.   Past Medical History:  Diagnosis Date  . Coronary artery disease    PCI RCA 2009  . Dyslipidemia   . Hypertension   . Scleritis, unspecified    treated with prednisone  . Seizure [facility])    felt due to cyclosporin in setting of electrolyte disorder  . Solitary kidney   . Symptomatic bradycardia    a. s/p STJ dual  chamber pacemaker    Past Surgical History:  Procedure Laterality Date  . PACEMAKER INSERTION  2009   STJ dual chamber pacemaker implanted by Dr Olevia Perches for symptomatic bradycardia    Current Medications: Current Meds  Medication Sig  . amLODipine (NORVASC) 5 MG tablet TAKE 1 TABLET BY MOUTH ONCE DAILY  . aspirin 81 MG tablet Take 81 mg by mouth daily.   Marland Kitchen atorvastatin (LIPITOR) 40 MG tablet Take 1 tablet (40 mg total) by mouth daily at 6 PM.  . beta carotene w/minerals (OCUVITE) tablet Take 1 tablet by mouth daily.  . clopidogrel (PLAVIX) 75 MG tablet TAKE 1 TABLET BY MOUTH ONCE DAILY  . clopidogrel (PLAVIX) 75 MG tablet TAKE 1 TABLET BY MOUTH ONCE DAILY  . finasteride (PROSCAR) 5 MG tablet Take 5 mg by mouth daily.  . metroNIDAZOLE (METROCREAM) 0.75 % cream Apply 1 application topically 2 (two) times daily as needed (skin).   . Multiple Vitamins-Minerals (CENTRUM SILVER PO) Take 1 capsule by mouth daily.   Marland Kitchen NITROSTAT 0.4 MG SL tablet DISSOLVE ONE TABLET UNDER THE TONGUE EVERY 5 MINUTES AS NEEDED FOR CHEST PAIN.  DO NOT EXCEED A TOTAL OF 3 DOSES IN 15 MINUTES  . OVER THE COUNTER MEDICATION Take 1 capsule by mouth daily as  needed (constipation). Stool softner  . phenylephrine (NEO-SYNEPHRINE) 0.25 % nasal spray 1 spray as needed. Use as directed  . potassium chloride (K-DUR) 10 MEQ tablet Take 1 tablet (10 mEq total) by mouth once a week. Take with furosemide  . Simethicone (GAS-X PO) Take 1 capsule by mouth daily as needed (GAS).  Marland Kitchen spironolactone (ALDACTONE) 25 MG tablet Take 0.5 tablets (12.5 mg total) by mouth daily.  Marland Kitchen TEMAZEPAM PO Take 15 mg by mouth 2 (two) times daily.   . [DISCONTINUED] carvedilol (COREG) 3.125 MG tablet TAKE 1 TABLET BY MOUTH TWICE DAILY     Allergies:   Cyclosporine; Maxzide [triamterene-hctz]; Sulfur; and Prednisone   Social History   Socioeconomic History  . Marital status: Widowed    Spouse name: Not on file  . Number of children: 2  . Years of  education: college  . Highest education level: Not on file  Occupational History  . Occupation: retired  Scientific laboratory technician  . Financial resource strain: Not on file  . Food insecurity:    Worry: Not on file    Inability: Not on file  . Transportation needs:    Medical: Not on file    Non-medical: Not on file  Tobacco Use  . Smoking status: Never Smoker  . Smokeless tobacco: Never Used  Substance and Sexual Activity  . Alcohol use: No  . Drug use: No  . Sexual activity: Not on file  Lifestyle  . Physical activity:    Days per week: Not on file    Minutes per session: Not on file  . Stress: Not on file  Relationships  . Social connections:    Talks on phone: Not on file    Gets together: Not on file    Attends religious service: Not on file    Active member of club or organization: Not on file    Attends meetings of clubs or organizations: Not on file    Relationship status: Not on file  Other Topics Concern  . Not on file  Social History Narrative   Married   Tobacco   etoh     Family History: The patient's family history includes Arthritis/Rheumatoid in his mother. ROS:   Please see the history of present illness.     All other systems reviewed and are negative.  EKGs/Labs/Other Studies Reviewed:    The following studies were reviewed today:  Carotid ultrasound 01/14/2018 Final Interpretation: Right Carotid: Velocities in the right ICA are consistent with a 1-39% stenosis.                The ECA appears >50% stenosed.  Left Carotid: Velocities in the left ICA are consistent with a 60-79% stenosis.               Non-hemodynamically significant plaque noted in the CCA. The ECA               appears >50% stenosed.  Vertebrals:  Bilateral vertebral arteries demonstrate antegrade flow. Subclavians: Normal flow hemodynamics were seen in bilateral subclavian              arteries.  EKG:  EKG is not ordered today.    Recent Labs: 02/26/2018: BUN 18; Creatinine,  Ser 1.06; Potassium 4.8; Sodium 137   Recent Lipid Panel    Component Value Date/Time   CHOL 135 06/29/2009 1230   TRIG 37.0 06/29/2009 1230   HDL 63.70 06/29/2009 1230   CHOLHDL 2 06/29/2009 1230   VLDL 7.4 06/29/2009  [place] 06/29/2009 1230    Physical Exam:    VS:  BP 136/64   Pulse 72   Ht 5' 10.5" (1.791 m)   Wt 180 lb 12.8 oz (82 kg)   SpO2 98%   BMI 25.58 kg/m     Wt Readings from Last 3 Encounters:  09/09/18 180 lb 12.8 oz (82 kg)  03/20/18 185 lb 4 oz (84 kg)  02/18/18 188 lb 12.8 oz (85.6 kg)     Physical Exam  Constitutional: He is oriented to person, place, and time. He appears well-developed and well-nourished. No distress.  HENT:  Head: Normocephalic and atraumatic.  Neck: Normal range of motion. Neck supple. No JVD present. Carotid bruit is present.  Bilateral carotid bruits  Cardiovascular: Normal rate, regular rhythm, normal heart sounds and intact distal pulses. Exam reveals no gallop and no friction rub.  No murmur heard. Pulmonary/Chest: Effort normal and breath sounds normal. No respiratory distress. He has no wheezes. He has no rales.  Abdominal: Soft. Bowel sounds are normal.  Musculoskeletal: Normal range of motion.        General: No edema.  Neurological: He is alert and oriented to person, place, and time.  Skin: Skin is warm and dry.  Psychiatric: He has a normal mood and affect. His behavior is normal. Judgment and thought content normal.  Vitals reviewed.    ASSESSMENT:    1. HYPERTENSION, BENIGN   2. Atherosclerosis of native coronary artery of native heart without angina pectoris   3. Hyperlipidemia LDL goal <70   4. SSS (sick sinus syndrome) ([place])   5. Pacemaker-St.Jude   6. Bilateral carotid artery stenosis    PLAN:    In order of problems listed above:  Hypertension -On amlodipine 5 mg, carvedilol 2, Lasix 40 mg, spironolactone 12.5 mg -Home BP 120's-130's. He says he has white coat HTN. BP initially elevated,  but 136/64 at end of visit. -Continue current medictions -Low sodium diet, he goes out to eat a lot.   CAD -S/P DES to RCA 2009 -Medical management beta-blocker, statin, aspirin, Plavix -No anginal symptoms  Hyperlipidemia -On atorvastatin 40 mg daily.  Target LDL <70.  Followed by PCP. LDL 67 in 01/2017, at goal of <70.  SSS S/P PPM -Followed by Dr. Rayann Heman  Carotid artery disease -Carotid ultrasound 12/2017 showed left ICA with 60-79% stenosis, right ICA consistent with 1-39% stenosis. -Recommendation for follow-up in 12/2018  Medication Adjustments/Labs and Tests Ordered: Current medicines are reviewed at length with the patient today.  Concerns regarding medicines are outlined above. Labs and tests ordered and medication changes are outlined in the patient instructions below:  Patient Instructions  Medication Instructions:  Your physician recommends that you continue on your current medications as directed. Please refer to the Current Medication list given to you today.  If you need a refill on your cardiac medications before your next appointment, please call your pharmacy.   Lab work: None  If you have labs (blood work) drawn today and your tests are completely normal, you will receive your results only by: Marland Kitchen MyChart Message (if you have MyChart) OR . A paper copy in the mail If you have any lab test that is abnormal or we need to change your treatment, we will call you to review the results.  Testing/Procedures: None  Follow-Up: At [facility], you and your health needs are our priority.  As part of our continuing mission to provide you with exceptional heart care,  we have created designated Provider Care Teams.  These Care Teams include your primary Cardiologist (physician) and Advanced Practice Providers (APPs -  Physician Assistants and Nurse Practitioners) who all work together to provide you with the care you need, when you need it. You will need a follow up  appointment in:  6 months.  Please call our office 2 months in advance to schedule this appointment.  You may see Dorris Carnes, MD or one of the following Advanced Practice Providers on your designated Care Team: Richardson Dopp, PA-C [place], [place] . Daune Perch, NP  Any Other Special Instructions Will Be Listed Below (If Applicable).  DASH Eating Plan DASH stands for "Dietary Approaches to Stop Hypertension." The DASH eating plan is a healthy eating plan that has been shown to reduce high blood pressure (hypertension). It may also reduce your risk for type 2 diabetes, heart disease, and stroke. The DASH eating plan may also help with weight loss. What are tips for following this plan?  General guidelines  Avoid eating more than 2,300 mg (milligrams) of salt (sodium) a day. If you have hypertension, you may need to reduce your sodium intake to 1,500 mg a day.  Limit alcohol intake to no more than 1 drink a day for nonpregnant women and 2 drinks a day for men. One drink equals 12 oz of beer, 5 oz of wine, or 1 oz of hard liquor.  Work with your health care provider to maintain a healthy body weight or to lose weight. Ask what an ideal weight is for you.  Get at least 30 minutes of exercise that causes your heart to beat faster (aerobic exercise) most days of the week. Activities may include walking, swimming, or biking.  Work with your health care provider or diet and nutrition specialist (dietitian) to adjust your eating plan to your individual calorie needs. Reading food labels   Check food labels for the amount of sodium per serving. Choose foods with less than 5 percent of the Daily Value of sodium. Generally, foods with less than 300 mg of sodium per serving fit into this eating plan.  To find whole grains, look for the word "whole" as the first word in the ingredient list. Shopping  Buy products labeled as "low-sodium" or "no salt added."  Buy fresh foods. Avoid canned foods  and premade or frozen meals. Cooking  Avoid adding salt when cooking. Use salt-free seasonings or herbs instead of table salt or sea salt. Check with your health care provider or pharmacist before using salt substitutes.  Do not fry foods. Cook foods using healthy methods such as baking, boiling, grilling, and broiling instead.  Cook with heart-healthy oils, such as olive, canola, soybean, or sunflower oil. Meal planning  Eat a balanced diet that includes: ? 5 or more servings of fruits and vegetables each day. At each meal, try to fill half of your plate with fruits and vegetables. ? Up to 6-8 servings of whole grains each day. ? Less than 6 oz of lean meat, poultry, or fish each day. A 3-oz serving of meat is about the same size as a deck of cards. One egg equals 1 oz. ? 2 servings of low-fat dairy each day. ? A serving of nuts, seeds, or beans 5 times each week. ? Heart-healthy fats. Healthy fats called Omega-3 fatty acids are found in foods such as flaxseeds and coldwater fish, like sardines, salmon, and mackerel.  Limit how much you eat of the following: ?  Canned or prepackaged foods. ? Food that is high in trans fat, such as fried foods. ? Food that is high in saturated fat, such as fatty meat. ? Sweets, desserts, sugary drinks, and other foods with added sugar. ? Full-fat dairy products.  Do not salt foods before eating.  Try to eat at least 2 vegetarian meals each week.  Eat more home-cooked food and less restaurant, buffet, and fast food.  When eating at a restaurant, ask that your food be prepared with less salt or no salt, if possible. What foods are recommended? The items listed may not be a complete list. Talk with your dietitian about what dietary choices are best for you. Grains Whole-grain or whole-wheat bread. Whole-grain or whole-wheat pasta. Brown rice. [place]. Bulgur. Whole-grain and low-sodium cereals. Pita bread. Low-fat, low-sodium crackers.  Whole-wheat flour tortillas. Vegetables Fresh or frozen vegetables (raw, steamed, roasted, or grilled). Low-sodium or reduced-sodium tomato and vegetable juice. Low-sodium or reduced-sodium tomato sauce and tomato paste. Low-sodium or reduced-sodium canned vegetables. Fruits All fresh, dried, or frozen fruit. Canned fruit in natural juice (without added sugar). Meat and other protein foods Skinless chicken or [place]. Ground chicken or [place]. Pork with fat trimmed off. Fish and seafood. Egg whites. Dried beans, peas, or lentils. Unsalted nuts, nut butters, and seeds. Unsalted canned beans. Lean cuts of beef with fat trimmed off. Low-sodium, lean deli meat. Dairy Low-fat (1%) or fat-free (skim) milk. Fat-free, low-fat, or reduced-fat cheeses. Nonfat, low-sodium ricotta or cottage cheese. Low-fat or nonfat yogurt. Low-fat, low-sodium cheese. Fats and oils Soft margarine without trans fats. Vegetable oil. Low-fat, reduced-fat, or light mayonnaise and salad dressings (reduced-sodium). Canola, safflower, olive, soybean, and sunflower oils. Avocado. Seasoning and other foods Herbs. Spices. Seasoning mixes without salt. Unsalted popcorn and pretzels. Fat-free sweets. What foods are not recommended? The items listed may not be a complete list. Talk with your dietitian about what dietary choices are best for you. Grains Baked goods made with fat, such as croissants, muffins, or some breads. Dry pasta or rice meal packs. Vegetables Creamed or fried vegetables. Vegetables in a cheese sauce. Regular canned vegetables (not low-sodium or reduced-sodium). Regular canned tomato sauce and paste (not low-sodium or reduced-sodium). Regular tomato and vegetable juice (not low-sodium or reduced-sodium). Angie Fava. Olives. Fruits Canned fruit in a light or heavy syrup. Fried fruit. Fruit in cream or butter sauce. Meat and other protein foods Fatty cuts of meat. Ribs. Fried meat. Berniece Salines. Sausage. Bologna and other  processed lunch meats. Salami. Fatback. Hotdogs. Bratwurst. Salted nuts and seeds. Canned beans with added salt. Canned or smoked fish. Whole eggs or egg yolks. Chicken or [place] with skin. Dairy Whole or 2% milk, cream, and half-and-half. Whole or full-fat cream cheese. Whole-fat or sweetened yogurt. Full-fat cheese. Nondairy creamers. Whipped toppings. Processed cheese and cheese spreads. Fats and oils Butter. Stick margarine. Lard. Shortening. Ghee. Bacon fat. Tropical oils, such as coconut, palm kernel, or palm oil. Seasoning and other foods Salted popcorn and pretzels. Onion salt, garlic salt, seasoned salt, table salt, and sea salt. Worcestershire sauce. Tartar sauce. Barbecue sauce. Teriyaki sauce. Soy sauce, including reduced-sodium. Steak sauce. Canned and packaged gravies. Fish sauce. Oyster sauce. Cocktail sauce. Horseradish that you find on the shelf. Ketchup. Mustard. Meat flavorings and tenderizers. Bouillon cubes. Hot sauce and Tabasco sauce. Premade or packaged marinades. Premade or packaged taco seasonings. Relishes. Regular salad dressings. Where to find more information:  National Heart, Lung, and [place]: https://wilson-eaton.com/  American Heart Association: www.heart.org Summary  The DASH eating plan is a healthy eating plan that has been shown to reduce high blood pressure (hypertension). It may also reduce your risk for type 2 diabetes, heart disease, and stroke.  With the DASH eating plan, you should limit salt (sodium) intake to 2,300 mg a day. If you have hypertension, you may need to reduce your sodium intake to 1,500 mg a day.  When on the DASH eating plan, aim to eat more fresh fruits and vegetables, whole grains, lean proteins, low-fat dairy, and heart-healthy fats.  Work with your health care provider or diet and nutrition specialist (dietitian) to adjust your eating plan to your individual calorie needs. This information is not intended to replace advice given to  you by your health care provider. Make sure you discuss any questions you have with your health care provider. Document Released: 08/24/2011 Document Revised: 08/28/2016 Document Reviewed: 08/28/2016 Elsevier Interactive Patient Education  2019 [place], Daune Perch, NP  09/09/2018 4:09 PM    [facility]

## 2018-09-09 NOTE — Patient Instructions (Signed)
Medication Instructions:  Your physician recommends that you continue on your current medications as directed. Please refer to the Current Medication list given to you today.  If you need a refill on your cardiac medications before your next appointment, please call your pharmacy.   Lab work: None  If you have labs (blood work) drawn today and your tests are completely normal, you will receive your results only by: Marland Kitchen MyChart Message (if you have MyChart) OR . A paper copy in the mail If you have any lab test that is abnormal or we need to change your treatment, we will call you to review the results.  Testing/Procedures: None  Follow-Up: At [facility], you and your health needs are our priority.  As part of our continuing mission to provide you with exceptional heart care, we have created designated Provider Care Teams.  These Care Teams include your primary Cardiologist (physician) and Advanced Practice Providers (APPs -  Physician Assistants and Nurse Practitioners) who all work together to provide you with the care you need, when you need it. You will need a follow up appointment in:  6 months.  Please call our office 2 months in advance to schedule this appointment.  You may see Dorris Carnes, MD or one of the following Advanced Practice Providers on your designated Care Team: Richardson Dopp, PA-C [place], [place] . Daune Perch, NP  Any Other Special Instructions Will Be Listed Below (If Applicable).  DASH Eating Plan DASH stands for "Dietary Approaches to Stop Hypertension." The DASH eating plan is a healthy eating plan that has been shown to reduce high blood pressure (hypertension). It may also reduce your risk for type 2 diabetes, heart disease, and stroke. The DASH eating plan may also help with weight loss. What are tips for following this plan?  General guidelines  Avoid eating more than 2,300 mg (milligrams) of salt (sodium) a day. If you have hypertension, you may need  to reduce your sodium intake to 1,500 mg a day.  Limit alcohol intake to no more than 1 drink a day for nonpregnant women and 2 drinks a day for men. One drink equals 12 oz of beer, 5 oz of wine, or 1 oz of hard liquor.  Work with your health care provider to maintain a healthy body weight or to lose weight. Ask what an ideal weight is for you.  Get at least 30 minutes of exercise that causes your heart to beat faster (aerobic exercise) most days of the week. Activities may include walking, swimming, or biking.  Work with your health care provider or diet and nutrition specialist (dietitian) to adjust your eating plan to your individual calorie needs. Reading food labels   Check food labels for the amount of sodium per serving. Choose foods with less than 5 percent of the Daily Value of sodium. Generally, foods with less than 300 mg of sodium per serving fit into this eating plan.  To find whole grains, look for the word "whole" as the first word in the ingredient list. Shopping  Buy products labeled as "low-sodium" or "no salt added."  Buy fresh foods. Avoid canned foods and premade or frozen meals. Cooking  Avoid adding salt when cooking. Use salt-free seasonings or herbs instead of table salt or sea salt. Check with your health care provider or pharmacist before using salt substitutes.  Do not fry foods. Cook foods using healthy methods such as baking, boiling, grilling, and broiling instead.  Cook with heart-healthy oils,  such as olive, canola, soybean, or sunflower oil. Meal planning  Eat a balanced diet that includes: ? 5 or more servings of fruits and vegetables each day. At each meal, try to fill half of your plate with fruits and vegetables. ? Up to 6-8 servings of whole grains each day. ? Less than 6 oz of lean meat, poultry, or fish each day. A 3-oz serving of meat is about the same size as a deck of cards. One egg equals 1 oz. ? 2 servings of low-fat dairy each day. ? A  serving of nuts, seeds, or beans 5 times each week. ? Heart-healthy fats. Healthy fats called Omega-3 fatty acids are found in foods such as flaxseeds and coldwater fish, like sardines, salmon, and mackerel.  Limit how much you eat of the following: ? Canned or prepackaged foods. ? Food that is high in trans fat, such as fried foods. ? Food that is high in saturated fat, such as fatty meat. ? Sweets, desserts, sugary drinks, and other foods with added sugar. ? Full-fat dairy products.  Do not salt foods before eating.  Try to eat at least 2 vegetarian meals each week.  Eat more home-cooked food and less restaurant, buffet, and fast food.  When eating at a restaurant, ask that your food be prepared with less salt or no salt, if possible. What foods are recommended? The items listed may not be a complete list. Talk with your dietitian about what dietary choices are best for you. Grains Whole-grain or whole-wheat bread. Whole-grain or whole-wheat pasta. Brown rice. [place]. Bulgur. Whole-grain and low-sodium cereals. Pita bread. Low-fat, low-sodium crackers. Whole-wheat flour tortillas. Vegetables Fresh or frozen vegetables (raw, steamed, roasted, or grilled). Low-sodium or reduced-sodium tomato and vegetable juice. Low-sodium or reduced-sodium tomato sauce and tomato paste. Low-sodium or reduced-sodium canned vegetables. Fruits All fresh, dried, or frozen fruit. Canned fruit in natural juice (without added sugar). Meat and other protein foods Skinless chicken or [place]. Ground chicken or [place]. Pork with fat trimmed off. Fish and seafood. Egg whites. Dried beans, peas, or lentils. Unsalted nuts, nut butters, and seeds. Unsalted canned beans. Lean cuts of beef with fat trimmed off. Low-sodium, lean deli meat. Dairy Low-fat (1%) or fat-free (skim) milk. Fat-free, low-fat, or reduced-fat cheeses. Nonfat, low-sodium ricotta or cottage cheese. Low-fat or nonfat yogurt. Low-fat,  low-sodium cheese. Fats and oils Soft margarine without trans fats. Vegetable oil. Low-fat, reduced-fat, or light mayonnaise and salad dressings (reduced-sodium). Canola, safflower, olive, soybean, and sunflower oils. Avocado. Seasoning and other foods Herbs. Spices. Seasoning mixes without salt. Unsalted popcorn and pretzels. Fat-free sweets. What foods are not recommended? The items listed may not be a complete list. Talk with your dietitian about what dietary choices are best for you. Grains Baked goods made with fat, such as croissants, muffins, or some breads. Dry pasta or rice meal packs. Vegetables Creamed or fried vegetables. Vegetables in a cheese sauce. Regular canned vegetables (not low-sodium or reduced-sodium). Regular canned tomato sauce and paste (not low-sodium or reduced-sodium). Regular tomato and vegetable juice (not low-sodium or reduced-sodium). Angie Fava. Olives. Fruits Canned fruit in a light or heavy syrup. Fried fruit. Fruit in cream or butter sauce. Meat and other protein foods Fatty cuts of meat. Ribs. Fried meat. Berniece Salines. Sausage. Bologna and other processed lunch meats. Salami. Fatback. Hotdogs. Bratwurst. Salted nuts and seeds. Canned beans with added salt. Canned or smoked fish. Whole eggs or egg yolks. Chicken or [place] with skin. Dairy Whole or 2% milk, cream,  and half-and-half. Whole or full-fat cream cheese. Whole-fat or sweetened yogurt. Full-fat cheese. Nondairy creamers. Whipped toppings. Processed cheese and cheese spreads. Fats and oils Butter. Stick margarine. Lard. Shortening. Ghee. Bacon fat. Tropical oils, such as coconut, palm kernel, or palm oil. Seasoning and other foods Salted popcorn and pretzels. Onion salt, garlic salt, seasoned salt, table salt, and sea salt. Worcestershire sauce. Tartar sauce. Barbecue sauce. Teriyaki sauce. Soy sauce, including reduced-sodium. Steak sauce. Canned and packaged gravies. Fish sauce. Oyster sauce. Cocktail sauce.  Horseradish that you find on the shelf. Ketchup. Mustard. Meat flavorings and tenderizers. Bouillon cubes. Hot sauce and Tabasco sauce. Premade or packaged marinades. Premade or packaged taco seasonings. Relishes. Regular salad dressings. Where to find more information:  National Heart, Lung, and [place]: https://wilson-eaton.com/  American Heart Association: www.heart.org Summary  The DASH eating plan is a healthy eating plan that has been shown to reduce high blood pressure (hypertension). It may also reduce your risk for type 2 diabetes, heart disease, and stroke.  With the DASH eating plan, you should limit salt (sodium) intake to 2,300 mg a day. If you have hypertension, you may need to reduce your sodium intake to 1,500 mg a day.  When on the DASH eating plan, aim to eat more fresh fruits and vegetables, whole grains, lean proteins, low-fat dairy, and heart-healthy fats.  Work with your health care provider or diet and nutrition specialist (dietitian) to adjust your eating plan to your individual calorie needs. This information is not intended to replace advice given to you by your health care provider. Make sure you discuss any questions you have with your health care provider. Document Released: 08/24/2011 Document Revised: 08/28/2016 Document Reviewed: 08/28/2016 Elsevier Interactive Patient Education  2019 [employer].

## 2018-11-19 ENCOUNTER — Other Ambulatory Visit: Payer: Self-pay | Admitting: Internal Medicine

## 2018-11-25 ENCOUNTER — Other Ambulatory Visit: Payer: Self-pay | Admitting: Internal Medicine

## 2019-01-27 DIAGNOSIS — D1801 Hemangioma of skin and subcutaneous tissue: Secondary | ICD-10-CM | POA: Diagnosis not present

## 2019-01-27 DIAGNOSIS — I872 Venous insufficiency (chronic) (peripheral): Secondary | ICD-10-CM | POA: Diagnosis not present

## 2019-01-27 DIAGNOSIS — L821 Other seborrheic keratosis: Secondary | ICD-10-CM | POA: Diagnosis not present

## 2019-01-27 DIAGNOSIS — I8311 Varicose veins of right lower extremity with inflammation: Secondary | ICD-10-CM | POA: Diagnosis not present

## 2019-01-27 DIAGNOSIS — L570 Actinic keratosis: Secondary | ICD-10-CM | POA: Diagnosis not present

## 2019-01-27 DIAGNOSIS — L723 Sebaceous cyst: Secondary | ICD-10-CM | POA: Diagnosis not present

## 2019-01-27 DIAGNOSIS — L84 Corns and callosities: Secondary | ICD-10-CM | POA: Diagnosis not present

## 2019-01-27 DIAGNOSIS — I8312 Varicose veins of left lower extremity with inflammation: Secondary | ICD-10-CM | POA: Diagnosis not present

## 2019-01-27 DIAGNOSIS — Z85828 Personal history of other malignant neoplasm of skin: Secondary | ICD-10-CM | POA: Diagnosis not present

## 2019-01-27 DIAGNOSIS — D692 Other nonthrombocytopenic purpura: Secondary | ICD-10-CM | POA: Diagnosis not present

## 2019-02-03 ENCOUNTER — Other Ambulatory Visit: Payer: Self-pay | Admitting: Internal Medicine

## 2019-02-03 DIAGNOSIS — I6523 Occlusion and stenosis of bilateral carotid arteries: Secondary | ICD-10-CM

## 2019-02-06 DIAGNOSIS — L719 Rosacea, unspecified: Secondary | ICD-10-CM | POA: Diagnosis not present

## 2019-02-06 DIAGNOSIS — Z0000 Encounter for general adult medical examination without abnormal findings: Secondary | ICD-10-CM | POA: Diagnosis not present

## 2019-02-06 DIAGNOSIS — I10 Essential (primary) hypertension: Secondary | ICD-10-CM | POA: Diagnosis not present

## 2019-02-06 DIAGNOSIS — N400 Enlarged prostate without lower urinary tract symptoms: Secondary | ICD-10-CM | POA: Diagnosis not present

## 2019-02-06 DIAGNOSIS — G4700 Insomnia, unspecified: Secondary | ICD-10-CM | POA: Diagnosis not present

## 2019-02-06 DIAGNOSIS — E7800 Pure hypercholesterolemia, unspecified: Secondary | ICD-10-CM | POA: Diagnosis not present

## 2019-02-17 ENCOUNTER — Other Ambulatory Visit: Payer: Self-pay | Admitting: Internal Medicine

## 2019-02-18 ENCOUNTER — Ambulatory Visit (HOSPITAL_COMMUNITY)
Admission: RE | Admit: 2019-02-18 | Discharge: 2019-02-18 | Disposition: A | Discharge status: Home / Self Care | Payer: Medicare Other | Source: Ambulatory Visit | Attending: Cardiology | Admitting: Cardiology

## 2019-02-18 ENCOUNTER — Other Ambulatory Visit (HOSPITAL_COMMUNITY): Payer: Self-pay | Admitting: Internal Medicine

## 2019-02-18 ENCOUNTER — Other Ambulatory Visit: Payer: Self-pay

## 2019-02-18 DIAGNOSIS — I6523 Occlusion and stenosis of bilateral carotid arteries: Secondary | ICD-10-CM | POA: Diagnosis not present

## 2019-02-20 ENCOUNTER — Other Ambulatory Visit: Payer: Self-pay | Admitting: Internal Medicine

## 2019-02-21 ENCOUNTER — Telehealth: Payer: Self-pay

## 2019-02-21 DIAGNOSIS — I6523 Occlusion and stenosis of bilateral carotid arteries: Secondary | ICD-10-CM

## 2019-02-21 NOTE — Telephone Encounter (Signed)
-----   Message from Fay Records, MD sent at 02/20/2019  4:24 PM EDT ----- Carotid artery plaquing is stable   Keep on same meds  Sched f/u in 1 year

## 2019-02-21 NOTE — Telephone Encounter (Signed)
Pt verbalized understanding of his results.

## 2019-02-23 ENCOUNTER — Other Ambulatory Visit: Payer: Self-pay | Admitting: Internal Medicine

## 2019-02-25 NOTE — Telephone Encounter (Signed)
This encounter was created in error - please disregard.

## 2019-02-28 DIAGNOSIS — E7800 Pure hypercholesterolemia, unspecified: Secondary | ICD-10-CM | POA: Diagnosis not present

## 2019-02-28 DIAGNOSIS — I10 Essential (primary) hypertension: Secondary | ICD-10-CM | POA: Diagnosis not present

## 2019-03-03 ENCOUNTER — Other Ambulatory Visit: Payer: Self-pay | Admitting: Internal Medicine

## 2019-03-19 ENCOUNTER — Telehealth: Payer: Self-pay | Admitting: Physician Assistant

## 2019-03-19 NOTE — Telephone Encounter (Signed)

## 2019-03-20 ENCOUNTER — Encounter: Payer: Self-pay | Admitting: Physician Assistant

## 2019-03-20 ENCOUNTER — Ambulatory Visit (INDEPENDENT_AMBULATORY_CARE_PROVIDER_SITE_OTHER): Payer: Medicare Other | Admitting: Physician Assistant

## 2019-03-20 ENCOUNTER — Ambulatory Visit (INDEPENDENT_AMBULATORY_CARE_PROVIDER_SITE_OTHER): Payer: Medicare Other | Admitting: Student

## 2019-03-20 ENCOUNTER — Encounter (INDEPENDENT_AMBULATORY_CARE_PROVIDER_SITE_OTHER): Payer: Self-pay

## 2019-03-20 ENCOUNTER — Other Ambulatory Visit: Payer: Self-pay

## 2019-03-20 VITALS — BP 152/82 | HR 67 | Ht 70.5 in | Wt 185.0 lb

## 2019-03-20 DIAGNOSIS — I6523 Occlusion and stenosis of bilateral carotid arteries: Secondary | ICD-10-CM | POA: Diagnosis not present

## 2019-03-20 DIAGNOSIS — I2510 Atherosclerotic heart disease of native coronary artery without angina pectoris: Secondary | ICD-10-CM | POA: Diagnosis not present

## 2019-03-20 DIAGNOSIS — Z7189 Other specified counseling: Secondary | ICD-10-CM | POA: Diagnosis not present

## 2019-03-20 DIAGNOSIS — E785 Hyperlipidemia, unspecified: Secondary | ICD-10-CM | POA: Diagnosis not present

## 2019-03-20 DIAGNOSIS — R600 Localized edema: Secondary | ICD-10-CM

## 2019-03-20 DIAGNOSIS — I495 Sick sinus syndrome: Secondary | ICD-10-CM

## 2019-03-20 DIAGNOSIS — I10 Essential (primary) hypertension: Secondary | ICD-10-CM | POA: Diagnosis not present

## 2019-03-20 LAB — CUP PACEART INCLINIC DEVICE CHECK
Battery Impedance: 2500 Ohm
Battery Voltage: 2.78 V
Brady Statistic RA Percent Paced: 33 %
Brady Statistic RV Percent Paced: 1.9 %
Date Time Interrogation Session: 20200702141317
Implantable Lead Implant Date: 20091023
Implantable Lead Implant Date: 20091023
Implantable Lead Location: 753859
Implantable Lead Location: 753860
Implantable Pulse Generator Implant Date: 20091023
Lead Channel Impedance Value: 388 Ohm
Lead Channel Impedance Value: 443 Ohm
Lead Channel Pacing Threshold Amplitude: 0.75 V
Lead Channel Pacing Threshold Amplitude: 1 V
Lead Channel Pacing Threshold Pulse Width: 0.5 ms
Lead Channel Pacing Threshold Pulse Width: 0.5 ms
Lead Channel Setting Pacing Amplitude: 2 V
Lead Channel Setting Pacing Amplitude: 2.5 V
Lead Channel Setting Pacing Pulse Width: 0.5 ms
Lead Channel Setting Sensing Sensitivity: 2 mV
Pulse Gen Model: 5826
Pulse Gen Serial Number: 2231585

## 2019-03-20 NOTE — Progress Notes (Signed)
Cardiology Office Note:    Date:  03/20/2019   ID:  [name], DOB 22-Apr-1931, MRN [id number]  PCP:  Shirline Frees, MD  Cardiologist:  Dorris Carnes, MD  Electrophysiologist:  None   Referring MD: Shirline Frees, MD   Chief Complaint  Patient presents with  . Follow-up    6 month follow-up  . Leg Swelling    History of Present Illness:    [name] is a 85 y.o. male with a history of CAD s/p rotational atherectomy and DES to RCA in 2009, SSS s/p PPM in 2009, hypertension, hyperlipidemia, and solitary kidney. Left heart catheterization in 2009 showed 90-95% stenosis of the proximal to mid RCA followed by 90% stenosis and then 40 to 60% stenosis in the distal RCA, 70-80% stenosis of the LAD after D2, 40 to 50% stenosis at D1, 60 to 70% of oD2, 40% of oCFX, 80% of OM1, and 30 to 40% of AVCFX. Patient underwent atherectomy and PCI of the RCA.   Spironolactone was added in 02/2018 for additional BP control. Patient was last seen by Pecolia Ades in 08/2018 for follow-up at which time he was doing well from a cardiac standpoint. Patient had repeat carotid ultrasounds earlier this month on 02/18/2019 which were stable.   Patient returns today for 25-month follow-up. Here alone. Patient doing very well. He denies any chest pain, shortness of breath, palpitations, lightheadedness, dizziness, or syncope. He reports some lower extremity edema, left slightly greater than right, but this is not new. He states left leg has been slightly larger than right since an injury to his leg 2 years ago. No orthopnea or PND. No claudication. Patient still very active and walks for 30-45 minutes on a treadmill every day.  He tries to watch how much sodium he eats and eats a lot of grilled chicken and fish. He has no complaints at this time.  Past Medical History:  Diagnosis Date  . Coronary artery disease    PCI RCA 2009  . Dyslipidemia   . Hypertension   . Scleritis, unspecified    treated with prednisone   . Seizure [facility])    felt due to cyclosporin in setting of electrolyte disorder  . Solitary kidney   . Symptomatic bradycardia    a. s/p STJ dual chamber pacemaker    Past Surgical History:  Procedure Laterality Date  . PACEMAKER INSERTION  2009   STJ dual chamber pacemaker implanted by Dr Olevia Perches for symptomatic bradycardia    Current Medications: Current Meds  Medication Sig  . amLODipine (NORVASC) 5 MG tablet Take 1 tablet by mouth once daily  . aspirin 81 MG tablet Take 81 mg by mouth daily.   Marland Kitchen atorvastatin (LIPITOR) 40 MG tablet TAKE 1 TABLET BY MOUTH ONCE DAILY AT  6PM  . beta carotene w/minerals (OCUVITE) tablet Take 1 tablet by mouth daily.  . carvedilol (COREG) 3.125 MG tablet Take 1 tablet by mouth twice daily  . clopidogrel (PLAVIX) 75 MG tablet TAKE 1 TABLET BY MOUTH ONCE DAILY  . finasteride (PROSCAR) 5 MG tablet Take 5 mg by mouth daily.  . furosemide (LASIX) 40 MG tablet Take 40 mg by mouth once a week.  . metroNIDAZOLE (METROCREAM) 0.75 % cream Apply 1 application topically 2 (two) times daily as needed (skin).   . Multiple Vitamins-Minerals (CENTRUM SILVER PO) Take 1 capsule by mouth daily.   Marland Kitchen NITROSTAT 0.4 MG SL tablet DISSOLVE ONE TABLET UNDER THE TONGUE EVERY 5 MINUTES AS NEEDED  FOR CHEST PAIN.  DO NOT EXCEED A TOTAL OF 3 DOSES IN 15 MINUTES  . potassium chloride (K-DUR) 10 MEQ tablet Take 1 tablet (10 mEq total) by mouth once a week. Take with furosemide  . spironolactone (ALDACTONE) 25 MG tablet Take 0.5 tablets (12.5 mg total) by mouth daily.  Marland Kitchen TEMAZEPAM PO Take 15 mg by mouth 2 (two) times daily.      Allergies:   Cyclosporine, Maxzide [triamterene-hctz], Sulfur, and Prednisone   Social History   Socioeconomic History  . Marital status: Widowed    Spouse name: Not on file  . Number of children: 2  . Years of education: college  . Highest education level: Not on file  Occupational History  . Occupation: retired  Scientific laboratory technician  . Financial resource  strain: Not on file  . Food insecurity    Worry: Not on file    Inability: Not on file  . Transportation needs    Medical: Not on file    Non-medical: Not on file  Tobacco Use  . Smoking status: Never Smoker  . Smokeless tobacco: Never Used  Substance and Sexual Activity  . Alcohol use: No  . Drug use: No  . Sexual activity: Not on file  Lifestyle  . Physical activity    Days per week: Not on file    Minutes per session: Not on file  . Stress: Not on file  Relationships  . Social Herbalist on phone: Not on file    Gets together: Not on file    Attends religious service: Not on file    Active member of club or organization: Not on file    Attends meetings of clubs or organizations: Not on file    Relationship status: Not on file  Other Topics Concern  . Not on file  Social History Narrative   Married   Tobacco   etoh     Family History: The patient's family history includes Arthritis/Rheumatoid in his mother.  ROS:   Please see the history of present illness.    Review of Systems  Constitutional: Negative for chills and fever.  HENT: Negative for congestion.   Respiratory: Negative for cough, hemoptysis, sputum production and shortness of breath.   Cardiovascular: Positive for leg swelling. Negative for chest pain, palpitations, orthopnea, claudication and PND.  Gastrointestinal: Negative for blood in stool.  Genitourinary: Negative for hematuria.  Musculoskeletal: Negative for myalgias.  Neurological: Negative for dizziness and loss of consciousness.  Endo/Heme/Allergies: Environmental allergies: brusises easily but no abnormal bleeding. Bruises/bleeds easily.  All other systems reviewed and are negative.  EKGs/Labs/Other Studies Reviewed:    The following studies were reviewed today:  Carotid Ultrasounds 02/18/2019: Summary: Right Carotid: Velocities in the right ICA are consistent with a 1-39% stenosis.                The ECA appears <50%  stenosed.  Left Carotid: Velocities in the left ICA are consistent with a 60-79% stenosis.               Non-hemodynamically significant plaque noted in the CCA. The ECA               appears <50% stenosed. Although velocities weren't duplicated from               prior exam, the appearance is consistent with 60-79% stenosis.  Vertebrals:  Left vertebral artery demonstrates antegrade flow. Right vertebral  artery demonstrates high resistant flow. Subclavians: Right subclavian artery flow was disturbed. Normal flow              hemodynamics were seen in the left subclavian artery.  EKG:  EKG is ordered today.  The ekg ordered today demonstrates normal sinus rhythm, rate 67 bpm, with no significant changes from prior tracings.   Recent Labs: No results found for requested labs within last 8760 hours.  Recent Lipid Panel    Component Value Date/Time   CHOL 135 06/29/2009 1230   TRIG 37.0 06/29/2009 1230   HDL 63.70 06/29/2009 1230   CHOLHDL 2 06/29/2009 1230   VLDL 7.4 06/29/2009 1230   LDLCALC 64 06/29/2009 1230    Physical Exam:    VS:  BP (!) 152/82   Pulse 67   Ht 5' 10.5" (1.791 m)   Wt 185 lb (83.9 kg)   SpO2 96%   BMI 26.17 kg/m     Wt Readings from Last 3 Encounters:  03/20/19 185 lb (83.9 kg)  09/09/18 180 lb 12.8 oz (82 kg)  03/20/18 185 lb 4 oz (84 kg)     GEN: Elderly Caucasian male sitting comfortably on exam table in no acute distress.  HEENT: Normal. NECK: Supple. Bilateral carotid bruits noted (left > right). No JVD. LYMPHATICS: No lymphadenopathy. CARDIAC: RRR. No murmurs, rubs, or gallops. RESPIRATORY: No increased work of breathing. Clear to auscultation bilaterally. No wheezes, rhonchi, or rales.   ABDOMEN: Soft, non-tender, non-distended. Bowel sounds present. MUSCULOSKELETAL: Mild tight lower extremity edema (left slightly greater than right) with hyperpigmentation of lower legs. Not warm to touch. SKIN: Warm and dry. NEUROLOGIC:   Alert and oriented x3. Bowel sounds presents.  PSYCHIATRIC:  Normal affect. Responds appropriately.   ASSESSMENT:    1. Coronary artery disease involving native coronary artery of native heart without angina pectoris   2. Bilateral carotid artery stenosis   3. SSS (sick sinus syndrome) ([place])   4. Essential hypertension   5. Hyperlipidemia, unspecified hyperlipidemia type   6. Lower extremity edema   7. Educated About Covid-19 Virus Infection    PLAN:    CAD - Patient has multivessel CAD s/p atherectomy and PCI to RCA in 2009. - Stable. No angina since last visit. - Continue current medical therapy: Aspirin 81mg  daily, Plavix 75mg  daily, Coreg 3.125mg  twice daily, and Lipitor 40mg  daily.  SSS s/p PPM - EKG shows today shows normal sinus rhythm with rate of 67 bpm.  - EP came and interrogated device today and stated everything was fine. - Will follow-up with Dr. Rayann Heman in 3 months.   Bilateral Carotid Stenosis - Recent carotid ultrasounds on 02/18/2019 showed right ICA with 1-39% stenosis and left ICA with 60-79% stenosis. Not hemodynamically significant. Stable from dopplers in 12/2017.  - Continue medical therapy. - Will repeat carotid ultrasounds in 1 year.   Lower Extremity Edema - Patient has mild bilateral lower extremity edema. Left slightly greater than right but states this has been the case for the last 2 years after he hit his left leg on a chair. Hyperpigmentation of bilateral lower extremities form mid shin and down noted on exam. Not warm to the touch. He states this is not new. Patient is very active and denies any pain in his leg so do not suspect DVT. Suspect this is secondary to chronic venous insufficiency. Patient takes Lasix 40mg  once weekly for the edema. Patient to call us if he feels like he needs to take this more  frequently  Hypertension - BP 152/82 in the office. Patient states he has "white coat syndrome." He checked his BP earlier this morning and it was  132/62. He states systolic BP normally ranges between 125 and 135.  - Continue current regiment: Amlodipine 5mg  daily, Coreg 3.125mg  twice daily, and Spironolactone 25mg  daily.  Hyperlipidemia - Recent lipid panel from PCP's office on 02/28/2019: Total Cholesterol 131, Triglycerides 59, HDL 50, LDL 69. - Goal LDL <70 given CAD. - Continue Lipitor 40mg  daily.  Solitary Kidney - Creatinine stable at 1.14 on recent labs from PCP's office. Will have lab scanned into system.  COVID-19 Education - Patient denies any symptoms concerning for COVID-19. He is practicing socially distancing, washing his hands frequently, and wearing a mask. Encouraged him to continue to do these things.   Disposition: Follow-up with Dr. Harrington Challenger in 6 months.    Medication Adjustments/Labs and Tests Ordered: Current medicines are reviewed at length with the patient today.  Concerns regarding medicines are outlined above.  Orders Placed This Encounter  Procedures  . EKG 12-Lead   No orders of the defined types were placed in this encounter.   Patient Instructions  Medication Instructions:  Your physician recommends that you continue on your current medications as directed. Please refer to the Current Medication list given to you today.   If you need a refill on your cardiac medications before your next appointment, please call your pharmacy.   Lab work: NONE ORDERED  TODAY   If you have labs (blood work) drawn today and your tests are completely normal, you will receive your results only by: Marland Kitchen MyChart Message (if you have MyChart) OR . A paper copy in the mail If you have any lab test that is abnormal or we need to change your treatment, we will call you to review the results.  Testing/Procedures: NONE ORDERED  TODAY   Follow-Up: At [facility] [place] [facility], you and your health needs are our priority.  As part of our continuing mission to provide you with exceptional heart care, we have created designated Provider  Care Teams.  These Care Teams include your primary Cardiologist (physician) and Advanced Practice Providers (APPs -  Physician Assistants and Nurse Practitioners) who all work together to provide you with the care you need, when you need it. You will need a follow up appointment in:  6 months.  Please call our office 2 months in advance to schedule this appointment.  You may see Dorris Carnes, MD or one of the following Advanced Practice Providers on your designated Care Team: Richardson Dopp, PA-C [place], [place] . Daune Perch, NP  Your physician wants you to follow-up in:  IN  3  MONTHS WITH DR Rayann Heman     Any Other Special Instructions Will Be Listed Below (If Applicable).       Signed, Darreld Mclean, PA-C  03/20/2019 2:28 PM    [place]

## 2019-03-20 NOTE — Patient Instructions (Signed)
Medication Instructions:  Your physician recommends that you continue on your current medications as directed. Please refer to the Current Medication list given to you today.   If you need a refill on your cardiac medications before your next appointment, please call your pharmacy.   Lab work: NONE ORDERED  TODAY   If you have labs (blood work) drawn today and your tests are completely normal, you will receive your results only by: Marland Kitchen MyChart Message (if you have MyChart) OR . A paper copy in the mail If you have any lab test that is abnormal or we need to change your treatment, we will call you to review the results.  Testing/Procedures: NONE ORDERED  TODAY   Follow-Up: At [facility], you and your health needs are our priority.  As part of our continuing mission to provide you with exceptional heart care, we have created designated Provider Care Teams.  These Care Teams include your primary Cardiologist (physician) and Advanced Practice Providers (APPs -  Physician Assistants and Nurse Practitioners) who all work together to provide you with the care you need, when you need it. You will need a follow up appointment in:  6 months.  Please call our office 2 months in advance to schedule this appointment.  You may see Dorris Carnes, MD or one of the following Advanced Practice Providers on your designated Care Team: Richardson Dopp, PA-C [place], [place] . Daune Perch, NP  Your physician wants you to follow-up in:  IN  3  MONTHS WITH DR Rayann Heman     Any Other Special Instructions Will Be Listed Below (If Applicable).

## 2019-03-20 NOTE — Progress Notes (Signed)
Pacemaker check in clinic. Normal device function. Thresholds, sensing, impedances consistent with previous measurements. Device programmed to maximize longevity. No mode switch or high ventricular rates noted. Device programmed at appropriate safety margins. Histogram distribution appropriate for patient activity level. Device programmed to optimize intrinsic conduction. Estimated longevity 2.5 - 5.25 years. Patient does not do remote follow up. Plan to follow 3 months with Dr. Rayann Heman as he is overdue for visit.    Legrand Como [address]" [place], PA-C 03/20/2019 2:13 PM

## 2019-04-21 ENCOUNTER — Encounter

## 2019-05-01 DIAGNOSIS — Z23 Encounter for immunization: Secondary | ICD-10-CM | POA: Diagnosis not present

## 2019-06-03 ENCOUNTER — Other Ambulatory Visit: Payer: Self-pay | Admitting: Internal Medicine

## 2019-06-03 DIAGNOSIS — N400 Enlarged prostate without lower urinary tract symptoms: Secondary | ICD-10-CM | POA: Diagnosis not present

## 2019-06-03 DIAGNOSIS — I10 Essential (primary) hypertension: Secondary | ICD-10-CM | POA: Diagnosis not present

## 2019-06-03 DIAGNOSIS — E7800 Pure hypercholesterolemia, unspecified: Secondary | ICD-10-CM | POA: Diagnosis not present

## 2019-06-03 MED ORDER — CARVEDILOL 3.125 MG PO TABS: 3.1250 mg | ORAL_TABLET | Freq: Two times a day (BID) | ORAL | 3 refills | Status: DC

## 2019-06-03 MED ORDER — FUROSEMIDE 40 MG PO TABS: 40.0000 mg | ORAL_TABLET | ORAL | 3 refills | Status: DC

## 2019-06-03 MED ORDER — ATORVASTATIN CALCIUM 40 MG PO TABS: ORAL_TABLET | ORAL | 3 refills | Status: DC

## 2019-06-03 MED ORDER — AMLODIPINE BESYLATE 5 MG PO TABS: 5.0000 mg | ORAL_TABLET | Freq: Every day | ORAL | 3 refills | Status: DC

## 2019-06-03 MED ORDER — SPIRONOLACTONE 25 MG PO TABS: 12.5000 mg | ORAL_TABLET | Freq: Every day | ORAL | 3 refills | Status: DC

## 2019-06-03 MED ORDER — CLOPIDOGREL BISULFATE 75 MG PO TABS: 75.0000 mg | ORAL_TABLET | Freq: Every day | ORAL | 3 refills | Status: DC

## 2019-06-03 MED ORDER — POTASSIUM CHLORIDE ER 10 MEQ PO TBCR: 10.0000 meq | EXTENDED_RELEASE_TABLET | ORAL | 3 refills | Status: DC

## 2019-06-03 NOTE — Telephone Encounter (Signed)
Pt's medications were sent to pt's pharmacy as requested. Confirmation received.  

## 2019-06-03 NOTE — Telephone Encounter (Signed)
°*  STAT* If patient is at the pharmacy, call can be transferred to refill team.   1. Which medications need to be refilled? (please list name of each medication and dose if known)   A new prescription for Amlodipine, Atorvastatin, Carvedilol, Clopidogrel, Potassium , Furosemide and Spironolactone  2. Which pharmacy/location (including street and city if local pharmacy) is medication to be sent to? [facility]- [phone]  3. Do they need a 30 day or 90 day supply? 90 days and refills.

## 2019-06-17 ENCOUNTER — Emergency Department (HOSPITAL_COMMUNITY): Payer: Medicare Other

## 2019-06-17 ENCOUNTER — Encounter (HOSPITAL_COMMUNITY): Payer: Self-pay | Admitting: Student

## 2019-06-17 ENCOUNTER — Emergency Department (HOSPITAL_COMMUNITY)
Admission: EM | Admit: 2019-06-17 | Discharge: 2019-06-17 | Disposition: A | Discharge status: Home / Self Care | Payer: Medicare Other | Attending: Emergency Medicine | Admitting: Emergency Medicine

## 2019-06-17 ENCOUNTER — Other Ambulatory Visit: Payer: Self-pay

## 2019-06-17 DIAGNOSIS — S41101A Unspecified open wound of right upper arm, initial encounter: Secondary | ICD-10-CM | POA: Insufficient documentation

## 2019-06-17 DIAGNOSIS — Z7982 Long term (current) use of aspirin: Secondary | ICD-10-CM | POA: Insufficient documentation

## 2019-06-17 DIAGNOSIS — I10 Essential (primary) hypertension: Secondary | ICD-10-CM | POA: Diagnosis not present

## 2019-06-17 DIAGNOSIS — Y999 Unspecified external cause status: Secondary | ICD-10-CM | POA: Diagnosis not present

## 2019-06-17 DIAGNOSIS — Z23 Encounter for immunization: Secondary | ICD-10-CM | POA: Diagnosis not present

## 2019-06-17 DIAGNOSIS — Z7902 Long term (current) use of antithrombotics/antiplatelets: Secondary | ICD-10-CM | POA: Diagnosis not present

## 2019-06-17 DIAGNOSIS — W19XXXA Unspecified fall, initial encounter: Secondary | ICD-10-CM | POA: Diagnosis not present

## 2019-06-17 DIAGNOSIS — S4991XA Unspecified injury of right shoulder and upper arm, initial encounter: Secondary | ICD-10-CM | POA: Diagnosis present

## 2019-06-17 DIAGNOSIS — S42351A Displaced comminuted fracture of shaft of humerus, right arm, initial encounter for closed fracture: Secondary | ICD-10-CM

## 2019-06-17 DIAGNOSIS — M25511 Pain in right shoulder: Secondary | ICD-10-CM | POA: Diagnosis not present

## 2019-06-17 DIAGNOSIS — Z950 Presence of cardiac pacemaker: Secondary | ICD-10-CM | POA: Insufficient documentation

## 2019-06-17 DIAGNOSIS — Z79899 Other long term (current) drug therapy: Secondary | ICD-10-CM | POA: Diagnosis not present

## 2019-06-17 DIAGNOSIS — R52 Pain, unspecified: Secondary | ICD-10-CM | POA: Diagnosis not present

## 2019-06-17 DIAGNOSIS — I2510 Atherosclerotic heart disease of native coronary artery without angina pectoris: Secondary | ICD-10-CM | POA: Insufficient documentation

## 2019-06-17 DIAGNOSIS — S42491A Other displaced fracture of lower end of right humerus, initial encounter for closed fracture: Secondary | ICD-10-CM | POA: Diagnosis not present

## 2019-06-17 DIAGNOSIS — Y9289 Other specified places as the place of occurrence of the external cause: Secondary | ICD-10-CM | POA: Insufficient documentation

## 2019-06-17 DIAGNOSIS — S51002A Unspecified open wound of left elbow, initial encounter: Secondary | ICD-10-CM | POA: Diagnosis not present

## 2019-06-17 DIAGNOSIS — W109XXA Fall (on) (from) unspecified stairs and steps, initial encounter: Secondary | ICD-10-CM | POA: Insufficient documentation

## 2019-06-17 DIAGNOSIS — S42331A Displaced oblique fracture of shaft of humerus, right arm, initial encounter for closed fracture: Secondary | ICD-10-CM | POA: Diagnosis not present

## 2019-06-17 DIAGNOSIS — Y9301 Activity, walking, marching and hiking: Secondary | ICD-10-CM | POA: Insufficient documentation

## 2019-06-17 MED ORDER — FENTANYL CITRATE (PF) 100 MCG/2ML IJ SOLN: 50.0000 ug | Freq: Once | INTRAMUSCULAR | Status: DC

## 2019-06-17 MED ORDER — OXYCODONE-ACETAMINOPHEN 5-325 MG PO TABS: 1.0000 | ORAL_TABLET | Freq: Four times a day (QID) | ORAL | 0 refills | Status: DC | PRN

## 2019-06-17 MED ORDER — TETANUS-DIPHTH-ACELL PERTUSSIS 5-2.5-18.5 LF-MCG/0.5 IM SUSP
0.5000 mL | Freq: Once | INTRAMUSCULAR | Status: AC
  Administered 2019-06-17: 0.5 mL via INTRAMUSCULAR
  Filled 2019-06-17: qty 0.5

## 2019-06-17 MED ORDER — OXYCODONE-ACETAMINOPHEN 5-325 MG PO TABS
1.0000 | ORAL_TABLET | Freq: Once | ORAL | Status: AC
  Administered 2019-06-17: 1 via ORAL
  Filled 2019-06-17: qty 1

## 2019-06-17 NOTE — ED Triage Notes (Signed)
Per EMS, patient was outside watering yard, tripped over sprinkler, fell on right arm. Right arm has obvious deformity. Skin tear to left elbow. Did not hit head. Denies LOC. Currently takes plavix. EMS admin. 166mcg fentanyl in route via 20g in left ac.

## 2019-06-17 NOTE — Discharge Instructions (Addendum)
Please read and follow all provided instructions.  You have been seen today for a fall with injury to the right arm.  Your x-rays show that your distal humerus (bone in the upper arm) is fractured.  We have placed you into a splint and immobilizer for the fracture.  Please remain in the splint/immobilizer at all times until you follow-up with orthopedics.  Keep the splint clean and dry.  Please call Dr. Shelbie Ammons office for an appointment within 1 week, please call tomorrow morning to schedule this.  Home care instructions: -- *PRICE in the first 24-48 hours after injury: Protect (with brace, splint, sling), if given by your provider Rest Ice- Do not apply ice pack directly to your skin, place towel or similar between your skin and ice/ice pack. Apply ice for 20 min, then remove for 40 min while awake Compression- Wear brace, elastic bandage, splint as directed by your provider Elevate affected extremity above the level of your heart when not walking around for the first 24-48 hours   Medications:  -Percocet-this is a narcotic/controlled substance medication that has potential addicting qualities.  We recommend that you take  1 tablet every 6 hours as needed for severe pain.  Do not drive or operate heavy machinery when taking this medicine as it can be sedating. Do not drink alcohol or take other sedating medications when taking this medicine for safety reasons.  Keep this out of reach of small children.  Please be aware this medicine has Tylenol in it (325 mg/tab) do not exceed the maximum dose of Tylenol in a day per over the counter recommendations should you decide to supplement with Tylenol over the counter.    We have prescribed you new medication(s) today. Discuss the medications prescribed today with your pharmacist as they can have adverse effects and interactions with your other medicines including over the counter and prescribed medications. Seek medical evaluation if you start to  experience new or abnormal symptoms after taking one of these medicines, seek care immediately if you start to experience difficulty breathing, feeling of your throat closing, facial swelling, or rash as these could be indications of a more serious allergic reaction   Follow-up instructions: Please follow up with Dr. Jeannie Fend within 1 week.   Return instructions:  Please return if your digits or extremity are numb or tingling, appear gray or blue, or you have severe pain (also elevate the extremity and loosen splint or wrap if you were given one) Please return if you have redness or fevers.  Please return to the Emergency Department if you experience worsening symptoms.  Please return if you have any other emergent concerns. Additional Information:  Your vital signs today were: BP (!) 161/77    Pulse 78    Temp 98 F (36.7 C)    Resp 18    Ht 5\' 10"  (1.778 m)    Wt 81.6 kg    SpO2 95%    BMI 25.83 kg/m  If your blood pressure (BP) was elevated above 135/85 this visit, please have this repeated by your doctor within one month. ---------------

## 2019-06-17 NOTE — ED Notes (Signed)
An After Visit Summary was printed and given to the patient. Discharge instructions given and no further questions at this time. Pt left in vehicle (RN assisted pt to vehicle, pt ambulatory) with neighbors who are giving him ride home per patient.

## 2019-06-17 NOTE — ED Provider Notes (Signed)
[facility] DEPT Provider Note   CSN: [id number] Arrival date & time: 06/17/19  1636     History   Chief Complaint Chief Complaint  Patient presents with   Fall   Arm Injury    right humerus     HPI [name] is a 85 y.o. male with a history of CAD- currently on plavix/ASA, HTN, dyslipidemia, & seizures who presents to the ED via EMS status post mechanical fall shortly PTA with complaints of right upper arm pain.  Patient states that he was walking back from his neighbors house when he missed stepped, tripped, and fell onto the right elbow/upper arm.  He denies head injury or loss of consciousness.  Denies prodromal lightheadedness, dizziness, chest pain, or dyspnea, states he simply tripped.  He was able to get up and has ambulated since the fall.  He states he has pain specifically to the right distal upper arm that is currently a 7 out of 10, worse with attempts to move the elbow/shoulder, alleviated some with fentanyl provided by EMS.  He denies any numbness, tingling, or weakness.  Denies headache, neck pain, back pain, chest pain, or abdominal pain.  Patient is right-hand dominant.  He states he has wounds to the right upper arm to the left elbow, unknown last tetanus.     HPI  Past Medical History:  Diagnosis Date   Coronary artery disease    PCI RCA 2009   Dyslipidemia    Hypertension    Scleritis, unspecified    treated with prednisone   Seizure ([place])    felt due to cyclosporin in setting of electrolyte disorder   Solitary kidney    Symptomatic bradycardia    a. s/p STJ dual chamber pacemaker    Patient Active Problem List   Diagnosis Date Noted   Carotid artery disease ([place]) 09/09/2018   Pacemaker-[employer] 07/03/2012   Bruit 09/28/2011   Tongue lesion 08/15/2011   HYPERTENSION, BENIGN 06/23/2010   Hyperlipidemia LDL goal <70 09/22/2008   CAD, NATIVE VESSEL 09/22/2008   Edema 09/22/2008   SSS (sick sinus  syndrome) ([place]) 09/22/2008    Past Surgical History:  Procedure Laterality Date   PACEMAKER INSERTION  2009   STJ dual chamber pacemaker implanted by Dr Olevia Perches for symptomatic bradycardia        Home Medications    Prior to Admission medications   Medication Sig Start Date End Date Taking? Authorizing Provider  amLODipine (NORVASC) 5 MG tablet Take 1 tablet (5 mg total) by mouth daily. 06/03/19  Yes Fay Records, MD  aspirin 81 MG tablet Take 81 mg by mouth daily.  07/19/11  Yes Allred, Jeneen Rinks, MD  atorvastatin (LIPITOR) 40 MG tablet TAKE 1 TABLET BY MOUTH ONCE DAILY AT  6PM Patient taking differently: Take 40 mg by mouth daily at 6 PM.  06/03/19  Yes Fay Records, MD  beta carotene w/minerals (OCUVITE) tablet Take 1 tablet by mouth daily.   Yes [provider]  carvedilol (COREG) 3.125 MG tablet Take 1 tablet (3.125 mg total) by mouth 2 (two) times daily. 06/03/19  Yes Fay Records, MD  clopidogrel (PLAVIX) 75 MG tablet Take 1 tablet (75 mg total) by mouth daily. Patient taking differently: Take 75 mg by mouth at bedtime.  06/03/19  Yes Fay Records, MD  finasteride (PROSCAR) 5 MG tablet Take 5 mg by mouth daily.   Yes [provider]  furosemide (LASIX) 40 MG tablet Take 1 tablet (  40 mg total) by mouth once a week. 06/03/19  Yes Fay Records, MD  metroNIDAZOLE (METROCREAM) 0.75 % cream Apply 1 application topically 2 (two) times daily as needed (skin).    Yes [provider]  Multiple Vitamins-Minerals (CENTRUM SILVER PO) Take 1 capsule by mouth daily.    Yes [provider]  NITROSTAT 0.4 MG SL tablet DISSOLVE ONE TABLET UNDER THE TONGUE EVERY 5 MINUTES AS NEEDED FOR CHEST PAIN.  DO NOT EXCEED A TOTAL OF 3 DOSES IN 15 MINUTES Patient taking differently: Place 0.4 mg under the tongue every 5 (five) minutes as needed for chest pain.  11/19/18  Yes Fay Records, MD  potassium chloride (K-DUR) 10 MEQ tablet Take 1 tablet (10 mEq total) by mouth once a  week. Take with furosemide 06/03/19  Yes Fay Records, MD  spironolactone (ALDACTONE) 25 MG tablet Take 0.5 tablets (12.5 mg total) by mouth daily. 06/03/19  Yes Fay Records, MD  temazepam (RESTORIL) 15 MG capsule Take 15 mg by mouth 2 (two) times daily.    Yes [provider]    Family History Family History  Problem Relation Age of Onset   Arthritis/Rheumatoid Mother     Social History Social History   Tobacco Use   Smoking status: Never Smoker   Smokeless tobacco: Never Used  Substance Use Topics   Alcohol use: No   Drug use: No     Allergies   Cyclosporine, Maxzide [triamterene-hctz], Sulfur, and Prednisone   Review of Systems Review of Systems  Constitutional: Negative for chills and fever.  Respiratory: Negative for shortness of breath.   Cardiovascular: Negative for chest pain.  Gastrointestinal: Negative for vomiting.  Musculoskeletal: Positive for myalgias. Negative for back pain and neck pain.  Skin: Positive for wound.  Neurological: Negative for dizziness, weakness, light-headedness, numbness and headaches.  All other systems reviewed and are negative.    Physical Exam Updated Vital Signs BP (!) 169/75    Pulse 65    Temp 98 F (36.7 C)    Resp 16    Ht 5\' 10"  (1.778 m)    Wt 81.6 kg    SpO2 96%    BMI 25.83 kg/m   Physical Exam Vitals signs and nursing note reviewed.  Constitutional:      General: He is not in acute distress.    Appearance: He is well-developed. He is not toxic-appearing.  HENT:     Head: Normocephalic and atraumatic.     Comments: No raccoon eyes or battle sign.    Ears:     Comments: No hemotympanum. Eyes:     General:        Right eye: No discharge.        Left eye: No discharge.     Extraocular Movements: Extraocular movements intact.     Conjunctiva/sclera: Conjunctivae normal.     Pupils: Pupils are equal, round, and reactive to light.  Neck:     Musculoskeletal: Normal range of motion and neck supple.       Comments: No midline spinal tenderness. Cardiovascular:     Rate and Rhythm: Normal rate and regular rhythm.     Comments: 2+ symmetric radial pulses. Pulmonary:     Effort: Pulmonary effort is normal. No respiratory distress.     Breath sounds: Normal breath sounds. No wheezing, rhonchi or rales.  Chest:     Chest wall: No tenderness.  Abdominal:     General: There is no distension.  Palpations: Abdomen is soft.     Tenderness: There is no abdominal tenderness. There is no guarding or rebound.  Musculoskeletal:     Comments: Upper extremities: Patient has a skin tear to the posterior aspect of the left elbow that is V-shaped, no active bleeding, no appreciable foreign bodies.  Patient has an additional skin tear to the distal one third of the upper right arm without active bleeding or appreciable foreign bodies.  Patient has intact active range of motion throughout the left upper extremity.  He has intact active range of motion throughout the wrist and all digits of the right upper extremity.  He is able to minimally move the right elbow/shoulder secondary to pain in his right upper arm, able to supinate/pronate fairly well.  Patient is tender to palpation to the distal one third of the right humerus.  His upper extremities are otherwise relatively nontender.  Neurovascularly intact distally. Back: No midline spinal tenderness. Lower extremities: Normal intact active range of motion without point/focal bony tenderness.  Skin:    General: Skin is warm and dry.     Findings: No rash.  Neurological:     Mental Status: He is alert.     Comments: Clear speech.  5 out of 5 symmetric grip strength.  Sensation grossly intact bilateral upper and lower extremities.  Able to perform okay sign, thumbs up, cross second/third digits bilaterally.  5 out of 5 strength plantar dorsiflexion bilaterally.  Able to lift both legs off the bed without difficulty  Psychiatric:        Behavior: Behavior  normal.      ED Treatments / Results  Labs (all labs ordered are listed, but only abnormal results are displayed) Labs Reviewed - No data to display  EKG None  Radiology Dg Shoulder Right  Result Date: 06/17/2019 CLINICAL DATA:  Acute RIGHT shoulder pain following fall. Initial encounter. EXAM: RIGHT SHOULDER - 2+ VIEW COMPARISON:  None. FINDINGS: There is no evidence of fracture or dislocation. There is no evidence of arthropathy or other focal bone abnormality. Soft tissues are unremarkable. IMPRESSION: Negative. Electronically Signed   By: Margarette Canada M.D.   On: 06/17/2019 18:13   Dg Elbow 2 Views Right  Result Date: 06/17/2019 CLINICAL DATA:  Acute RIGHT elbow pain following fall. Initial encounter. EXAM: RIGHT ELBOW - 2 VIEW COMPARISON:  None. FINDINGS: An oblique comminuted fracture of the distal humeral diaphysis is noted with 1 cm posterior displacement. No other fracture or dislocation. IMPRESSION: Oblique comminuted fracture of the distal humeral diaphysis with 1 cm posterior displacement. Electronically Signed   By: Margarette Canada M.D.   On: 06/17/2019 18:14   Dg Humerus Right  Result Date: 06/17/2019 CLINICAL DATA:  Acute RIGHT shoulder pain following fall today. Initial encounter. EXAM: RIGHT HUMERUS - 2+ VIEW COMPARISON:  None. FINDINGS: An oblique comminuted fracture of the distal humeral diaphysis is noted with 1 cm posterior displacement. No other definite acute fracture noted. No dislocation noted. IMPRESSION: Oblique comminuted fracture of the distal humerus with 1 cm posterior displacement. Electronically Signed   By: Margarette Canada M.D.   On: 06/17/2019 18:12    Procedures Procedures (including critical care time)  SPLINT APPLICATION Date/Time: [id number] PM Authorized by: Kennith Maes Consent: Verbal consent obtained. Risks and benefits: risks, benefits and alternatives were discussed Consent given by: patient Splint applied by: orthopedic technician Location  details: RUE Splint type: posterior long arm splint w/ sling.  Post-procedure: The splinted body part was neurovascularly unchanged  following the procedure. Patient tolerance: Patient tolerated the procedure well with no immediate complications.   Medications Ordered in ED Medications  fentaNYL (SUBLIMAZE) injection 50 mcg (has no administration in time range)  Tdap (BOOSTRIX) injection 0.5 mL (has no administration in time range)     Initial Impression / Assessment and Plan / ED Course  I have reviewed the triage vital signs and the nursing notes.  Pertinent labs & imaging results that were available during my care of the patient were reviewed by me and considered in my medical decision making (see chart for details).   Patient presents to the emergency department status post mechanical fall with complaints of right distal upper arm pain.  Patient is nontoxic-appearing, resting fairly comfortably, vitals WNL with the exception of elevated blood pressure, doubt HTN emergency.  On exam patient has skin tears to bilateral upper extremities without active bleeding or appreciable foreign bodies, neither of which appear to require repair with sutures, staples, or skin adhesive.  Wound care per nursing staff.  Tdap updated.  Appears to have isolated injury to the right upper extremity.  Denies head injury, loss of consciousness, no midline spinal tenderness, no focal neurologic deficits.  No chest or abdominal tenderness.  Right upper extremity x-rays reveal a oblique comminuted fracture of the distal humeral diaphysis with 1 cm posterior displacement--> neurovascularly intact distally with 2+ symmetric radial pulses, 5 out of 5 symmetric grip strength, able to perform OK sign, thumbs up, and cross 2nd/3rd digits, able to actively flex/extend the wrist. Skin tear is superficial, not consistent w/ open fracture. Plan to discuss w/ orthopedic surgery on call.   18:55: CONSULT: Discussed with orthopedic  surgeon Dr. Lyla Glassing- recommendation for posterior splint, follow up with Dr. Jeannie Fend of [facility] within 1 week.   Plan carried out as discussed.  Will discharge home with short course of Percocet for severe pain. I discussed results, treatment plan, need for follow-up, and return precautions with the patient & his neighbor @ bedside. Provided opportunity for questions, patient & his neighbor confirmed understanding and are in agreement with plan.   Findings and plan of care discussed with supervising physician Dr. Roslynn Amble who has evaluated patient and is in agreement.   Final Clinical Impressions(s) / ED Diagnoses   Final diagnoses:  Fall, initial encounter  Closed displaced comminuted fracture of shaft of right humerus, initial encounter    ED Discharge Orders         Ordered    oxyCODONE-acetaminophen (PERCOCET/ROXICET) 5-325 MG tablet  Every 6 hours PRN     06/17/19 2036           Amaryllis Dyke, PA-C 06/17/19 2038    Lucrezia Starch, MD 06/18/19 [phone]

## 2019-06-17 NOTE — ED Notes (Signed)
Sterile strips and dry dressing applied to skin tear to R upper arm that patient came in with.

## 2019-06-19 DIAGNOSIS — I10 Essential (primary) hypertension: Secondary | ICD-10-CM | POA: Diagnosis not present

## 2019-06-19 DIAGNOSIS — E7800 Pure hypercholesterolemia, unspecified: Secondary | ICD-10-CM | POA: Diagnosis not present

## 2019-06-19 DIAGNOSIS — N400 Enlarged prostate without lower urinary tract symptoms: Secondary | ICD-10-CM | POA: Diagnosis not present

## 2019-06-21 ENCOUNTER — Other Ambulatory Visit: Payer: Self-pay | Admitting: Internal Medicine

## 2019-06-23 NOTE — Telephone Encounter (Signed)
Outpatient Medication Detail   Disp Refills Start End   atorvastatin (LIPITOR) 40 MG tablet 90 tablet 3 06/03/2019    Sig: TAKE 1 TABLET BY MOUTH ONCE DAILY AT 6PM   Patient taking differently: Take 40 mg by mouth daily at 6 PM.        Sent to pharmacy as: atorvastatin (LIPITOR) 40 MG tablet   E-Prescribing Status: Receipt confirmed by pharmacy (06/03/2019 12:06 PM EDT)   [place], [place] [address]

## 2019-06-23 NOTE — Telephone Encounter (Signed)
Outpatient Medication Detail   Disp Refills Start End   atorvastatin (LIPITOR) 40 MG tablet 90 tablet 3 06/03/2019    Sig: TAKE 1 TABLET BY MOUTH ONCE DAILY AT 6PM   Patient taking differently: Take 40 mg by mouth daily at 6 PM.        Sent to pharmacy as: atorvastatin (LIPITOR) 40 MG tablet   E-Prescribing Status: Receipt confirmed by pharmacy (06/03/2019 12:06 PM EDT)   [place], [place] [address]

## 2019-06-24 ENCOUNTER — Other Ambulatory Visit: Payer: Self-pay | Admitting: Internal Medicine

## 2019-06-24 DIAGNOSIS — M25521 Pain in right elbow: Secondary | ICD-10-CM | POA: Diagnosis not present

## 2019-06-24 DIAGNOSIS — S42411A Displaced simple supracondylar fracture without intercondylar fracture of right humerus, initial encounter for closed fracture: Secondary | ICD-10-CM | POA: Diagnosis not present

## 2019-06-24 MED ORDER — ATORVASTATIN CALCIUM 40 MG PO TABS: ORAL_TABLET | ORAL | 2 refills | Status: DC

## 2019-06-25 ENCOUNTER — Telehealth: Payer: Self-pay | Admitting: *Deleted

## 2019-06-25 NOTE — Telephone Encounter (Signed)
   [facility] Pre-operative Risk Assessment    Request for surgical clearance:  1. What type of surgery is being performed? ORIF distal humerus fx   2. When is this surgery scheduled? 07/03/19   3. What type of clearance is required (medical clearance vs. Pharmacy clearance to hold med vs. Both)? medical  4. Are there any medications that need to be held prior to surgery and how long?   5. Practice name and name of physician performing surgery?   [facility]   Dr. Harlin Heys  6. What is your office phone number:  [phone]    9.   What is your office fax number:  [phone], Attn: [name]  8.   Anesthesia type (None, local, MAC, general) ? General   [name] 06/25/2019, 12:08 PM  _________________________________________________________________   (provider comments below)

## 2019-06-25 NOTE — Telephone Encounter (Signed)
   Primary Cardiologist: Dorris Carnes, MD  Chart reviewed as part of pre-operative protocol coverage. Given past medical history and time since last visit, based on ACC/AHA guidelines, [name] would be at acceptable risk for the planned procedure without further cardiovascular testing.   Pt was last seen 03/20/2019 in follow up and was noted to be doing very well from a cardiac perspective. He was walking 30-45 minutes per day on the treadmill without angina.  I will route this recommendation to the requesting party via Epic fax function and remove from pre-op pool.  Please call with questions.  Kathyrn Drown, NP 06/25/2019, 1:04 PM

## 2019-06-30 ENCOUNTER — Encounter: Payer: Self-pay | Admitting: Internal Medicine

## 2019-06-30 ENCOUNTER — Other Ambulatory Visit: Payer: Self-pay

## 2019-06-30 ENCOUNTER — Telehealth (INDEPENDENT_AMBULATORY_CARE_PROVIDER_SITE_OTHER): Payer: Medicare Other | Admitting: Internal Medicine

## 2019-06-30 VITALS — BP 140/64 | HR 70 | Ht 70.0 in | Wt 180.0 lb

## 2019-06-30 DIAGNOSIS — I2510 Atherosclerotic heart disease of native coronary artery without angina pectoris: Secondary | ICD-10-CM

## 2019-06-30 DIAGNOSIS — R600 Localized edema: Secondary | ICD-10-CM

## 2019-06-30 DIAGNOSIS — I495 Sick sinus syndrome: Secondary | ICD-10-CM

## 2019-06-30 DIAGNOSIS — I10 Essential (primary) hypertension: Secondary | ICD-10-CM | POA: Diagnosis not present

## 2019-06-30 DIAGNOSIS — I6523 Occlusion and stenosis of bilateral carotid arteries: Secondary | ICD-10-CM

## 2019-06-30 NOTE — Progress Notes (Signed)
Electrophysiology TeleHealth Note  Due to national recommendations of social distancing due to [place] 19, an audio telehealth visit is felt to be most appropriate for this patient at this time.  Verbal consent was obtained by me for the telehealth visit today.  The patient does not have capability for a virtual visit.  A phone visit is therefore required today.   Date:  06/30/2019   ID:  [name], DOB 1930-11-18, MRN [id number]  Location: patient's home  Provider location:  [facility]  Evaluation Performed: Follow-up visit  PCP:  Shirline Frees, MD   Electrophysiologist:  Dr Rayann Heman  Chief Complaint:  palpitations  History of Present Illness:    [name] is a 85 y.o. male who presents via telehealth conferencing today.  Since last being seen in our clinic, the patient reports doing very well.  He broke his arm a couple weeks ago after having a mechanical fall.  He stays active. Today, he denies symptoms of palpitations, chest pain, shortness of breath, dizziness, presyncope, or syncope.  His ankle edema has increased above baseline.  He thinks that this is due to reduced activity. The patient is otherwise without complaint today.    Past Medical History:  Diagnosis Date  . Coronary artery disease    PCI RCA 2009  . Dyslipidemia   . Hypertension   . Scleritis, unspecified    treated with prednisone  . Seizure [facility])    felt due to cyclosporin in setting of electrolyte disorder  . Solitary kidney   . Symptomatic bradycardia    a. s/p STJ dual chamber pacemaker    Past Surgical History:  Procedure Laterality Date  . PACEMAKER INSERTION  2009   STJ dual chamber pacemaker implanted by Dr Olevia Perches for symptomatic bradycardia    Current Outpatient Medications  Medication Sig Dispense Refill  . acetaminophen (TYLENOL) 325 MG tablet Take 650 mg by mouth every 6 (six) hours as needed for mild pain.    Marland Kitchen amLODipine (NORVASC) 5 MG tablet Take 1 tablet (5 mg total) by  mouth daily. 90 tablet 3  . aspirin 81 MG tablet Take 81 mg by mouth daily.     Marland Kitchen atorvastatin (LIPITOR) 40 MG tablet TAKE 1 TABLET BY MOUTH ONCE DAILY AT  6PM 90 tablet 2  . beta carotene w/minerals (OCUVITE) tablet Take 1 tablet by mouth daily.    . carvedilol (COREG) 3.125 MG tablet Take 1 tablet (3.125 mg total) by mouth 2 (two) times daily. 180 tablet 3  . clopidogrel (PLAVIX) 75 MG tablet Take 1 tablet (75 mg total) by mouth daily. (Patient taking differently: Take 75 mg by mouth at bedtime. ) 90 tablet 3  . finasteride (PROSCAR) 5 MG tablet Take 5 mg by mouth daily.    . furosemide (LASIX) 40 MG tablet Take 1 tablet (40 mg total) by mouth once a week. 15 tablet 3  . metroNIDAZOLE (METROCREAM) 0.75 % cream Apply 1 application topically 2 (two) times daily as needed (skin).     . Multiple Vitamins-Minerals (CENTRUM SILVER PO) Take 1 capsule by mouth daily.     Marland Kitchen NITROSTAT 0.4 MG SL tablet DISSOLVE ONE TABLET UNDER THE TONGUE EVERY 5 MINUTES AS NEEDED FOR CHEST PAIN.  DO NOT EXCEED A TOTAL OF 3 DOSES IN 15 MINUTES (Patient taking differently: Place 0.4 mg under the tongue every 5 (five) minutes as needed for chest pain. ) 75 tablet 2  . oxyCODONE-acetaminophen (PERCOCET/ROXICET) 5-325 MG tablet Take  1 tablet by mouth every 6 (six) hours as needed for severe pain. 12 tablet 0  . potassium chloride (K-DUR) 10 MEQ tablet Take 1 tablet (10 mEq total) by mouth once a week. Take with furosemide 15 tablet 3  . spironolactone (ALDACTONE) 25 MG tablet Take 0.5 tablets (12.5 mg total) by mouth daily. 45 tablet 3  . temazepam (RESTORIL) 15 MG capsule Take 15 mg by mouth 2 (two) times daily.      No current facility-administered medications for this visit.     Allergies:   Cyclosporine, Maxzide [triamterene-hctz], Sulfur, and Prednisone   Social History:  The patient  reports that he has never smoked. He has never used smokeless tobacco. He reports that he does not drink alcohol or use drugs.   Family  History:  The patient's family history includes Arthritis/Rheumatoid in his mother.   ROS:  Please see the history of present illness.   All other systems are personally reviewed and negative.    Exam:    Vital Signs:  BP 140/64   Pulse 70   Ht 5\' 10"  (1.778 m)   Wt 180 lb (81.6 kg)   BMI 25.83 kg/m   Well sounding, alert and conversant   Labs/Other Tests and Data Reviewed:    Recent Labs: No results found for requested labs within last 8760 hours.   Wt Readings from Last 3 Encounters:  06/30/19 180 lb (81.6 kg)  06/17/19 180 lb (81.6 kg)  03/20/19 185 lb (83.9 kg)     Last device interrogation is reviewed from [facility] which reveals normal device function, no arrhythmias    ASSESSMENT & PLAN:    1.  Sick sinus syndrome Pacemaker interrogation 03/2019 is reviewed 2 years left on his battery Return in 6 months  2. Edema Increase lasix to M, W, F x 2 weeks, then return to normal dosing  3. HTN Stable No change required today  4. CAD No ischemic symptoms No changes  Follow-up:  Device clinic in 6 months Return to see EP NP in a year Follow-up with Dr Harrington Challenger as scheduled   Patient Risk:  after full review of this patients clinical status, I feel that they are at moderate risk at this time.  Today, I have spent 15 minutes with the patient with telehealth technology discussing arrhythmia management .    Army Fossa, MD  06/30/2019 2:01 PM     [place] [address] [place]  [address] [phone] (office) [phone] (fax)

## 2019-07-01 ENCOUNTER — Other Ambulatory Visit (HOSPITAL_COMMUNITY)
Admission: RE | Admit: 2019-07-01 | Discharge: 2019-07-01 | Disposition: A | Discharge status: Home / Self Care | Payer: Medicare Other | Source: Ambulatory Visit | Attending: Orthopaedic Surgery | Admitting: Orthopaedic Surgery

## 2019-07-01 DIAGNOSIS — M25521 Pain in right elbow: Secondary | ICD-10-CM | POA: Diagnosis not present

## 2019-07-01 DIAGNOSIS — Z01812 Encounter for preprocedural laboratory examination: Secondary | ICD-10-CM | POA: Insufficient documentation

## 2019-07-01 DIAGNOSIS — S42411A Displaced simple supracondylar fracture without intercondylar fracture of right humerus, initial encounter for closed fracture: Secondary | ICD-10-CM | POA: Diagnosis not present

## 2019-07-01 DIAGNOSIS — Z20828 Contact with and (suspected) exposure to other viral communicable diseases: Secondary | ICD-10-CM | POA: Insufficient documentation

## 2019-07-01 LAB — SARS CORONAVIRUS 2 (TAT 6-24 HRS): SARS Coronavirus 2: NEGATIVE

## 2019-07-01 NOTE — Progress Notes (Signed)
[place], [place] [address] Phone: [phone] Fax: [phone]  [place], [place]. [place] [facility] [address] Phone: [phone] Fax: [phone]  [facility] - [place], [place] - [address] [place] [address] Phone: [phone] Fax: [phone]      Your procedure is scheduled on July 03, 2019.  Report to [facility] Main Entrance "A" at 1200 P.M., and check in at the Admitting office.  Call this number if you have problems the morning of surgery:  [phone]  Call [phone] if you have any questions prior to your surgery date Monday-Friday 8am-4pm    Remember:  Do not eat or drink after midnight the night before your surgery  You may drink clear liquids until 11:00 A.M. the morning of your surgery.   Clear liquids allowed are: Water, Non-Citrus Juices (without pulp), Carbonated Beverages, Clear Tea, Black Coffee Only, and Gatorade  Please complete your PRE-SURGERY ENSURE that was provided to you by 11:00 A.M. the morning of surgery.  Please, if able, drink it in one setting. DO NOT SIP.    Take these medicines the morning of surgery with A SIP OF WATER: amLODipine (NORVASC) carvedilol (COREG) finasteride (PROSCAR)  acetaminophen (TYLENOL) - if needed NITROSTAT - if needed oxyCODONE-acetaminophen (PERCOCET/ROXICET) - if needed  As of today, STOP taking any Aspirin (unless otherwise instructed by your surgeon), Aleve, Naproxen, Ibuprofen, Motrin, Advil, Goody's, BC's, all herbal medications, fish oil, and all vitamins.    The Morning of Surgery  Do not wear jewelry.  Do not wear lotions, powders, colognes, or deodorant   Men may shave face and neck.  Do not bring valuables to the hospital.  [facility] is not responsible for any belongings or  valuables.  If you are a smoker, DO NOT Smoke 24 hours prior to surgery IF you wear a CPAP at night please bring your mask, tubing, and machine the morning of surgery   Remember that you must have someone to transport you home after your surgery, and remain with you for 24 hours if you are discharged the same day.   Contacts, glasses, hearing aids, dentures or bridgework may not be worn into surgery.    Leave your suitcase in the car.  After surgery it may be brought to your room.  For patients admitted to the hospital, discharge time will be determined by your treatment team.  Patients discharged the day of surgery will not be allowed to drive home.    Special instructions:   [place]- Preparing For Surgery  Before surgery, you can play an important role. Because skin is not sterile, your skin needs to be as free of germs as possible. You can reduce the number of germs on your skin by washing with CHG (chlorahexidine gluconate) Soap before surgery.  CHG is an antiseptic cleaner which kills germs and bonds with the skin to continue killing germs even after washing.    Oral Hygiene is also important to reduce your risk of infection.  Remember - BRUSH YOUR TEETH THE MORNING OF SURGERY WITH YOUR REGULAR TOOTHPASTE  Please do not use if you have an allergy to CHG or antibacterial soaps. If your skin becomes reddened/irritated stop using the CHG.  Do not shave (including legs and underarms) for at least 48 hours prior to first CHG  shower. It is OK to shave your face.  Please follow these instructions carefully.   1. Shower the NIGHT BEFORE SURGERY and the MORNING OF SURGERY with CHG Soap.   2. If you chose to wash your hair, wash your hair first as usual with your normal shampoo.  3. After you shampoo, rinse your hair and body thoroughly to remove the shampoo.  4. Use CHG as you would any other liquid soap. You can apply CHG directly to the skin and wash gently with a scrungie or a  clean washcloth.   5. Apply the CHG Soap to your body ONLY FROM THE NECK DOWN.  Do not use on open wounds or open sores. Avoid contact with your eyes, ears, mouth and genitals (private parts). Wash Face and genitals (private parts)  with your normal soap.   6. Wash thoroughly, paying special attention to the area where your surgery will be performed.  7. Thoroughly rinse your body with warm water from the neck down.  8. DO NOT shower/wash with your normal soap after using and rinsing off the CHG Soap.  9. Pat yourself dry with a CLEAN TOWEL.  10. Wear CLEAN PAJAMAS to bed the night before surgery, wear comfortable clothes the morning of surgery  11. Place CLEAN SHEETS on your bed the night of your first shower and DO NOT SLEEP WITH PETS.    Day of Surgery:  Do not apply any deodorants/lotions. Please shower the morning of surgery with the CHG soap  Please wear clean clothes to the hospital/surgery center.   Remember to brush your teeth WITH YOUR REGULAR TOOTHPASTE.   Please read over the following fact sheets that you were given.

## 2019-07-02 ENCOUNTER — Other Ambulatory Visit: Payer: Self-pay

## 2019-07-02 ENCOUNTER — Encounter (HOSPITAL_COMMUNITY): Payer: Self-pay | Admitting: Vascular Surgery

## 2019-07-02 ENCOUNTER — Inpatient Hospital Stay (HOSPITAL_COMMUNITY)
Admission: RE | Admit: 2019-07-02 | Discharge: 2019-07-02 | Disposition: A | Discharge status: Home / Self Care | Payer: Medicare Other | Source: Ambulatory Visit

## 2019-07-02 NOTE — Progress Notes (Signed)
Anesthesia Chart Review: SAME DAY WORK-UP (PAT was scheduled for 07/02/19; PAT staff received call from Dr. Shelbie Ammons office that patient unable to make PAT visit so would need to be SDW)   Case: [id number] Date/Time: 07/03/19 1345   Procedure: Right distal humerus open reduction, internal fixation and surgery as indicated (Right ) - 111min   Anesthesia type: General   Pre-op diagnosis: Right distal humerus fracture   Location: [place] / [place] OR   Surgeon: Verner Mould, MD      DISCUSSION: Patient is an 85 year old male scheduled for the above procedure. He fell on 06/17/19 (missed a step) and sustained a right distal humerus fracture.  History includes never smoker, symptomatic bradycardia (s/p [employer] Jude PPM 07/11/08), CAD (s/p PCI RCA 07/17/08), HTN, dyslipidemia, peripheral ulcerative scleritis (2008, s/p prednisone), seizure (2008 felt due to cyclosporin in setting of electrolyte disorder, normal EEG), solitary kidney, carotid artery disease (123456 RICAS, [id number] LICAS [id number])..  Telemedicine visit with EP cardiologist Dr. Rayann Heman on 06/30/19. He felt patient "moderate risk at this time."  Primary cardiology input also outlined by Kathyrn Drown, NP on 06/25/19, stating, "Given past medical history and time since last visit, based on ACC/AHA guidelines, [name] would be at acceptable risk for the planned procedure without further cardiovascular testing.   Pt was last seen 03/20/2019 in follow up and was noted to be doing very well from a cardiac perspective. He was walking 30-45 minutes per day on the treadmill without angina."  Negative COVID-19 test 07/01/19. He will need updated labs on the day of surgery.   Reported he was to continue ASA for surgery, but last Plavix 07/01/19.  PAT RN has faxed St. Jude PPM perioperative device from to [facility]. Mickel Baas with [employer] notified of surgery plans per RN.)  Anesthesia team to evaluate day of  surgery.   PROVIDERS: Shirline Frees, MD is PCP Thompson Grayer, MD is EP cardiologist. Virtual visit on 06/30/19. 2 year left on PPM battery. Six month follow-up recommended.  Dorris Carnes, MD is primary cardiologist. Last visit 03/20/19 with Sande Rives, PA-C. Six month follow-up recommended.   LABS: For day of surgery.   IMAGES: DG right elbow/humerus 06/17/19: IMPRESSION: Oblique comminuted fracture of the distal humeral diaphysis with 1 cm posterior displacement.   EKG: 03/20/19: NSR   CV: Carotid US 02/18/19: Summary: - Right Carotid: Velocities in the right ICA are consistent with a 1-39% stenosis. The ECA appears <50% stenosed. - Left Carotid: Velocities in the left ICA are consistent with a 60-79% stenosis. Non-hemodynamically significant plaque noted in the CCA. The ECA appears <50% stenosed. Although velocities weren't duplicated from prior exam, the appearance is consistent with 60-79% stenosis. - Vertebrals:  Left vertebral artery demonstrates antegrade flow. Right vertebral artery demonstrates high resistant flow. - Subclavians: Right subclavian artery flow was disturbed. Normal flow hemodynamics were seen in the left subclavian artery. - Per Dr. Harrington Challenger, "Carotid artery plaquing is stable  Keep on same meds  Sched f/u in 1 year"   Echo 07/10/08: SUMMARY  - Overall left ventricular systolic function was normal. Left     ventricular ejection fraction was estimated , range being 60     % to 65 %.    Cardiac cath (with insertion of temporary transvenous pacer) 07/10/08: LM: Free of critical disease but does taper distally. LAD: Extensively calcified. Tandem lesions 70-80% after D2. 40-50% at D1. 60-70% ostial D2. LCX: 40% ostial. Diffuse segmental 80% OM1  with 30-40% of AV circumflex after the takeoff of OM1. RCA: Extensively calcified. 90-95% proximal to mid RCA. 40-60% distal RCA.  CONCLUSIONS:  1. Significant 3-vessel coronary artery disease with  significant      disease of the RCA.  2. Preserved left ventricular function.  3. Sinus arrest. - S/p St. Jude PPM Insertion 07/10/08 - S/p PCI 07/17/08: Rotational atherectomy and DES to the RCA   Past Medical History:  Diagnosis Date  . Carotid artery disease ([place])   . Coronary artery disease    PCI RCA 2009  . Dyslipidemia   . Hypertension   . Scleritis, unspecified    treated with prednisone  . Seizure [facility])    felt due to cyclosporin in setting of electrolyte disorder  . Solitary kidney   . Symptomatic bradycardia    a. s/p STJ dual chamber pacemaker    Past Surgical History:  Procedure Laterality Date  . PACEMAKER INSERTION  2009   STJ dual chamber pacemaker implanted by Dr Olevia Perches for symptomatic bradycardia    MEDICATIONS: No current facility-administered medications for this encounter.    Marland Kitchen acetaminophen (TYLENOL) 325 MG tablet  . amLODipine (NORVASC) 5 MG tablet  . aspirin 81 MG tablet  . atorvastatin (LIPITOR) 40 MG tablet  . beta carotene w/minerals (OCUVITE) tablet  . carvedilol (COREG) 3.125 MG tablet  . clopidogrel (PLAVIX) 75 MG tablet  . finasteride (PROSCAR) 5 MG tablet  . furosemide (LASIX) 40 MG tablet  . metroNIDAZOLE (METROCREAM) 0.75 % cream  . Multiple Vitamins-Minerals (CENTRUM SILVER PO)  . NITROSTAT 0.4 MG SL tablet  . oxyCODONE-acetaminophen (PERCOCET/ROXICET) 5-325 MG tablet  . potassium chloride (K-DUR) 10 MEQ tablet  . spironolactone (ALDACTONE) 25 MG tablet  . temazepam (RESTORIL) 15 MG capsule     Myra Gianotti, PA-C Surgical Short Stay/Anesthesiology [facility] Phone [phone] [facility] Phone [phone] 07/02/2019 4:14 PM

## 2019-07-02 NOTE — Anesthesia Preprocedure Evaluation (Addendum)
Anesthesia Evaluation  Patient identified by MRN, date of birth, ID band Patient awake    Reviewed: Allergy & Precautions, NPO status , Patient's Chart, lab work & pertinent test results  Airway Mallampati: I  TM Distance: >3 FB Neck ROM: Full    Dental   Pulmonary    Pulmonary exam normal        Cardiovascular hypertension, Pt. on medications + CAD  Normal cardiovascular exam+ pacemaker      Neuro/Psych    GI/Hepatic   Endo/Other    Renal/GU      Musculoskeletal   Abdominal   Peds  Hematology   Anesthesia Other Findings   Reproductive/Obstetrics                            Anesthesia Physical Anesthesia Plan  ASA: III  Anesthesia Plan: General   Post-op Pain Management:  Regional for Post-op pain   Induction: Intravenous  PONV Risk Score and Plan: 1 and Ondansetron  Airway Management Planned: Oral ETT  Additional Equipment:   Intra-op Plan:   Post-operative Plan: Extubation in OR  Informed Consent: I have reviewed the patients History and Physical, chart, labs and discussed the procedure including the risks, benefits and alternatives for the proposed anesthesia with the patient or authorized representative who has indicated his/her understanding and acceptance.       Plan Discussed with: CRNA and Surgeon  Anesthesia Plan Comments: (PAT note written 07/02/2019 by Myra Gianotti, PA-C. SAME DAY WORK-UP  St. Jude PPM  )      Anesthesia Quick Evaluation

## 2019-07-02 NOTE — Progress Notes (Signed)
PCP -  Cardiologist - Dorris Carnes Electro - [place]  PPM/ICD - yes Device Orders - faxed requested Stat Response Rep Notified - yes St Jude rep Mickel Baas) aware  Chest x-ray - not needed EKG - 03/20/19 Stress Test - denies ECHO - denies Cardiac Cath - > 10 years  Blood Thinner Instructions: LD plavix 10/13 Aspirin Instructions: patient was instructed to continue  ERAS Protcol - yes clears until 1030 PRE-SURGERY Ensure or G2- NO  COVID TEST- negative 10/13   Anesthesia review: yes  Patient denies shortness of breath, fever, cough and chest pain at PAT appointment   All instructions explained to the patient, with a verbal understanding of the material. Patient agrees to go over the instructions while at home for a better understanding. Patient also instructed to self quarantine after being tested for COVID-19. The opportunity to ask questions was provided.

## 2019-07-03 ENCOUNTER — Other Ambulatory Visit: Payer: Self-pay

## 2019-07-03 ENCOUNTER — Observation Stay (HOSPITAL_COMMUNITY)
Admission: RE | Admit: 2019-07-03 | Discharge: 2019-07-04 | Disposition: A | Discharge status: Home / Self Care | Payer: Medicare Other | Attending: Orthopaedic Surgery | Admitting: Orthopaedic Surgery

## 2019-07-03 ENCOUNTER — Encounter (HOSPITAL_COMMUNITY)
Admission: RE | Disposition: A | Discharge status: Home / Self Care | Payer: Self-pay | Source: Home / Self Care | Attending: Orthopaedic Surgery

## 2019-07-03 ENCOUNTER — Ambulatory Visit (HOSPITAL_COMMUNITY): Payer: Medicare Other | Admitting: Vascular Surgery

## 2019-07-03 ENCOUNTER — Encounter (HOSPITAL_COMMUNITY): Payer: Self-pay

## 2019-07-03 ENCOUNTER — Observation Stay (HOSPITAL_COMMUNITY): Payer: Medicare Other

## 2019-07-03 DIAGNOSIS — Z955 Presence of coronary angioplasty implant and graft: Secondary | ICD-10-CM | POA: Diagnosis not present

## 2019-07-03 DIAGNOSIS — Z79899 Other long term (current) drug therapy: Secondary | ICD-10-CM | POA: Insufficient documentation

## 2019-07-03 DIAGNOSIS — I2510 Atherosclerotic heart disease of native coronary artery without angina pectoris: Secondary | ICD-10-CM | POA: Insufficient documentation

## 2019-07-03 DIAGNOSIS — E785 Hyperlipidemia, unspecified: Secondary | ICD-10-CM | POA: Insufficient documentation

## 2019-07-03 DIAGNOSIS — M25521 Pain in right elbow: Secondary | ICD-10-CM | POA: Insufficient documentation

## 2019-07-03 DIAGNOSIS — H15009 Unspecified scleritis, unspecified eye: Secondary | ICD-10-CM | POA: Insufficient documentation

## 2019-07-03 DIAGNOSIS — Z888 Allergy status to other drugs, medicaments and biological substances status: Secondary | ICD-10-CM | POA: Diagnosis not present

## 2019-07-03 DIAGNOSIS — Z950 Presence of cardiac pacemaker: Secondary | ICD-10-CM | POA: Insufficient documentation

## 2019-07-03 DIAGNOSIS — S42401A Unspecified fracture of lower end of right humerus, initial encounter for closed fracture: Principal | ICD-10-CM | POA: Diagnosis present

## 2019-07-03 DIAGNOSIS — Q600 Renal agenesis, unilateral: Secondary | ICD-10-CM | POA: Diagnosis not present

## 2019-07-03 DIAGNOSIS — I10 Essential (primary) hypertension: Secondary | ICD-10-CM | POA: Insufficient documentation

## 2019-07-03 DIAGNOSIS — Z8261 Family history of arthritis: Secondary | ICD-10-CM | POA: Diagnosis not present

## 2019-07-03 DIAGNOSIS — Z882 Allergy status to sulfonamides status: Secondary | ICD-10-CM | POA: Insufficient documentation

## 2019-07-03 DIAGNOSIS — G8918 Other acute postprocedural pain: Secondary | ICD-10-CM | POA: Diagnosis not present

## 2019-07-03 DIAGNOSIS — Z7982 Long term (current) use of aspirin: Secondary | ICD-10-CM | POA: Insufficient documentation

## 2019-07-03 DIAGNOSIS — S42491A Other displaced fracture of lower end of right humerus, initial encounter for closed fracture: Secondary | ICD-10-CM | POA: Diagnosis not present

## 2019-07-03 DIAGNOSIS — W19XXXA Unspecified fall, initial encounter: Secondary | ICD-10-CM | POA: Diagnosis not present

## 2019-07-03 HISTORY — DX: Disorder of arteries and arterioles, unspecified: I77.9

## 2019-07-03 HISTORY — PX: ORIF HUMERUS FRACTURE: SHX2126

## 2019-07-03 LAB — BASIC METABOLIC PANEL
Anion gap: 10 (ref 5–15)
Anion gap: 11 (ref 5–15)
BUN: 20 mg/dL (ref 8–23)
BUN: 17 mg/dL (ref 8–23)
CO2: 23 mmol/L (ref 22–32)
CO2: 23 mmol/L (ref 22–32)
Calcium: 9.1 mg/dL (ref 8.9–10.3)
Calcium: 8.9 mg/dL (ref 8.9–10.3)
Chloride: 100 mmol/L (ref 98–111)
Chloride: 102 mmol/L (ref 98–111)
Creatinine, Ser: 1.01 mg/dL (ref 0.61–1.24)
Creatinine, Ser: 1.04 mg/dL (ref 0.61–1.24)
GFR calc Af Amer: >60 mL/min (ref >60)
GFR calc Af Amer: >60 mL/min (ref >60)
GFR calc non Af Amer: >60 mL/min (ref >60)
GFR calc non Af Amer: >60 mL/min (ref >60)
Glucose, Bld: 99 mg/dL (ref 70–99)
Glucose, Bld: 123 mg/dL — ABNORMAL HIGH (ref 70–99)
Potassium: 4.5 mmol/L (ref 3.5–5.1)
Potassium: 4.6 mmol/L (ref 3.5–5.1)
Sodium: 133 mmol/L — ABNORMAL LOW (ref 135–145)
Sodium: 136 mmol/L (ref 135–145)

## 2019-07-03 LAB — CBC
HCT: 35.3 % — ABNORMAL LOW (ref 39.0–52.0)
HCT: 36.3 % — ABNORMAL LOW (ref 39.0–52.0)
Hemoglobin: 11.6 g/dL — ABNORMAL LOW (ref 13.0–17.0)
Hemoglobin: 12.1 g/dL — ABNORMAL LOW (ref 13.0–17.0)
MCH: 33.8 pg (ref 26.0–34.0)
MCH: 33.8 pg (ref 26.0–34.0)
MCHC: 32.9 g/dL (ref 30.0–36.0)
MCHC: 33.3 g/dL (ref 30.0–36.0)
MCV: 102.9 fL — ABNORMAL HIGH (ref 80.0–100.0)
MCV: 101.4 fL — ABNORMAL HIGH (ref 80.0–100.0)
Platelets: 300 10*3/uL (ref 150–400)
Platelets: 300 10*3/uL (ref 150–400)
RBC: 3.43 MIL/uL — ABNORMAL LOW (ref 4.22–5.81)
RBC: 3.58 MIL/uL — ABNORMAL LOW (ref 4.22–5.81)
RDW: 14.8 % (ref 11.5–15.5)
RDW: 14.6 % (ref 11.5–15.5)
WBC: 8.9 10*3/uL (ref 4.0–10.5)
WBC: 12.9 10*3/uL — ABNORMAL HIGH (ref 4.0–10.5)
nRBC: 0 % (ref 0.0–0.2)
nRBC: 0 % (ref 0.0–0.2)

## 2019-07-03 SURGERY — OPEN REDUCTION INTERNAL FIXATION (ORIF) DISTAL HUMERUS FRACTURE
Anesthesia: Regional | Site: Arm Upper | Laterality: Right

## 2019-07-03 MED ORDER — ONDANSETRON HCL 4 MG/2ML IJ SOLN
INTRAMUSCULAR | Status: DC | PRN
  Administered 2019-07-03: 4 mg via INTRAVENOUS

## 2019-07-03 MED ORDER — CEFAZOLIN SODIUM-DEXTROSE 2-4 GM/100ML-% IV SOLN
INTRAVENOUS | Status: AC
  Filled 2019-07-03: qty 100

## 2019-07-03 MED ORDER — SODIUM CHLORIDE 0.9 % IV SOLN
INTRAVENOUS | Status: DC | PRN
  Administered 2019-07-03: 50 ug/min via INTRAVENOUS

## 2019-07-03 MED ORDER — METHOCARBAMOL 500 MG PO TABS: 500.0000 mg | ORAL_TABLET | Freq: Four times a day (QID) | ORAL | Status: DC | PRN

## 2019-07-03 MED ORDER — ROCURONIUM BROMIDE 10 MG/ML (PF) SYRINGE
PREFILLED_SYRINGE | INTRAVENOUS | Status: AC
  Filled 2019-07-03: qty 10

## 2019-07-03 MED ORDER — ONDANSETRON HCL 4 MG/2ML IJ SOLN: 4.0000 mg | Freq: Four times a day (QID) | INTRAMUSCULAR | Status: DC | PRN

## 2019-07-03 MED ORDER — POLYETHYLENE GLYCOL 3350 17 G PO PACK: 17.0000 g | PACK | Freq: Every day | ORAL | Status: DC | PRN

## 2019-07-03 MED ORDER — ATORVASTATIN CALCIUM 40 MG PO TABS
40.0000 mg | ORAL_TABLET | Freq: Every day | ORAL | Status: DC
  Administered 2019-07-03: 40 mg via ORAL
  Filled 2019-07-03: qty 1

## 2019-07-03 MED ORDER — MORPHINE SULFATE (PF) 2 MG/ML IV SOLN: 0.5000 mg | INTRAVENOUS | Status: DC | PRN

## 2019-07-03 MED ORDER — LIDOCAINE 2% (20 MG/ML) 5 ML SYRINGE
INTRAMUSCULAR | Status: AC
  Filled 2019-07-03: qty 5

## 2019-07-03 MED ORDER — SUGAMMADEX SODIUM 200 MG/2ML IV SOLN
INTRAVENOUS | Status: DC | PRN
  Administered 2019-07-03: 200 mg via INTRAVENOUS

## 2019-07-03 MED ORDER — LIDOCAINE 2% (20 MG/ML) 5 ML SYRINGE
INTRAMUSCULAR | Status: DC | PRN
  Administered 2019-07-03: 100 mg via INTRAVENOUS

## 2019-07-03 MED ORDER — HYDROCODONE-ACETAMINOPHEN 5-325 MG PO TABS: 1.0000 | ORAL_TABLET | ORAL | Status: DC | PRN

## 2019-07-03 MED ORDER — NITROGLYCERIN 0.4 MG SL SUBL: 0.4000 mg | SUBLINGUAL_TABLET | SUBLINGUAL | Status: DC | PRN

## 2019-07-03 MED ORDER — DIPHENHYDRAMINE HCL 12.5 MG/5ML PO ELIX: 12.5000 mg | ORAL_SOLUTION | ORAL | Status: DC | PRN

## 2019-07-03 MED ORDER — FENTANYL CITRATE (PF) 100 MCG/2ML IJ SOLN
INTRAMUSCULAR | Status: DC | PRN
  Administered 2019-07-03: 100 ug via INTRAVENOUS

## 2019-07-03 MED ORDER — CEFAZOLIN SODIUM-DEXTROSE 1-4 GM/50ML-% IV SOLN
1.0000 g | Freq: Three times a day (TID) | INTRAVENOUS | Status: DC
  Administered 2019-07-03 – 2019-07-04 (×2): 1 g via INTRAVENOUS
  Filled 2019-07-03 (×2): qty 50

## 2019-07-03 MED ORDER — LACTATED RINGERS IV SOLN
INTRAVENOUS | Status: DC
  Administered 2019-07-03 (×2): via INTRAVENOUS

## 2019-07-03 MED ORDER — CEFAZOLIN SODIUM-DEXTROSE 2-4 GM/100ML-% IV SOLN
2.0000 g | INTRAVENOUS | Status: AC
  Administered 2019-07-03: 2 g via INTRAVENOUS

## 2019-07-03 MED ORDER — TEMAZEPAM 7.5 MG PO CAPS
15.0000 mg | ORAL_CAPSULE | Freq: Two times a day (BID) | ORAL | Status: DC
  Administered 2019-07-03: 15 mg via ORAL
  Filled 2019-07-03 (×2): qty 2

## 2019-07-03 MED ORDER — POTASSIUM CHLORIDE ER 10 MEQ PO TBCR: 10.0000 meq | EXTENDED_RELEASE_TABLET | ORAL | Status: DC

## 2019-07-03 MED ORDER — FENTANYL CITRATE (PF) 100 MCG/2ML IJ SOLN
INTRAMUSCULAR | Status: AC
  Administered 2019-07-03: 13:00:00 50 ug via INTRAVENOUS
  Filled 2019-07-03: qty 2

## 2019-07-03 MED ORDER — MIDAZOLAM HCL 2 MG/2ML IJ SOLN
1.0000 mg | Freq: Once | INTRAMUSCULAR | Status: AC
  Administered 2019-07-03: 13:00:00 1 mg via INTRAVENOUS

## 2019-07-03 MED ORDER — HYDROCODONE-ACETAMINOPHEN 7.5-325 MG PO TABS: 1.0000 | ORAL_TABLET | ORAL | Status: DC | PRN

## 2019-07-03 MED ORDER — CARVEDILOL 3.125 MG PO TABS
3.1250 mg | ORAL_TABLET | Freq: Two times a day (BID) | ORAL | Status: DC
  Administered 2019-07-03 – 2019-07-04 (×2): 3.125 mg via ORAL
  Filled 2019-07-03 (×2): qty 1

## 2019-07-03 MED ORDER — ONDANSETRON HCL 4 MG PO TABS: 4.0000 mg | ORAL_TABLET | Freq: Four times a day (QID) | ORAL | Status: DC | PRN

## 2019-07-03 MED ORDER — FENTANYL CITRATE (PF) 250 MCG/5ML IJ SOLN
INTRAMUSCULAR | Status: AC
  Filled 2019-07-03: qty 5

## 2019-07-03 MED ORDER — POVIDONE-IODINE 10 % EX SWAB
2.0000 "application " | Freq: Once | CUTANEOUS | Status: AC
  Administered 2019-07-03: 2 via TOPICAL

## 2019-07-03 MED ORDER — ACETAMINOPHEN 325 MG PO TABS: 325.0000 mg | ORAL_TABLET | Freq: Four times a day (QID) | ORAL | Status: DC | PRN

## 2019-07-03 MED ORDER — FENTANYL CITRATE (PF) 100 MCG/2ML IJ SOLN
50.0000 ug | Freq: Once | INTRAMUSCULAR | Status: AC
  Administered 2019-07-03: 13:00:00 50 ug via INTRAVENOUS

## 2019-07-03 MED ORDER — CHLORHEXIDINE GLUCONATE 4 % EX LIQD: 60.0000 mL | Freq: Once | CUTANEOUS | Status: DC

## 2019-07-03 MED ORDER — BUPIVACAINE-EPINEPHRINE (PF) 0.5% -1:200000 IJ SOLN
INTRAMUSCULAR | Status: DC | PRN
  Administered 2019-07-03: 30 mL via PERINEURAL

## 2019-07-03 MED ORDER — ENSURE PRE-SURGERY PO LIQD: 296.0000 mL | Freq: Once | ORAL | Status: DC

## 2019-07-03 MED ORDER — PROSIGHT PO TABS
1.0000 | ORAL_TABLET | Freq: Every day | ORAL | Status: DC
  Administered 2019-07-04: 1 via ORAL
  Filled 2019-07-03: qty 1

## 2019-07-03 MED ORDER — PROPOFOL 10 MG/ML IV BOLUS
INTRAVENOUS | Status: DC | PRN
  Administered 2019-07-03: 100 mg via INTRAVENOUS

## 2019-07-03 MED ORDER — MIDAZOLAM HCL 2 MG/2ML IJ SOLN
INTRAMUSCULAR | Status: AC
  Administered 2019-07-03: 13:00:00 1 mg via INTRAVENOUS
  Filled 2019-07-03: qty 2

## 2019-07-03 MED ORDER — FINASTERIDE 5 MG PO TABS
5.0000 mg | ORAL_TABLET | Freq: Every day | ORAL | Status: DC
  Administered 2019-07-04: 5 mg via ORAL
  Filled 2019-07-03: qty 1

## 2019-07-03 MED ORDER — AMLODIPINE BESYLATE 5 MG PO TABS
5.0000 mg | ORAL_TABLET | Freq: Every day | ORAL | Status: DC
  Administered 2019-07-04: 5 mg via ORAL
  Filled 2019-07-03: qty 1

## 2019-07-03 MED ORDER — ASPIRIN EC 81 MG PO TBEC
81.0000 mg | DELAYED_RELEASE_TABLET | Freq: Every day | ORAL | Status: DC
  Administered 2019-07-04: 81 mg via ORAL
  Filled 2019-07-03: qty 1

## 2019-07-03 MED ORDER — METHOCARBAMOL 1000 MG/10ML IJ SOLN
500.0000 mg | Freq: Four times a day (QID) | INTRAVENOUS | Status: DC | PRN
  Filled 2019-07-03: qty 5

## 2019-07-03 MED ORDER — NON FORMULARY
Status: DC | PRN
  Administered 2019-07-03: 500 mL

## 2019-07-03 MED ORDER — FUROSEMIDE 40 MG PO TABS: 40.0000 mg | ORAL_TABLET | ORAL | Status: DC

## 2019-07-03 MED ORDER — PHENYLEPHRINE 40 MCG/ML (10ML) SYRINGE FOR IV PUSH (FOR BLOOD PRESSURE SUPPORT)
PREFILLED_SYRINGE | INTRAVENOUS | Status: DC | PRN
  Administered 2019-07-03: 120 ug via INTRAVENOUS

## 2019-07-03 MED ORDER — SPIRONOLACTONE 12.5 MG HALF TABLET
12.5000 mg | ORAL_TABLET | Freq: Every day | ORAL | Status: DC
  Administered 2019-07-04: 12.5 mg via ORAL
  Filled 2019-07-03 (×2): qty 1

## 2019-07-03 MED ORDER — PROPOFOL 10 MG/ML IV BOLUS
INTRAVENOUS | Status: AC
  Filled 2019-07-03: qty 20

## 2019-07-03 MED ORDER — ROCURONIUM BROMIDE 10 MG/ML (PF) SYRINGE
PREFILLED_SYRINGE | INTRAVENOUS | Status: DC | PRN
  Administered 2019-07-03: 90 mg via INTRAVENOUS

## 2019-07-03 MED ORDER — PHENYLEPHRINE 40 MCG/ML (10ML) SYRINGE FOR IV PUSH (FOR BLOOD PRESSURE SUPPORT)
PREFILLED_SYRINGE | INTRAVENOUS | Status: AC
  Filled 2019-07-03: qty 10

## 2019-07-03 MED ORDER — SODIUM CHLORIDE 0.9 % IR SOLN
Status: DC | PRN
  Administered 2019-07-03: 1000 mL

## 2019-07-03 SURGICAL SUPPLY — 81 items
BENZOIN TINCTURE PRP APPL 2/3 (GAUZE/BANDAGES/DRESSINGS) IMPLANT
BIT DRILL 2.0 LNG QUCK RELEASE (BIT) ×1 IMPLANT
BIT DRILL 2.8 QUICK RELEASE (BIT) ×1 IMPLANT
BLADE CLIPPER SURG (BLADE) IMPLANT
BLADE SURG 10 STRL SS (BLADE) ×2 IMPLANT
BNDG COHESIVE 4X5 TAN STRL (GAUZE/BANDAGES/DRESSINGS) ×2 IMPLANT
BNDG ELASTIC 4X5.8 VLCR STR LF (GAUZE/BANDAGES/DRESSINGS) ×2 IMPLANT
BNDG ESMARK 4X9 LF (GAUZE/BANDAGES/DRESSINGS) ×2 IMPLANT
BNDG GAUZE ELAST 4 BULKY (GAUZE/BANDAGES/DRESSINGS) ×2 IMPLANT
CHLORAPREP W/TINT 26 (MISCELLANEOUS) ×4 IMPLANT
COVER SURGICAL LIGHT HANDLE (MISCELLANEOUS) ×2 IMPLANT
COVER WAND RF STERILE (DRAPES) IMPLANT
CUFF TOURN SGL QUICK 18X4 (TOURNIQUET CUFF) ×2 IMPLANT
CUFF TOURN SGL QUICK 24 (TOURNIQUET CUFF)
CUFF TRNQT CYL 24X4X16.5-23 (TOURNIQUET CUFF) IMPLANT
DRAPE C-ARM 42X72 X-RAY (DRAPES) IMPLANT
DRAPE C-ARM MINI 42X72 WSTRAPS (DRAPES) ×2 IMPLANT
DRAPE IMP U-DRAPE 54X76 (DRAPES) ×4 IMPLANT
DRAPE INCISE IOBAN 66X45 STRL (DRAPES) IMPLANT
DRAPE ORTHO SPLIT 77X108 STRL (DRAPES) ×3
DRAPE SURG 17X23 STRL (DRAPES) IMPLANT
DRAPE SURG ORHT 6 SPLT 77X108 (DRAPES) ×3 IMPLANT
DRAPE U-SHAPE 47X51 STRL (DRAPES) ×2 IMPLANT
DRILL 2.0 LNG QUICK RELEASE (BIT) ×2
DRILL 2.8 QUICK RELEASE (BIT) ×2
DRSG PAD ABDOMINAL 8X10 ST (GAUZE/BANDAGES/DRESSINGS) ×2 IMPLANT
DRSG XEROFORM 1X8 (GAUZE/BANDAGES/DRESSINGS) ×2 IMPLANT
ELECT REM PT RETURN 9FT ADLT (ELECTROSURGICAL) ×2
ELECTRODE REM PT RTRN 9FT ADLT (ELECTROSURGICAL) ×1 IMPLANT
EVACUATOR 1/8 PVC DRAIN (DRAIN) ×2 IMPLANT
GAUZE SPONGE 4X4 12PLY STRL (GAUZE/BANDAGES/DRESSINGS) ×2 IMPLANT
GLOVE BIOGEL PI IND STRL 8 (GLOVE) ×2 IMPLANT
GLOVE BIOGEL PI INDICATOR 8 (GLOVE) ×2
GLOVE SURG SYN 7.5  E (GLOVE) ×2
GLOVE SURG SYN 7.5 E (GLOVE) ×2 IMPLANT
GOWN STRL REUS W/ TWL LRG LVL3 (GOWN DISPOSABLE) ×4 IMPLANT
GOWN STRL REUS W/TWL LRG LVL3 (GOWN DISPOSABLE) ×4
GUIDEWIRE ORTH 6X062XTROC NS (WIRE) ×2 IMPLANT
HANDPIECE INTERPULSE COAX TIP (DISPOSABLE) ×1
K-WIRE .062 (WIRE) ×2
KIT BASIN OR (CUSTOM PROCEDURE TRAY) ×2 IMPLANT
KIT TURNOVER KIT B (KITS) ×2 IMPLANT
MANIFOLD NEPTUNE II (INSTRUMENTS) ×2 IMPLANT
NS IRRIG 1000ML POUR BTL (IV SOLUTION) ×2 IMPLANT
PACK ORTHO EXTREMITY (CUSTOM PROCEDURE TRAY) ×2 IMPLANT
PAD ARMBOARD 7.5X6 YLW CONV (MISCELLANEOUS) ×4 IMPLANT
PAD CAST 4YDX4 CTTN HI CHSV (CAST SUPPLIES) ×1 IMPLANT
PADDING CAST COTTON 4X4 STRL (CAST SUPPLIES) ×1
PLATE LOCK PL 152 11H RT (Plate) ×2 IMPLANT
SCREW CORTICAL 3.5X20MM (Screw) ×2 IMPLANT
SCREW HEX LOCK 3.5X20MM (Screw) ×2 IMPLANT
SCREW HEX NON LOCK 3.5X26MM (Screw) ×4 IMPLANT
SCREW LOCK 20X2.7X HEXALOBE (Screw) ×2 IMPLANT
SCREW LOCK 24X2.7X HEXALOBE (Screw) ×1 IMPLANT
SCREW LOCK 24X3.5X HEXALOBE (Screw) ×1 IMPLANT
SCREW LOCK 26X2.7X HEXALOBE (Screw) ×1 IMPLANT
SCREW LOCKING 2.7X20MM (Screw) ×2 IMPLANT
SCREW LOCKING 2.7X24MM (Screw) ×1 IMPLANT
SCREW LOCKING 2.7X26MM (Screw) ×1 IMPLANT
SCREW LOCKING 3.5X24 (Screw) ×1 IMPLANT
SCREW LOCKING HEX 3.5X26MM (Screw) ×2 IMPLANT
SCREW NONLOCK HEX 3.5X12 (Screw) ×2 IMPLANT
SEALER BIPOLAR AQUA 6.0 (INSTRUMENTS) IMPLANT
SET HNDPC FAN SPRY TIP SCT (DISPOSABLE) ×1 IMPLANT
SPONGE LAP 18X18 RF (DISPOSABLE) IMPLANT
STAPLER VISISTAT 35W (STAPLE) ×2 IMPLANT
STOCKINETTE IMPERVIOUS 9X36 MD (GAUZE/BANDAGES/DRESSINGS) ×2 IMPLANT
STRIP CLOSURE SKIN 1/2X4 (GAUZE/BANDAGES/DRESSINGS) IMPLANT
SUCTION FRAZIER HANDLE 10FR (MISCELLANEOUS)
SUCTION TUBE FRAZIER 10FR DISP (MISCELLANEOUS) IMPLANT
SUT MNCRL AB 4-0 PS2 18 (SUTURE) IMPLANT
SUT MON AB 2-0 CT1 36 (SUTURE) IMPLANT
SUT VIC AB 0 CT1 27 (SUTURE)
SUT VIC AB 0 CT1 27XBRD ANBCTR (SUTURE) IMPLANT
TOWEL GREEN STERILE (TOWEL DISPOSABLE) ×2 IMPLANT
TOWEL GREEN STERILE FF (TOWEL DISPOSABLE) ×2 IMPLANT
TUBE CONNECTING 12X1/4 (SUCTIONS) ×4 IMPLANT
UNDERPAD 30X30 (UNDERPADS AND DIAPERS) IMPLANT
WATER STERILE IRR 1000ML POUR (IV SOLUTION) ×2 IMPLANT
WIRE PLATE TACK (WIRE) ×4 IMPLANT
YANKAUER SUCT BULB TIP NO VENT (SUCTIONS) ×2 IMPLANT

## 2019-07-03 NOTE — Anesthesia Procedure Notes (Signed)
Procedure Name: Intubation Date/Time: 07/03/2019 2:59 PM Performed by: Barrington Ellison, CRNA Pre-anesthesia Checklist: Patient identified, Emergency Drugs available, Suction available and Patient being monitored Patient Re-evaluated:Patient Re-evaluated prior to induction Oxygen Delivery Method: Circle System Utilized Preoxygenation: Pre-oxygenation with 100% oxygen Induction Type: IV induction Ventilation: Mask ventilation without difficulty Laryngoscope Size: Mac and 4 Grade View: Grade I Tube type: Oral Tube size: 7.5 mm Number of attempts: 1 Airway Equipment and Method: Stylet Placement Confirmation: ETT inserted through vocal cords under direct vision,  positive ETCO2 and breath sounds checked- equal and bilateral Secured at: 22 cm Tube secured with: Tape Dental Injury: Teeth and Oropharynx as per pre-operative assessment

## 2019-07-03 NOTE — Anesthesia Postprocedure Evaluation (Signed)
Anesthesia Post Note  Patient: [name]  Procedure(s) Performed: Right distal humerus open reduction, internal fixation (Right Arm Upper)     Patient location during evaluation: PACU Anesthesia Type: Regional Level of consciousness: awake and alert Pain management: pain level controlled Vital Signs Assessment: post-procedure vital signs reviewed and stable Respiratory status: spontaneous breathing, nonlabored ventilation, respiratory function stable and patient connected to nasal cannula oxygen Cardiovascular status: blood pressure returned to baseline and stable Postop Assessment: no apparent nausea or vomiting Anesthetic complications: no    Last Vitals:  Vitals:   07/03/19 1146 07/03/19 1711  BP: (!) 195/82 (!) 113/58  Pulse: 78 61  Resp: 20 18  Temp: 36.4 C   SpO2: 99% 99%    Last Pain:  Vitals:   07/03/19 1711  TempSrc:   PainSc: Asleep                 Ossiel Marchio DAVID

## 2019-07-03 NOTE — H&P (Signed)
ORTHOPAEDIC H&P  PCP:  Shirline Frees, MD  Chief Complaint: Right elbow pain  HPI: [name] is a 85 y.o. male who complains of right elbow pain.  He was seen and evaluated by me in clinic and found to have a displaced right extra-articular distal humerus fracture.  He was treated initially conservatively with a long-arm splint to allow for swelling and hematoma to consolidate and decrease.  He is subsequently seen back in clinic where he had increased displacement of his fracture and we further discussed operative intervention.  After discussing risks of surgery he did wish to proceed with surgical fixation of his right distal humerus fracture and presents today for that.  Past Medical History:  Diagnosis Date  . Carotid artery disease ([place])   . Coronary artery disease    PCI RCA 2009  . Dyslipidemia   . Hypertension   . Scleritis, unspecified    treated with prednisone  . Seizure [facility])    felt due to cyclosporin in setting of electrolyte disorder  . Solitary kidney   . Symptomatic bradycardia    a. s/p STJ dual chamber pacemaker   Past Surgical History:  Procedure Laterality Date  . CARDIAC CATHETERIZATION     12 years ago?  . COLONOSCOPY    . EYE SURGERY     right eye about 10 years ago  . PACEMAKER INSERTION  2009   STJ dual chamber pacemaker implanted by Dr Olevia Perches for symptomatic bradycardia   Social History   Socioeconomic History  . Marital status: Widowed    Spouse name: Not on file  . Number of children: 2  . Years of education: college  . Highest education level: Not on file  Occupational History  . Occupation: retired  Scientific laboratory technician  . Financial resource strain: Not on file  . Food insecurity    Worry: Not on file    Inability: Not on file  . Transportation needs    Medical: Not on file    Non-medical: Not on file  Tobacco Use  . Smoking status: Never Smoker  . Smokeless tobacco: Never Used  Substance and Sexual Activity  . Alcohol use:  Yes    Comment: rarely  . Drug use: No  . Sexual activity: Not on file  Lifestyle  . Physical activity    Days per week: Not on file    Minutes per session: Not on file  . Stress: Not on file  Relationships  . Social Herbalist on phone: Not on file    Gets together: Not on file    Attends religious service: Not on file    Active member of club or organization: Not on file    Attends meetings of clubs or organizations: Not on file    Relationship status: Not on file  Other Topics Concern  . Not on file  Social History Narrative   Married   Tobacco   etoh   Family History  Problem Relation Age of Onset  . Arthritis/Rheumatoid Mother    Allergies  Allergen Reactions  . Cyclosporine     unknown  . Maxzide [Triamterene-Hctz] Other (See Comments)    Adverse reaction-bloating and abdominal pain  . Sulfur Other (See Comments)    No energy, could not walk   . Prednisone Other (See Comments)    Can tolerate for a short period of time Unknown reaction   Prior to Admission medications   Medication Sig Start Date  End Date Taking? Authorizing Provider  amLODipine (NORVASC) 5 MG tablet Take 1 tablet (5 mg total) by mouth daily. 06/03/19  Yes Fay Records, MD  atorvastatin (LIPITOR) 40 MG tablet TAKE 1 TABLET BY MOUTH ONCE DAILY AT  6PM 06/24/19  Yes Fay Records, MD  beta carotene w/minerals (OCUVITE) tablet Take 1 tablet by mouth daily.   Yes [provider]  carvedilol (COREG) 3.125 MG tablet Take 1 tablet (3.125 mg total) by mouth 2 (two) times daily. 06/03/19  Yes Fay Records, MD  finasteride (PROSCAR) 5 MG tablet Take 5 mg by mouth daily.   Yes [provider]  furosemide (LASIX) 40 MG tablet Take 1 tablet (40 mg total) by mouth once a week. 06/03/19  Yes Fay Records, MD  Multiple Vitamins-Minerals (CENTRUM SILVER PO) Take 1 capsule by mouth daily.    Yes [provider]  oxyCODONE-acetaminophen (PERCOCET/ROXICET) 5-325 MG tablet Take 1  tablet by mouth every 6 (six) hours as needed for severe pain. 06/17/19  Yes Petrucelli, Samantha R, PA-C  potassium chloride (K-DUR) 10 MEQ tablet Take 1 tablet (10 mEq total) by mouth once a week. Take with furosemide 06/03/19  Yes Fay Records, MD  spironolactone (ALDACTONE) 25 MG tablet Take 0.5 tablets (12.5 mg total) by mouth daily. 06/03/19  Yes Fay Records, MD  temazepam (RESTORIL) 15 MG capsule Take 15 mg by mouth 2 (two) times daily.    Yes [provider]  acetaminophen (TYLENOL) 325 MG tablet Take 650 mg by mouth every 6 (six) hours as needed for mild pain.    [provider]  aspirin 81 MG tablet Take 81 mg by mouth daily.  07/19/11   Allred, Jeneen Rinks, MD  clopidogrel (PLAVIX) 75 MG tablet Take 1 tablet (75 mg total) by mouth daily. Patient taking differently: Take 75 mg by mouth at bedtime.  06/03/19   Fay Records, MD  metroNIDAZOLE (METROCREAM) 0.75 % cream Apply 1 application topically 2 (two) times daily as needed (skin).     [provider]  NITROSTAT 0.4 MG SL tablet DISSOLVE ONE TABLET UNDER THE TONGUE EVERY 5 MINUTES AS NEEDED FOR CHEST PAIN.  DO NOT EXCEED A TOTAL OF 3 DOSES IN 15 MINUTES Patient taking differently: Place 0.4 mg under the tongue every 5 (five) minutes as needed for chest pain.  11/19/18   Fay Records, MD   No results found.  Positive ROS: All other systems have been reviewed and were otherwise negative with the exception of those mentioned in the HPI and as above.  Physical Exam: General: Alert, no acute distress Cardiovascular: No pedal edema Respiratory: No cyanosis, no use of accessory musculature Skin: No lesions in the area of chief complaint Psychiatric: Patient is competent for consent with normal mood and affect Lymphatic: No axillary or cervical lymphadenopathy  MUSCULOSKELETAL: Examination of the right elbow shows a moderately swollen right elbow. There is large area of ecchymosis which has decreased fluctuance from his  prior exam. He has pitting edema to the elbow posteriorly and laterally as well. He has tenderness to palpation along the distal humerus. He has crepitus with attempted elbow range of motion as well as pain. He also has moderate swelling and ecchymosis down the dorsal aspect of the forearm And hand. He has no tenderness to palpation along the forearm, wrist or hand. His fingertips are warm or perfused with brisk capillary refill. His sensation is intact to light touch throughout all digits. His  motor is intact to AIN, PIN and ulnar nerve distributions.  Assessment: Right displaced extra-articular distal humerus fracture  Plan: OR today for open reduction internal fixation of his right distal humerus fracture. Risks of surgery were discussed with him once again which include but not limited to infection, bleeding, damage to surrounding structures including blood vessels and nerves, pain, stiffness, malunion, nonunion, implant failure and need for additional procedures.  Informed consent was obtained.  The patient's right arm was marked. Plan for observation overnight and discharge in the morning.    Verner Mould, MD [phone]   07/03/2019 2:19 PM

## 2019-07-03 NOTE — Transfer of Care (Signed)
Immediate Anesthesia Transfer of Care Note  Patient: [name]  Procedure(s) Performed: Right distal humerus open reduction, internal fixation (Right Arm Upper)  Patient Location: PACU  Anesthesia Type:General and Regional  Level of Consciousness: awake, alert  and oriented  Airway & Oxygen Therapy: Patient Spontanous Breathing  Post-op Assessment: Report given to RN  Post vital signs: Reviewed and stable  Last Vitals:  Vitals Value Taken Time  BP    Temp    Pulse 61 07/03/19 1711  Resp 18 07/03/19 1711  SpO2 99 % 07/03/19 1711  Vitals shown include unvalidated device data.  Last Pain:  Vitals:   07/03/19 1146  TempSrc: Oral         Complications: No apparent anesthesia complications

## 2019-07-03 NOTE — Op Note (Signed)
PREOPERATIVE DIAGNOSIS: Displaced right extra-articular distal humerus fracture  POSTOPERATIVE DIAGNOSIS: Same  ATTENDING PHYSICIAN: Maudry Mayhew. Jeannie Fend, III, MD who was present and scrubbed for the entire case   ASSISTANT SURGEON: None.   ANESTHESIA: General with regional  SURGICAL PROCEDURES: Open reduction and internal fixation of right extra-articular distal humerus fracture.  SURGICAL INDICATIONS: Patient is an 85 year old male who was seen and evaluated by me in clinic.  He had a fall approximately 2 weeks ago now while at home.  He landed onto the right arm.  He was seen at outside facility where he was found to have a displaced extra-articular right distal humerus fracture.  He was placed into a splint and then sent to see me.  On initial exam he did have significant swelling as well as a large hematoma on the lateral aspect the elbow.  He was placed back into a splint and had cardiology clearance worked up as well as elevation and ice to the elbow.  He was subsequently repeat evaluated which showed decreased swelling and consolidation of his hematoma.  After discussing treatment options the patient did agree to proceed with open reduction and internal fixation of his right distal humerus.  FINDING: There is a comminuted, displaced fracture of the right distal humerus.  Near anatomic reduction was able to be achieved and held in place with a posterior lateral Acumed distal humerus plate.  DESCRIPTION OF PROCEDURE: Patient was identified in the preop holding area where the risk benefits and alternatives of the procedure were discussed with the patient.  These include but are not limited to infection, bleeding, damage to surrounding structures including blood vessels and nerves, pain, stiffness, malunion, nonunion, implant failure and need for additional procedures.  Informed consent was obtained at that time patient's right arm was marked with a marking pen.  He then underwent a right upper  extremity plexus block by anesthesia.  He was then brought to the operative suite where a timeout was performed identifying the correct patient op site.  He was induced under general anesthesia and positioned in the lateral position with a beanbag.  The arm was placed over a arm holder.  The right upper extremity was then prepped and draped in usual sterile fashion.  A sterile tourniquet was placed on the upper arm and the arm was then exsanguinated and the tourniquet was inflated.  A longitudinal incision was made along the posterior aspect of the elbow.  Radial and ulnar soft tissue flaps were bluntly elevated.  The triceps tendon was identified in the midportion of it was incised creating a triceps split.  The tricep muscle was elevated off the posterior humerus exposing the fracture at the distal humeral shaft.  The soft tissues were elevated bluntly using a key elevator.  Proximally care was taken to visualize and protect the radial nerve which was done successfully.  Once all soft tissue had been elevated off the posterior humerus the fracture was mobilized.  There was early callus formation within the fracture site which was debrided using a rondure and curette.  Once the fracture was debrided it was reduced into near anatomic position and held in place with multiple clamps.  A posterior lateral distal humerus plate for Acumed was then placed into appropriate positioning and pinned.  Fluoroscopic images were obtained which showed appropriate alignment of the plate as well as fracture.  Multiple locking and nonlocking screws were placed both proximally and distally in the plate securing the fracture.  Multiple fluoroscopic images were  obtained which showed maintenance of the near anatomic reduction as well as appropriate plate positioning and screw lengths.  At this point the wound was copiously irrigated with normal saline.  The triceps tendon split was closed with a running 0 Vicryl lock suture.  The skin  was closed with deep 2-0 Vicryl sutures followed by staples.  Xeroform, 4 x 4's and a well-padded long-arm splint was placed.  The tourniquet was released and the patient had return of brisk capillary refill to all of his digits.  He was awoken from his anesthesia and extubated in the operating room without complications.  Was taken back in stable condition.  He tolerated procedure well there are no complications.  RADIOGRAPHIC INTERPRETATION: Multiple PA and lateral images of the right distal humerus were obtained intraoperatively under fluoroscopic images.  This shows near anatomic alignment of the distal humerus fracture with posterior plate and screw construct.  Plate and screws appear to be appropriately positioned and screw lengths.  ESTIMATED BLOOD LOSS: 50 mL  TOURNIQUET TIME: 90 minutes  SPECIMENS: None  POSTOPERATIVE PLAN: The patient will be admitted postoperatively for observation.  We will plan on pulling his drain tomorrow.  As long as he is doing well he will be discharged home tomorrow and follow-up with me in approximately 10 to 14 days for removal of his splint and to begin some gentle early range of motion exercises to the elbow.  IMPLANTS: Acumed posterior lateral distal humerus locking plate

## 2019-07-03 NOTE — Anesthesia Procedure Notes (Signed)
Anesthesia Regional Block: Supraclavicular block   Pre-Anesthetic Checklist: ,, timeout performed, Correct Patient, Correct Site, Correct Laterality, Correct Procedure, Correct Position, site marked, Risks and benefits discussed,  Surgical consent,  Pre-op evaluation,  At surgeon's request and post-op pain management  Laterality: Right  Prep: chloraprep       Needles:   Needle Type: Echogenic Stimulator Needle     Needle Length: 9cm  Needle Gauge: 21     Additional Needles:   Procedures:, nerve stimulator,,,,,,,   Nerve Stimulator or Paresthesia:  Response: 0.4 mA,   Additional Responses:   Narrative:  Start time: 07/03/2019 1:05 PM End time: 07/03/2019 1:15 PM Injection made incrementally with aspirations every 5 mL.  Performed by: Personally  Anesthesiologist: Lillia Abed, MD  Additional Notes: Monitors applied. Patient sedated. Sterile prep and drape,hand hygiene and sterile gloves were used. Relevant anatomy identified.Needle position confirmed.Local anesthetic injected incrementally after negative aspiration. Local anesthetic spread visualized around nerve(s). Vascular puncture avoided. No complications. Image printed for medical record.The patient tolerated the procedure well.

## 2019-07-03 NOTE — Plan of Care (Signed)

## 2019-07-04 DIAGNOSIS — I10 Essential (primary) hypertension: Secondary | ICD-10-CM | POA: Diagnosis not present

## 2019-07-04 DIAGNOSIS — H15009 Unspecified scleritis, unspecified eye: Secondary | ICD-10-CM | POA: Diagnosis not present

## 2019-07-04 DIAGNOSIS — M25521 Pain in right elbow: Secondary | ICD-10-CM | POA: Diagnosis not present

## 2019-07-04 DIAGNOSIS — I2510 Atherosclerotic heart disease of native coronary artery without angina pectoris: Secondary | ICD-10-CM | POA: Diagnosis not present

## 2019-07-04 DIAGNOSIS — S42401A Unspecified fracture of lower end of right humerus, initial encounter for closed fracture: Secondary | ICD-10-CM | POA: Diagnosis not present

## 2019-07-04 DIAGNOSIS — E785 Hyperlipidemia, unspecified: Secondary | ICD-10-CM | POA: Diagnosis not present

## 2019-07-04 MED ORDER — HYDROCODONE-ACETAMINOPHEN 5-325 MG PO TABS: 1.0000 | ORAL_TABLET | ORAL | 0 refills | Status: DC | PRN

## 2019-07-04 NOTE — Evaluation (Addendum)
Physical Therapy Evaluation Patient Details Name: [name] MRN: [id number] DOB: September 08, 1931 Today's Date: 07/04/2019   History of Present Illness  The patient is a pleasant 85 yo male presenting today s/p ORIF (10/15) of R humerus fx he sustained in a fall at his home. PMH significant for CAD, HTN, and pacemaker placement (2009).  Clinical Impression  The patient was cooperative and eager to participate in therapy, demonstrating good knowledge and understanding of NWB status and safety as it relates to ambulation and ADLs. The patient was able to ambulate 264ft with min guard and no AD at a speed of 0.78m/s, which he reports is similar to baseline abilities. A gait speed of 0.63m/s does indicate he is at risk for falls, and therefore I recommend HHPT to follow up to assess home environment as well as continued functional balance and mobility training.    Follow Up Recommendations Home health PT;Supervision for mobility/OOB    Equipment Recommendations  None recommended by PT(Pt already has SPC that PT recommends using)    Recommendations for Other Services       Precautions / Restrictions Precautions Precautions: ICD/Pacemaker;Fall Required Braces or Orthoses: Splint/Cast Splint/Cast: sling for RUE Restrictions Weight Bearing Restrictions: Yes RUE Weight Bearing: Non weight bearing      Mobility  Bed Mobility               General bed mobility comments: Pt in recliner upon PT entry, no needs identified  Transfers Overall transfer level: Needs assistance Equipment used: Straight cane Transfers: Sit to/from Stand Sit to Stand: Supervision         General transfer comment: Pt was able to complete transfer without VCs or assist, supervision is beneficial for added safety  Ambulation/Gait Ambulation/Gait assistance: Min guard Gait Distance (Feet): 250 Feet Assistive device: None Gait Pattern/deviations: Step-through pattern Gait velocity: 0.55 m/s Gait velocity  interpretation: <1.31 ft/sec, indicative of household ambulator General Gait Details: Pt ambulates with decreased gait speed and slight side-to-side movements throughout ambulation. No LOB and no AD use, but pt benefits from supervision  Stairs            Wheelchair Mobility    Modified Rankin (Stroke Patients Only)       Balance Overall balance assessment: No apparent balance deficits (not formally assessed)   Sitting balance-Leahy Scale: Normal       Standing balance-Leahy Scale: Fair                               Pertinent Vitals/Pain Pain Assessment: No/denies pain    Home Living Family/patient expects to be discharged to:: Private residence Living Arrangements: Alone(widowed)   Type of Home: House Home Access: Ramped entrance     Home Layout: One level Home Equipment: [place] - single point;Shower seat      Prior Function Level of Independence: Needs assistance   Gait / Transfers Assistance Needed: Pt reports ambulating long distances with supervision of daughter or neighbor, reports he did not use AD.  ADL's / Homemaking Assistance Needed: Pt reports daughters and neighbor assist with cooking, cleaning, or laundry following initial fx, prior to fall he was independent.        Hand Dominance   Dominant Hand: Right    Extremity/Trunk Assessment   Upper Extremity Assessment Upper Extremity Assessment: Overall WFL for tasks assessed(other than RUE which was not assessed due to NWB status)    Lower Extremity Assessment Lower Extremity  Assessment: Overall WFL for tasks assessed    Cervical / Trunk Assessment Cervical / Trunk Assessment: Normal  Communication   Communication: No difficulties  Cognition Arousal/Alertness: Awake/alert Behavior During Therapy: WFL for tasks assessed/performed Overall Cognitive Status: Within Functional Limits for tasks assessed                                        General Comments       Exercises     Assessment/Plan    PT Assessment Patient needs continued PT services  PT Problem List Decreased strength;Decreased range of motion;Decreased balance       PT Treatment Interventions Gait training;Balance training;Functional mobility training    PT Goals (Current goals can be found in the Care Plan section)  Acute Rehab PT Goals Patient Stated Goal: return home PT Goal Formulation: With patient Time For Goal Achievement: 07/18/19 Potential to Achieve Goals: Good    Frequency Min 3X/week   Barriers to discharge        Co-evaluation               AM-PAC PT "6 Clicks" Mobility  Outcome Measure Help needed turning from your back to your side while in a flat bed without using bedrails?: None Help needed moving from lying on your back to sitting on the side of a flat bed without using bedrails?: None Help needed moving to and from a bed to a chair (including a wheelchair)?: A Little Help needed standing up from a chair using your arms (e.g., wheelchair or bedside chair)?: A Little Help needed to walk in hospital room?: A Little Help needed climbing 3-5 steps with a railing? : A Lot 6 Click Score: 19    End of Session Equipment Utilized During Treatment: Gait belt Activity Tolerance: Patient tolerated treatment well Patient left: in chair;with call bell/phone within reach   PT Visit Diagnosis: Unsteadiness on feet (R26.81);Repeated falls (R29.6);History of falling (Z91.81)    Time: [id number] PT Time Calculation (min) (ACUTE ONLY): 27 min   Charges:   PT Evaluation $PT Eval Low Complexity: 1 Low PT Treatments $Gait Training: 8-22 mins        Mickey Farber, PT, DPT

## 2019-07-04 NOTE — Evaluation (Signed)
Occupational Therapy Evaluation and Discharge Patient Details Name: [name] MRN: [id number] DOB: 10-30-1930 Today's Date: 07/04/2019    History of Present Illness The patient is a pleasant 85 yo male presenting today s/p ORIF (10/15) of R humerus fx he sustained in a fall at his home. PMH significant for CAD, HTN, and pacemaker placement (2009).   Clinical Impression   Pt typically functions independently. He has been more reliant on his daughters and neighbors to assist with ADL and IADL since his fall. Pt educated in compensatory strategies for ADL and edema management techniques. Adjusted R UE splint. Pt is eager to go home.    Follow Up Recommendations  Follow surgeon's recommendation for DC plan and follow-up therapies    Equipment Recommendations  None recommended by OT    Recommendations for Other Services       Precautions / Restrictions Precautions Precautions: Fall Required Braces or Orthoses: Splint/Cast Splint/Cast: sling for RUE Restrictions Weight Bearing Restrictions: Yes RUE Weight Bearing: Non weight bearing      Mobility Bed Mobility               General bed mobility comments: pt received in chair  Transfers Overall transfer level: Needs assistance Equipment used: Straight cane Transfers: Sit to/from Stand Sit to Stand: Supervision         General transfer comment: for safety, no physical assist    Balance Overall balance assessment: No apparent balance deficits (not formally assessed)   Sitting balance-Leahy Scale: Normal       Standing balance-Leahy Scale: Fair                             ADL either performed or assessed with clinical judgement   ADL Overall ADL's : Needs assistance/impaired Eating/Feeding: Minimal assistance;Sitting Eating/Feeding Details (indicate cue type and reason): with L UE Grooming: Wash/dry hands;Supervision/safety;Standing   Upper Body Bathing: Moderate assistance;Sitting   Lower  Body Bathing: Sit to/from stand;Minimal assistance   Upper Body Dressing : Moderate assistance;Sitting   Lower Body Dressing: Sit to/from stand;Maximal assistance   Toilet Transfer: Supervision/safety           Functional mobility during ADLs: Supervision/safety General ADL Comments: educated pt in compensatory strategies for ADL and adjusted sling to fit properly     Vision Baseline Vision/History: Wears glasses Wears Glasses: Reading only       Perception     Praxis      Pertinent Vitals/Pain Pain Assessment: Faces Faces Pain Scale: Hurts little more Pain Location: R UE Pain Descriptors / Indicators: Sore;Heaviness Pain Intervention(s): Monitored during session;Repositioned;Ice applied     Hand Dominance Right   Extremity/Trunk Assessment Upper Extremity Assessment Upper Extremity Assessment: RUE deficits/detail RUE Deficits / Details: splinted from shoulder to MPs, instructed in edema management--elevation, ice and moving fingers and shoulder RUE: Unable to fully assess due to immobilization RUE Coordination: decreased fine motor;decreased gross motor   Lower Extremity Assessment Lower Extremity Assessment: Defer to PT evaluation   Cervical / Trunk Assessment Cervical / Trunk Assessment: Normal   Communication Communication Communication: No difficulties   Cognition Arousal/Alertness: Awake/alert Behavior During Therapy: WFL for tasks assessed/performed Overall Cognitive Status: Within Functional Limits for tasks assessed                                     General Comments  Exercises     Shoulder Instructions      Home Living Family/patient expects to be discharged to:: Private residence Living Arrangements: Alone Available Help at Discharge: Family;Neighbor;Available PRN/intermittently Type of Home: House Home Access: Ramped entrance     Home Layout: One level     Bathroom Shower/Tub: Engineer, site: Handicapped height Bathroom Accessibility: Yes   Home Equipment: [place] - single point;Shower seat          Prior Functioning/Environment Level of Independence: Needs assistance  Gait / Transfers Assistance Needed: Pt reports ambulating long distances with supervision of daughter or neighbor, reports he did not use AD. ADL's / Homemaking Assistance Needed: Pt reports daughters and neighbor assist with cooking, cleaning, or laundry following initial fx, prior to fall he was independent.            OT Problem List:        OT Treatment/Interventions:      OT Goals(Current goals can be found in the care plan section) Acute Rehab OT Goals Patient Stated Goal: return home  OT Frequency:     Barriers to D/C:            Co-evaluation              AM-PAC OT "6 Clicks" Daily Activity     Outcome Measure Help from another person eating meals?: A Little Help from another person taking care of personal grooming?: A Little Help from another person toileting, which includes using toliet, bedpan, or urinal?: A Little Help from another person bathing (including washing, rinsing, drying)?: A Little Help from another person to put on and taking off regular upper body clothing?: A Lot Help from another person to put on and taking off regular lower body clothing?: A Little 6 Click Score: 17   End of Session    Activity Tolerance: Patient tolerated treatment well Patient left: in chair;with call bell/phone within reach  OT Visit Diagnosis: Pain                Time: [id number] OT Time Calculation (min): 26 min Charges:  OT General Charges $OT Visit: 1 Visit OT Evaluation $OT Eval Low Complexity: 1 Low OT Treatments $Self Care/Home Management : 8-22 mins  Nestor Lewandowsky, OTR/L Acute Rehabilitation Services Pager: [phone] Office: [phone]  Malka So 07/04/2019, 10:34 AM

## 2019-07-04 NOTE — Progress Notes (Signed)
   Ortho Hand Progress Note  Subjective: No acute events last night. Pain well controlled. Block has worn off at this point. Denies chest pain or SOB.   Objective: Vital signs in last 24 hours: Temp:  [97 F (36.1 C)-98.6 F (37 C)] 98.6 F (37 C) (10/16 0734) Pulse Rate:  [61-95] 79 (10/16 0734) Resp:  [13-27] 18 (10/16 0734) BP: (113-195)/(58-90) 144/67 (10/16 0734) SpO2:  [90 %-100 %] 94 % (10/16 0734) Weight:  [81.6 kg] 81.6 kg (10/15 1146)  Intake/Output from previous day: 10/15 0701 - 10/16 0700 In: 1460 [P.O.:360; I.V.:1000; IV Piggyback:100] Out: 1670 [Urine:1625; Drains:15; Blood:30] Intake/Output this shift: No intake/output data recorded.  Recent Labs    07/03/19 1229 07/03/19 2020  HGB 11.6* 12.1*   Recent Labs    07/03/19 1229 07/03/19 2020  WBC 8.9 12.9*  RBC 3.43* 3.58*  HCT 35.3* 36.3*  PLT 300 300   Recent Labs    07/03/19 1229 07/03/19 2020  NA 133* 136  K 4.5 4.6  CL 100 102  CO2 23 23  BUN 20 17  CREATININE 1.01 1.04  GLUCOSE 99 123*  CALCIUM 9.1 8.9   No results for input(s): LABPT, INR in the last 72 hours.  Aaox3 nad Resp nonlabored RRR Splint on right arm. Dressings c/d/i. Drain with serosang output. Motor intact ain/pin/u. SILT m/u/r. Fingers wwp with bcr.   Assessment/Plan: Right displaced distal humerus fracture s/p ORIF, POD1  - Patient doing well today. Plan for discharge home after pt/ot - continue right arm elevation - Splint at all times. NWB RUE - OK for digit motion - Drain pulled - post op abx - PO plus IV pain meds as needed - PT/OT eval today  Dispo: plan for d/c home today after pt/ot   Avanell Shackleton III 07/04/2019, 7:49 AM  [phone]

## 2019-07-04 NOTE — Discharge Summary (Signed)
Patient ID: [name] MRN: [id number] DOB/AGE: May 01, 1931 85 y.o.  Admit date: 07/03/2019 Discharge date: 07/04/2019  Admission Diagnoses: Right distal humerus fracture Past Medical History:  Diagnosis Date  . Carotid artery disease ([place])   . Coronary artery disease    PCI RCA 2009  . Dyslipidemia   . Hypertension   . Scleritis, unspecified    treated with prednisone  . Seizure [facility])    felt due to cyclosporin in setting of electrolyte disorder  . Solitary kidney   . Symptomatic bradycardia    a. s/p STJ dual chamber pacemaker    Discharge Diagnoses:  Active Problems:   Closed fracture of right distal humerus   Surgeries: Procedure(s): Right distal humerus open reduction, internal fixation on 07/03/2019    Consultants:   Discharged Condition: Improved  Hospital Course: [name] is an 85 y.o. male who was admitted 07/03/2019 with a chief complaint of a right distal humerus fracture, and found to have a diagnosis of Right distal humerus fracture.  They were brought to the operating room on 07/03/2019 and underwent Procedure(s): Right distal humerus open reduction, internal fixation.    They were given perioperative antibiotics:  Anti-infectives (From admission, onward)   Start     Dose/Rate Route Frequency Ordered Stop   07/03/19 2200  ceFAZolin (ANCEF) IVPB 1 g/50 mL premix     1 g 100 mL/hr over 30 Minutes Intravenous Every 8 hours 07/03/19 2007 07/04/19 2159   07/03/19 1200  ceFAZolin (ANCEF) IVPB 2g/100 mL premix     2 g 200 mL/hr over 30 Minutes Intravenous On call to O.R. 07/03/19 1151 07/03/19 1500   07/03/19 1146  ceFAZolin (ANCEF) 2-4 GM/100ML-% IVPB    Note to Pharmacy: Tressia Miners Ch: cabinet override      07/03/19 1146 07/03/19 1500    .  They were given sequential compression devices, early ambulation for DVT prophylaxis.  Recent vital signs:  Patient Vitals for the past 24 hrs:  BP Temp Temp src Pulse Resp SpO2 Height Weight   07/04/19 0734 (!) 144/67 98.6 F (37 C) Oral 79 18 94 % - -  07/04/19 0442 115/90 98.6 F (37 C) - 80 18 94 % - -  07/03/19 2341 (!) 144/70 98.6 F (37 C) Oral 91 16 90 % - -  07/03/19 1949 (!) 165/80 97.7 F (36.5 C) Oral 95 18 91 % - -  07/03/19 1923 139/64 (!) 97 F (36.1 C) - 92 (!) 21 92 % - -  07/03/19 1908 (!) 171/67 - - 87 (!) 24 94 % - -  07/03/19 1853 128/77 - - 86 20 94 % - -  07/03/19 1838 (!) 177/84 - - 92 (!) 27 100 % - -  07/03/19 1823 (!) 165/75 - - 79 (!) 21 100 % - -  07/03/19 1808 (!) 158/81 - - 79 (!) 23 90 % - -  07/03/19 1756 (!) 148/69 - - 76 20 95 % - -  07/03/19 1741 (!) 148/69 - - 88 13 98 % - -  07/03/19 1726 120/66 - - 68 15 94 % - -  07/03/19 1711 (!) 113/58 (!) 97 F (36.1 C) - 61 18 99 % - -  07/03/19 1146 (!) 195/82 97.6 F (36.4 C) Oral 78 20 99 % 5\' 10"  (1.778 m) 81.6 kg  .  Recent laboratory studies: Dg Elbow 2 Views Right  Result Date: 07/03/2019 CLINICAL DATA:  Right distal humerus fracture, ORIF EXAM: RIGHT ELBOW -  2 VIEW COMPARISON:  06/17/2019 FINDINGS: Patient Is status post ORIF of the right distal humerus comminuted fracture. Anatomic alignment. Displaced fractures remain at the comminuted fracture site in the right distal humerus. Staples noted posteriorly and a surgical drain. Elbow appears aligned. Diffuse soft tissue swelling. Limited exam because of positioning. IMPRESSION: Improved alignment status post ORIF of the right distal humerus comminuted fracture. Electronically Signed   By: Jerilynn Mages.  Shick M.D.   On: 07/03/2019 19:10    Discharge Medications:   Allergies as of 07/04/2019      Reactions   Cyclosporine    unknown   Maxzide [triamterene-hctz] Other (See Comments)   Adverse reaction-bloating and abdominal pain   Sulfur Other (See Comments)   No energy, could not walk    Prednisone Other (See Comments)   Can tolerate for a short period of time Unknown reaction      Medication List    TAKE these medications    acetaminophen 325 MG tablet Commonly known as: TYLENOL Take 650 mg by mouth every 6 (six) hours as needed for mild pain.   amLODipine 5 MG tablet Commonly known as: NORVASC Take 1 tablet (5 mg total) by mouth daily.   aspirin 81 MG tablet Take 81 mg by mouth daily.   atorvastatin 40 MG tablet Commonly known as: LIPITOR TAKE 1 TABLET BY MOUTH ONCE DAILY AT  6PM   carvedilol 3.125 MG tablet Commonly known as: COREG Take 1 tablet (3.125 mg total) by mouth 2 (two) times daily.   beta carotene w/minerals tablet Take 1 tablet by mouth daily.   CENTRUM SILVER PO Take 1 capsule by mouth daily.   clopidogrel 75 MG tablet Commonly known as: PLAVIX Take 1 tablet (75 mg total) by mouth daily. What changed: when to take this   finasteride 5 MG tablet Commonly known as: PROSCAR Take 5 mg by mouth daily.   furosemide 40 MG tablet Commonly known as: LASIX Take 1 tablet (40 mg total) by mouth once a week.   HYDROcodone-acetaminophen 5-325 MG tablet Commonly known as: NORCO/VICODIN Take 1-2 tablets by mouth every 4 (four) hours as needed for moderate pain (pain score 4-6).   metroNIDAZOLE 0.75 % cream Commonly known as: METROCREAM Apply 1 application topically 2 (two) times daily as needed (skin).   Nitrostat 0.4 MG SL tablet Generic drug: nitroGLYCERIN DISSOLVE ONE TABLET UNDER THE TONGUE EVERY 5 MINUTES AS NEEDED FOR CHEST PAIN.  DO NOT EXCEED A TOTAL OF 3 DOSES IN 15 MINUTES What changed: See the new instructions.   oxyCODONE-acetaminophen 5-325 MG tablet Commonly known as: PERCOCET/ROXICET Take 1 tablet by mouth every 6 (six) hours as needed for severe pain.   potassium chloride 10 MEQ tablet Commonly known as: KLOR-CON Take 1 tablet (10 mEq total) by mouth once a week. Take with furosemide   spironolactone 25 MG tablet Commonly known as: ALDACTONE Take 0.5 tablets (12.5 mg total) by mouth daily.   temazepam 15 MG capsule Commonly known as: RESTORIL Take 15 mg by  mouth 2 (two) times daily.       Diagnostic Studies: Dg Shoulder Right  Result Date: 06/17/2019 CLINICAL DATA:  Acute RIGHT shoulder pain following fall. Initial encounter. EXAM: RIGHT SHOULDER - 2+ VIEW COMPARISON:  None. FINDINGS: There is no evidence of fracture or dislocation. There is no evidence of arthropathy or other focal bone abnormality. Soft tissues are unremarkable. IMPRESSION: Negative. Electronically Signed   By: Margarette Canada M.D.   On: 06/17/2019 18:13  Dg Elbow 2 Views Right  Result Date: 07/03/2019 CLINICAL DATA:  Right distal humerus fracture, ORIF EXAM: RIGHT ELBOW - 2 VIEW COMPARISON:  06/17/2019 FINDINGS: Patient Is status post ORIF of the right distal humerus comminuted fracture. Anatomic alignment. Displaced fractures remain at the comminuted fracture site in the right distal humerus. Staples noted posteriorly and a surgical drain. Elbow appears aligned. Diffuse soft tissue swelling. Limited exam because of positioning. IMPRESSION: Improved alignment status post ORIF of the right distal humerus comminuted fracture. Electronically Signed   By: Jerilynn Mages.  Shick M.D.   On: 07/03/2019 19:10   Dg Elbow 2 Views Right  Result Date: 06/17/2019 CLINICAL DATA:  Acute RIGHT elbow pain following fall. Initial encounter. EXAM: RIGHT ELBOW - 2 VIEW COMPARISON:  None. FINDINGS: An oblique comminuted fracture of the distal humeral diaphysis is noted with 1 cm posterior displacement. No other fracture or dislocation. IMPRESSION: Oblique comminuted fracture of the distal humeral diaphysis with 1 cm posterior displacement. Electronically Signed   By: Margarette Canada M.D.   On: 06/17/2019 18:14   Dg Humerus Right  Result Date: 06/17/2019 CLINICAL DATA:  Acute RIGHT shoulder pain following fall today. Initial encounter. EXAM: RIGHT HUMERUS - 2+ VIEW COMPARISON:  None. FINDINGS: An oblique comminuted fracture of the distal humeral diaphysis is noted with 1 cm posterior displacement. No other definite  acute fracture noted. No dislocation noted. IMPRESSION: Oblique comminuted fracture of the distal humerus with 1 cm posterior displacement. Electronically Signed   By: Margarette Canada M.D.   On: 06/17/2019 18:12    They benefited maximally from their hospital stay and there were no complications.     Disposition: Discharge disposition: 01-Home or Self Care      Discharge Instructions    Call MD / Call 911   Complete by: As directed    If you experience chest pain or shortness of breath, CALL 911 and be transported to the hospital emergency room.  If you develope a fever above 101 F, pus (white drainage) or increased drainage or redness at the wound, or calf pain, call your surgeon's office.   Constipation Prevention   Complete by: As directed    Drink plenty of fluids.  Prune juice may be helpful.  You may use a stool softener, such as Colace (over the counter) 100 mg twice a day.  Use MiraLax (over the counter) for constipation as needed.   Diet - low sodium heart healthy   Complete by: As directed    Increase activity slowly as tolerated   Complete by: As directed      Follow-up Information    Avanell Shackleton III, MD. Schedule an appointment as soon as possible for a visit in 2 week(s).   Contact information: [address] [place] [address] [phone]            Signed: Verner Mould, MD  07/04/2019, 7:58 AM

## 2019-07-04 NOTE — TOC Initial Note (Addendum)
Transition of Care [facility]) - Initial/Assessment Note    Patient Details  Name: [name] MRN: [id number] Date of Birth: [birth year]  [facility] CM/SW Contact:    Benard Halsted, LCSW Phone Number: 07/04/2019, 10:11 AM  Clinical Narrative:                 CSW received consult for possible home health services at time of discharge. CSW spoke with patient regarding PT recommendation of Home Health PT at time of discharge. Patient reported that he would like home health services.  Patient agreeable for [facility]. CSW sent referral for review and it has been accepted. CSW provided Medicare SNF ratings list. CSW discussed equipment needs with patient and he denied needs CSW confirmed PCP and address with patient. Patient states his daughter will come pick him up at discharge. CSW has messaged MD for Home health PT order and Face to Face. No further questions reported at this time.    Expected Discharge Plan: [place] Barriers to Discharge: No Barriers Identified   Patient Goals and CMS Choice Patient states their goals for this hospitalization and ongoing recovery are:: Return home CMS Medicare.gov Compare Post Acute Care list provided to:: Patient Choice offered to / list presented to : Patient  Expected Discharge Plan and Services Expected Discharge Plan: [place] In-house Referral: NA Discharge Planning Services: CM Consult Post Acute Care Choice: [place] arrangements for the past 2 months: Single Family Home Expected Discharge Date: 07/04/19               DME Arranged: N/A DME Agency: NA       HH Arranged: PT [place] Agency: [place] ([place]) Date HH Agency Contacted: 07/04/19 Time HH Agency Contacted: 1010 Representative spoke with at [place]: Mateo Flow  Prior Living Arrangements/Services Living arrangements for the past 2 months: [place] with:: Self, Adult Children Patient  language and need for interpreter reviewed:: Yes Do you feel safe going back to the place where you live?: Yes      Need for Family Participation in Patient Care: No (Comment) Care giver support system in place?: Yes (comment) Current home services: DME Criminal Activity/Legal Involvement Pertinent to Current Situation/Hospitalization: No - Comment as needed  Activities of Daily Living Home Assistive Devices/Equipment: Environmental consultant (specify type), Cane (specify quad or straight), Eyeglasses, Dentures (specify type), Grab bars in shower, Blood pressure cuff(dentures, upper lower) ADL Screening (condition at time of admission) Patient's cognitive ability adequate to safely complete daily activities?: Yes Is the patient deaf or have difficulty hearing?: No Does the patient have difficulty seeing, even when wearing glasses/contacts?: No Does the patient have difficulty concentrating, remembering, or making decisions?: No Patient able to express need for assistance with ADLs?: Yes Does the patient have difficulty dressing or bathing?: Yes Independently performs ADLs?: No Communication: Independent Dressing (OT): Needs assistance Grooming: Needs assistance Feeding: Independent Bathing: Needs assistance Is this a change from baseline?: Pre-admission baseline Toileting: Needs assistance Is this a change from baseline?: Pre-admission baseline Walks in Home: Independent with device (comment)(walker or cane) Does the patient have difficulty walking or climbing stairs?: Yes Weakness of Legs: None Weakness of Arms/Hands: None  Permission Sought/Granted Permission sought to share information with : Investment banker, corporate granted to share info w AGENCY: Home Health        Emotional Assessment Appearance:: Appears stated age Attitude/Demeanor/Rapport: Engaged  Affect (typically observed): Accepting, Appropriate, Pleasant Orientation: : Oriented to Self, Oriented to Place,  Oriented to  Time, Oriented to Situation Alcohol / Substance Use: Not Applicable Psych Involvement: No (comment)  Admission diagnosis:  Right distal humerus fracture Patient Active Problem List   Diagnosis Date Noted  . Closed fracture of right distal humerus 07/03/2019  . Carotid artery disease ([place]) 09/09/2018  . Pacemaker-St.Jude 07/03/2012  . Bruit 09/28/2011  . Tongue lesion 08/15/2011  . HYPERTENSION, BENIGN 06/23/2010  . Hyperlipidemia LDL goal <70 09/22/2008  . CAD, NATIVE VESSEL 09/22/2008  . Edema 09/22/2008  . SSS (sick sinus syndrome) ([place]) 09/22/2008   PCP:  Shirline Frees, MD Pharmacy:   [place], [place] - [address] [place] [address] Phone: [phone] Fax: [phone]  [place], [place]. [address] [facility] [address] Phone: [phone] Fax: [phone]  [facility] - [place], [place] - [address] [place] [address] Phone: [phone] Fax: [phone]     Social Determinants of Health (SDOH) Interventions    Readmission Risk Interventions No flowsheet data found.

## 2019-07-04 NOTE — Discharge Instructions (Signed)
Discharge Instructions ° °- Keep dressings in place. Do not remove them. °- The dressings must stay dry °- Take all medication as prescribed. Transition to over the counter pain medication as your pain improves °- Keep the hand elevated over the next 48-72 hours to help with pain and swelling °- Move all digits not restricted by the dressings regularly to prevent stiffness °- Please call to schedule a follow up appointment with Dr. Xayvier Vallez and therapy at [phone] for 10-14 days following surgery °- Your pain medication have been send digitally to your pharmacy °

## 2019-07-07 ENCOUNTER — Encounter (HOSPITAL_COMMUNITY): Payer: Self-pay | Admitting: Orthopaedic Surgery

## 2019-07-10 ENCOUNTER — Other Ambulatory Visit: Payer: Self-pay | Admitting: Internal Medicine

## 2019-07-11 MED ORDER — POTASSIUM CHLORIDE ER 10 MEQ PO TBCR: 10.0000 meq | EXTENDED_RELEASE_TABLET | ORAL | 3 refills | Status: DC

## 2019-07-11 MED ORDER — FUROSEMIDE 40 MG PO TABS: 40.0000 mg | ORAL_TABLET | ORAL | 3 refills | Status: DC

## 2019-07-14 ENCOUNTER — Telehealth: Payer: Self-pay | Admitting: Internal Medicine

## 2019-07-14 NOTE — Telephone Encounter (Signed)
Pt c/o medication issue:  1. Name of Medication: furosemide (LASIX) 40 MG tablet  potassium chloride (KLOR-CON) 10 MEQ tablet  2. How are you currently taking this medication (dosage and times per day)? 3x a week instead of 1x a week  3. Are you having a reaction (difficulty breathing--STAT)? no  4. What is your medication issue? Reduced swelling  Patient was taking these medications 1x a week for swelling, but was recently told to increase the dosage to 3x a week. The swelling has gone down, and he wants to know if Dr. Rayann Heman wants  Him to continue taking the medicine 3x a week, or go back to only 1x a week  Pt c/o swelling: STAT is pt has developed SOB within 24 hours  1) How much weight have you gained and in what time span? Pt lost about 5 lbs  2) If swelling, where is the swelling located? Feet and legs  3) Are you currently taking a fluid pill? yes  4) Are you currently SOB? no  5) Do you have a log of your daily weights (if so, list)?   6) Have you gained 3 pounds in a day or 5 pounds in a week? no  7) Have you traveled recently? no

## 2019-07-14 NOTE — Telephone Encounter (Signed)
Spoke with [name].  At last OV with Dr. Hosie Poisson was having some ankle edema.  Dr. Rayann Heman asked [name] to increase lasix/K+ regimen to 3x a week for 2 weeks then advised to call in to report.  Per [name] he has had improvement in swelling, but not completely resolved.  States "about 2/3 of the way gone"  Advised [name] to continue 3x weekly regimen for one more week and then call this nurse to report.  Advised would let Dr. Rayann Heman know.  [name] in agreement.  Will call next week.

## 2019-07-14 NOTE — Telephone Encounter (Signed)
The patient is also Dr Harrington Challenger' patient. I would defer further diuretic management to her.

## 2019-07-16 NOTE — Telephone Encounter (Signed)
Spoke with patient.  He cannot come in the morning but will be able to come for a 12:00 noon appointment.  Pt appreciative for the call/plan.

## 2019-07-16 NOTE — Telephone Encounter (Signed)
Can pt get added on for Friday?   WIll examine and get labs   His visit with allred was a televisit

## 2019-07-17 NOTE — Progress Notes (Signed)
Cardiology Office Note   Date:  07/18/2019   ID:  CHAS BRANAN, DOB 07/15/1931, MRN [id number]  PCP:  Shirline Frees, MD  Cardiologist:   Dorris Carnes, MD   F/U of CAD   History of Present Illness: [name] is a 85 y.o. male with a history of CAD s/p rotational atherectomy and DES to RCA in 2009, SSS s/p PPM in 2009, hypertension, hyperlipidemia, and solitary kidney. Left heart catheterization in 2009 showed 90-95% stenosis of the proximal to mid RCA followed by 90% stenosis and then 40 to 60% stenosis in the distal RCA, 70-80% stenosis of the LAD after D2, 40 to 50% stenosis at D1, 60 to 70% of oD2, 40% of oCFX, 80% of OM1, and 30 to 40% of AVCFX. Patient underwent atherectomy and PCI of the RCA.   The pt was seen by B Bhagat in July    He was also seen by J Alrred in October 2020   COmplained of some LE edema The patient says he is doing okay on the current regimen of Lasix.  He denies shortness of breath.  No chest pain.  No dizziness.  Still has some ankle edema.  He is using mild support socks.  The patient broke his arm a few weeks ago.  He denied dizziness.  Michela Pitcher he was walking through his backyard and tripped hose.  Followed in orthopedic surgery.      No outpatient medications have been marked as taking for the 07/18/19 encounter (Office Visit) with Fay Records, MD.     Allergies:   Cyclosporine, Maxzide [triamterene-hctz], Sulfa antibiotics, Sulfur, and Prednisone   Past Medical History:  Diagnosis Date  . Carotid artery disease ([place])   . Coronary artery disease    PCI RCA 2009  . Dyslipidemia   . Hypertension   . Scleritis, unspecified    treated with prednisone  . Seizure [facility])    felt due to cyclosporin in setting of electrolyte disorder  . Solitary kidney   . Symptomatic bradycardia    a. s/p STJ dual chamber pacemaker    Past Surgical History:  Procedure Laterality Date  . CARDIAC CATHETERIZATION     12 years ago?  . COLONOSCOPY    . EYE  SURGERY     right eye about 10 years ago  . ORIF HUMERUS FRACTURE Right 07/03/2019   Procedure: Right distal humerus open reduction, internal fixation;  Surgeon: Verner Mould, MD;  Location: [facility];  Service: Orthopedics;  Laterality: Right;  . PACEMAKER INSERTION  2009   STJ dual chamber pacemaker implanted by Dr Olevia Perches for symptomatic bradycardia     Social History:  The patient  reports that he has never smoked. He has never used smokeless tobacco. He reports current alcohol use. He reports that he does not use drugs.   Family History:  The patient's family history includes Arthritis/Rheumatoid in his mother.    ROS:  Please see the history of present illness. All other systems are reviewed and  Negative to the above problem except as noted.    PHYSICAL EXAM: VS:  BP (!) 150/76   Pulse 77   Ht 5\' 10"  (1.778 m)   Wt 183 lb (83 kg)   SpO2 96%   BMI 26.26 kg/m   GEN: Well nourished, well developed, in no acute distress  HEENT: normal  Neck:  JVP is normal   No carotid bruits Cardiac: RRR; no murmurs, rubs, or gallops, 1-2+ edema  Respiratory:  clear to auscultation bilaterally, normal work of breathing GI: soft, nontender, nondistended, + BS  No hepatomegaly  MS: R arm in brace  Skin: warm and dry, no rash Neuro:  Strength and sensation are intact Psych: euthymic mood, full affect   EKG:  EKG is ordered today.   Lipid Panel    Component Value Date/Time   CHOL 135 06/29/2009 1230   TRIG 37.0 06/29/2009 1230   HDL 63.70 06/29/2009 1230   CHOLHDL 2 06/29/2009 1230   VLDL 7.4 06/29/2009 1230   LDLCALC 64 06/29/2009 1230      Wt Readings from Last 3 Encounters:  07/18/19 183 lb (83 kg)  07/03/19 179 lb 14.3 oz (81.6 kg)  06/30/19 180 lb (81.6 kg)      ASSESSMENT AND PLAN:  1  CAD  The pt has  no symptoms to suggest angina.  Follow.  2  Edema Will check labs today.  The patient does have edema on exam.  I discussed salt limitation.  He is very  comfortable.  Otherwise, his volume appears to be okay.  I would hold off on further cardiac testing for now since he is so comfortable.  3  HTN   BP is mildly increased it was in July as well.  Follow for right now.  4  Hx sick sinus syntdrom patient followed in cardiology clinic by Thompson Grayer.  Patient status post permanent pacemaker placement.     Current medicines are reviewed at length with the patient today.  The patient does not have concerns regarding medicines.  Signed, Dorris Carnes, MD  07/18/2019 12:31 PM    [facility] [place], [place], [place]  [address] Phone: [phone]; Fax: [phone]

## 2019-07-18 ENCOUNTER — Other Ambulatory Visit: Payer: Self-pay

## 2019-07-18 ENCOUNTER — Encounter: Payer: Self-pay | Admitting: Internal Medicine

## 2019-07-18 ENCOUNTER — Ambulatory Visit (INDEPENDENT_AMBULATORY_CARE_PROVIDER_SITE_OTHER): Payer: Medicare Other | Admitting: Internal Medicine

## 2019-07-18 VITALS — BP 150/76 | HR 77 | Ht 70.0 in | Wt 183.0 lb

## 2019-07-18 DIAGNOSIS — E785 Hyperlipidemia, unspecified: Secondary | ICD-10-CM

## 2019-07-18 DIAGNOSIS — I6523 Occlusion and stenosis of bilateral carotid arteries: Secondary | ICD-10-CM | POA: Diagnosis not present

## 2019-07-18 DIAGNOSIS — R609 Edema, unspecified: Secondary | ICD-10-CM

## 2019-07-18 DIAGNOSIS — Z5189 Encounter for other specified aftercare: Secondary | ICD-10-CM | POA: Diagnosis not present

## 2019-07-18 DIAGNOSIS — S42411A Displaced simple supracondylar fracture without intercondylar fracture of right humerus, initial encounter for closed fracture: Secondary | ICD-10-CM | POA: Diagnosis not present

## 2019-07-18 DIAGNOSIS — I2510 Atherosclerotic heart disease of native coronary artery without angina pectoris: Secondary | ICD-10-CM

## 2019-07-18 NOTE — Patient Instructions (Signed)
Medication Instructions:  No changes *If you need a refill on your cardiac medications before your next appointment, please call your pharmacy*  Lab Work: Today: lipids, bmet, bnp If you have labs (blood work) drawn today and your tests are completely normal, you will receive your results only by: Marland Kitchen MyChart Message (if you have MyChart) OR . A paper copy in the mail If you have any lab test that is abnormal or we need to change your treatment, we will call you to review the results.  Testing/Procedures: none   Follow-Up: At [facility], you and your health needs are our priority.  As part of our continuing mission to provide you with exceptional heart care, we have created designated Provider Care Teams.  These Care Teams include your primary Cardiologist (physician) and Advanced Practice Providers (APPs -  Physician Assistants and Nurse Practitioners) who all work together to provide you with the care you need, when you need it.  Your next appointment:   4 months  The format for your next appointment:   In Person  Provider:   Dorris Carnes, MD  Other Instructions

## 2019-07-19 LAB — LIPID PANEL
Chol/HDL Ratio: 2.3 ratio (ref 0.0–5.0)
Cholesterol, Total: 149 mg/dL (ref 100–199)
HDL: 65 mg/dL (ref >39)
LDL Chol Calc (NIH): 70 mg/dL (ref 0–99)
Triglycerides: 73 mg/dL (ref 0–149)
VLDL Cholesterol Cal: 14 mg/dL (ref 5–40)

## 2019-07-19 LAB — BASIC METABOLIC PANEL
BUN/Creatinine Ratio: 18 (ref 10–24)
BUN: 21 mg/dL (ref 8–27)
CO2: 25 mmol/L (ref 20–29)
Calcium: 10.1 mg/dL (ref 8.6–10.2)
Chloride: 97 mmol/L (ref 96–106)
Creatinine, Ser: 1.14 mg/dL (ref 0.76–1.27)
GFR calc Af Amer: 67 mL/min/{1.73_m2} (ref >59)
GFR calc non Af Amer: 58 mL/min/{1.73_m2} — ABNORMAL LOW (ref >59)
Glucose: 115 mg/dL — ABNORMAL HIGH (ref 65–99)
Potassium: 4.4 mmol/L (ref 3.5–5.2)
Sodium: 136 mmol/L (ref 134–144)

## 2019-07-19 LAB — PRO B NATRIURETIC PEPTIDE: NT-Pro BNP: 156 pg/mL (ref 0–486)

## 2019-07-20 ENCOUNTER — Other Ambulatory Visit: Payer: Self-pay | Admitting: Internal Medicine

## 2019-07-22 ENCOUNTER — Telehealth: Payer: Self-pay

## 2019-07-22 DIAGNOSIS — R609 Edema, unspecified: Secondary | ICD-10-CM

## 2019-07-22 NOTE — Telephone Encounter (Signed)
The patient has been notified of the result and verbalized understanding. He is going to take his Lasix 40 mg twice per week and will come back for repeat labs (BMET) on 11/24. Patient states he has been doing good with limiting his salt intake.

## 2019-07-22 NOTE — Telephone Encounter (Signed)
-----   Message from Fay Records, MD sent at 07/19/2019  4:40 PM EDT ----- Electrolytes and kidney function are OK FLuid overall appears normal Lipids are excellent He is taking lasix 40 mg 1x per week   With swelling he can try 2x per wk (every 3rd day)   Watch/limit salt CHeck BMET in 3 wks if make change

## 2019-07-25 DIAGNOSIS — M25521 Pain in right elbow: Secondary | ICD-10-CM | POA: Diagnosis not present

## 2019-07-30 DIAGNOSIS — M25521 Pain in right elbow: Secondary | ICD-10-CM | POA: Diagnosis not present

## 2019-07-31 ENCOUNTER — Telehealth: Payer: Self-pay | Admitting: Internal Medicine

## 2019-07-31 NOTE — Telephone Encounter (Signed)
Spoke with patient's daughter. Device hangs around the neck and functions as a cell phone. It stays powered off and not transmitting unless he needs it, then he can turn it on and make an emergency call. Pt is not device dependent, rarely V paces. I advised should be ok.  Chanetta Marshall, NP 07/31/2019 5:20 PM

## 2019-07-31 NOTE — Telephone Encounter (Signed)
New Message  Patient is getting a medical alert system set up and the instructions state that "if you have a pacemaker to consult doctor". Patient's daughter is calling in to approve getting the medical alert system. Please give patient's daughter a call back to discuss.

## 2019-08-06 DIAGNOSIS — M25521 Pain in right elbow: Secondary | ICD-10-CM | POA: Diagnosis not present

## 2019-08-11 ENCOUNTER — Other Ambulatory Visit: Payer: Medicare Other | Admitting: *Deleted

## 2019-08-11 ENCOUNTER — Other Ambulatory Visit: Payer: Self-pay

## 2019-08-11 DIAGNOSIS — R609 Edema, unspecified: Secondary | ICD-10-CM | POA: Diagnosis not present

## 2019-08-11 DIAGNOSIS — M25521 Pain in right elbow: Secondary | ICD-10-CM | POA: Diagnosis not present

## 2019-08-12 ENCOUNTER — Other Ambulatory Visit: Payer: Medicare Other

## 2019-08-12 LAB — BASIC METABOLIC PANEL
BUN/Creatinine Ratio: 19 (ref 10–24)
BUN: 22 mg/dL (ref 8–27)
CO2: 27 mmol/L (ref 20–29)
Calcium: 8.9 mg/dL (ref 8.6–10.2)
Chloride: 101 mmol/L (ref 96–106)
Creatinine, Ser: 1.16 mg/dL (ref 0.76–1.27)
GFR calc Af Amer: 66 mL/min/{1.73_m2} (ref >59)
GFR calc non Af Amer: 57 mL/min/{1.73_m2} — ABNORMAL LOW (ref >59)
Glucose: 155 mg/dL — ABNORMAL HIGH (ref 65–99)
Potassium: 4.4 mmol/L (ref 3.5–5.2)
Sodium: 141 mmol/L (ref 134–144)

## 2019-08-18 ENCOUNTER — Telehealth: Payer: Self-pay

## 2019-08-18 DIAGNOSIS — M25521 Pain in right elbow: Secondary | ICD-10-CM | POA: Diagnosis not present

## 2019-08-18 NOTE — Telephone Encounter (Signed)
-----   Message from Fay Records, MD sent at 08/12/2019  4:19 PM EST ----- Electrolytes and kidney function are OK Keep on same meds

## 2019-08-18 NOTE — Telephone Encounter (Signed)
The patient has been notified of the result and verbalized understanding.  All questions (if any) were answered. Wilma Flavin, RN 08/18/2019 2:29 PM

## 2019-08-19 DIAGNOSIS — Z5189 Encounter for other specified aftercare: Secondary | ICD-10-CM | POA: Diagnosis not present

## 2019-08-19 DIAGNOSIS — S42411A Displaced simple supracondylar fracture without intercondylar fracture of right humerus, initial encounter for closed fracture: Secondary | ICD-10-CM | POA: Diagnosis not present

## 2019-08-21 DIAGNOSIS — M25521 Pain in right elbow: Secondary | ICD-10-CM | POA: Diagnosis not present

## 2019-08-25 DIAGNOSIS — M25521 Pain in right elbow: Secondary | ICD-10-CM | POA: Diagnosis not present

## 2019-08-27 DIAGNOSIS — Z5189 Encounter for other specified aftercare: Secondary | ICD-10-CM | POA: Diagnosis not present

## 2019-08-27 DIAGNOSIS — S42411A Displaced simple supracondylar fracture without intercondylar fracture of right humerus, initial encounter for closed fracture: Secondary | ICD-10-CM | POA: Diagnosis not present

## 2019-09-01 ENCOUNTER — Other Ambulatory Visit: Payer: Self-pay | Admitting: Internal Medicine

## 2019-09-22 DIAGNOSIS — M25521 Pain in right elbow: Secondary | ICD-10-CM | POA: Diagnosis not present

## 2019-10-02 DIAGNOSIS — M25521 Pain in right elbow: Secondary | ICD-10-CM | POA: Diagnosis not present

## 2019-10-06 ENCOUNTER — Ambulatory Visit: Payer: Medicare Other | Admitting: Internal Medicine

## 2019-10-08 DIAGNOSIS — Z961 Presence of intraocular lens: Secondary | ICD-10-CM | POA: Diagnosis not present

## 2019-10-08 DIAGNOSIS — H524 Presbyopia: Secondary | ICD-10-CM | POA: Diagnosis not present

## 2019-10-08 DIAGNOSIS — H52203 Unspecified astigmatism, bilateral: Secondary | ICD-10-CM | POA: Diagnosis not present

## 2019-10-08 DIAGNOSIS — M25521 Pain in right elbow: Secondary | ICD-10-CM | POA: Diagnosis not present

## 2019-10-08 DIAGNOSIS — H179 Unspecified corneal scar and opacity: Secondary | ICD-10-CM | POA: Diagnosis not present

## 2019-10-08 DIAGNOSIS — H5213 Myopia, bilateral: Secondary | ICD-10-CM | POA: Diagnosis not present

## 2019-10-09 DIAGNOSIS — M25521 Pain in right elbow: Secondary | ICD-10-CM | POA: Diagnosis not present

## 2019-10-11 ENCOUNTER — Other Ambulatory Visit: Payer: Self-pay | Admitting: Internal Medicine

## 2019-10-13 DIAGNOSIS — M25521 Pain in right elbow: Secondary | ICD-10-CM | POA: Diagnosis not present

## 2019-10-16 DIAGNOSIS — M25521 Pain in right elbow: Secondary | ICD-10-CM | POA: Diagnosis not present

## 2019-10-20 DIAGNOSIS — M25521 Pain in right elbow: Secondary | ICD-10-CM | POA: Diagnosis not present

## 2019-10-22 DIAGNOSIS — M25521 Pain in right elbow: Secondary | ICD-10-CM | POA: Diagnosis not present

## 2019-10-27 ENCOUNTER — Ambulatory Visit: Payer: Medicare Other | Attending: Internal Medicine

## 2019-10-27 DIAGNOSIS — M25521 Pain in right elbow: Secondary | ICD-10-CM | POA: Diagnosis not present

## 2019-10-27 DIAGNOSIS — Z23 Encounter for immunization: Secondary | ICD-10-CM | POA: Insufficient documentation

## 2019-10-27 NOTE — Progress Notes (Signed)
   Covid-19 Vaccination Clinic  Name:  [name]    MRN: [id number] DOB: [birth year]  10/27/2019  Mr. Sharratt was observed post Covid-19 immunization for 15 minutes without incidence. He was provided with Vaccine Information Sheet and instruction to access the V-Safe system.   Mr. Ausley was instructed to call 911 with any severe reactions post vaccine: Marland Kitchen Difficulty breathing  . Swelling of your face and throat  . A fast heartbeat  . A bad rash all over your body  . Dizziness and weakness    Immunizations Administered    Name Date Dose VIS Date Route   Pfizer COVID-19 Vaccine 10/27/2019  3:57 PM 0.3 mL 08/29/2019 Intramuscular   Manufacturer: [place]   Lot: [id number]   [place]: [id number]

## 2019-10-30 DIAGNOSIS — M25521 Pain in right elbow: Secondary | ICD-10-CM | POA: Diagnosis not present

## 2019-11-03 DIAGNOSIS — M25521 Pain in right elbow: Secondary | ICD-10-CM | POA: Diagnosis not present

## 2019-11-11 DIAGNOSIS — S42411A Displaced simple supracondylar fracture without intercondylar fracture of right humerus, initial encounter for closed fracture: Secondary | ICD-10-CM | POA: Diagnosis not present

## 2019-11-11 DIAGNOSIS — M25521 Pain in right elbow: Secondary | ICD-10-CM | POA: Diagnosis not present

## 2019-11-18 DIAGNOSIS — M25521 Pain in right elbow: Secondary | ICD-10-CM | POA: Diagnosis not present

## 2019-11-21 ENCOUNTER — Ambulatory Visit: Payer: Medicare Other | Attending: Internal Medicine

## 2019-11-21 DIAGNOSIS — Z23 Encounter for immunization: Secondary | ICD-10-CM | POA: Insufficient documentation

## 2019-11-21 NOTE — Progress Notes (Signed)
   Covid-19 Vaccination Clinic  Name:  [name]    MRN: [id number] DOB: [birth year]  11/21/2019  Mr. Done was observed post Covid-19 immunization for 15 minutes without incident. He was provided with Vaccine Information Sheet and instruction to access the V-Safe system.   [name] was instructed to call 911 with any severe reactions post vaccine: Marland Kitchen Difficulty breathing  . Swelling of face and throat  . A fast heartbeat  . A bad rash all over body  . Dizziness and weakness   Immunizations Administered    Name Date Dose VIS Date Route   Pfizer COVID-19 Vaccine 11/21/2019 12:16 PM 0.3 mL 08/29/2019 Intramuscular   Manufacturer: [place]   Lot: [id number]   [place]: [id number]

## 2019-11-25 DIAGNOSIS — M25521 Pain in right elbow: Secondary | ICD-10-CM | POA: Diagnosis not present

## 2019-12-02 DIAGNOSIS — M25521 Pain in right elbow: Secondary | ICD-10-CM | POA: Diagnosis not present

## 2019-12-04 ENCOUNTER — Other Ambulatory Visit: Payer: Self-pay

## 2019-12-04 ENCOUNTER — Encounter: Payer: Self-pay | Admitting: Internal Medicine

## 2019-12-04 ENCOUNTER — Ambulatory Visit (INDEPENDENT_AMBULATORY_CARE_PROVIDER_SITE_OTHER): Payer: Medicare Other | Admitting: Internal Medicine

## 2019-12-04 VITALS — BP 136/80 | HR 77 | Ht 70.0 in | Wt 186.0 lb

## 2019-12-04 DIAGNOSIS — I10 Essential (primary) hypertension: Secondary | ICD-10-CM | POA: Diagnosis not present

## 2019-12-04 DIAGNOSIS — R609 Edema, unspecified: Secondary | ICD-10-CM | POA: Diagnosis not present

## 2019-12-04 DIAGNOSIS — R600 Localized edema: Secondary | ICD-10-CM | POA: Diagnosis not present

## 2019-12-04 NOTE — Patient Instructions (Signed)
Medication Instructions:  No changes *If you need a refill on your cardiac medications before your next appointment, please call your pharmacy*   Lab Work: Today: bmet, bnp If you have labs (blood work) drawn today and your tests are completely normal, you will receive your results only by: Marland Kitchen MyChart Message (if you have MyChart) OR . A paper copy in the mail If you have any lab test that is abnormal or we need to change your treatment, we will call you to review the results.   Testing/Procedures: Your physician has requested that you have an echocardiogram. Echocardiography is a painless test that uses sound waves to create images of your heart. It provides your doctor with information about the size and shape of your heart and how well your heart's chambers and valves are working. This procedure takes approximately one hour. There are no restrictions for this procedure.   Follow-Up: At [facility], you and your health needs are our priority.  As part of our continuing mission to provide you with exceptional heart care, we have created designated Provider Care Teams.  These Care Teams include your primary Cardiologist (physician) and Advanced Practice Providers (APPs -  Physician Assistants and Nurse Practitioners) who all work together to provide you with the care you need, when you need it.  Your next appointment:   8 month(s)  The format for your next appointment:   In Person  Provider:   You may see Dorris Carnes, MD or one of the following Advanced Practice Providers on your designated Care Team:    Richardson Dopp, PA-C  [place], [place]  Daune Perch, NP    Other Instructions

## 2019-12-04 NOTE — Progress Notes (Signed)
Cardiology Office Note   Date:  12/04/2019   ID:  [name], DOB 22-Feb-1931, MRN [id number]  PCP:  Shirline Frees, MD  Cardiologist:   Dorris Carnes, MD   F/U of CAD   History of Present Illness: [name] is a 86 y.o. male with a history of CAD (2009:  90 to 95% stenosis prox RCA; 70 to 80% distal LAD 50% D1; 60 to 70% D2; 40% LCx 80% OM1)  He is s/p rotational atherectomy and DES to RCA in 2009, SSS s/p PPM in 2009, hypertension, hyperlipidemia, and solitary kidney. Marland Kitchen  He was also seen by J Alrred in October 2020    The pt is slowly recovering from broken arm    He says his breathing is OK   He denies CP   No palpitatoins.     Current Meds  Medication Sig  . acetaminophen (TYLENOL) 325 MG tablet Take 650 mg by mouth every 6 (six) hours as needed for mild pain.  Marland Kitchen amLODipine (NORVASC) 5 MG tablet Take 1 tablet by mouth once daily  . aspirin 81 MG tablet Take 81 mg by mouth daily.   Marland Kitchen atorvastatin (LIPITOR) 40 MG tablet TAKE 1 TABLET BY MOUTH ONCE DAILY AT  6PM  . beta carotene w/minerals (OCUVITE) tablet Take 1 tablet by mouth daily.  . carvedilol (COREG) 3.125 MG tablet Take 1 tablet (3.125 mg total) by mouth 2 (two) times daily.  . clopidogrel (PLAVIX) 75 MG tablet Take 1 tablet by mouth once daily  . finasteride (PROSCAR) 5 MG tablet Take 5 mg by mouth daily.  . furosemide (LASIX) 40 MG tablet Take 40 mg by mouth 2 (two) times a week.  Marland Kitchen HYDROcodone-acetaminophen (NORCO/VICODIN) 5-325 MG tablet Take 1-2 tablets by mouth every 4 (four) hours as needed for moderate pain (pain score 4-6).  Marland Kitchen metroNIDAZOLE (METROCREAM) 0.75 % cream Apply 1 application topically 2 (two) times daily as needed (skin).   . Multiple Vitamins-Minerals (CENTRUM SILVER PO) Take 1 capsule by mouth daily.   Marland Kitchen NITROSTAT 0.4 MG SL tablet DISSOLVE ONE TABLET UNDER THE TONGUE EVERY 5 MINUTES AS NEEDED FOR CHEST PAIN.  DO NOT EXCEED A TOTAL OF 3 DOSES IN 15 MINUTES  . oxyCODONE-acetaminophen  (PERCOCET/ROXICET) 5-325 MG tablet Take 1 tablet by mouth every 6 (six) hours as needed for severe pain.  . potassium chloride (KLOR-CON) 10 MEQ tablet Take 1 tablet (10 mEq total) by mouth once a week. Take with furosemide  . spironolactone (ALDACTONE) 25 MG tablet Take 1/2 (one-half) tablet by mouth once daily  . temazepam (RESTORIL) 15 MG capsule Take 15 mg by mouth 2 (two) times daily.      Allergies:   Cyclosporine, Maxzide [triamterene-hctz], Sulfa antibiotics, Sulfur, and Prednisone   Past Medical History:  Diagnosis Date  . Carotid artery disease ([place])   . Coronary artery disease    PCI RCA 2009  . Dyslipidemia   . Hypertension   . Scleritis, unspecified    treated with prednisone  . Seizure [facility])    felt due to cyclosporin in setting of electrolyte disorder  . Solitary kidney   . Symptomatic bradycardia    a. s/p STJ dual chamber pacemaker    Past Surgical History:  Procedure Laterality Date  . CARDIAC CATHETERIZATION     12 years ago?  . COLONOSCOPY    . EYE SURGERY     right eye about 10 years ago  . ORIF HUMERUS FRACTURE Right 07/03/2019  Procedure: Right distal humerus open reduction, internal fixation;  Surgeon: Verner Mould, MD;  Location: [facility];  Service: Orthopedics;  Laterality: Right;  . PACEMAKER INSERTION  2009   STJ dual chamber pacemaker implanted by Dr Olevia Perches for symptomatic bradycardia     Social History:  The patient  reports that he has never smoked. He has never used smokeless tobacco. He reports current alcohol use. He reports that he does not use drugs.   Family History:  The patient's family history includes Arthritis/Rheumatoid in his mother.    ROS:  Please see the history of present illness. All other systems are reviewed and  Negative to the above problem except as noted.    PHYSICAL EXAM: VS:  BP 136/80   Pulse 77   Ht 5\' 10"  (1.778 m)   Wt 186 lb (84.4 kg)   SpO2 95%   BMI 26.69 kg/m   GEN: Well nourished, well  developed, in no acute distress  HEENT: normal  Neck:  JVP is normal  Cardiac: RRR; no murmurs, rubs, or gallops, 1+ LE edema  Respiratory:  clear to auscultation bilaterally, normal work of breathing GI: soft, nontender, nondistended, + BS  No hepatomegaly  MS: R arm in brace  Skin: warm and dry, no rash Neuro:  Strength and sensation are intact Psych: euthymic mood, full affect   EKG:  EKG is not ordered today.   Lipid Panel    Component Value Date/Time   CHOL 149 07/18/2019 1252   TRIG 73 07/18/2019 1252   HDL 65 07/18/2019 1252   CHOLHDL 2.3 07/18/2019 1252   CHOLHDL 2 06/29/2009 1230   VLDL 7.4 06/29/2009 1230   LDLCALC 70 07/18/2019 1252      Wt Readings from Last 3 Encounters:  12/04/19 186 lb (84.4 kg)  07/18/19 183 lb (83 kg)  07/03/19 179 lb 14.3 oz (81.6 kg)      ASSESSMENT AND PLAN:  1  CAD  Pt remains symptom free   2  Edema   Last assessment of LVEF/RVEF was 2009  Will set up for echo   Get BMET and BNP   Discuss pushing lasix 3  HTN  BP is OK   4  Hx sick sinus syntdrom patient followed in cardiology clinic by Thompson Grayer.  Patient status post permanent pacemaker placement.  5  HL   Continue lipitor  Last LDL 69    F/U in November   Current medicines are reviewed at length with the patient today.  The patient does not have concerns regarding medicines.  Signed, Dorris Carnes, MD  12/04/2019 3:47 PM    [facility] [place], [place], [place]  [address] Phone: [phone]; Fax: [phone]

## 2019-12-09 DIAGNOSIS — M25521 Pain in right elbow: Secondary | ICD-10-CM | POA: Diagnosis not present

## 2019-12-22 ENCOUNTER — Ambulatory Visit (HOSPITAL_COMMUNITY): Payer: Medicare Other | Attending: Cardiovascular Disease

## 2019-12-22 ENCOUNTER — Other Ambulatory Visit: Payer: Self-pay

## 2019-12-22 DIAGNOSIS — R609 Edema, unspecified: Secondary | ICD-10-CM | POA: Insufficient documentation

## 2019-12-22 DIAGNOSIS — R600 Localized edema: Secondary | ICD-10-CM | POA: Insufficient documentation

## 2019-12-22 DIAGNOSIS — I10 Essential (primary) hypertension: Secondary | ICD-10-CM | POA: Diagnosis not present

## 2019-12-23 DIAGNOSIS — M25521 Pain in right elbow: Secondary | ICD-10-CM | POA: Diagnosis not present

## 2019-12-29 ENCOUNTER — Telehealth: Payer: Self-pay | Admitting: Internal Medicine

## 2019-12-29 NOTE — Telephone Encounter (Signed)
Returned call to pt.  We discussed his echo results.. All, if any, questions were all answered

## 2019-12-29 NOTE — Telephone Encounter (Signed)
New Message  Pt is returning call for Echo results   Please call back  

## 2019-12-30 DIAGNOSIS — S42411A Displaced simple supracondylar fracture without intercondylar fracture of right humerus, initial encounter for closed fracture: Secondary | ICD-10-CM | POA: Diagnosis not present

## 2020-01-23 ENCOUNTER — Other Ambulatory Visit: Payer: Self-pay | Admitting: Internal Medicine

## 2020-01-23 MED ORDER — POTASSIUM CHLORIDE ER 10 MEQ PO TBCR: 10.0000 meq | EXTENDED_RELEASE_TABLET | ORAL | 11 refills | Status: DC

## 2020-01-23 MED ORDER — FUROSEMIDE 40 MG PO TABS: 40.0000 mg | ORAL_TABLET | ORAL | 11 refills | Status: DC

## 2020-01-28 DIAGNOSIS — M79672 Pain in left foot: Secondary | ICD-10-CM | POA: Diagnosis not present

## 2020-01-30 ENCOUNTER — Other Ambulatory Visit: Payer: Self-pay | Admitting: Internal Medicine

## 2020-02-04 ENCOUNTER — Other Ambulatory Visit: Payer: Self-pay | Admitting: Internal Medicine

## 2020-02-17 ENCOUNTER — Other Ambulatory Visit: Payer: Self-pay | Admitting: Internal Medicine

## 2020-02-18 ENCOUNTER — Other Ambulatory Visit (HOSPITAL_COMMUNITY): Payer: Self-pay | Admitting: Internal Medicine

## 2020-02-18 ENCOUNTER — Other Ambulatory Visit: Payer: Self-pay

## 2020-02-18 ENCOUNTER — Ambulatory Visit (HOSPITAL_COMMUNITY)
Admission: RE | Admit: 2020-02-18 | Discharge: 2020-02-18 | Disposition: A | Discharge status: Home / Self Care | Payer: Medicare Other | Source: Ambulatory Visit | Attending: Cardiology | Admitting: Cardiology

## 2020-02-18 DIAGNOSIS — I6523 Occlusion and stenosis of bilateral carotid arteries: Secondary | ICD-10-CM

## 2020-03-09 ENCOUNTER — Other Ambulatory Visit: Payer: Self-pay | Admitting: Internal Medicine

## 2020-03-12 DIAGNOSIS — E7800 Pure hypercholesterolemia, unspecified: Secondary | ICD-10-CM | POA: Diagnosis not present

## 2020-03-12 DIAGNOSIS — I10 Essential (primary) hypertension: Secondary | ICD-10-CM | POA: Diagnosis not present

## 2020-03-12 DIAGNOSIS — N400 Enlarged prostate without lower urinary tract symptoms: Secondary | ICD-10-CM | POA: Diagnosis not present

## 2020-03-30 DIAGNOSIS — R05 Cough: Secondary | ICD-10-CM | POA: Diagnosis not present

## 2020-03-30 DIAGNOSIS — I10 Essential (primary) hypertension: Secondary | ICD-10-CM | POA: Diagnosis not present

## 2020-03-30 DIAGNOSIS — E7800 Pure hypercholesterolemia, unspecified: Secondary | ICD-10-CM | POA: Diagnosis not present

## 2020-04-09 ENCOUNTER — Other Ambulatory Visit: Payer: Self-pay | Admitting: Family Medicine

## 2020-04-09 ENCOUNTER — Ambulatory Visit
Admission: RE | Admit: 2020-04-09 | Discharge: 2020-04-09 | Disposition: A | Discharge status: Home / Self Care | Payer: Medicare Other | Source: Ambulatory Visit | Attending: Family Medicine | Admitting: Family Medicine

## 2020-04-09 DIAGNOSIS — R05 Cough: Secondary | ICD-10-CM

## 2020-04-09 DIAGNOSIS — R059 Cough, unspecified: Secondary | ICD-10-CM

## 2020-05-07 DIAGNOSIS — R1310 Dysphagia, unspecified: Secondary | ICD-10-CM | POA: Diagnosis not present

## 2020-05-07 DIAGNOSIS — R479 Unspecified speech disturbances: Secondary | ICD-10-CM | POA: Diagnosis not present

## 2020-05-12 ENCOUNTER — Inpatient Hospital Stay (HOSPITAL_COMMUNITY): Payer: Medicare Other

## 2020-05-12 ENCOUNTER — Institutional Professional Consult (permissible substitution): Payer: Medicare Other | Admitting: Pulmonary Disease

## 2020-05-12 ENCOUNTER — Other Ambulatory Visit: Payer: Self-pay

## 2020-05-12 ENCOUNTER — Emergency Department (HOSPITAL_COMMUNITY): Payer: Medicare Other

## 2020-05-12 ENCOUNTER — Encounter (HOSPITAL_COMMUNITY): Payer: Self-pay | Admitting: Pharmacy Technician

## 2020-05-12 ENCOUNTER — Inpatient Hospital Stay (HOSPITAL_COMMUNITY)
Admission: EM | Admit: 2020-05-12 | Discharge: 2020-05-15 | DRG: 392 | Disposition: A | Discharge status: Home / Self Care | Payer: Medicare Other | Attending: Internal Medicine | Admitting: Internal Medicine

## 2020-05-12 DIAGNOSIS — D7589 Other specified diseases of blood and blood-forming organs: Secondary | ICD-10-CM | POA: Diagnosis present

## 2020-05-12 DIAGNOSIS — N182 Chronic kidney disease, stage 2 (mild): Secondary | ICD-10-CM | POA: Diagnosis present

## 2020-05-12 DIAGNOSIS — Z79891 Long term (current) use of opiate analgesic: Secondary | ICD-10-CM

## 2020-05-12 DIAGNOSIS — I129 Hypertensive chronic kidney disease with stage 1 through stage 4 chronic kidney disease, or unspecified chronic kidney disease: Secondary | ICD-10-CM | POA: Diagnosis present

## 2020-05-12 DIAGNOSIS — Z7902 Long term (current) use of antithrombotics/antiplatelets: Secondary | ICD-10-CM

## 2020-05-12 DIAGNOSIS — I6529 Occlusion and stenosis of unspecified carotid artery: Secondary | ICD-10-CM | POA: Diagnosis present

## 2020-05-12 DIAGNOSIS — N400 Enlarged prostate without lower urinary tract symptoms: Secondary | ICD-10-CM | POA: Diagnosis present

## 2020-05-12 DIAGNOSIS — R531 Weakness: Secondary | ICD-10-CM | POA: Diagnosis not present

## 2020-05-12 DIAGNOSIS — R1313 Dysphagia, pharyngeal phase: Secondary | ICD-10-CM | POA: Diagnosis not present

## 2020-05-12 DIAGNOSIS — R1310 Dysphagia, unspecified: Secondary | ICD-10-CM | POA: Diagnosis not present

## 2020-05-12 DIAGNOSIS — Z950 Presence of cardiac pacemaker: Secondary | ICD-10-CM

## 2020-05-12 DIAGNOSIS — Z20822 Contact with and (suspected) exposure to covid-19: Secondary | ICD-10-CM | POA: Diagnosis present

## 2020-05-12 DIAGNOSIS — Z8261 Family history of arthritis: Secondary | ICD-10-CM

## 2020-05-12 DIAGNOSIS — I10 Essential (primary) hypertension: Secondary | ICD-10-CM | POA: Diagnosis not present

## 2020-05-12 DIAGNOSIS — D72829 Elevated white blood cell count, unspecified: Secondary | ICD-10-CM | POA: Diagnosis present

## 2020-05-12 DIAGNOSIS — E785 Hyperlipidemia, unspecified: Secondary | ICD-10-CM | POA: Diagnosis present

## 2020-05-12 DIAGNOSIS — Z882 Allergy status to sulfonamides status: Secondary | ICD-10-CM | POA: Diagnosis not present

## 2020-05-12 DIAGNOSIS — M542 Cervicalgia: Secondary | ICD-10-CM | POA: Diagnosis not present

## 2020-05-12 DIAGNOSIS — Q600 Renal agenesis, unilateral: Secondary | ICD-10-CM | POA: Diagnosis not present

## 2020-05-12 DIAGNOSIS — G7000 Myasthenia gravis without (acute) exacerbation: Secondary | ICD-10-CM | POA: Diagnosis not present

## 2020-05-12 DIAGNOSIS — I495 Sick sinus syndrome: Secondary | ICD-10-CM | POA: Diagnosis present

## 2020-05-12 DIAGNOSIS — S51012A Laceration without foreign body of left elbow, initial encounter: Secondary | ICD-10-CM | POA: Diagnosis present

## 2020-05-12 DIAGNOSIS — Z955 Presence of coronary angioplasty implant and graft: Secondary | ICD-10-CM | POA: Diagnosis not present

## 2020-05-12 DIAGNOSIS — R490 Dysphonia: Secondary | ICD-10-CM | POA: Diagnosis present

## 2020-05-12 DIAGNOSIS — W19XXXA Unspecified fall, initial encounter: Secondary | ICD-10-CM | POA: Diagnosis not present

## 2020-05-12 DIAGNOSIS — W1839XA Other fall on same level, initial encounter: Secondary | ICD-10-CM | POA: Diagnosis present

## 2020-05-12 DIAGNOSIS — I2510 Atherosclerotic heart disease of native coronary artery without angina pectoris: Secondary | ICD-10-CM | POA: Diagnosis present

## 2020-05-12 DIAGNOSIS — Z7982 Long term (current) use of aspirin: Secondary | ICD-10-CM | POA: Diagnosis not present

## 2020-05-12 DIAGNOSIS — Y9389 Activity, other specified: Secondary | ICD-10-CM | POA: Diagnosis not present

## 2020-05-12 DIAGNOSIS — S0990XA Unspecified injury of head, initial encounter: Secondary | ICD-10-CM | POA: Diagnosis not present

## 2020-05-12 DIAGNOSIS — Z79899 Other long term (current) drug therapy: Secondary | ICD-10-CM | POA: Diagnosis not present

## 2020-05-12 DIAGNOSIS — R2990 Unspecified symptoms and signs involving the nervous system: Secondary | ICD-10-CM | POA: Diagnosis not present

## 2020-05-12 DIAGNOSIS — R471 Dysarthria and anarthria: Secondary | ICD-10-CM | POA: Diagnosis present

## 2020-05-12 DIAGNOSIS — Y92039 Unspecified place in apartment as the place of occurrence of the external cause: Secondary | ICD-10-CM

## 2020-05-12 DIAGNOSIS — E782 Mixed hyperlipidemia: Secondary | ICD-10-CM | POA: Diagnosis present

## 2020-05-12 DIAGNOSIS — R29810 Facial weakness: Secondary | ICD-10-CM | POA: Diagnosis not present

## 2020-05-12 DIAGNOSIS — Z888 Allergy status to other drugs, medicaments and biological substances status: Secondary | ICD-10-CM

## 2020-05-12 DIAGNOSIS — S0993XA Unspecified injury of face, initial encounter: Secondary | ICD-10-CM | POA: Diagnosis not present

## 2020-05-12 DIAGNOSIS — R0902 Hypoxemia: Secondary | ICD-10-CM | POA: Diagnosis not present

## 2020-05-12 DIAGNOSIS — R05 Cough: Secondary | ICD-10-CM | POA: Diagnosis not present

## 2020-05-12 LAB — I-STAT CHEM 8, ED
BUN: 21 mg/dL (ref 8–23)
Calcium, Ion: 0.93 mmol/L — ABNORMAL LOW (ref 1.15–1.40)
Chloride: 101 mmol/L (ref 98–111)
Creatinine, Ser: 1 mg/dL (ref 0.61–1.24)
Glucose, Bld: 101 mg/dL — ABNORMAL HIGH (ref 70–99)
HCT: 41 % (ref 39.0–52.0)
Hemoglobin: 13.9 g/dL (ref 13.0–17.0)
Potassium: 3.7 mmol/L (ref 3.5–5.1)
Sodium: 136 mmol/L (ref 135–145)
TCO2: 24 mmol/L (ref 22–32)

## 2020-05-12 LAB — CBC
HCT: 43.7 % (ref 39.0–52.0)
HCT: 43.8 % (ref 39.0–52.0)
Hemoglobin: 14.2 g/dL (ref 13.0–17.0)
Hemoglobin: 14.5 g/dL (ref 13.0–17.0)
MCH: 32.9 pg (ref 26.0–34.0)
MCH: 32.6 pg (ref 26.0–34.0)
MCHC: 32.5 g/dL (ref 30.0–36.0)
MCHC: 33.1 g/dL (ref 30.0–36.0)
MCV: 101.4 fL — ABNORMAL HIGH (ref 80.0–100.0)
MCV: 98.4 fL (ref 80.0–100.0)
Platelets: 176 10*3/uL (ref 150–400)
Platelets: 188 10*3/uL (ref 150–400)
RBC: 4.31 MIL/uL (ref 4.22–5.81)
RBC: 4.45 MIL/uL (ref 4.22–5.81)
RDW: 14.2 % (ref 11.5–15.5)
RDW: 14.1 % (ref 11.5–15.5)
WBC: 14.2 10*3/uL — ABNORMAL HIGH (ref 4.0–10.5)
WBC: 11.2 10*3/uL — ABNORMAL HIGH (ref 4.0–10.5)
nRBC: 0 % (ref 0.0–0.2)
nRBC: 0 % (ref 0.0–0.2)

## 2020-05-12 LAB — COMPREHENSIVE METABOLIC PANEL
ALT: 25 U/L (ref 0–44)
AST: 28 U/L (ref 15–41)
Albumin: 3.5 g/dL (ref 3.5–5.0)
Alkaline Phosphatase: 89 U/L (ref 38–126)
Anion gap: 11 (ref 5–15)
BUN: 19 mg/dL (ref 8–23)
CO2: 26 mmol/L (ref 22–32)
Calcium: 9.5 mg/dL (ref 8.9–10.3)
Chloride: 100 mmol/L (ref 98–111)
Creatinine, Ser: 1.12 mg/dL (ref 0.61–1.24)
GFR calc Af Amer: >60 mL/min (ref >60)
GFR calc non Af Amer: 59 mL/min — ABNORMAL LOW (ref >60)
Glucose, Bld: 104 mg/dL — ABNORMAL HIGH (ref 70–99)
Potassium: 3.7 mmol/L (ref 3.5–5.1)
Sodium: 137 mmol/L (ref 135–145)
Total Bilirubin: 0.9 mg/dL (ref 0.3–1.2)
Total Protein: 7.2 g/dL (ref 6.5–8.1)

## 2020-05-12 LAB — URINALYSIS, ROUTINE W REFLEX MICROSCOPIC
Bilirubin Urine: NEGATIVE
Glucose, UA: NEGATIVE mg/dL
Hgb urine dipstick: NEGATIVE
Ketones, ur: 20 mg/dL — AB
Leukocytes,Ua: NEGATIVE
Nitrite: NEGATIVE
Protein, ur: NEGATIVE mg/dL
Specific Gravity, Urine: 1.016 (ref 1.005–1.030)
pH: 6 (ref 5.0–8.0)

## 2020-05-12 LAB — DIFFERENTIAL
Abs Immature Granulocytes: 0.08 10*3/uL — ABNORMAL HIGH (ref 0.00–0.07)
Basophils Absolute: 0.1 10*3/uL (ref 0.0–0.1)
Basophils Relative: 0 %
Eosinophils Absolute: 0.2 10*3/uL (ref 0.0–0.5)
Eosinophils Relative: 1 %
Immature Granulocytes: 1 %
Lymphocytes Relative: 5 %
Lymphs Abs: 0.8 10*3/uL (ref 0.7–4.0)
Monocytes Absolute: 1.2 10*3/uL — ABNORMAL HIGH (ref 0.1–1.0)
Monocytes Relative: 9 %
Neutro Abs: 11.8 10*3/uL — ABNORMAL HIGH (ref 1.7–7.7)
Neutrophils Relative %: 84 %

## 2020-05-12 LAB — RAPID URINE DRUG SCREEN, HOSP PERFORMED
Amphetamines: NOT DETECTED
Barbiturates: NOT DETECTED
Benzodiazepines: POSITIVE — AB
Cocaine: NOT DETECTED
Opiates: NOT DETECTED
Tetrahydrocannabinol: NOT DETECTED

## 2020-05-12 LAB — VITAMIN B12: Vitamin B-12: 956 pg/mL — ABNORMAL HIGH (ref 180–914)

## 2020-05-12 LAB — PROTIME-INR
INR: 1.2 (ref 0.8–1.2)
Prothrombin Time: 14.3 seconds (ref 11.4–15.2)

## 2020-05-12 LAB — ETHANOL: Alcohol, Ethyl (B): <10 mg/dL (ref <10)

## 2020-05-12 LAB — CK: Total CK: 87 U/L (ref 49–397)

## 2020-05-12 LAB — CREATININE, SERUM
Creatinine, Ser: 0.92 mg/dL (ref 0.61–1.24)
GFR calc Af Amer: >60 mL/min (ref >60)
GFR calc non Af Amer: >60 mL/min (ref >60)

## 2020-05-12 LAB — APTT: aPTT: 37 seconds — ABNORMAL HIGH (ref 24–36)

## 2020-05-12 LAB — SARS CORONAVIRUS 2 BY RT PCR (HOSPITAL ORDER, PERFORMED IN ~~LOC~~ HOSPITAL LAB): SARS Coronavirus 2: NEGATIVE

## 2020-05-12 LAB — TSH: TSH: 1.734 u[IU]/mL (ref 0.350–4.500)

## 2020-05-12 MED ORDER — STROKE: EARLY STAGES OF RECOVERY BOOK
Freq: Once | Status: AC
  Filled 2020-05-12: qty 1

## 2020-05-12 MED ORDER — ASPIRIN 300 MG RE SUPP
300.0000 mg | Freq: Once | RECTAL | Status: AC
  Administered 2020-05-12: 300 mg via RECTAL
  Filled 2020-05-12: qty 1

## 2020-05-12 MED ORDER — ACETAMINOPHEN 650 MG RE SUPP: 650.0000 mg | RECTAL | Status: DC | PRN

## 2020-05-12 MED ORDER — ACETAMINOPHEN 160 MG/5ML PO SOLN: 650.0000 mg | ORAL | Status: DC | PRN

## 2020-05-12 MED ORDER — ACETAMINOPHEN 325 MG PO TABS: 650.0000 mg | ORAL_TABLET | ORAL | Status: DC | PRN

## 2020-05-12 MED ORDER — ENOXAPARIN SODIUM 40 MG/0.4ML ~~LOC~~ SOLN
40.0000 mg | SUBCUTANEOUS | Status: DC
  Administered 2020-05-12 – 2020-05-14 (×3): 40 mg via SUBCUTANEOUS
  Filled 2020-05-12 (×2): qty 0.4

## 2020-05-12 NOTE — ED Notes (Signed)
Family at the bedside.

## 2020-05-12 NOTE — Progress Notes (Signed)
Patient arrived in the unit at 2150 pm from [facility], A&O, no s/s distress, pt is in telemetry monitor, resting in a bed at this moment, and will continue to monitor.

## 2020-05-12 NOTE — ED Provider Notes (Signed)
This patient presents with multiple complaints, including difficulty swallowing, difficulty ambulating, difficulty with balance, difficulty holding his head up.  On exam the patient has a very dry mouth with dry tongue, has clear heart and lungs with a soft abdomen.  He is able to move all 4 extremities with normal strength and able to perform finger-nose-finger.  There is concern for stroke, he will need further work-up   EKG Interpretation  Date/Time:  Wednesday May 12 2020 14:16:26 EDT Ventricular Rate:  77 PR Interval:  184 QRS Duration: 88 QT Interval:  396 QTC Calculation: 448 R Axis:   -14 Text Interpretation: Normal sinus rhythm Normal ECG Left axis deviation since last tracing no significant change Confirmed by Noemi Chapel [phone]) on 05/12/2020 2:24:49 PM       MRI pending  Medical screening examination/treatment/procedure(s) were conducted as a shared visit with non-physician practitioner(s) and myself.  I personally evaluated the patient during the encounter.  Clinical Impression:   Final diagnoses:  Dysphagia, unspecified type  Weakness         Noemi Chapel, MD 05/14/20 1609

## 2020-05-12 NOTE — ED Notes (Signed)
Pt with no urinary output at this time. NT to bladder scan pt.

## 2020-05-12 NOTE — ED Notes (Signed)
Patient transported to X-ray 

## 2020-05-12 NOTE — ED Triage Notes (Signed)
Pt bib ems from home after falling from standing position. Pt was standing to use urinal and lost his balance. Pt on plavix for stent placement. Pt denies hitting head, denies LOC. Pt with skin tear to L elbow, bleeding controlled. Pt also with coordination issues, dysphagia, slurred speech and slight L facial droop since Thursday.  172/84 HR 80's NSR RR 18 95% RA CBG 114

## 2020-05-12 NOTE — ED Notes (Signed)
Shirlean Mylar, daughter- [phone] would like an update

## 2020-05-12 NOTE — H&P (Addendum)
History and Physical    [name] ZOX:[id number] DOB: 01-Apr-1931 DOA: 05/12/2020  PCP: Shirline Frees, MD  Patient coming from: home   I have personally briefly reviewed patient's old medical records in [place]  Chief Complaint: Strokelike symptoms and fall  HPI: [name] is a 86 y.o. male with medical history significant of hypertension, BPH, hyperlipidemia, coronary artery disease status post stent placement, symptomatic bradycardia status post pacemaker placement presents to emergency department with dysphagia, slurred speech, generalized weakness since 1 week and fall this morning.  Patient reports that he has progressive dysphagia with slurred speech, generalized weakness, lethargy, balance issues since 1 week, watery stools since 2 days and has cough since 1 month.  He was trying to stand to use urinal and lost his balance and fell however he denies loss of consciousness, head trauma, lightheadedness, dizziness prior or after the fall.  He tells me that he got a skin tear to left elbow, reports midsternal chest pain due to muscle pull after fall, denies shortness of breath, wheezing, leg swelling, orthopnea, PND, fever, chills, nausea, vomiting, abdominal pain, appetite changes.  He is fully vaccinated against COVID-19.  He lives alone at home.  No history of smoking, alcohol, illicit drug use.  ED Course: Upon arrival to ED: Patient's blood pressure was noted to be elevated 166/99, afebrile with leukocytosis of 14.2, MCV: 101.4, CMP shows stable CKD, ethanol: WNL, UA, UC, UDS, CK, COVID-19: Pending.  CT head and maxillofacial spine: Came back negative for acute findings.  Could not get MRI as patient has pacemaker which is not compatible for MRI.  Neurology consulted who recommended to hold statin and check CK level and call neurology tomorrow if needed. [facility] hospitalist consulted for admission for strokelike symptoms.  Review of Systems: As per HPI otherwise negative.     Past Medical History:  Diagnosis Date  . Carotid artery disease ([place])   . Coronary artery disease    PCI RCA 2009  . Dyslipidemia   . Hypertension   . Scleritis, unspecified    treated with prednisone  . Seizure [facility])    felt due to cyclosporin in setting of electrolyte disorder  . Solitary kidney   . Symptomatic bradycardia    a. s/p STJ dual chamber pacemaker    Past Surgical History:  Procedure Laterality Date  . CARDIAC CATHETERIZATION     12 years ago?  . COLONOSCOPY    . EYE SURGERY     right eye about 10 years ago  . ORIF HUMERUS FRACTURE Right 07/03/2019   Procedure: Right distal humerus open reduction, internal fixation;  Surgeon: Verner Mould, MD;  Location: [facility];  Service: Orthopedics;  Laterality: Right;  . PACEMAKER INSERTION  2009   STJ dual chamber pacemaker implanted by Dr Olevia Perches for symptomatic bradycardia     reports that he has never smoked. He has never used smokeless tobacco. He reports current alcohol use. He reports that he does not use drugs.  Allergies  Allergen Reactions  . Cyclosporine     unknown  . Maxzide [Triamterene-Hctz] Other (See Comments)    Adverse reaction-bloating and abdominal pain  . Sulfa Antibiotics   . Sulfur Other (See Comments)    No energy, could not walk   . Prednisone Other (See Comments)    Can tolerate for a short period of time Unknown reaction    Family History  Problem Relation Age of Onset  . Arthritis/Rheumatoid Mother  Prior to Admission medications   Medication Sig Start Date End Date Taking? Authorizing Provider  acetaminophen (TYLENOL) 325 MG tablet Take 650 mg by mouth every 6 (six) hours as needed for mild pain.    [provider]  amLODipine (NORVASC) 5 MG tablet Take 1 tablet by mouth once daily 09/01/19   Fay Records, MD  aspirin 81 MG tablet Take 81 mg by mouth daily.  07/19/11   Allred, Jeneen Rinks, MD  atorvastatin (LIPITOR) 40 MG tablet TAKE 1 TABLET BY MOUTH ONCE DAILY  AT  [facility] 03/09/20   Fay Records, MD  beta carotene w/minerals (OCUVITE) tablet Take 1 tablet by mouth daily.    [provider]  carvedilol (COREG) 3.125 MG tablet Take 1 tablet by mouth twice daily 02/18/20   Fay Records, MD  clopidogrel (PLAVIX) 75 MG tablet Take 1 tablet by mouth once daily 07/22/19   Fay Records, MD  finasteride (PROSCAR) 5 MG tablet Take 5 mg by mouth daily.    [provider]  furosemide (LASIX) 40 MG tablet Take 1 tablet (40 mg total) by mouth 2 (two) times a week. 01/26/20   Fay Records, MD  HYDROcodone-acetaminophen (NORCO/VICODIN) 5-325 MG tablet Take 1-2 tablets by mouth every 4 (four) hours as needed for moderate pain (pain score 4-6). 07/04/19   Avanell Shackleton III, MD  metroNIDAZOLE (METROCREAM) 0.75 % cream Apply 1 application topically 2 (two) times daily as needed (skin).     [provider]  Multiple Vitamins-Minerals (CENTRUM SILVER PO) Take 1 capsule by mouth daily.     [provider]  NITROSTAT 0.4 MG SL tablet DISSOLVE ONE TABLET UNDER THE TONGUE EVERY 5 MINUTES AS NEEDED FOR CHEST PAIN.  DO NOT EXCEED A TOTAL OF 3 DOSES IN 15 MINUTES 11/19/18   Fay Records, MD  oxyCODONE-acetaminophen (PERCOCET/ROXICET) 5-325 MG tablet Take 1 tablet by mouth every 6 (six) hours as needed for severe pain. 06/17/19   Petrucelli, Samantha R, PA-C  potassium chloride (KLOR-CON) 10 MEQ tablet Take 1 tablet (10 mEq total) by mouth 2 (two) times a week. Take with furosemide 01/26/20   Fay Records, MD  spironolactone (ALDACTONE) 25 MG tablet Take 1/2 (one-half) tablet by mouth once daily 10/14/19   Fay Records, MD  temazepam (RESTORIL) 15 MG capsule Take 15 mg by mouth 2 (two) times daily.     [provider]    Physical Exam: Vitals:   05/12/20 1236  BP: (!) 166/90  Pulse: 78  Resp: 16  Temp: 97.7 F (36.5 C)  TempSrc: Axillary  SpO2: 97%    Constitutional: NAD, calm, comfortable, on room air, communicating well, has mild  sph. Eyes: PERRL, lids and conjunctivae normal ENMT: Mucous membranes are moist. Posterior pharynx clear of any exudate or lesions.Normal dentition.  Neck: normal, supple, no masses, no thyromegaly Respiratory: clear to auscultation bilaterally, no wheezing, no crackles. Normal respiratory effort. No accessory muscle use.  Cardiovascular: Regular rate and rhythm, no murmurs / rubs / gallops. No extremity edema. 2+ pedal pulses. No carotid bruits.  Abdomen: no tenderness, no masses palpated. No hepatosplenomegaly. Bowel sounds positive.  Musculoskeletal: no clubbing / cyanosis. No joint deformity upper and lower extremities. Good ROM, no contractures. Normal muscle tone.  Skin: Multiple bruises noted on left arm  neurologic: CN 2-12 grossly intact. Sensation intact, DTR normal. Strength 5/5 in all 4.  Psychiatric: Normal judgment and insight. Alert and oriented x 3. Normal mood.  Labs on Admission: I have personally reviewed following labs and imaging studies  CBC: Recent Labs  Lab 05/12/20 1246 05/12/20 1311  WBC 14.2*  --   NEUTROABS 11.8*  --   HGB 14.2 13.9  HCT 43.7 41.0  MCV 101.4*  --   PLT 176  --    Basic Metabolic Panel: Recent Labs  Lab 05/12/20 1246 05/12/20 1311  NA 137 136  K 3.7 3.7  CL 100 101  CO2 26  --   GLUCOSE 104* 101*  BUN 19 21  CREATININE 1.12 1.00  CALCIUM 9.5  --    GFR: CrCl cannot be calculated (Unknown ideal weight.). Liver Function Tests: Recent Labs  Lab 05/12/20 1246  AST 28  ALT 25  ALKPHOS 89  BILITOT 0.9  PROT 7.2  ALBUMIN 3.5   No results for input(s): LIPASE, AMYLASE in the last 168 hours. No results for input(s): AMMONIA in the last 168 hours. Coagulation Profile: Recent Labs  Lab 05/12/20 1246  INR 1.2   Cardiac Enzymes: No results for input(s): CKTOTAL, CKMB, CKMBINDEX, TROPONINI in the last 168 hours. BNP (last 3 results) Recent Labs    07/18/19 1252  PROBNP 156   HbA1C: No results for input(s): HGBA1C  in the last 72 hours. CBG: No results for input(s): GLUCAP in the last 168 hours. Lipid Profile: No results for input(s): CHOL, HDL, LDLCALC, TRIG, CHOLHDL, LDLDIRECT in the last 72 hours. Thyroid Function Tests: No results for input(s): TSH, T4TOTAL, FREET4, T3FREE, THYROIDAB in the last 72 hours. Anemia Panel: No results for input(s): VITAMINB12, FOLATE, FERRITIN, TIBC, IRON, RETICCTPCT in the last 72 hours. Urine analysis:    Component Value Date/Time   COLORURINE LT. YELLOW 08/14/2011 [place] 08/14/2011 1058   LABSPEC 1.015 08/14/2011 1058   PHURINE 6.0 08/14/2011 1058   GLUCOSEU NEGATIVE 08/14/2011 1058   HGBUR TRACE-INTACT 08/14/2011 [place] 08/14/2011 [place] 08/14/2011 [place] 07/09/2008 2015   UROBILINOGEN 0.2 08/14/2011 1058   NITRITE NEGATIVE 08/14/2011 [place] 08/14/2011 1058    Radiological Exams on Admission: CT Head Wo Contrast  Result Date: 05/12/2020 CLINICAL DATA:  Fall EXAM: CT HEAD WITHOUT CONTRAST CT MAXILLOFACIAL WITHOUT CONTRAST TECHNIQUE: Multidetector CT imaging of the head and maxillofacial structures were performed using the standard protocol without intravenous contrast. Multiplanar CT image reconstructions of the maxillofacial structures were also generated. COMPARISON:  None. FINDINGS: CT HEAD FINDINGS Brain: There is no acute intracranial hemorrhage, mass effect, or edema. Gray-white differentiation is preserved. Prominence of the ventricles and sulci reflects mild generalized parenchymal volume loss. Patchy hypoattenuation in the supratentorial white matter is nonspecific but may reflect mild chronic microvascular ischemic changes. Vascular: Intracranial atherosclerotic calcification at the skull base. Skull: Intact. Other: Mastoid air cells are clear. CT MAXILLOFACIAL FINDINGS Osseous: No acute facial fracture. Degenerative changes of the left temporomandibular  joint. Degenerative changes of the visualized cervical spine. Orbits: No intraorbital hematoma. Sinuses: Mild mucosal thickening. Soft tissues: Unremarkable. IMPRESSION: No evidence of acute intracranial injury. No acute facial fracture. Chronic/nonemergent findings detailed above. Electronically Signed   By: Macy Mis M.D.   On: 05/12/2020 15:57   CT Maxillofacial Wo Contrast  Result Date: 05/12/2020 CLINICAL DATA:  Fall EXAM: CT HEAD WITHOUT CONTRAST CT MAXILLOFACIAL WITHOUT CONTRAST TECHNIQUE: Multidetector CT imaging of the head and maxillofacial structures were performed using the standard protocol without intravenous contrast. Multiplanar CT image reconstructions of the maxillofacial structures  were also generated. COMPARISON:  None. FINDINGS: CT HEAD FINDINGS Brain: There is no acute intracranial hemorrhage, mass effect, or edema. Gray-white differentiation is preserved. Prominence of the ventricles and sulci reflects mild generalized parenchymal volume loss. Patchy hypoattenuation in the supratentorial white matter is nonspecific but may reflect mild chronic microvascular ischemic changes. Vascular: Intracranial atherosclerotic calcification at the skull base. Skull: Intact. Other: Mastoid air cells are clear. CT MAXILLOFACIAL FINDINGS Osseous: No acute facial fracture. Degenerative changes of the left temporomandibular joint. Degenerative changes of the visualized cervical spine. Orbits: No intraorbital hematoma. Sinuses: Mild mucosal thickening. Soft tissues: Unremarkable. IMPRESSION: No evidence of acute intracranial injury. No acute facial fracture. Chronic/nonemergent findings detailed above. Electronically Signed   By: Macy Mis M.D.   On: 05/12/2020 15:57    EKG: Independently reviewed.  Normal sinus rhythm, left axis deviation.  No ST elevation or depression noted.  Assessment/Plan Principal Problem:   Stroke-like symptom Active Problems:   Hyperlipidemia LDL goal <70    HYPERTENSION, BENIGN   SSS (sick sinus syndrome) (HCC)   Pacemaker-[employer]   Leukocytosis   Macrocytosis   Stroke-like symptoms    Strokelike symptoms: -Patient presented with dysphagia, slurred speech, gait issues since 1 week.  CT head and maxillofacial: Negative for acute findings. -Patient failed bedside swallow evaluation. -Patient has pacemaker which is not compatible with MRI.  Neurology consulted who recommended to check CK and hold statin and reconsult neurology tomorrow a.m. if needed? -Ethanol: Negative, UA, UC, UDS, CK, COVID-19: Pending -Allow permissive hypertension -Admit patient admit telemetry bed. -We will keep him n.p.o. -Consult PT/OT/SLP -Check lipid panel, A1c -Aspirin per rectal. -Neurochecks -Monitor vitals closely. -We will get chest x-ray  Dysphagia: -Consider barium swallow for further evaluation? -Consult SLP  Hypertension: Elevated upon arrival -We will hold Coreg, amlodipine, spironolactone to allow permissive hypertension -Monitor blood pressure closely.  Hyperlipidemia: Hold statin  Symptomatic bradycardia/sick sinus syndrome: Status post pacemaker placement: Patient denies ACS symptoms.  On telemetry.  Coronary artery disease status post stent placement: Stable -We will hold p.o. home meds nitro as needed, aspirin, statin, Plavix, Coreg as patient failed swallow evaluation.  Leukocytosis: WBC 14.2.  He is afebrile.  Will get chest x-ray.  UA and COVID-19: Pending. -Could be reactive.  Will hold off antibiotics at this time.  Macrocytosis: MCV: 101.4, H&H is stable.  Ethanol: WNL -Check TSH, B12, folate.  CKD stage II: At baseline -Continue to monitor  BPH: Hold Proscar  Carotid artery stenosis: Reviewed carotid Doppler ultrasound from 03/19/2020. -Continue aspirin, statin, Plavix.  Patient failed bedside swallow evaluation therefore we will continue to hold his home p.o. meds until he is evaluated by SLP.  I talked to neurology Dr.  Rory Percy- he recommended against full stroke work up for now. His basic labs are pending including UA, UDS, COVID-19 & CK.  He said call neurology tomorrow am if his basic labs comes back within normal limit and if patient continues to have strokelike symptoms.  DVT prophylaxis: Lovenox/SCD Code Status: Full code-confirmed with the patient Family Communication: Patient's daughter present at bedside.  Plan of care discussed with patient in length and he verbalized understanding and agreed with it. Disposition Plan: Likely home in 1 to 2 days Consults called: Neurology by EDP Admission status: Inpatient  Mckinley Jewel MD [facility]  If 7PM-7AM, please contact night-coverage [email or web address] Password [email or web address]  05/12/2020, 5:59 PM

## 2020-05-12 NOTE — ED Provider Notes (Signed)
[facility] EMERGENCY DEPARTMENT Provider Note   CSN: [id number] Arrival date & time: 05/12/20  1227     History Chief Complaint  Patient presents with  . Fall  . Stroke Symptoms    Roberto NABOZNY is a 86 y.o. male presenting for evaluation of swallowing difficulties, slurred speech, weakness, and fall.  Patient states he fell today when going to the bathroom.  He did not slip or trip.  He did not pass out, have chest pain, shortness of breath prior to falling.  He states he hit the left side of his head, but did not lose consciousness.  He is on Plavix and aspirin, no other blood thinners.  He denies pain or injury elsewhere from the fall other than a skin tear of the left elbow.  He has ambulated since. Distally, patient states for the past 6 days he has had difficulty swallowing foods.  He has had to pure all of his solid foods, but still coughs/chokes when swallowing.  He states when walking, his head is bent forward and he cannot lift it up.  He used to walk all the time, and 3 weeks ago was living independently in his own home.  He recently moved to an apartment, his daughters are staying with him.  He denies recent fevers, chills, cough, chest pain, shortness of breath, nausea, vomiting, abd pain , urinary symptoms, abnormal bowel movements.  Additional history obtained from patient's daughter.  She states he has had intermittent slurred speech in the past week.  She confirms patient's swallowing and walking difficulties are new in the past week.  Additional history taken chart review.  Patient with a history of CAD, pacemaker present, dyslipidemia, hypertension, solitary kidney.  HPI     Past Medical History:  Diagnosis Date  . Carotid artery disease ([place])   . Coronary artery disease    PCI RCA 2009  . Dyslipidemia   . Hypertension   . Scleritis, unspecified    treated with prednisone  . Seizure [facility])    felt due to cyclosporin in setting of electrolyte  disorder  . Solitary kidney   . Symptomatic bradycardia    a. s/p STJ dual chamber pacemaker    Patient Active Problem List   Diagnosis Date Noted  . Stroke-like symptom 05/12/2020  . Leukocytosis 05/12/2020  . Macrocytosis 05/12/2020  . Stroke-like symptoms 05/12/2020  . Closed fracture of right distal humerus 07/03/2019  . Carotid artery disease ([place]) 09/09/2018  . Pacemaker-[employer]ude 07/03/2012  . Bruit 09/28/2011  . Tongue lesion 08/15/2011  . HYPERTENSION, BENIGN 06/23/2010  . Hyperlipidemia LDL goal <70 09/22/2008  . CAD, NATIVE VESSEL 09/22/2008  . Edema 09/22/2008  . SSS (sick sinus syndrome) ([place]) 09/22/2008    Past Surgical History:  Procedure Laterality Date  . CARDIAC CATHETERIZATION     12 years ago?  . COLONOSCOPY    . EYE SURGERY     right eye about 10 years ago  . ORIF HUMERUS FRACTURE Right 07/03/2019   Procedure: Right distal humerus open reduction, internal fixation;  Surgeon: Verner Mould, MD;  Location: [facility];  Service: Orthopedics;  Laterality: Right;  . PACEMAKER INSERTION  2009   STJ dual chamber pacemaker implanted by Dr Olevia Perches for symptomatic bradycardia       Family History  Problem Relation Age of Onset  . Arthritis/Rheumatoid Mother     Social History   Tobacco Use  . Smoking status: Never Smoker  . Smokeless tobacco: Never  Used  Vaping Use  . Vaping Use: Never used  Substance Use Topics  . Alcohol use: Yes    Comment: rarely  . Drug use: No    Home Medications Prior to Admission medications   Medication Sig Start Date End Date Taking? Authorizing Provider  acetaminophen (TYLENOL) 325 MG tablet Take 650 mg by mouth every 6 (six) hours as needed for mild pain.   Yes [provider]  amLODipine (NORVASC) 5 MG tablet Take 1 tablet by mouth once daily 09/01/19  Yes Fay Records, MD  aspirin 81 MG tablet Take 81 mg by mouth daily.  07/19/11  Yes Allred, Jeneen Rinks, MD  atorvastatin (LIPITOR) 40 MG tablet TAKE 1 TABLET  BY MOUTH ONCE DAILY AT  6PM Patient taking differently: Take 40 mg by mouth daily.  03/09/20  Yes Fay Records, MD  beta carotene w/minerals (OCUVITE) tablet Take 1 tablet by mouth daily.   Yes [provider]  carvedilol (COREG) 3.125 MG tablet Take 1 tablet by mouth twice daily 02/18/20  Yes Fay Records, MD  clopidogrel (PLAVIX) 75 MG tablet Take 1 tablet by mouth once daily 07/22/19  Yes Fay Records, MD  finasteride (PROSCAR) 5 MG tablet Take 5 mg by mouth daily.   Yes [provider]  fluticasone (FLONASE) 50 MCG/ACT nasal spray Place 1 spray into both nostrils daily as needed for allergies or rhinitis.   Yes [provider]  furosemide (LASIX) 40 MG tablet Take 1 tablet (40 mg total) by mouth 2 (two) times a week. Patient taking differently: Take 40 mg by mouth See admin instructions. Take one tablet by mouth on Mondays and Fridays 01/26/20  Yes Fay Records, MD  Multiple Vitamins-Minerals (CENTRUM SILVER PO) Take 1 capsule by mouth daily.    Yes [provider]  NITROSTAT 0.4 MG SL tablet DISSOLVE ONE TABLET UNDER THE TONGUE EVERY 5 MINUTES AS NEEDED FOR CHEST PAIN.  DO NOT EXCEED A TOTAL OF 3 DOSES IN 15 MINUTES Patient taking differently: Place 0.4 mg under the tongue every 5 (five) minutes as needed for chest pain.  11/19/18  Yes Fay Records, MD  potassium chloride (KLOR-CON) 10 MEQ tablet Take 1 tablet (10 mEq total) by mouth 2 (two) times a week. Take with furosemide 01/26/20  Yes Fay Records, MD  spironolactone (ALDACTONE) 25 MG tablet Take 1/2 (one-half) tablet by mouth once daily Patient taking differently: Take 12.5 mg by mouth daily.  10/14/19  Yes Fay Records, MD  temazepam (RESTORIL) 15 MG capsule Take 15 mg by mouth 2 (two) times daily.    Yes [provider]  HYDROcodone-acetaminophen (NORCO/VICODIN) 5-325 MG tablet Take 1-2 tablets by mouth every 4 (four) hours as needed for moderate pain (pain score 4-6). Patient not taking:  Reported on 05/12/2020 07/04/19   Avanell Shackleton III, MD  oxyCODONE-acetaminophen (PERCOCET/ROXICET) 5-325 MG tablet Take 1 tablet by mouth every 6 (six) hours as needed for severe pain. Patient not taking: Reported on 05/12/2020 06/17/19   Petrucelli, Aldona Bar R, PA-C    Allergies    Cyclosporine, Maxzide [triamterene-hctz], Sulfa antibiotics, Sulfur, and Prednisone  Review of Systems   Review of Systems  HENT: Positive for trouble swallowing.   Neurological: Positive for speech difficulty and weakness.  Hematological: Bruises/bleeds easily.  All other systems reviewed and are negative.   Physical Exam Updated Vital Signs BP (!) 166/90 (BP Location: Right Arm)   Pulse 78   Temp 97.7 F (  36.5 C) (Axillary)   Resp 16   Ht 5\' 10"  (1.778 m)   Wt 81.6 kg   SpO2 97%   BMI 25.83 kg/m   Physical Exam Vitals and nursing note reviewed.  Constitutional:      General: He is not in acute distress.    Appearance: He is well-developed.     Comments: Appears nontoxic  HENT:     Head: Normocephalic and atraumatic.  Eyes:     Extraocular Movements: Extraocular movements intact.     Conjunctiva/sclera: Conjunctivae normal.     Pupils: Pupils are equal, round, and reactive to light.     Comments: When talking, keeping right eye closed.  However able to open it upon request.  EOMI and PERRLA.  Cardiovascular:     Rate and Rhythm: Normal rate and regular rhythm.     Pulses: Normal pulses.  Pulmonary:     Effort: Pulmonary effort is normal. No respiratory distress.     Breath sounds: Normal breath sounds. No wheezing.  Abdominal:     General: There is no distension.     Palpations: Abdomen is soft. There is no mass.     Tenderness: There is no abdominal tenderness. There is no guarding or rebound.  Musculoskeletal:        General: Normal range of motion.     Cervical back: Normal range of motion and neck supple.  Skin:    General: Skin is warm and dry.     Capillary Refill:  Capillary refill takes less than 2 seconds.  Neurological:     Mental Status: He is alert and oriented to person, place, and time.     GCS: GCS eye subscore is 4. GCS verbal subscore is 5. GCS motor subscore is 6.     Cranial Nerves: Cranial nerves are intact.     Sensory: Sensation is intact.     Motor: Motor function is intact. No pronator drift.     Coordination: Coordination is intact.     Comments: CN intact.  Nose to finger intact.  Heel to shin intact.  Strength and sensation intact x4     ED Results / Procedures / Treatments   Labs (all labs ordered are listed, but only abnormal results are displayed) Labs Reviewed  APTT - Abnormal; Notable for the following components:      Result Value   aPTT 37 (*)    All other components within normal limits  CBC - Abnormal; Notable for the following components:   WBC 14.2 (*)    MCV 101.4 (*)    All other components within normal limits  DIFFERENTIAL - Abnormal; Notable for the following components:   Neutro Abs 11.8 (*)    Monocytes Absolute 1.2 (*)    Abs Immature Granulocytes 0.08 (*)    All other components within normal limits  COMPREHENSIVE METABOLIC PANEL - Abnormal; Notable for the following components:   Glucose, Bld 104 (*)    GFR calc non Af Amer 59 (*)    All other components within normal limits  CBC - Abnormal; Notable for the following components:   WBC 11.2 (*)    All other components within normal limits  I-STAT CHEM 8, ED - Abnormal; Notable for the following components:   Glucose, Bld 101 (*)    Calcium, Ion 0.93 (*)    All other components within normal limits  URINE CULTURE  SARS CORONAVIRUS 2 BY RT PCR (HOSPITAL ORDER, [place] [facility])  ETHANOL  PROTIME-INR  CREATININE, SERUM  CK  RAPID URINE DRUG SCREEN, HOSP PERFORMED  URINALYSIS, ROUTINE W REFLEX MICROSCOPIC  URINALYSIS, ROUTINE W REFLEX MICROSCOPIC  HEMOGLOBIN A1C  LIPID PANEL  VITAMIN B12  CBC WITH DIFFERENTIAL/PLATELET   BASIC METABOLIC PANEL  FOLATE  TSH    EKG EKG Interpretation  Date/Time:  Wednesday May 12 2020 14:16:26 EDT Ventricular Rate:  77 PR Interval:  184 QRS Duration: 88 QT Interval:  396 QTC Calculation: 448 R Axis:   -14 Text Interpretation: Normal sinus rhythm Normal ECG Left axis deviation since last tracing no significant change Confirmed by Noemi Chapel [phone]) on 05/12/2020 2:24:49 PM   Radiology DG Chest 2 View  Result Date: 05/12/2020 CLINICAL DATA:  Recent fall today with cough and dysphagia, initial encounter EXAM: CHEST - 2 VIEW COMPARISON:  04/09/2020 FINDINGS: Cardiac shadow is stable. Pacing device is again seen. Aortic calcifications are again noted. Mild chronic interstitial changes are again seen stable from the prior study. No focal infiltrate or effusion is seen. No bony abnormality is noted. IMPRESSION: Chronic interstitial changes without acute abnormality. Electronically Signed   By: Inez Catalina M.D.   On: 05/12/2020 18:50   CT Head Wo Contrast  Result Date: 05/12/2020 CLINICAL DATA:  Fall EXAM: CT HEAD WITHOUT CONTRAST CT MAXILLOFACIAL WITHOUT CONTRAST TECHNIQUE: Multidetector CT imaging of the head and maxillofacial structures were performed using the standard protocol without intravenous contrast. Multiplanar CT image reconstructions of the maxillofacial structures were also generated. COMPARISON:  None. FINDINGS: CT HEAD FINDINGS Brain: There is no acute intracranial hemorrhage, mass effect, or edema. Gray-white differentiation is preserved. Prominence of the ventricles and sulci reflects mild generalized parenchymal volume loss. Patchy hypoattenuation in the supratentorial white matter is nonspecific but may reflect mild chronic microvascular ischemic changes. Vascular: Intracranial atherosclerotic calcification at the skull base. Skull: Intact. Other: Mastoid air cells are clear. CT MAXILLOFACIAL FINDINGS Osseous: No acute facial fracture. Degenerative changes  of the left temporomandibular joint. Degenerative changes of the visualized cervical spine. Orbits: No intraorbital hematoma. Sinuses: Mild mucosal thickening. Soft tissues: Unremarkable. IMPRESSION: No evidence of acute intracranial injury. No acute facial fracture. Chronic/nonemergent findings detailed above. Electronically Signed   By: Macy Mis M.D.   On: 05/12/2020 15:57   CT Maxillofacial Wo Contrast  Result Date: 05/12/2020 CLINICAL DATA:  Fall EXAM: CT HEAD WITHOUT CONTRAST CT MAXILLOFACIAL WITHOUT CONTRAST TECHNIQUE: Multidetector CT imaging of the head and maxillofacial structures were performed using the standard protocol without intravenous contrast. Multiplanar CT image reconstructions of the maxillofacial structures were also generated. COMPARISON:  None. FINDINGS: CT HEAD FINDINGS Brain: There is no acute intracranial hemorrhage, mass effect, or edema. Gray-white differentiation is preserved. Prominence of the ventricles and sulci reflects mild generalized parenchymal volume loss. Patchy hypoattenuation in the supratentorial white matter is nonspecific but may reflect mild chronic microvascular ischemic changes. Vascular: Intracranial atherosclerotic calcification at the skull base. Skull: Intact. Other: Mastoid air cells are clear. CT MAXILLOFACIAL FINDINGS Osseous: No acute facial fracture. Degenerative changes of the left temporomandibular joint. Degenerative changes of the visualized cervical spine. Orbits: No intraorbital hematoma. Sinuses: Mild mucosal thickening. Soft tissues: Unremarkable. IMPRESSION: No evidence of acute intracranial injury. No acute facial fracture. Chronic/nonemergent findings detailed above. Electronically Signed   By: Macy Mis M.D.   On: 05/12/2020 15:57    Procedures Procedures (including critical care time)  Medications Ordered in ED Medications   stroke: mapping our early stages of recovery book (has no administration in time range)  acetaminophen (TYLENOL) tablet 650 mg (has no administration in time range)    Or  acetaminophen (TYLENOL) 160 MG/5ML solution 650 mg (has no administration in time range)    Or  acetaminophen (TYLENOL) suppository 650 mg (has no administration in time range)  enoxaparin (LOVENOX) injection 40 mg (has no administration in time range)  aspirin suppository 300 mg (has no administration in time range)    ED Course  I have reviewed the triage vital signs and the nursing notes.  Pertinent labs & imaging results that were available during my care of the patient were reviewed by me and considered in my medical decision making (see chart for details).    MDM Rules/Calculators/A&P                          Pt presenting for evaluation of fall, swallowing difficulty, and weakness.  On exam, patient without focal neurologic deficits.  However I am concerned about his new difficulty swallowing, walking, and slurred speech.  Concern for stroke.  However patient also fell today and is on Plavix and aspirin, he will need head CT.  Will obtain labs to ensure no obvious metabolic abnormality or signs of infection.  Case discussed with attending, Dr. Sabra Heck evaluated the patient.  CT head negative for acute findings including bleed.  Labs overall reassuring.  Mild leukocytosis, however electrolytes stable.  Urine pending. Pt failed swallow study performed by RN.   Discussed with Dr. Cheral Marker from neurology, who recommends MRI and admission to medicine. Recommends holding statin and obtaining a CT  MRI ordered, however unfortunately patient is unable to get this due to implanted device.  Discussed with Dr. Cheral Marker again, who continues to recommend medicine admit.  Request medicine consult if needed for further neurology input.  Discussed with Dr. Doristine Bosworth [facility], patient to be admitted.  Final Clinical Impression(s) / ED Diagnoses Final diagnoses:  Dysphagia, unspecified type   Weakness    Rx / DC Orders ED Discharge Orders    None       Erick Alley 05/12/20 2013    Noemi Chapel, MD 05/14/20 [phone]

## 2020-05-12 NOTE — Progress Notes (Signed)
Pt has MR Unsafe cardiac implant. Cannot perform MRI. Ordering provider aware.

## 2020-05-12 NOTE — Progress Notes (Deleted)
[facility] Clinic Note - Initial Visit   Date: 05/12/20  [name] MRN: [id number] DOB: 12-01-1930   Dear Dr Shirline Frees, MD:  Thank you for your kind referral of [name] for consultation of ***. Although his history is well known to you, please allow [place] to reiterate it for the purpose of our medical record. The patient was accompanied to the clinic by *** who also provides collateral information.     History of Present Illness: [name] is a 86 y.o. ***-handed male with hypertension, BPH, hyperlipidemia, CAD, s/p pacemaker, squamous cell carcinoma, *** presenting for evaluation of dysphagia dysarthria.    Out-side paper records, electronic medical record, and images have been reviewed where available and summarized as: *** Lab Results  Component Value Date   HGBA1C  07/10/2008    5.6 (NOTE)   The ADA recommends the following therapeutic goal for glycemic   control related to Hgb A1C measurement:   Goal of Therapy:   < 7.0% Hgb A1C   Reference: American Diabetes Association: Clinical Practice   Recommendations 2008, Diabetes Care,  2008, 31:(Suppl 1).   Lab Results  Component Value Date   [id number] 841 03/11/2007   Lab Results  Component Value Date   TSH 1.29 08/14/2011   No results found for: ESRSEDRATE, POCTSEDRATE  Past Medical History:  Diagnosis Date  . Carotid artery disease ([place])   . Coronary artery disease    PCI RCA 2009  . Dyslipidemia   . Hypertension   . Scleritis, unspecified    treated with prednisone  . Seizure [facility])    felt due to cyclosporin in setting of electrolyte disorder  . Solitary kidney   . Symptomatic bradycardia    a. s/p STJ dual chamber pacemaker    Past Surgical History:  Procedure Laterality Date  . CARDIAC CATHETERIZATION     12 years ago?  . COLONOSCOPY    . EYE SURGERY     right eye about 10 years ago  . ORIF HUMERUS FRACTURE Right 07/03/2019   Procedure: Right distal  humerus open reduction, internal fixation;  Surgeon: Verner Mould, MD;  Location: [facility] [facility];  Service: Orthopedics;  Laterality: Right;  . PACEMAKER INSERTION  2009   STJ dual chamber pacemaker implanted by Dr Olevia Perches for symptomatic bradycardia     Medications:  Outpatient Encounter Medications as of 05/14/2020  Medication Sig  . acetaminophen (TYLENOL) 325 MG tablet Take 650 mg by mouth every 6 (six) hours as needed for mild pain.  Marland Kitchen amLODipine (NORVASC) 5 MG tablet Take 1 tablet by mouth once daily  . aspirin 81 MG tablet Take 81 mg by mouth daily.   Marland Kitchen atorvastatin (LIPITOR) 40 MG tablet TAKE 1 TABLET BY MOUTH ONCE DAILY AT  6PM  . beta carotene w/minerals (OCUVITE) tablet Take 1 tablet by mouth daily.  . carvedilol (COREG) 3.125 MG tablet Take 1 tablet by mouth twice daily  . clopidogrel (PLAVIX) 75 MG tablet Take 1 tablet by mouth once daily  . finasteride (PROSCAR) 5 MG tablet Take 5 mg by mouth daily.  . furosemide (LASIX) 40 MG tablet Take 1 tablet (40 mg total) by mouth 2 (two) times a week.  Marland Kitchen HYDROcodone-acetaminophen (NORCO/VICODIN) 5-325 MG tablet Take 1-2 tablets by mouth every 4 (four) hours as needed for moderate pain (pain score 4-6).  Marland Kitchen metroNIDAZOLE (METROCREAM) 0.75 % cream Apply 1 application topically 2 (two) times daily as needed (skin).   Marland Kitchen  Multiple Vitamins-Minerals (CENTRUM SILVER PO) Take 1 capsule by mouth daily.   Marland Kitchen NITROSTAT 0.4 MG SL tablet DISSOLVE ONE TABLET UNDER THE TONGUE EVERY 5 MINUTES AS NEEDED FOR CHEST PAIN.  DO NOT EXCEED A TOTAL OF 3 DOSES IN 15 MINUTES  . oxyCODONE-acetaminophen (PERCOCET/ROXICET) 5-325 MG tablet Take 1 tablet by mouth every 6 (six) hours as needed for severe pain.  . potassium chloride (KLOR-CON) 10 MEQ tablet Take 1 tablet (10 mEq total) by mouth 2 (two) times a week. Take with furosemide  . spironolactone (ALDACTONE) 25 MG tablet Take 1/2 (one-half) tablet by mouth once daily  . temazepam (RESTORIL) 15 MG capsule Take 15 mg  by mouth 2 (two) times daily.    No facility-administered encounter medications on file as of 05/14/2020.    Allergies:  Allergies  Allergen Reactions  . Cyclosporine     unknown  . Maxzide [Triamterene-Hctz] Other (See Comments)    Adverse reaction-bloating and abdominal pain  . Sulfa Antibiotics   . Sulfur Other (See Comments)    No energy, could not walk   . Prednisone Other (See Comments)    Can tolerate for a short period of time Unknown reaction    Family History: Family History  Problem Relation Age of Onset  . Arthritis/Rheumatoid Mother     Social History: Social History   Tobacco Use  . Smoking status: Never Smoker  . Smokeless tobacco: Never Used  Vaping Use  . Vaping Use: Never used  Substance Use Topics  . Alcohol use: Yes    Comment: rarely  . Drug use: No   Social History   Social History Narrative   Married   Tobacco   etoh    Vital Signs:  There were no vitals taken for this visit.   General Medical Exam:  *** General:  Well appearing, comfortable.   Eyes/ENT: see cranial nerve examination.   Neck:   No carotid bruits. Respiratory:  Clear to auscultation, good air entry bilaterally.   Cardiac:  Regular rate and rhythm, no murmur.   Extremities:  No deformities, edema, or skin discoloration.  Skin:  No rashes or lesions.  Neurological Exam: MENTAL STATUS including orientation to time, place, person, recent and remote memory, attention span and concentration, language, and fund of knowledge is ***normal.  Speech is not dysarthric.  CRANIAL NERVES: II:  No visual field defects.  Unremarkable fundi.   III-IV-VI: Pupils equal round and reactive to light.  Normal conjugate, extra-ocular eye movements in all directions of gaze.  No nystagmus.  No ptosis***.   V:  Normal facial sensation.    VII:  Normal facial symmetry and movements.   VIII:  Normal hearing and vestibular function.   IX-X:  Normal palatal movement.   XI:  Normal shoulder  shrug and head rotation.   XII:  Normal tongue strength and range of motion, no deviation or fasciculation.  MOTOR:  No atrophy, fasciculations or abnormal movements.  No pronator drift.   Upper Extremity:  Right  Left  Deltoid  5/5   5/5   Biceps  5/5   5/5   Triceps  5/5   5/5   Infraspinatus 5/5  5/5  Medial pectoralis 5/5  5/5  Wrist extensors  5/5   5/5   Wrist flexors  5/5   5/5   Finger extensors  5/5   5/5   Finger flexors  5/5   5/5   Dorsal interossei  5/5   5/5  Abductor pollicis  5/5   5/5   Tone (Ashworth scale)  0  0   Lower Extremity:  Right  Left  Hip flexors  5/5   5/5   Hip extensors  5/5   5/5   Adductor 5/5  5/5  Abductor 5/5  5/5  Knee flexors  5/5   5/5   Knee extensors  5/5   5/5   Dorsiflexors  5/5   5/5   Plantarflexors  5/5   5/5   Toe extensors  5/5   5/5   Toe flexors  5/5   5/5   Tone (Ashworth scale)  0  0   MSRs:  Right        Left                  brachioradialis 2+  2+  biceps 2+  2+  triceps 2+  2+  patellar 2+  2+  ankle jerk 2+  2+  Hoffman no  no  plantar response down  down   SENSORY:  Normal and symmetric perception of light touch, pinprick, vibration, and proprioception.  Romberg's sign absent.   COORDINATION/GAIT: Normal finger-to- nose-finger and heel-to-shin.  Intact rapid alternating movements bilaterally.  Able to rise from a chair without using arms.  Gait narrow based and stable. Tandem and stressed gait intact.    IMPRESSION: ***  PLAN/RECOMMENDATIONS:  *** Return to clinic in *** months.  Total time spent: ***   Thank you for allowing me to participate in patient's care.  If I can answer any additional questions, I would be pleased to do so.    Sincerely,    Eithen Castiglia K. Posey Pronto, DO

## 2020-05-13 ENCOUNTER — Inpatient Hospital Stay (HOSPITAL_COMMUNITY): Payer: Medicare Other

## 2020-05-13 DIAGNOSIS — R1310 Dysphagia, unspecified: Secondary | ICD-10-CM

## 2020-05-13 DIAGNOSIS — G7000 Myasthenia gravis without (acute) exacerbation: Secondary | ICD-10-CM

## 2020-05-13 LAB — LIPID PANEL
Cholesterol: 138 mg/dL (ref 0–200)
HDL: 53 mg/dL (ref >40)
LDL Cholesterol: 70 mg/dL (ref 0–99)
Total CHOL/HDL Ratio: 2.6 RATIO
Triglycerides: 77 mg/dL (ref <150)
VLDL: 15 mg/dL (ref 0–40)

## 2020-05-13 LAB — BASIC METABOLIC PANEL
Anion gap: 16 — ABNORMAL HIGH (ref 5–15)
BUN: 20 mg/dL (ref 8–23)
CO2: 21 mmol/L — ABNORMAL LOW (ref 22–32)
Calcium: 9.2 mg/dL (ref 8.9–10.3)
Chloride: 99 mmol/L (ref 98–111)
Creatinine, Ser: 1.12 mg/dL (ref 0.61–1.24)
GFR calc Af Amer: >60 mL/min (ref >60)
GFR calc non Af Amer: 59 mL/min — ABNORMAL LOW (ref >60)
Glucose, Bld: 86 mg/dL (ref 70–99)
Potassium: 3.9 mmol/L (ref 3.5–5.1)
Sodium: 136 mmol/L (ref 135–145)

## 2020-05-13 LAB — CBC WITH DIFFERENTIAL/PLATELET
Abs Immature Granulocytes: 0.04 10*3/uL (ref 0.00–0.07)
Basophils Absolute: 0 10*3/uL (ref 0.0–0.1)
Basophils Relative: 1 %
Eosinophils Absolute: 0.4 10*3/uL (ref 0.0–0.5)
Eosinophils Relative: 5 %
HCT: 41.6 % (ref 39.0–52.0)
Hemoglobin: 13.5 g/dL (ref 13.0–17.0)
Immature Granulocytes: 1 %
Lymphocytes Relative: 10 %
Lymphs Abs: 0.8 10*3/uL (ref 0.7–4.0)
MCH: 31.8 pg (ref 26.0–34.0)
MCHC: 32.5 g/dL (ref 30.0–36.0)
MCV: 98.1 fL (ref 80.0–100.0)
Monocytes Absolute: 1 10*3/uL (ref 0.1–1.0)
Monocytes Relative: 12 %
Neutro Abs: 6.3 10*3/uL (ref 1.7–7.7)
Neutrophils Relative %: 71 %
Platelets: 162 10*3/uL (ref 150–400)
RBC: 4.24 MIL/uL (ref 4.22–5.81)
RDW: 14.2 % (ref 11.5–15.5)
WBC: 8.7 10*3/uL (ref 4.0–10.5)
nRBC: 0 % (ref 0.0–0.2)

## 2020-05-13 LAB — HEMOGLOBIN A1C
Hgb A1c MFr Bld: 6.1 % — ABNORMAL HIGH (ref 4.8–5.6)
Mean Plasma Glucose: 128.37 mg/dL

## 2020-05-13 LAB — FOLATE: Folate: 44.4 ng/mL (ref >5.9)

## 2020-05-13 MED ORDER — TEMAZEPAM 15 MG PO CAPS
15.0000 mg | ORAL_CAPSULE | Freq: Every day | ORAL | Status: DC
  Administered 2020-05-13: 15 mg via ORAL
  Filled 2020-05-13: qty 1

## 2020-05-13 MED ORDER — FLUTICASONE PROPIONATE 50 MCG/ACT NA SUSP
1.0000 | Freq: Every day | NASAL | Status: DC | PRN
  Administered 2020-05-13 – 2020-05-15 (×2): 1 via NASAL
  Filled 2020-05-13 (×2): qty 16

## 2020-05-13 MED ORDER — ADULT MULTIVITAMIN W/MINERALS CH
1.0000 | ORAL_TABLET | Freq: Every day | ORAL | Status: DC
  Administered 2020-05-13 – 2020-05-15 (×3): 1 via ORAL
  Filled 2020-05-13 (×3): qty 1

## 2020-05-13 MED ORDER — FINASTERIDE 5 MG PO TABS
5.0000 mg | ORAL_TABLET | Freq: Every day | ORAL | Status: DC
  Administered 2020-05-13 – 2020-05-15 (×3): 5 mg via ORAL
  Filled 2020-05-13 (×3): qty 1

## 2020-05-13 MED ORDER — CARVEDILOL 3.125 MG PO TABS
3.1250 mg | ORAL_TABLET | Freq: Two times a day (BID) | ORAL | Status: DC
  Administered 2020-05-13 – 2020-05-14 (×3): 3.125 mg via ORAL
  Filled 2020-05-13 (×2): qty 1

## 2020-05-13 MED ORDER — CLOPIDOGREL BISULFATE 75 MG PO TABS
75.0000 mg | ORAL_TABLET | Freq: Every day | ORAL | Status: DC
  Administered 2020-05-13 – 2020-05-15 (×3): 75 mg via ORAL
  Filled 2020-05-13 (×3): qty 1

## 2020-05-13 MED ORDER — SPIRONOLACTONE 12.5 MG HALF TABLET: 12.5000 mg | ORAL_TABLET | Freq: Every day | ORAL | Status: DC

## 2020-05-13 MED ORDER — SALINE SPRAY 0.65 % NA SOLN
1.0000 | NASAL | Status: DC | PRN
  Filled 2020-05-13: qty 44

## 2020-05-13 MED ORDER — ATORVASTATIN CALCIUM 40 MG PO TABS
40.0000 mg | ORAL_TABLET | Freq: Every day | ORAL | Status: DC
  Administered 2020-05-13: 40 mg via ORAL
  Filled 2020-05-13: qty 1

## 2020-05-13 MED ORDER — ASPIRIN EC 81 MG PO TBEC
81.0000 mg | DELAYED_RELEASE_TABLET | Freq: Every day | ORAL | Status: DC
  Administered 2020-05-13 – 2020-05-15 (×3): 81 mg via ORAL
  Filled 2020-05-13 (×3): qty 1

## 2020-05-13 MED ORDER — SPIRONOLACTONE 12.5 MG HALF TABLET
12.5000 mg | ORAL_TABLET | Freq: Every day | ORAL | Status: DC
  Administered 2020-05-14 – 2020-05-15 (×2): 12.5 mg via ORAL
  Filled 2020-05-13 (×2): qty 1

## 2020-05-13 MED ORDER — ORAL CARE MOUTH RINSE
15.0000 mL | Freq: Two times a day (BID) | OROMUCOSAL | Status: DC
  Administered 2020-05-13 – 2020-05-15 (×4): 15 mL via OROMUCOSAL

## 2020-05-13 MED ORDER — ENSURE ENLIVE PO LIQD
237.0000 mL | Freq: Two times a day (BID) | ORAL | Status: DC
  Administered 2020-05-13 – 2020-05-15 (×4): 237 mL via ORAL

## 2020-05-13 MED ORDER — CHLORHEXIDINE GLUCONATE 0.12 % MT SOLN
15.0000 mL | Freq: Two times a day (BID) | OROMUCOSAL | Status: DC
  Administered 2020-05-13 – 2020-05-15 (×4): 15 mL via OROMUCOSAL
  Filled 2020-05-13 (×4): qty 15

## 2020-05-13 NOTE — Progress Notes (Signed)
Initial Nutrition Assessment  DOCUMENTATION CODES:   Not applicable  INTERVENTION:   Ensure Enlive po BID, each supplement provides 350 kcal and 20 grams of protein  MVI daily   NUTRITION DIAGNOSIS:   Inadequate oral intake related to dysphagia as evidenced by per patient/family report, other (comment) (dysphagia 1 diet).    GOAL:   Patient will meet greater than or equal to 90% of their needs    MONITOR:   PO intake, Supplement acceptance, Weight trends, Labs, I & O's, Diet advancement  REASON FOR ASSESSMENT:   Malnutrition Screening Tool    ASSESSMENT:   Pt admitted with 1-week history of dysphagia, slurred speech and generalized weakness. PMH includes HTN, BPH, HLD, CAD s/p stent placement, symptomatic bradycardia s/p pacemaker placement  Pt unavailable at time of RD visit.  Per H&P, pt reported that he has been having difficulty swallowing food/liquids since ~10 days PTA.   After Neurology assessment, MD feels that pt's presentation is more consistent with myasthenia gravis as opposed to a stroke.   SLP is recommending a Dysphagia 1 diet with thin liquids at this time with MBSS to follow. No PO intake documented.  Labs and medications reviewed.     NUTRITION - FOCUSED PHYSICAL EXAM:  Unable to perform at this time, will attempt at follow-up.   Diet Order:   Diet Order            DIET - DYS 1 Room service appropriate? Yes; Fluid consistency: Thin  Diet effective now                 EDUCATION NEEDS:   No education needs have been identified at this time  Skin:  Skin Assessment: Reviewed RN Assessment  Last BM:  8/24  Height:   Ht Readings from Last 1 Encounters:  05/13/20 5\' 11"  (1.803 m)    Weight:   Wt Readings from Last 1 Encounters:  05/13/20 75.8 kg    BMI:  Body mass index is 23.29 kg/m.  Estimated Nutritional Needs:   Kcal:  1900-2100  Protein:  95-105 grams  Fluid:  >/= 1.9L/d    Larkin Ina, MS, RD, LDN RD  pager number and weekend/on-call pager number located in [place].

## 2020-05-13 NOTE — TOC Initial Note (Signed)
Transition of Care [facility]) - Initial/Assessment Note    Patient Details  Name: [name] MRN: [id number] Date of Birth: [birth year]  Transition of [facility]) CM/SW Contact:    Geralynn Ochs, LCSW Phone Number: 05/13/2020, 4:23 PM  Clinical Narrative:        Patient from home alone, with intermittent support from daughters. CSW discussed home health, provided choice, and patient chose [facility]. CSW confirmed patient has a walker and his daughters are getting him a tub bench. CSW contacted daughters to confirm, and they will follow up on tub bench and ensure that the patient is ok when he discharges home. Daughters asked questions about test results and CSW redirected them to RN. CSW provided referral to [facility].           Expected Discharge Plan: [place] Barriers to Discharge: Continued Medical Work up   Patient Goals and CMS Choice Patient states their goals for this hospitalization and ongoing recovery are:: to get home CMS Medicare.gov Compare Post Acute Care list provided to:: Patient Choice offered to / list presented to : Patient  Expected Discharge Plan and Services Expected Discharge Plan: [place] Choice: [place] arrangements for the past 2 months: Apartment                           HH Arranged: PT, Nurse's Aide, Speech Therapy HH Agency[facility] [place] Date [facility] Agency Contacted: 05/13/20   Representative spoke with at [employer]: [place]  Prior Living Arrangements/Services Living arrangements for the past 2 months: [place] with:: Self Patient language and need for interpreter reviewed:: No Do you feel safe going back to the place where you live?: Yes      Need for Family Participation in Patient Care: No (Comment) Care giver support system in place?: No (comment) Current home services: DME Criminal Activity/Legal Involvement Pertinent to Current Situation/Hospitalization:  No - Comment as needed  Activities of Daily Living   ADL Screening (condition at time of admission) Patient's cognitive ability adequate to safely complete daily activities?: Yes Is the patient deaf or have difficulty hearing?: No Does the patient have difficulty seeing, even when wearing glasses/contacts?: No Does the patient have difficulty concentrating, remembering, or making decisions?: No Patient able to express need for assistance with ADLs?: Yes Does the patient have difficulty dressing or bathing?: Yes Independently performs ADLs?: No Communication: Independent Dressing (OT): Needs assistance Is this a change from baseline?: Pre-admission baseline Grooming: Needs assistance Is this a change from baseline?: Pre-admission baseline Toileting: Needs assistance In/Out Bed: Needs assistance Is this a change from baseline?: Pre-admission baseline Walks in Home: Independent Does the patient have difficulty walking or climbing stairs?: Yes Weakness of Legs: Both Weakness of Arms/Hands: Both  Permission Sought/Granted Permission sought to share information with : Family Supports Permission granted to share information with : Yes, Verbal Permission Granted  Share Information with NAME: Silverio Lay     Permission granted to share info w Relationship: Daughters     Emotional Assessment Appearance:: Appears stated age Attitude/Demeanor/Rapport: Engaged Affect (typically observed): Appropriate Orientation: : Oriented to Self, Oriented to Place, Oriented to  Time, Oriented to Situation Alcohol / Substance Use: Not Applicable Psych Involvement: No (comment)  Admission diagnosis:  Weakness [R53.1] Stroke-like symptoms [R29.90] Dysphagia, unspecified type [R13.10] Patient Active Problem List   Diagnosis Date Noted  .  Stroke-like symptom 05/12/2020  . Leukocytosis 05/12/2020  . Macrocytosis 05/12/2020  . Stroke-like symptoms 05/12/2020  . Closed fracture of right distal  humerus 07/03/2019  . Carotid artery disease ([place]) 09/09/2018  . Pacemaker-St.Jude 07/03/2012  . Bruit 09/28/2011  . Tongue lesion 08/15/2011  . HYPERTENSION, BENIGN 06/23/2010  . Hyperlipidemia LDL goal <70 09/22/2008  . CAD, NATIVE VESSEL 09/22/2008  . Edema 09/22/2008  . SSS (sick sinus syndrome) ([place]) 09/22/2008   PCP:  Shirline Frees, MD Pharmacy:   [place], [place] - [address] [facility] [place] [address] Phone: [phone] Fax: [phone]  [place], [place] [place] [address] [facility] [address] Phone: [phone] Fax: [phone]  [facility] - [place], [place] - [address] [place] [address] Phone: [phone] Fax: [phone]     Social Determinants of Health (SDOH) Interventions    Readmission Risk Interventions No flowsheet data found.

## 2020-05-13 NOTE — Plan of Care (Signed)
  Problem: Coping: Goal: Will verbalize positive feelings about self Outcome: Progressing Goal: Will identify appropriate support needs Outcome: Progressing   Problem: Health Behavior/Discharge Planning: Goal: Ability to manage health-related needs will improve Outcome: Progressing   Problem: Ischemic Stroke/TIA Tissue Perfusion: Goal: Complications of ischemic stroke/TIA will be minimized Outcome: Progressing

## 2020-05-13 NOTE — Evaluation (Signed)
Physical Therapy Evaluation Patient Details Name: [name] MRN: [id number] DOB: 02-27-1931 Today's Date: 05/13/2020   History of Present Illness  This 86 y.o. male admitted with dysphagia, slurred speech, generalized weakness, and fall.   CT of head showed no acute intracranial injury.  Work up underway.  PMH includes:  symptomatic bradycardia, solitary kidney, HTN, CAD, s/p ORIF of Rt humerus, s/p pacemaker     Clinical Impression  Patient received in bed, agrees to PT session. Reports he is feeling good, continues to have difficulty swallowing and speech is better, but describes more deep voice than usual. He performed bed mobility with mod independence, transfers with min guard. Ambulated 200 feet with hand held assist, O2 saturation on room air in the low 90%s with activity. He reports no dizziness, and no abnormal fatigue with ambulation. He will continue to benefit from skilled PT while here to improve strength and functional independence for return home at discharge.         Follow Up Recommendations Home health PT    Equipment Recommendations  None recommended by PT    Recommendations for Other Services       Precautions / Restrictions Precautions Precautions: Fall Restrictions Weight Bearing Restrictions: No      Mobility  Bed Mobility Overal bed mobility: Modified Independent             General bed mobility comments: bed rails, increased time  Transfers Overall transfer level: Needs assistance Equipment used: None Transfers: Sit to/from Stand Sit to Stand: Min guard            Ambulation/Gait Ambulation/Gait assistance: Min assist Gait Distance (Feet): 200 Feet Assistive device: 1 person hand held assist Gait Pattern/deviations: Step-through pattern;Drifts right/left;Trunk flexed Gait velocity: decr   General Gait Details: patient with mild unsteadiness with gait, no reports of dizziness.  Stairs            Wheelchair Mobility     Modified Rankin (Stroke Patients Only)       Balance Overall balance assessment: Mild deficits observed, not formally tested                                           Pertinent Vitals/Pain Pain Assessment: No/denies pain    Home Living Family/patient expects to be discharged to:: Private residence Living Arrangements: Alone Available Help at Discharge: Family;Available PRN/intermittently Type of Home: Apartment Home Access: Level entry     Home Layout: One level Home Equipment: Walker - 2 wheels;Cane - single point      Prior Function           Comments: independent prior to admission     Hand Dominance   Dominant Hand: Right    Extremity/Trunk Assessment   Upper Extremity Assessment Upper Extremity Assessment: Defer to OT evaluation    Lower Extremity Assessment Lower Extremity Assessment: Generalized weakness    Cervical / Trunk Assessment Cervical / Trunk Assessment: Kyphotic  Communication   Communication: No difficulties  Cognition Arousal/Alertness: Awake/alert Behavior During Therapy: WFL for tasks assessed/performed Overall Cognitive Status: Within Functional Limits for tasks assessed                                 General Comments: daughter reports his speech is not back to normal but I have no difficulty understanding  him      General Comments      Exercises     Assessment/Plan    PT Assessment Patient needs continued PT services  PT Problem List Decreased strength;Decreased mobility;Decreased balance       PT Treatment Interventions Therapeutic activities;Gait training;Therapeutic exercise;Patient/family education;Functional mobility training;Balance training;Stair training    PT Goals (Current goals can be found in the Care Plan section)  Acute Rehab PT Goals Patient Stated Goal: to return home PT Goal Formulation: With patient Time For Goal Achievement: 05/27/20 Potential to Achieve Goals:  Good    Frequency Min 3X/week   Barriers to discharge Decreased caregiver support      Co-evaluation               AM-PAC PT "6 Clicks" Mobility  Outcome Measure Help needed turning from your back to your side while in a flat bed without using bedrails?: None Help needed moving from lying on your back to sitting on the side of a flat bed without using bedrails?: None Help needed moving to and from a bed to a chair (including a wheelchair)?: A Little Help needed standing up from a chair using your arms (e.g., wheelchair or bedside chair)?: A Little Help needed to walk in hospital room?: A Little Help needed climbing 3-5 steps with a railing? : A Little 6 Click Score: 20    End of Session Equipment Utilized During Treatment: Gait belt Activity Tolerance: Patient tolerated treatment well Patient left: in bed;with call bell/phone within reach;with bed alarm set;with family/visitor present Nurse Communication: Mobility status PT Visit Diagnosis: Unsteadiness on feet (R26.81);Muscle weakness (generalized) (M62.81);Other abnormalities of gait and mobility (R26.89)    Time: 8889-1694 PT Time Calculation (min) (ACUTE ONLY): 54 min   Charges:   PT Evaluation $PT Eval Moderate Complexity: 1 Mod PT Treatments $Gait Training: 8-22 mins        Domnique Vanegas, PT, GCS 05/13/20,1:05 PM

## 2020-05-13 NOTE — Progress Notes (Signed)
PROGRESS NOTE    [name]  UYQ:[id number] DOB: 05/20/1931 DOA: 05/12/2020 PCP: Shirline Frees, MD   Brief Narrative: 86 year old with past medical history significant for hypertension, BPH, hyperlipidemia, CAD status post a stent, symptomatic bradycardia status post pacemaker, presented to the emergency department complaining of dysphagia, slurry speech, weakness, balance problem and fall. He report cough was one month. Daughter noticed that he has not been able to hold his head up. CT head negative for acute finding. Unable to do MRI, due to pacemaker, not safe.     Assessment & Plan:   Principal Problem:   Stroke-like symptom Active Problems:   Hyperlipidemia LDL goal <70   HYPERTENSION, BENIGN   SSS (sick sinus syndrome) (HCC)   Pacemaker-St.Jude   Leukocytosis   Macrocytosis   Stroke-like symptoms  1-Dysphagia, slurred speech, gait problems, weakness; fall CT head negative.  Oxygen on ambulation above 90. Repeat again tomorrow.  No evidence of infection.  Pacemaker is incompatible, not safe to perform MRI.  Neurology consulted.  Speech swallow evaluation.  B 12, TSH normal. CK normal.  Medications effect? Levocetirizine, temazepam. ?   2-HTN;  Resume coreg today Resume spironolactone 8/27  3-Leukocytosis: Reactive. Chest x-ray negative for pneumonia. Resolved  4-Macrocytosis: G38 normal Folic acid Normal.  CKD stage II: Is stable  BPH: Resume Proscar Carotid artery stenosis: Continue with aspirin statin and Plavix      Nutrition Problem: Inadequate oral intake Etiology: dysphagia    Signs/Symptoms: per patient/family report, other (comment) (dysphagia 1 diet)    Interventions: Ensure Enlive (each supplement provides 350kcal and 20 grams of protein), MVI  Estimated body mass index is 23.29 kg/m as calculated from the following:   Height as of this encounter: 5\' 11"  (1.803 m).   Weight as of this encounter: 75.8 kg.   DVT prophylaxis:  Lovenox Code Status: Full code Family Communication: Care discussed with daughter who was at bedside Disposition Plan:  Status is: Inpatient  Remains inpatient appropriate because:Ongoing diagnostic testing needed not appropriate for outpatient work up   Dispo: The patient is from: Home              Anticipated d/c is to: Home              Anticipated d/c date is: 2 days              Patient currently is not medically stable to d/c.        Consultants:   Neurology  Procedures:   None  Antimicrobials:    Subjective: He is alert, he passed a swallow evaluation this morning but he is going for the modified barium swallow this afternoon. He has been having this chronic cough for a month now. Difficulty with swallowing and some slurred speech for a week. He was recently started on levocetirizine for cough. He has been taking temazepam for many many years, he takes 1 tablet at 11 and then at 3 AM to help him sleep  Objective: Vitals:   05/13/20 0343 05/13/20 0726 05/13/20 1123 05/13/20 1253  BP: 120/80 (!) 156/69 (!) 168/79   Pulse: 70 73 77   Resp: 16 16 18    Temp: 97.6 F (36.4 C) (!) 97.5 F (36.4 C) 98.1 F (36.7 C)   TempSrc:  Oral Oral   SpO2: 94% 96% 96% 93%  Weight: 75.8 kg     Height: 5\' 11"  (1.803 m)       Intake/Output Summary (Last 24 hours) at 05/13/2020 1432  Last data filed at 05/13/2020 0600 Gross per 24 hour  Intake --  Output 600 ml  Net -600 ml   Filed Weights   05/12/20 1800 05/13/20 0343  Weight: 81.6 kg 75.8 kg    Examination:  General exam: Appears calm and comfortable  Respiratory system: Clear to auscultation. Respiratory effort normal. Cardiovascular system: S1 & S2 heard, RRR. No JVD, murmurs, rubs, gallops or clicks. No pedal edema. Gastrointestinal system: Abdomen is nondistended, soft and nontender. No organomegaly or masses felt. Normal bowel sounds heard. Central nervous system: Alert and oriented. No focal neurological  deficits. Extremities: Symmetric 5 x 5 power.   Data Reviewed: I have personally reviewed following labs and imaging studies  CBC: Recent Labs  Lab 05/12/20 1246 05/12/20 1311 05/12/20 1822 05/13/20 0644  WBC 14.2*  --  11.2* 8.7  NEUTROABS 11.8*  --   --  6.3  HGB 14.2 13.9 14.5 13.5  HCT 43.7 41.0 43.8 41.6  MCV 101.4*  --  98.4 98.1  PLT 176  --  188 591   Basic Metabolic Panel: Recent Labs  Lab 05/12/20 1246 05/12/20 1311 05/12/20 1822 05/13/20 0644  NA 137 136  --  136  K 3.7 3.7  --  3.9  CL 100 101  --  99  CO2 26  --   --  21*  GLUCOSE 104* 101*  --  86  BUN 19 21  --  20  CREATININE 1.12 1.00 0.92 1.12  CALCIUM 9.5  --   --  9.2   GFR: Estimated Creatinine Clearance: 49.5 mL/min (by C-G formula based on SCr of 1.12 mg/dL). Liver Function Tests: Recent Labs  Lab 05/12/20 1246  AST 28  ALT 25  ALKPHOS 89  BILITOT 0.9  PROT 7.2  ALBUMIN 3.5   No results for input(s): LIPASE, AMYLASE in the last 168 hours. No results for input(s): AMMONIA in the last 168 hours. Coagulation Profile: Recent Labs  Lab 05/12/20 1246  INR 1.2   Cardiac Enzymes: Recent Labs  Lab 05/12/20 1822  CKTOTAL 87   BNP (last 3 results) Recent Labs    07/18/19 1252  PROBNP 156   HbA1C: Recent Labs    05/13/20 0644  HGBA1C 6.1*   CBG: No results for input(s): GLUCAP in the last 168 hours. Lipid Profile: Recent Labs    05/13/20 0644  CHOL 138  HDL 53  LDLCALC 70  TRIG 77  CHOLHDL 2.6   Thyroid Function Tests: Recent Labs    05/12/20 1822  TSH 1.734   Anemia Panel: Recent Labs    05/12/20 1822  VITAMINB12 956*  FOLATE 44.4   Sepsis Labs: No results for input(s): PROCALCITON, LATICACIDVEN in the last 168 hours.  Recent Results (from the past 240 hour(s))  SARS Coronavirus 2 by RT PCR (hospital order, performed in [facility] hospital lab) Nasopharyngeal Nasopharyngeal Swab     Status: None   Collection Time: 05/12/20  5:48 PM   Specimen:  Nasopharyngeal Swab  Result Value Ref Range Status   SARS Coronavirus 2 NEGATIVE NEGATIVE Final    Comment: (NOTE) SARS-CoV-2 target nucleic acids are NOT DETECTED.  The SARS-CoV-2 RNA is generally detectable in upper and lower respiratory specimens during the acute phase of infection. The lowest concentration of SARS-CoV-2 viral copies this assay can detect is 250 copies / mL. A negative result does not preclude SARS-CoV-2 infection and should not be used as the sole basis for treatment or other patient management decisions.  A negative result may occur with improper specimen collection / handling, submission of specimen other than nasopharyngeal swab, presence of viral mutation(s) within the areas targeted by this assay, and inadequate number of viral copies (<250 copies / mL). A negative result must be combined with clinical observations, patient history, and epidemiological information.  Fact Sheet for Patients:   StrictlyIdeas.no  Fact Sheet for Healthcare Providers: BankingDealers.co.za  This test is not yet approved or  cleared by the [place] FDA and has been authorized for detection and/or diagnosis of SARS-CoV-2 by FDA under an Emergency Use Authorization (EUA).  This EUA will remain in effect (meaning this test can be used) for the duration of the COVID-19 declaration under Section 564(b)(1) of the Act, 21 U.S.C. section 360bbb-3(b)(1), unless the authorization is terminated or revoked sooner.  Performed at [facility], [place] [address], [place], [place] [address]          Radiology Studies: DG Chest 2 View  Result Date: 05/12/2020 CLINICAL DATA:  Recent fall today with cough and dysphagia, initial encounter EXAM: CHEST - 2 VIEW COMPARISON:  04/09/2020 FINDINGS: Cardiac shadow is stable. Pacing device is again seen. Aortic calcifications are again noted. Mild chronic interstitial changes are again seen  stable from the prior study. No focal infiltrate or effusion is seen. No bony abnormality is noted. IMPRESSION: Chronic interstitial changes without acute abnormality. Electronically Signed   By: Inez Catalina M.D.   On: 05/12/2020 18:50   CT Head Wo Contrast  Result Date: 05/12/2020 CLINICAL DATA:  Fall EXAM: CT HEAD WITHOUT CONTRAST CT MAXILLOFACIAL WITHOUT CONTRAST TECHNIQUE: Multidetector CT imaging of the head and maxillofacial structures were performed using the standard protocol without intravenous contrast. Multiplanar CT image reconstructions of the maxillofacial structures were also generated. COMPARISON:  None. FINDINGS: CT HEAD FINDINGS Brain: There is no acute intracranial hemorrhage, mass effect, or edema. Gray-white differentiation is preserved. Prominence of the ventricles and sulci reflects mild generalized parenchymal volume loss. Patchy hypoattenuation in the supratentorial white matter is nonspecific but may reflect mild chronic microvascular ischemic changes. Vascular: Intracranial atherosclerotic calcification at the skull base. Skull: Intact. Other: Mastoid air cells are clear. CT MAXILLOFACIAL FINDINGS Osseous: No acute facial fracture. Degenerative changes of the left temporomandibular joint. Degenerative changes of the visualized cervical spine. Orbits: No intraorbital hematoma. Sinuses: Mild mucosal thickening. Soft tissues: Unremarkable. IMPRESSION: No evidence of acute intracranial injury. No acute facial fracture. Chronic/nonemergent findings detailed above. Electronically Signed   By: Macy Mis M.D.   On: 05/12/2020 15:57   CT Maxillofacial Wo Contrast  Result Date: 05/12/2020 CLINICAL DATA:  Fall EXAM: CT HEAD WITHOUT CONTRAST CT MAXILLOFACIAL WITHOUT CONTRAST TECHNIQUE: Multidetector CT imaging of the head and maxillofacial structures were performed using the standard protocol without intravenous contrast. Multiplanar CT image reconstructions of the maxillofacial  structures were also generated. COMPARISON:  None. FINDINGS: CT HEAD FINDINGS Brain: There is no acute intracranial hemorrhage, mass effect, or edema. Gray-white differentiation is preserved. Prominence of the ventricles and sulci reflects mild generalized parenchymal volume loss. Patchy hypoattenuation in the supratentorial white matter is nonspecific but may reflect mild chronic microvascular ischemic changes. Vascular: Intracranial atherosclerotic calcification at the skull base. Skull: Intact. Other: Mastoid air cells are clear. CT MAXILLOFACIAL FINDINGS Osseous: No acute facial fracture. Degenerative changes of the left temporomandibular joint. Degenerative changes of the visualized cervical spine. Orbits: No intraorbital hematoma. Sinuses: Mild mucosal thickening. Soft tissues: Unremarkable. IMPRESSION: No evidence of acute intracranial injury. No acute facial fracture.  Chronic/nonemergent findings detailed above. Electronically Signed   By: Macy Mis M.D.   On: 05/12/2020 15:57        Scheduled Meds: . enoxaparin (LOVENOX) injection  40 mg Subcutaneous Q24H  . feeding supplement (ENSURE ENLIVE)  237 mL Oral BID BM  . multivitamin with minerals  1 tablet Oral Daily   Continuous Infusions:   LOS: 1 day    Time spent: 35 minutes.     Elmarie Shiley, MD [facility]   If 7PM-7AM, please contact night-coverage [email or web address]  05/13/2020, 2:32 PM

## 2020-05-13 NOTE — Progress Notes (Signed)
Pt. Performed .9L on the vital capacity but could not perform the NIF that well. Pt. Only got -10 on the NIF.

## 2020-05-13 NOTE — Evaluation (Signed)
Clinical/Bedside Swallow Evaluation Patient Details  Name: [name] MRN: [id number] Date of Birth: 1931-09-16  Today's Date: 05/13/2020 Time: SLP Start Time (ACUTE ONLY): 25 SLP Stop Time (ACUTE ONLY): 1140 SLP Time Calculation (min) (ACUTE ONLY): 10 min  Past Medical History:  Past Medical History:  Diagnosis Date  . Carotid artery disease ([place])   . Coronary artery disease    PCI RCA 2009  . Dyslipidemia   . Hypertension   . Scleritis, unspecified    treated with prednisone  . Seizure [facility])    felt due to cyclosporin in setting of electrolyte disorder  . Solitary kidney   . Symptomatic bradycardia    a. s/p STJ dual chamber pacemaker   Past Surgical History:  Past Surgical History:  Procedure Laterality Date  . CARDIAC CATHETERIZATION     12 years ago?  . COLONOSCOPY    . EYE SURGERY     right eye about 10 years ago  . ORIF HUMERUS FRACTURE Right 07/03/2019   Procedure: Right distal humerus open reduction, internal fixation;  Surgeon: Verner Mould, MD;  Location: [place];  Service: Orthopedics;  Laterality: Right;  . PACEMAKER INSERTION  2009   STJ dual chamber pacemaker implanted by Dr Olevia Perches for symptomatic bradycardia   HPI:  [name] is a 86 y.o. male with medical history significant of hypertension, BPH, hyperlipidemia, coronary artery disease status post stent placement, symptomatic bradycardia status post pacemaker placement presents to emergency department with dysphagia, slurred speech, generalized weakness over past 7-10 days.   Assessment / Plan / Recommendation Clinical Impression  Pt demonstrates a suspected pharyngoesophageal dysphagia as evidenced by globus sensation and regurgitation ongoing for the past 7-10 days. Daughter reports he has self limited to a pureed diet and that with pills, he will take them whole then later regurgitate them if he reclines. He showed no oral deficits despite mild R sided facial droop (questionably baseline  from prior surgery). With thin liquids taken in single sips, pt had no difficulty. With sequential sips, pt was noted with an audible regurgitation and several swallows. Given findings, proceed with MBSS and allow dysphagia 1 diet and thin liquids in single sips, alternating solids/liquids pending those results. Further treatment plan to be determined after MBSS.    SLP Visit Diagnosis: Dysphagia, unspecified (R13.10);Dysarthria and anarthria (R47.1)    Aspiration Risk  Mild aspiration risk    Diet Recommendation Dysphagia 1 (Puree);Thin liquid   Supervision: Patient able to self feed Compensations: Slow rate;Follow solids with liquid;Small sips/bites Postural Changes: Seated upright at 90 degrees    Other  Recommendations Oral Care Recommendations: Oral care BID   Follow up Recommendations None      Frequency and Duration min 2x/week  2 weeks       Prognosis Prognosis for Safe Diet Advancement: Good      Swallow Study   General Date of Onset: 05/06/20 HPI: [name] is a 86 y.o. male with medical history significant of hypertension, BPH, hyperlipidemia, coronary artery disease status post stent placement, symptomatic bradycardia status post pacemaker placement presents to emergency department with dysphagia, slurred speech, generalized weakness over past 7-10 days. Type of Study: Bedside Swallow Evaluation Previous Swallow Assessment: n/a Diet Prior to this Study: NPO Temperature Spikes Noted: No Respiratory Status: Room air History of Recent Intubation: No Behavior/Cognition: Alert;Cooperative;Pleasant mood Oral Cavity Assessment: Within Functional Limits Oral Care Completed by SLP: Recent completion by staff Oral Cavity - Dentition: Adequate natural dentition Vision: Functional  for self-feeding Self-Feeding Abilities: Able to feed self Patient Positioning: Upright in bed Baseline Vocal Quality: Normal    Oral/Motor/Sensory Function Overall Oral Motor/Sensory  Function: Mild impairment Facial ROM: Reduced right Facial Symmetry: Abnormal symmetry right   Ice Chips Ice chips: Not tested   Thin Liquid Thin Liquid: Within functional limits Presentation: Straw;Cup    Nectar Thick Nectar Thick Liquid: Not tested   Honey Thick Honey Thick Liquid: Not tested   Puree Puree: Within functional limits Presentation: Spoon   Solid     Solid: Within functional limits Presentation: [place]. Aarti Mankowski, M.S., CCC-SLP Speech-Language Pathologist Acute Rehabilitation Services Pager: [place] 05/13/2020,12:42 PM

## 2020-05-13 NOTE — Progress Notes (Addendum)
Modified Barium Swallow Progress Note  Patient Details  Name: [name] MRN: [id number] Date of Birth: 1930-12-19  Today's Date: 05/13/2020  Modified Barium Swallow completed.  Full report located under Chart Review in the Imaging Section.  Brief recommendations include the following:  Clinical Impression  [name] demonstrates a moderate pharyngeal dysphagia marked by anatomical difference of significantly curved epiglottis toward hyoid, collecting severe vallecular residue. He had reduced hyoid excursion, but fair laryngeal elevation. Reduced hyoid excursion and base of tongue retraction resulted in reduced UES opening and subsequent moderate residue in pyriform sinus/just above UES (see image below). Pt is sensate to residue and attempts multiple swallows and liquid wash, but has limited success. Strategies of chin tuck, head turn L, head turn R, and effortful swallow were all ineffective. He was noted with single instance of trace and transient aspiration with thin liquids and consistent transient deep penetration of thin liquids. Nectar thick liquids were trialed, however, given increased residue with increased viscosity, this is not an effective choice for him at this time. Nectar thick liquids did resolve the aspiration, so should he present with respiratory distress or infection, this would be a good option. At this time, pt appears to be a functional aspirator by clearing aspiration with a spontaneous cough as well as using diligent oral care to prevent infection. Although residue was generally equivalent between solids and purees, pt feels that purees "go down easier," so recommend pureed consistency at his request. May upgrade as he wishes.  Recommend: thin liquids in single sips, dysphagia 1 solids (purees), alternate liquids/solids, intensive dysphagia treatment while on acute and on outpatient level     Swallow Evaluation Recommendations   SLP Diet Recommendations: Dysphagia 1  (Puree) solids;Thin liquid   Liquid Administration via: Straw;Cup   Compensations: Slow rate;Follow solids with liquid;Small sips/bites   Postural Changes: Remain semi-upright after after feeds/meals (Comment);Seated upright at 90 degrees   Oral Care Recommendations: Oral care BID;Patient independent with oral care      Roslynn Holte P. Duell Holdren, M.S., [place] Pathologist Acute Rehabilitation Services Pager: [place] 05/13/2020,3:48 PM

## 2020-05-13 NOTE — Evaluation (Signed)
Occupational Therapy Evaluation Patient Details Name: [name] MRN: [id number] DOB: May 11, 1931 Today's Date: 05/13/2020    History of Present Illness This 86 y.o. male admitted with dysphagia, slurred speech, generalized weakness, and fall.   CT of head showed no acute intracranial injury.  Work up underway.  PMH includes:  symptomatic bradycardia, solitary kidney, HTN, CAD, s/p ORIF of Rt humerus, s/p pacemaker       Pt admitted with above. He demonstrates the below listed deficits and will benefit from continued OT to maximize safety and independence with BADLs.  Pt presents to OT with generalized weakness, decreased activity tolerance, impaired balance.  He currently requires min guard assist for ADLs and functional mobility. He reports he lives alone and has been independent with all ADLs and IADLs until the past couple of weeks in which he has required intermittent assistance from his daughters.  Recommend use of RW for home use and continued intermittent assist at discharge.  Will follow acutely.     Follow Up Recommendations  No OT follow up;Supervision - Intermittent    Equipment Recommendations  Tub/shower seat    Recommendations for Other Services       Precautions / Restrictions Precautions Precautions: Fall Restrictions Weight Bearing Restrictions: No      Mobility Bed Mobility Overal bed mobility: Modified Independent             General bed mobility comments: bed rails, increased time  Transfers Overall transfer level: Needs assistance Equipment used: None;Rolling walker (2 wheeled) Transfers: Sit to/from [facility] Sit to Stand: Min guard Stand pivot transfers: Min guard       General transfer comment: min guard for safety     Balance Overall balance assessment: Mild deficits observed, not formally tested                                         ADL either performed or assessed with clinical judgement   ADL  Overall ADL's : Needs assistance/impaired Eating/Feeding: Independent   Grooming: Wash/dry hands;Wash/dry face;Oral care;Brushing hair;Min guard;Standing   Upper Body Bathing: Set up;Sitting   Lower Body Bathing: Min guard;Sit to/from stand   Upper Body Dressing : Set up;Sitting   Lower Body Dressing: Min guard;Sit to/from stand   Toilet Transfer: Min guard;Ambulation;Comfort height toilet;RW   Toileting- Water quality scientist and Hygiene: Min guard;Sit to/from stand       Functional mobility during ADLs: Min guard;Rolling walker       Vision Baseline Vision/History: Wears glasses Wears Glasses: Reading only Patient Visual Report: No change from baseline Vision Assessment?: Yes Eye Alignment: Within Functional Limits Ocular Range of Motion: Within Functional Limits Alignment/Gaze Preference: Within Defined Limits Tracking/Visual Pursuits: Able to track stimulus in all quads without difficulty Saccades: Within functional limits Visual Fields: No apparent deficits     Agricultural engineer Tested?: Yes   Praxis Praxis Praxis tested?: Within functional limits    Pertinent Vitals/Pain Pain Assessment: No/denies pain     Hand Dominance Right   Extremity/Trunk Assessment Upper Extremity Assessment Upper Extremity Assessment: Overall WFL for tasks assessed   Lower Extremity Assessment Lower Extremity Assessment: Defer to PT evaluation   Cervical / Trunk Assessment Cervical / Trunk Assessment: Kyphotic   Communication Communication Communication: Expressive difficulties;Other (comment) (slurred speech )   Cognition Arousal/Alertness: Awake/alert Behavior During Therapy: WFL for tasks assessed/performed Overall Cognitive Status: Within Functional Limits  for tasks assessed                                 General Comments: daughter reports his speech is not back to normal but I have no difficulty understanding him   General Comments        Exercises     Shoulder Instructions      Home Living Family/patient expects to be discharged to:: Private residence Living Arrangements: Alone Available Help at Discharge: Family;Available PRN/intermittently Type of Home: Apartment Home Access: Level entry     Home Layout: One level     Bathroom Shower/Tub: Occupational psychologist: Handicapped height Bathroom Accessibility: Yes   Home Equipment: Environmental consultant - 2 wheels;Kasandra Knudsen - single point      Lives With: Alone    Prior Functioning/Environment Level of Independence: Needs assistance  Gait / Transfers Assistance Needed: Prior onset of symptoms was walking without AD independently, but Pt reports ambulating long distances with supervision of daughter or neighbor, reports he did not use AD. ADL's / Homemaking Assistance Needed: Pt reports daughters and neighbor assist with cooking, cleaning, or laundry following initial fx, prior to fall he was independent.   Comments: independent prior to admission        OT Problem List: Decreased strength;Decreased activity tolerance;Impaired balance (sitting and/or standing);Decreased knowledge of use of DME or AE      OT Treatment/Interventions: Self-care/ADL training;DME and/or AE instruction;Therapeutic activities;Patient/family education;Balance training    OT Goals(Current goals can be found in the care plan section) Acute Rehab OT Goals Patient Stated Goal: to improve balance and endurance  OT Goal Formulation: With patient Time For Goal Achievement: 05/27/20 Potential to Achieve Goals: Good ADL Goals Pt Will Perform Grooming: with supervision;standing Pt Will Perform Upper Body Bathing: with set-up;sitting Pt Will Perform Lower Body Bathing: with supervision;sit to/from stand Pt Will Perform Upper Body Dressing: with set-up;sitting Pt Will Perform Lower Body Dressing: with supervision;sit to/from stand Pt Will Transfer to Toilet: with supervision;ambulating;regular  height toilet;grab bars Pt Will Perform Toileting - Clothing Manipulation and hygiene: with supervision;sit to/from stand  OT Frequency: Min 2X/week   Barriers to D/C:            Co-evaluation              AM-PAC OT "6 Clicks" Daily Activity     Outcome Measure Help from another person eating meals?: None Help from another person taking care of personal grooming?: A Little Help from another person toileting, which includes using toliet, bedpan, or urinal?: A Little Help from another person bathing (including washing, rinsing, drying)?: A Little Help from another person to put on and taking off regular upper body clothing?: A Little Help from another person to put on and taking off regular lower body clothing?: A Little 6 Click Score: 19   End of Session Equipment Utilized During Treatment: Rolling walker Nurse Communication: Mobility status  Activity Tolerance: Patient tolerated treatment well Patient left: in chair;with call bell/phone within reach;with chair alarm set  OT Visit Diagnosis: Unsteadiness on feet (R26.81)                Time: 9233-0076 OT Time Calculation (min): 38 min Charges:  OT General Charges $OT Visit: 1 Visit OT Evaluation $OT Eval Moderate Complexity: 1 Mod OT Treatments $Self Care/Home Management : 8-22 mins $Therapeutic Activity: 8-22 mins  Nilsa Nutting., OTR/L Acute Rehabilitation Services Pager [phone]  Office [phone]'   Lucille Passy M 05/13/2020, 3:24 PM

## 2020-05-13 NOTE — Consult Note (Addendum)
Neurology Consultation  Reason for Consult: Generalized weakness  Referring Physician: Elmarie Shiley, MD  CC: Generalized fatigue most notably in eyelids, chewing, neck extension with dysarthria and dysphonia  History is obtained from: Daughter and patient  HPI: [name] is a 86 y.o. male with history of symptomatic bradycardia, hypertension, carotid artery disease, dyslipidemia and CAD.  Patient states that approximately 2 weeks prior to him coming to the hospital he noticed he was having cough mostly at night.  At times they were heavy.  This lasted for about 2 weeks.  Approximately 10 days ago he noted he was having difficulty swallowing both food and water.  At times he would choke on his food and other times water would just come back out of his mouth.  6 days ago daughter noted he was having some slurred speech, difficulty swallowing pills, difficulty holding his head up.  When specifically asked if the symptoms are worse when at the end of the day he states yes, clarifying that he is at his strongest in the AM with gradual worsening of his strength, speech and swallowing difficulty as the day progresses. Also gets worse with repetitive use of the same muscles.  He stated also that his eyelids become more droopy if he is concentrating on issues.  He admits that the more he walks the weaker he feels.  At this time he denies any difficulty with breathing.  Patient states he has had no localizing or lateralizing symptoms.  He denies any numbness or diplopia. He denies any prior history of muscle weakness. He also has no muscle aches or pains.   Past Medical History:  Diagnosis Date  . Carotid artery disease ([place])   . Coronary artery disease    PCI RCA 2009  . Dyslipidemia   . Hypertension   . Scleritis, unspecified    treated with prednisone  . Seizure [facility])    felt due to cyclosporin in setting of electrolyte disorder  . Solitary kidney   . Symptomatic bradycardia    a. s/p STJ  dual chamber pacemaker     Family History  Problem Relation Age of Onset  . Arthritis/Rheumatoid Mother    Social History:   reports that he has never smoked. He has never used smokeless tobacco. He reports current alcohol use. He reports that he does not use drugs.  Medications  Current Facility-Administered Medications:  .  acetaminophen (TYLENOL) tablet 650 mg, 650 mg, Oral, Q4H PRN **OR** acetaminophen (TYLENOL) 160 MG/5ML solution 650 mg, 650 mg, Per Tube, Q4H PRN **OR** acetaminophen (TYLENOL) suppository 650 mg, 650 mg, Rectal, Q4H PRN, Pahwani, Rinka R, MD .  enoxaparin (LOVENOX) injection 40 mg, 40 mg, Subcutaneous, Q24H, Pahwani, Rinka R, MD, 40 mg at 05/12/20 1959  ROS:    General ROS: negative for - chills, fatigue, fever, night sweats, weight gain or weight loss Psychological ROS: negative for - behavioral disorder, hallucinations, memory difficulties, mood swings or suicidal ideation Ophthalmic ROS: Positive for - blurry vision ENT ROS: negative for - epistaxis, nasal discharge, oral lesions, sore throat, tinnitus or vertigo Allergy and Immunology ROS: negative for - hives or itchy/watery eyes Hematological and Lymphatic ROS: negative for - bleeding problems, bruising or swollen lymph nodes Endocrine ROS: negative for - galactorrhea, hair pattern changes, polydipsia/polyuria or temperature intolerance Respiratory ROS: negative for - cough, hemoptysis, shortness of breath or wheezing Cardiovascular ROS: negative for - chest pain, dyspnea on exertion, edema or irregular heartbeat Gastrointestinal ROS: negative for - abdominal  pain, diarrhea, hematemesis, nausea/vomiting or stool incontinence Genito-Urinary ROS: negative for - dysuria, hematuria, incontinence or urinary frequency/urgency Musculoskeletal ROS: Positive for -muscular weakness Neurological ROS: as noted in HPI Dermatological ROS: Positive for hematomas  Exam: Current vital signs: BP (!) 168/79 (BP Location:  Left Arm)   Pulse 77   Temp 98.1 F (36.7 C) (Oral)   Resp 18   Ht 5\' 11"  (1.803 m)   Wt 75.8 kg   SpO2 93%   BMI 23.29 kg/m  Vital signs in last 24 hours: Temp:  [97.5 F (36.4 C)-98.1 F (36.7 C)] 98.1 F (36.7 C) (08/26 1123) Pulse Rate:  [60-78] 77 (08/26 1123) Resp:  [16-20] 18 (08/26 1123) BP: (120-180)/(68-91) 168/79 (08/26 1123) SpO2:  [93 %-98 %] 93 % (08/26 1253) Weight:  [75.8 kg-81.6 kg] 75.8 kg (08/26 0343)   Constitutional: Appears well-developed and well-nourished.  Psych: Affect appropriate to situation Eyes: No scleral injection HENT: No OP obstrucion Head: Normocephalic.  Cardiovascular: Normal rate and regular rhythm.  Respiratory: Effort normal, non-labored breathing GI: Soft.  No distension. There is no tenderness.  Skin: WDI  Neuro: Mental Status: Patient is awake, alert, oriented to person, place, month, year, and situation. Speech-is dysarthric, becomes more dysarthric the more patient speaks.  Has no difficulty with naming, repeating or comprehension.  Patient is able to follow all commands asked of him.  Patient is able to give a coherent history. Cranial Nerves: II: Visual Fields are full. PERRL. III,IV, VI: EOMI without diplopia. Patient does have bilateral ptosis that waxes and wanes during the exam. It gets better after he closes his eyes for several seconds, then worsens after eyes stay open for a short period of time. If asked to volitionally widen his eyes as much as possible, this appears normal for a few seconds then rapidly fatigues with eyes becoming ptotic again. Of note the ptosis is worse to the right eye secondary to prior surgery.  With prolonged vertical gaze both eyes also show progressive ptosis. Ice pack test administered to left and then right eyelid was inconclusive.  V: Facial sensation is symmetric to temperature VII: Facial movement is symmetric. However, when asked to smile a "myasthenic snarl" (aka "transverse smile") is  noted.   VIII: hearing is intact to voice X: Palate elevates symmetrically XI: Shoulder shrug is symmetric. XII: Tongue is midline without atrophy or fasciculations.  Motor: Tone is normal. Bulk is normal. 5/5 strength was present in all four extremities.  However, after repetitive motion with right arm it is noticeable that it becomes weaker to a 4/5.  In addition initially when arms are held out in front of patient they are a 5/5 however, after 1 minute clearly a 4-/5 at deltoids No drift No asterixis Sensory: Sensation is symmetric to light touch and temperature in the arms and legs. Deep Tendon Reflexes: 2+ and symmetric in the biceps and patellae.  Plantars: Toes are downgoing bilaterally.  Cerebellar: FNF and HKS are intact bilaterally Gait: Patient is able to stand with his own power. While ambulating he exhibited an unsteady gait that was nevertheless narrow based with good turns. Has a slightly stooped posture. No neck extensor weakness was noted.  Labs I have reviewed labs in epic and the results pertinent to this consultation are:   CBC    Component Value Date/Time   WBC 8.7 05/13/2020 0644   RBC 4.24 05/13/2020 0644   HGB 13.5 05/13/2020 0644   HCT 41.6 05/13/2020 0644   PLT 162  05/13/2020 0644   MCV 98.1 05/13/2020 0644   MCH 31.8 05/13/2020 0644   MCHC 32.5 05/13/2020 0644   RDW 14.2 05/13/2020 0644   LYMPHSABS 0.8 05/13/2020 0644   MONOABS 1.0 05/13/2020 0644   EOSABS 0.4 05/13/2020 0644   BASOSABS 0.0 05/13/2020 0644    CMP     Component Value Date/Time   NA 136 05/13/2020 0644   NA 141 08/11/2019 1527   K 3.9 05/13/2020 0644   CL 99 05/13/2020 0644   CO2 21 (L) 05/13/2020 0644   GLUCOSE 86 05/13/2020 0644   BUN 20 05/13/2020 0644   BUN 22 08/11/2019 1527   CREATININE 1.12 05/13/2020 0644   CALCIUM 9.2 05/13/2020 0644   PROT 7.2 05/12/2020 1246   ALBUMIN 3.5 05/12/2020 1246   AST 28 05/12/2020 1246   ALT 25 05/12/2020 1246   ALKPHOS 89  05/12/2020 1246   BILITOT 0.9 05/12/2020 1246   GFRNONAA 59 (L) 05/13/2020 0644   GFRAA >60 05/13/2020 0644    Lipid Panel     Component Value Date/Time   CHOL 138 05/13/2020 0644   CHOL 149 07/18/2019 1252   TRIG 77 05/13/2020 0644   HDL 53 05/13/2020 0644   HDL 65 07/18/2019 1252   CHOLHDL 2.6 05/13/2020 0644   VLDL 15 05/13/2020 0644   LDLCALC 70 05/13/2020 0644   LDLCALC 70 07/18/2019 1252     Imaging I have reviewed the images obtained:  CT-scan of the brain-no evidence of acute intracranial injury.  No acute facial fracture.  MRI examination of the brain-unable to obtain secondary to pacemaker  Etta Quill PA-C [facility] [phone] 05/13/2020, 1:41 PM     Assessment: 86 year old male with progressive weakness over the past 10 days following about 2 weeks of intermittent coughing at night. The weakness affects neck extensors, all 4 limbs, with dysarthric/dysphonic speech, difficulty swallowing and bilateral ptosis. Patient admits that his symptoms are worsened by repetitive movements, are minimal to non-existent when first getting up in the morning and progressively worsen throughout the day. Symptoms do seem to improve with rest. His weakness resulted in a recent fall from which he had significant difficulty getting back up.   1. On exam he has significant dysarthria and dysphonia, bilateral ptosis that is fatiguable, limb weakness that is fatiguable and an unsteady gait. 2. Given history and findings there is a high suspicion for myasthenia gravis.  We have low suspicion for this being due to stroke as the patient does not have any lateralizing signs or symptoms. 3. Lambert-Eaton myasthenic syndrome also possible. Will obtain an anti-voltage gated calcium channel antibody titer (anti-VGCC) 4. Although his CK level is not elevated, a statin-induced myopathy is possible.  5. An inflammatory myositis is unlikely given normal CK and no muscle pain.    Recommendations: - Obtain acetylcholine binding, blocking and modulating titers - May do a trial of Mestinon tomorrow.  -- If myasthenia panel comes back positive, would consider treatment with a 5 day course of IVIG - Will likely need a EMG with repetitive stimulation as well as a singer fiber EMG study (the latter is more sensitive and specific for MG). nerve conduction with single fiber stimulation -- Discontinue atorvastatin.  -- Respiratory therapy to obtain q12h NIF and FVC. Please document in the progress notes section of the chart.  -- Avoid medications known to worsen myasthenia gravis, including benzodiazepines. Would hold temazepam for now.  -- Anti-VGCC ab titer has been ordered (send out to [employer] [facility]).  I have seen and examined the patient. I have formulated the assessment and recommendations. 86 year old male with possible myasthenia gravis. Exam as above, with fatiguable weakness. Obtaining MG panel and additional labs as above.  Electronically signed: Dr. Kerney Elbe

## 2020-05-13 NOTE — Evaluation (Signed)
Speech Language Pathology Evaluation Patient Details Name: [name] MRN: [id number] DOB: 03-Aug-1931 Today's Date: 05/13/2020 Time: 9407-6808 SLP Time Calculation (min) (ACUTE ONLY): 25 min  Problem List:  Patient Active Problem List   Diagnosis Date Noted  . Stroke-like symptom 05/12/2020  . Leukocytosis 05/12/2020  . Macrocytosis 05/12/2020  . Stroke-like symptoms 05/12/2020  . Closed fracture of right distal humerus 07/03/2019  . Carotid artery disease ([place]) 09/09/2018  . Pacemaker-[employer] 07/03/2012  . Bruit 09/28/2011  . Tongue lesion 08/15/2011  . HYPERTENSION, BENIGN 06/23/2010  . Hyperlipidemia LDL goal <70 09/22/2008  . CAD, NATIVE VESSEL 09/22/2008  . Edema 09/22/2008  . SSS (sick sinus syndrome) ([place]) 09/22/2008   Past Medical History:  Past Medical History:  Diagnosis Date  . Carotid artery disease ([place])   . Coronary artery disease    PCI RCA 2009  . Dyslipidemia   . Hypertension   . Scleritis, unspecified    treated with prednisone  . Seizure [facility])    felt due to cyclosporin in setting of electrolyte disorder  . Solitary kidney   . Symptomatic bradycardia    a. s/p STJ dual chamber pacemaker   Past Surgical History:  Past Surgical History:  Procedure Laterality Date  . CARDIAC CATHETERIZATION     12 years ago?  . COLONOSCOPY    . EYE SURGERY     right eye about 10 years ago  . ORIF HUMERUS FRACTURE Right 07/03/2019   Procedure: Right distal humerus open reduction, internal fixation;  Surgeon: Verner Mould, MD;  Location: [place];  Service: Orthopedics;  Laterality: Right;  . PACEMAKER INSERTION  2009   STJ dual chamber pacemaker implanted by Dr Olevia Perches for symptomatic bradycardia   HPI:  [name] is a 86 y.o. male with medical history significant of hypertension, BPH, hyperlipidemia, coronary artery disease status post stent placement, symptomatic bradycardia status post pacemaker placement presents to emergency department with  dysphagia, slurred speech, generalized weakness over past 7-10 days.   Assessment / Plan / Recommendation Clinical Impression  [name] demonstrates a mild dysarthria with co-occurring stroke-like symptoms. Pt with generalized weakness over past week. Daughter present reporting his speech sounds quite different from normal, but is somewhat better now than it has been. Pt was fully intelligible with only a very mild R sided facial weakness/droop. Pt reports that this is attributed to a past eye surgery, but daughter feels it is at least exacerbated from baseline. He had imprecision, noted most prominent with fricatives and affricates, suspected to be related to reduced lingual strength/coordination. Diadokokinetic rate was reduced at 3 repetitions in 5 seconds. He participated in cognitive testing as well, which revealed functional cognition, quite remarkable for advanced age. Pt was WNL in attention, memory, calculations, reasoning, medication management, and repetition. He appears to be cognitively able to continue handling IADL tasks, however, mobility/safety may be limiting (see PT/OT notes for recommendations). ST to follow x1 for dysarthria. Further treatment for dysphagia to be determined after MBS.    SLP Assessment  SLP Recommendation/Assessment: Patient needs continued Speech Lanaguage Pathology Services SLP Visit Diagnosis: Dysphagia, unspecified (R13.10);Dysarthria and anarthria (R47.1)    Follow Up Recommendations  None    Frequency and Duration min 2x/week  1 week      SLP Evaluation Cognition  Orientation Level: Oriented X4       Comprehension  Auditory Comprehension Overall Auditory Comprehension: Appears within functional limits for tasks assessed Reading Comprehension Reading Status: Within funtional limits  Expression Expression Primary Mode of Expression: Verbal Verbal Expression Overall Verbal Expression: Appears within functional limits for tasks assessed    Oral / Motor  Oral Motor/Sensory Function Overall Oral Motor/Sensory Function: Mild impairment Facial ROM: Reduced right Facial Symmetry: Abnormal symmetry right Motor Speech Overall Motor Speech: Impaired Respiration: Within functional limits Phonation: Normal Resonance: Within functional limits Articulation: Impaired Level of Impairment: Conversation Intelligibility: Intelligible Motor Planning: Witnin functional limits Motor Speech Errors: Not applicable                      [name] P. [name], M.S., [place] Pathologist Acute Rehabilitation Services Pager: [place] 05/13/2020, 12:15 PM

## 2020-05-13 NOTE — Plan of Care (Signed)
  Problem: Education: Goal: Knowledge of disease or condition will improve Outcome: Progressing Goal: Knowledge of secondary prevention will improve Outcome: Progressing Goal: Knowledge of patient specific risk factors addressed and post discharge goals established will improve Outcome: Progressing   Problem: Coping: Goal: Will verbalize positive feelings about self Outcome: Progressing Goal: Will identify appropriate support needs Outcome: Progressing   Problem: Health Behavior/Discharge Planning: Goal: Ability to manage health-related needs will improve Outcome: Progressing   Problem: Self-Care: Goal: Ability to participate in self-care as condition permits will improve Outcome: Progressing Goal: Verbalization of feelings and concerns over difficulty with self-care will improve Outcome: Progressing Goal: Ability to communicate needs accurately will improve Outcome: Progressing   Problem: Nutrition: Goal: Risk of aspiration will decrease Outcome: Progressing Goal: Dietary intake will improve Outcome: Progressing   Problem: Ischemic Stroke/TIA Tissue Perfusion: Goal: Complications of ischemic stroke/TIA will be minimized Outcome: Progressing

## 2020-05-14 ENCOUNTER — Ambulatory Visit: Payer: Medicare Other | Admitting: Neurology

## 2020-05-14 LAB — BASIC METABOLIC PANEL
Anion gap: 9 (ref 5–15)
BUN: 23 mg/dL (ref 8–23)
CO2: 26 mmol/L (ref 22–32)
Calcium: 9.1 mg/dL (ref 8.9–10.3)
Chloride: 100 mmol/L (ref 98–111)
Creatinine, Ser: 1.04 mg/dL (ref 0.61–1.24)
GFR calc Af Amer: >60 mL/min (ref >60)
GFR calc non Af Amer: >60 mL/min (ref >60)
Glucose, Bld: 106 mg/dL — ABNORMAL HIGH (ref 70–99)
Potassium: 3.6 mmol/L (ref 3.5–5.1)
Sodium: 135 mmol/L (ref 135–145)

## 2020-05-14 MED ORDER — PYRIDOSTIGMINE BROMIDE 10 MG/2ML IV SOLN
2.0000 mg | INTRAVENOUS | Status: DC
  Filled 2020-05-14: qty 0.4

## 2020-05-14 MED ORDER — PYRIDOSTIGMINE BROMIDE 60 MG PO TABS
30.0000 mg | ORAL_TABLET | Freq: Once | ORAL | Status: AC
  Administered 2020-05-14: 30 mg via ORAL
  Filled 2020-05-14 (×2): qty 0.5

## 2020-05-14 MED ORDER — ATROPINE SULFATE 1 MG/10ML IJ SOSY
PREFILLED_SYRINGE | INTRAMUSCULAR | Status: AC
  Filled 2020-05-14: qty 10

## 2020-05-14 MED ORDER — PYRIDOSTIGMINE BROMIDE 60 MG PO TABS
30.0000 mg | ORAL_TABLET | ORAL | Status: AC
  Administered 2020-05-14: 30 mg via ORAL
  Filled 2020-05-14: qty 0.5

## 2020-05-14 NOTE — Progress Notes (Signed)
NIF: -30 VC: 1.9L with good pt effort.

## 2020-05-14 NOTE — Progress Notes (Signed)
Physical Therapy Treatment Patient Details Name: [name] MRN: [id number] DOB: 03-28-1931 Today's Date: 05/14/2020    History of Present Illness This 86 y.o. male admitted with dysphagia, slurred speech, generalized weakness, and fall.   CT of head showed no acute intracranial injury.  Work up underway.  PMH includes:  symptomatic bradycardia, solitary kidney, HTN, CAD, s/p ORIF of Rt humerus, s/p pacemaker     PT Comments    Patient received in bed, daughter present. He is cooperative and agrees to PT session. Speech more difficult to understand today compared to yesterday. He is mod independent with bed mobility, transfers with min guard and ambulated with rolling walker x 200 feet with min guard. His O2 saturations reading in mid 80%s with mobility on room air. He will continue to benefit from skilled PT while here to improve endurance and safety for return home at discharge.       Follow Up Recommendations  Home health PT;Supervision for mobility/OOB     Equipment Recommendations  None recommended by PT    Recommendations for Other Services       Precautions / Restrictions Precautions Precautions: Fall Restrictions Weight Bearing Restrictions: No    Mobility  Bed Mobility Overal bed mobility: Modified Independent             General bed mobility comments: bed rails, increased time  Transfers Overall transfer level: Needs assistance Equipment used: Rolling walker (2 wheeled) Transfers: Sit to/from Stand Sit to Stand: Min guard         General transfer comment: min guard for safety   Ambulation/Gait Ambulation/Gait assistance: Min guard Gait Distance (Feet): 200 Feet Assistive device: Rolling walker (2 wheeled) Gait Pattern/deviations: Step-through pattern;Trunk flexed Gait velocity: decr   General Gait Details: improved stability and balance with use of RW   Stairs             Wheelchair Mobility    Modified Rankin (Stroke Patients  Only)       Balance Overall balance assessment: Needs assistance Sitting-balance support: Feet supported Sitting balance-Leahy Scale: Good     Standing balance support: Bilateral upper extremity supported;During functional activity Standing balance-Leahy Scale: Fair Standing balance comment: reliant on UE support                            Cognition Arousal/Alertness: Awake/alert Behavior During Therapy: WFL for tasks assessed/performed Overall Cognitive Status: Within Functional Limits for tasks assessed                                 General Comments: patient's speech is more garbled and slurred today from yesterday      Exercises      General Comments        Pertinent Vitals/Pain Pain Assessment: No/denies pain    Home Living                      Prior Function            PT Goals (current goals can now be found in the care plan section) Acute Rehab PT Goals Patient Stated Goal: to improve balance and endurance  PT Goal Formulation: With patient Time For Goal Achievement: 05/27/20 Potential to Achieve Goals: Good Progress towards PT goals: Progressing toward goals    Frequency    Min 3X/week      PT Plan  Current plan remains appropriate    Co-evaluation              AM-PAC PT "6 Clicks" Mobility   Outcome Measure  Help needed turning from your back to your side while in a flat bed without using bedrails?: None Help needed moving from lying on your back to sitting on the side of a flat bed without using bedrails?: None Help needed moving to and from a bed to a chair (including a wheelchair)?: A Little Help needed standing up from a chair using your arms (e.g., wheelchair or bedside chair)?: A Little Help needed to walk in hospital room?: A Little Help needed climbing 3-5 steps with a railing? : A Little 6 Click Score: 20    End of Session Equipment Utilized During Treatment: Gait belt Activity  Tolerance: Patient tolerated treatment well Patient left: in bed;with bed alarm set;with call bell/phone within reach;with family/visitor present Nurse Communication: Mobility status PT Visit Diagnosis: Unsteadiness on feet (R26.81);Muscle weakness (generalized) (M62.81);Other abnormalities of gait and mobility (R26.89)     Time: 1425-1441 PT Time Calculation (min) (ACUTE ONLY): 16 min  Charges:  $Gait Training: 8-22 mins                     Jayleene Glaeser, PT, GCS 05/14/20,2:52 PM

## 2020-05-14 NOTE — Plan of Care (Signed)
  Problem: Coping: Goal: Will verbalize positive feelings about self Outcome: Progressing   Problem: Self-Care: Goal: Ability to participate in self-care as condition permits will improve Outcome: Progressing   Problem: Nutrition: Goal: Risk of aspiration will decrease Outcome: Progressing   

## 2020-05-14 NOTE — TOC Progression Note (Signed)
Transition of Care [facility]) - Progression Note    Patient Details  Name: [name] MRN: [id number] Date of Birth: 1931-01-21  Transition of Care [facility]) CM/SW [place], [place] Phone Number: 05/14/2020, 4:50 PM  Clinical Narrative:   CSW received call from patient's daughter Mardene Celeste to confirm that patient was not recommended for 24/7 supervision, as the daughters do not live local (hour and a half away). Patient not recommended for 24/7 support at this time. Patient also using his wife's old walker, would like a new one. CSW sent referral to [employer].     Expected Discharge Plan: [place] Barriers to Discharge: Continued Medical Work up  Expected Discharge Plan and Services Expected Discharge Plan: [place] Choice: [place] arrangements for the past 2 months: Apartment                           HH Arranged: PT, Nurse's Aide, Speech Therapy HH Agency: [facility] [place] Date [place]: 05/13/20   Representative spoke with at [place]: [place] ([place]) Interventions    Readmission Risk Interventions No flowsheet data found.

## 2020-05-14 NOTE — Progress Notes (Signed)
  Speech Language Pathology Treatment: Dysphagia;Cognitive-Linquistic  Patient Details Name: [name] MRN: [id number] DOB: Nov 19, 1930 Today's Date: 05/14/2020 Time: 7121-9758 SLP Time Calculation (min) (ACUTE ONLY): 20 min  Assessment / Plan / Recommendation Clinical Impression  Pt was seen for skilled ST targeting goals for dysphagia and communication.  Upon arrival, pt was awake, alert, and pleasantly interactive.  He could recall specific details related to yesterday's MBS with mod I.  SLP supplemented with additional information to ensure pt had a complete picture of his swallowing function.  SLP also discussed ways in which he can gradually incorporate advanced solids into his diet as he feels ready, including mincing solids and using condiments or sauces to moisten.  Pt verbalized understanding and even offered his own appropriate ideas for diet progression.  Pt consumed purees and thin liquids with mod I use of swallowing precautions and intermittent delayed coughing which he stated was volitional per previous SLP's recommendations.  SLP discussed that given results of yesterday's MBS pt is likely a functional aspirator and provided education regarding s/s of developing infectious process such as shortness of breath accompanied by increased chest congestion and fever and instructed pt to seek medical help quickly should he experience any of those symptoms post d/c.  SLP also stated that nectar thick liquids can be used if needed in the event of acute respiratory decompensation but that it does not appear indicated at this time.  During conversations with SLP, pt needed min verbal cues to slow rate and overarticulate to achieve intelligibility at the conversational level.  Pt would benefit from skilled education regarding compensatory intelligibility strategies at next available appointment; however, this was not specifically addressed today to allow for focus on dysphagia education.  Pt was left  in bed with bed alarm set and call bell within reach.     HPI HPI: [name] is a 86 y.o. male with medical history significant of hypertension, BPH, hyperlipidemia, coronary artery disease status post stent placement, symptomatic bradycardia status post pacemaker placement presents to emergency department with dysphagia, slurred speech, generalized weakness over past 7-10 days.      SLP Plan          Recommendations  Diet recommendations: Dysphagia 1 (puree);Thin liquid Liquids provided via: Cup;Straw Medication Administration: Crushed with puree Supervision: Intermittent supervision to cue for compensatory strategies;Patient able to self feed Compensations: Slow rate;Follow solids with liquid;Small sips/bites Postural Changes and/or Swallow Maneuvers: Seated upright 90 degrees;Upright 30-60 min after meal                Oral Care Recommendations: Oral care BID Follow up Recommendations: Outpatient SLP SLP Visit Diagnosis: Dysphagia, unspecified (R13.10);Dysarthria and anarthria (R47.1)       GO                Kimberlie Csaszar, Selinda Orion 05/14/2020, 10:20 AM

## 2020-05-14 NOTE — Progress Notes (Addendum)
NEUROLOGY PROGRESS NOTE  Subjective:  Patient has no complaints at this time. Initially when I walked in I noted that his speech was much better and he agreed. He clearly stated that his speech gets worse as time goes on. When I came back into the room about 30 minutes later, I did mention that his speech seems worse and he agreed.  Exam: Vitals:   05/14/20 0303 05/14/20 0800  BP: (!) 161/74 131/78  Pulse: 62 (!) 59  Resp: 18 18  Temp: 98 F (36.7 C) 98.1 F (36.7 C)  SpO2: 94% 96%    Neuro:  Mental Status: Patient is awake, alert, oriented to person, place, month, year, and situation. Speech-is initially only slightly dysarthric however later after he talked for a prolonged period of time notably more dysarthric. Has no difficulty with naming, repeating or comprehension.  Patient is able to follow all commands asked of him. Patient is able to give a coherent history. Cranial Nerves: II: Visual Fields are full. PERRL. III,IV, VI: EOMI without diplopia. Patient does have bilateral ptosis that waxes and wanes during the exam. It gets better after he closes his eyes for several seconds, then worsens after eyes stay open for a short period of time. If asked to volitionally widen his eyes as much as possible, this appears normal for a few seconds then rapidly fatigues with eyes becoming ptotic again. Of note the ptosis is worse to the right eye secondary to prior surgery.  With prolonged vertical gaze both eyes also show progressive ptosis. Ice pack test administered to left and then right eyelid was inconclusive.  V: Facial sensation is symmetric to temperature VII: Facial movement is symmetric. However, when asked to smile a "myasthenic snarl" (aka "transverse smile") is noted.   VIII: hearing is intact to voice XI: Shoulder shrug is symmetric. XII:  Tongue is midline Motor: Tone is normal. Bulk is normal. 5/5 strength was present in all four extremities.  However, after repetitive motion  with right arm it is noticeable that it becomes weaker to a 4/5.  In addition initially when arms are held out in front of patient they are a 5/5 however, after 1 minute clearly a 4-/5 at deltoids-as prior No drift No asterixis Sensory: Sensation is grossly intact Deep Tendon Reflexes: 2+ and symmetric in the biceps and patella Plantars: Toes are downgoing bilaterally.  Cerebellar: FNF and HKS are intact bilaterally    Medications:  Scheduled: . aspirin EC  81 mg Oral Daily  . atropine      . carvedilol  3.125 mg Oral BID  . chlorhexidine  15 mL Mouth Rinse BID  . clopidogrel  75 mg Oral Daily  . enoxaparin (LOVENOX) injection  40 mg Subcutaneous Q24H  . feeding supplement (ENSURE ENLIVE)  237 mL Oral BID BM  . finasteride  5 mg Oral Daily  . mouth rinse  15 mL Mouth Rinse q12n4p  . multivitamin with minerals  1 tablet Oral Daily  . pyridostigmine  30 mg Oral Once  . spironolactone  12.5 mg Oral Daily   Continuous:   Pertinent Labs/Diagnostics:  Vital capacity was 0.9 L yesterday Negative inspiratory force was -10 yesterday however patient is in no distress.  CT Head Wo Contrast Result Date: 05/12/2020 CLINICAL DATA:  Fall EXAM: CT HEAD WITHOUT CONTRAST CT MAXILLOFACIAL WITHOUT CONTRAST TECHNIQUE: Multidetector CT imaging of the head and maxillofacial structures were performed using the standard protocol without intravenous contrast. Multiplanar CT image reconstructions of the maxillofacial structures  were also generated. COMPARISON:  None. FINDINGS: CT HEAD FINDINGS Brain: There is no acute intracranial hemorrhage, mass effect, or edema. Gray-white differentiation is preserved. Prominence of the ventricles and sulci reflects mild generalized parenchymal volume loss. Patchy hypoattenuation in the supratentorial white matter is nonspecific but may reflect mild chronic microvascular ischemic changes. Vascular: Intracranial atherosclerotic calcification at the skull base. Skull:  Intact. Other: Mastoid air cells are clear. CT MAXILLOFACIAL FINDINGS Osseous: No acute facial fracture. Degenerative changes of the left temporomandibular joint. Degenerative changes of the visualized cervical spine. Orbits: No intraorbital hematoma. Sinuses: Mild mucosal thickening. Soft tissues: Unremarkable. IMPRESSION: No evidence of acute intracranial injury. No acute facial fracture. Chronic/nonemergent findings detailed above. Electronically Signed   By: Macy Mis M.D.   On: 05/12/2020 15:57    Etta Quill PA-C [facility] [phone]  Assessment: 86 year old male with progressive weakness over the past 10 days following about 2 weeks of intermittent coughing at night. The weakness affects neck extensors, all 4 limbs, with dysarthric/dysphonic speech, difficulty swallowing and bilateral ptosis. Patient admits that his symptoms are worsened by repetitive movements, are minimal to non-existent when first getting up in the morning and progressively worsen throughout the day. Symptoms do seem to improve with rest. His weakness resulted in a recent fall from which he had significant difficulty getting back up.   1. On exam he has significant dysarthria and dysphonia, bilateral ptosis that is fatiguable, limb weakness that is fatiguable and an unsteady gait. 2. Given history and findings there is a high suspicion for myasthenia gravis.  3. Lambert-Eaton myasthenic syndrome also possible. We have ordered an anti-voltage gated calcium channel antibody titer (anti-VGCC) 4. Although his CK level is not elevated, a statin-induced myopathy is possible.  5. An inflammatory myositis is unlikely given normal CK and no muscle pain.   Recommendations: - Obtain acetylcholine binding, blocking and modulating titers - Will do a trial of oral Mestinon 30 mg today. Will not use the IV form as his pulse is already bradycardic at 66.  -- If myasthenia panel comes back positive, would consider  treatment with a 5 day course of IVIG - Will likely need a EMG with repetitive stimulation as well as a singer fiber EMG study (the latter is more sensitive and specific for MG). nerve conduction with single fiber stimulation -- Discontinue atorvastatin.  -- Respiratory therapy to obtain q12h NIF and FVC. Please document in the progress notes section of the chart.  -- Avoid medications known to worsen myasthenia gravis, including benzodiazepines. Would hold temazepam for now.  -- Anti-VGCC ab titer has been ordered (send out to [facility] [employer] [facility]).   Addendum:  -- Patient did receive 60 mg of Mestinon in two separate 30 mg oral doses.  Patient did notice a significant difference in his swallowing and speech about 1.5 hours after each dose. In addition, daughter also noted a significant difference in his eyelid drooping.   -- Above response is consistent with a NMJ cholinergic transmission deficit in multiple muscle groups, symmetrically. Epidemiologically, myasthenia gravis would be the most likely etiollogy -- At this point in time, the patient and his daughter are going to talk about either having IVIG daily for 5 days here in the hospital or having an outpatient neurology office visit and then possibly getting IVIG as an outpatient.  Electronically signed: Dr. Kerney Elbe 05/14/2020, 11:35 AM

## 2020-05-14 NOTE — Progress Notes (Signed)
PROGRESS NOTE    [name]  YQM:[id number] DOB: July 19, 1931 DOA: 05/12/2020 PCP: Shirline Frees, MD   Brief Narrative: 86 year old with past medical history significant for hypertension, BPH, hyperlipidemia, CAD status post a stent, symptomatic bradycardia status post pacemaker, presented to the emergency department complaining of dysphagia, slurry speech, weakness, balance problem and fall. He report cough was one month. Daughter noticed that he has not been able to hold his head up. CT head negative for acute finding. Unable to do MRI, due to pacemaker, not safe.     Assessment & Plan:   Principal Problem:   Stroke-like symptom Active Problems:   Hyperlipidemia LDL goal <70   HYPERTENSION, BENIGN   SSS (sick sinus syndrome) (HCC)   Pacemaker-St.Jude   Leukocytosis   Macrocytosis   Stroke-like symptoms  1-Dysphagia, slurred speech, gait problems, weakness; fall: neurology considering Myasthenia Gravis.  CT head negative.  Oxygen on ambulation above 90.  No evidence of infection.  Pacemaker is incompatible, not safe to perform MRI.  Neurology consulted.  Speech swallow evaluation. Recommended puree diet.  B 12, TSH normal. CK normal.  Medications effect? Levocetirizine, temazepam. ?  Acetylcholine receptor Ab pending.  Neurology order anti-voltage gated calcium channel antibody.   2-HTN;  Hold coreg, incase neurology decide to do mestinon trial.  Resume spironolactone 8/27  3-Leukocytosis: Reactive. Chest x-ray negative for pneumonia. Resolved  4-Macrocytosis: B28 normal Folic acid Normal.  CKD stage II: Is stable  BPH: Resume Proscar Carotid artery stenosis: Continue with aspirin statin and Plavix      Nutrition Problem: Inadequate oral intake Etiology: dysphagia    Signs/Symptoms: per patient/family report, other (comment) (dysphagia 1 diet)    Interventions: Ensure Enlive (each supplement provides 350kcal and 20 grams of protein),  MVI  Estimated body mass index is 23.29 kg/m as calculated from the following:   Height as of this encounter: 5\' 11"  (1.803 m).   Weight as of this encounter: 75.8 kg.   DVT prophylaxis: Lovenox Code Status: Full code Family Communication: Care discussed with daughter who was at bedside Disposition Plan:  Status is: Inpatient  Remains inpatient appropriate because:Ongoing diagnostic testing needed not appropriate for outpatient work up   Dispo: The patient is from: Home              Anticipated d/c is to: Home              Anticipated d/c date is: 2 days              Patient currently is not medically stable to d/c.        Consultants:   Neurology  Procedures:   None  Antimicrobials:    Subjective: He was able to sleep well last night. Speech therapist recommended puree diet, and if food get lodge throat to try to cough out .  Speech and swallowing problem continue,.  Objective: Vitals:   05/13/20 2309 05/14/20 0303 05/14/20 0800 05/14/20 1140  BP: 117/79 (!) 161/74 131/78 (!) 129/58  Pulse: 61 62 (!) 59 62  Resp: 16 18 18 19   Temp: 98.2 F (36.8 C) 98 F (36.7 C) 98.1 F (36.7 C) 98 F (36.7 C)  TempSrc:   Oral Oral  SpO2: 97% 94% 96% 95%  Weight:      Height:        Intake/Output Summary (Last 24 hours) at 05/14/2020 1159 Last data filed at 05/14/2020 0300 Gross per 24 hour  Intake 30 ml  Output 500 ml  Net -470 ml   Filed Weights   05/12/20 1800 05/13/20 0343  Weight: 81.6 kg 75.8 kg    Examination:  General exam: NAD Respiratory system: Normal respiratory effort Cardiovascular system: Bradycardic Central nervous system: alert, answer questions, speech slurred at time.  Extremities: no edema   Data Reviewed: I have personally reviewed following labs and imaging studies  CBC: Recent Labs  Lab 05/12/20 1246 05/12/20 1311 05/12/20 1822 05/13/20 0644  WBC 14.2*  --  11.2* 8.7  NEUTROABS 11.8*  --   --  6.3  HGB 14.2 13.9 14.5  13.5  HCT 43.7 41.0 43.8 41.6  MCV 101.4*  --  98.4 98.1  PLT 176  --  188 407   Basic Metabolic Panel: Recent Labs  Lab 05/12/20 1246 05/12/20 1311 05/12/20 1822 05/13/20 0644 05/14/20 0816  NA 137 136  --  136 135  K 3.7 3.7  --  3.9 3.6  CL 100 101  --  99 100  CO2 26  --   --  21* 26  GLUCOSE 104* 101*  --  86 106*  BUN 19 21  --  20 23  CREATININE 1.12 1.00 0.92 1.12 1.04  CALCIUM 9.5  --   --  9.2 9.1   GFR: Estimated Creatinine Clearance: 53.3 mL/min (by C-G formula based on SCr of 1.04 mg/dL). Liver Function Tests: Recent Labs  Lab 05/12/20 1246  AST 28  ALT 25  ALKPHOS 89  BILITOT 0.9  PROT 7.2  ALBUMIN 3.5   No results for input(s): LIPASE, AMYLASE in the last 168 hours. No results for input(s): AMMONIA in the last 168 hours. Coagulation Profile: Recent Labs  Lab 05/12/20 1246  INR 1.2   Cardiac Enzymes: Recent Labs  Lab 05/12/20 1822  CKTOTAL 87   BNP (last 3 results) Recent Labs    07/18/19 1252  PROBNP 156   HbA1C: Recent Labs    05/13/20 0644  HGBA1C 6.1*   CBG: No results for input(s): GLUCAP in the last 168 hours. Lipid Profile: Recent Labs    05/13/20 0644  CHOL 138  HDL 53  LDLCALC 70  TRIG 77  CHOLHDL 2.6   Thyroid Function Tests: Recent Labs    05/12/20 1822  TSH 1.734   Anemia Panel: Recent Labs    05/12/20 1822  VITAMINB12 956*  FOLATE 44.4   Sepsis Labs: No results for input(s): PROCALCITON, LATICACIDVEN in the last 168 hours.  Recent Results (from the past 240 hour(s))  SARS Coronavirus 2 by RT PCR (hospital order, performed in [facility] hospital lab) Nasopharyngeal Nasopharyngeal Swab     Status: None   Collection Time: 05/12/20  5:48 PM   Specimen: Nasopharyngeal Swab  Result Value Ref Range Status   SARS Coronavirus 2 NEGATIVE NEGATIVE Final    Comment: (NOTE) SARS-CoV-2 target nucleic acids are NOT DETECTED.  The SARS-CoV-2 RNA is generally detectable in upper and lower respiratory  specimens during the acute phase of infection. The lowest concentration of SARS-CoV-2 viral copies this assay can detect is 250 copies / mL. A negative result does not preclude SARS-CoV-2 infection and should not be used as the sole basis for treatment or other patient management decisions.  A negative result may occur with improper specimen collection / handling, submission of specimen other than nasopharyngeal swab, presence of viral mutation(s) within the areas targeted by this assay, and inadequate number of viral copies (<250 copies / mL). A negative result must be combined with clinical observations,  patient history, and epidemiological information.  Fact Sheet for Patients:   StrictlyIdeas.no  Fact Sheet for Healthcare Providers: BankingDealers.co.za  This test is not yet approved or  cleared by the [place] FDA and has been authorized for detection and/or diagnosis of SARS-CoV-2 by FDA under an Emergency Use Authorization (EUA).  This EUA will remain in effect (meaning this test can be used) for the duration of the COVID-19 declaration under Section 564(b)(1) of the Act, 21 U.S.C. section 360bbb-3(b)(1), unless the authorization is terminated or revoked sooner.  Performed at [facility], [address], [place],  [address]          Radiology Studies: DG Chest 2 View  Result Date: 05/12/2020 CLINICAL DATA:  Recent fall today with cough and dysphagia, initial encounter EXAM: CHEST - 2 VIEW COMPARISON:  04/09/2020 FINDINGS: Cardiac shadow is stable. Pacing device is again seen. Aortic calcifications are again noted. Mild chronic interstitial changes are again seen stable from the prior study. No focal infiltrate or effusion is seen. No bony abnormality is noted. IMPRESSION: Chronic interstitial changes without acute abnormality. Electronically Signed   By: Inez Catalina M.D.   On: 05/12/2020 18:50   DG  Cervical Spine 2 or 3 views  Result Date: 05/13/2020 CLINICAL DATA:  Neck pain after falling yesterday EXAM: CERVICAL SPINE - 2-3 VIEW COMPARISON:  None FINDINGS: Osseous demineralization. Prevertebral soft tissues normal thickness. Vertebral body heights maintained without fracture or subluxation. Multilevel disc space narrowing and endplate spur formation. Multilevel facet degenerative changes. Biapical lung scarring. Atherosclerotic calcification aorta. LEFT subclavian pacemaker. IMPRESSION: Degenerative disc and facet disease changes cervical spine. No acute abnormalities. Electronically Signed   By: Lavonia Dana M.D.   On: 05/13/2020 16:49   CT Head Wo Contrast  Result Date: 05/12/2020 CLINICAL DATA:  Fall EXAM: CT HEAD WITHOUT CONTRAST CT MAXILLOFACIAL WITHOUT CONTRAST TECHNIQUE: Multidetector CT imaging of the head and maxillofacial structures were performed using the standard protocol without intravenous contrast. Multiplanar CT image reconstructions of the maxillofacial structures were also generated. COMPARISON:  None. FINDINGS: CT HEAD FINDINGS Brain: There is no acute intracranial hemorrhage, mass effect, or edema. Gray-white differentiation is preserved. Prominence of the ventricles and sulci reflects mild generalized parenchymal volume loss. Patchy hypoattenuation in the supratentorial white matter is nonspecific but may reflect mild chronic microvascular ischemic changes. Vascular: Intracranial atherosclerotic calcification at the skull base. Skull: Intact. Other: Mastoid air cells are clear. CT MAXILLOFACIAL FINDINGS Osseous: No acute facial fracture. Degenerative changes of the left temporomandibular joint. Degenerative changes of the visualized cervical spine. Orbits: No intraorbital hematoma. Sinuses: Mild mucosal thickening. Soft tissues: Unremarkable. IMPRESSION: No evidence of acute intracranial injury. No acute facial fracture. Chronic/nonemergent findings detailed above. Electronically  Signed   By: Macy Mis M.D.   On: 05/12/2020 15:57   DG Swallowing Func-Speech Pathology  Result Date: 05/13/2020 Objective Swallowing Evaluation: Type of Study: Bedside Swallow Evaluation  Patient Details Name: PARTICK MUSSELMAN MRN: [id number] Date of Birth: 1931/04/16 Today's Date: 05/13/2020 Time: SLP Start Time (ACUTE ONLY): 1400 -SLP Stop Time (ACUTE ONLY): 1425 SLP Time Calculation (min) (ACUTE ONLY): 25 min Past Medical History: Past Medical History: Diagnosis Date . Carotid artery disease ([place])  . Coronary artery disease   PCI RCA 2009 . Dyslipidemia  . Hypertension  . Scleritis, unspecified   treated with prednisone . Seizure [facility])   felt due to cyclosporin in setting of electrolyte disorder . Solitary kidney  . Symptomatic bradycardia   a. s/p STJ  dual chamber pacemaker Past Surgical History: Past Surgical History: Procedure Laterality Date . CARDIAC CATHETERIZATION    12 years ago? . COLONOSCOPY   . EYE SURGERY    right eye about 10 years ago . ORIF HUMERUS FRACTURE Right 07/03/2019  Procedure: Right distal humerus open reduction, internal fixation;  Surgeon: Verner Mould, MD;  Location: [facility];  Service: Orthopedics;  Laterality: Right; . PACEMAKER INSERTION  2009  STJ dual chamber pacemaker implanted by Dr Olevia Perches for symptomatic bradycardia HPI: AHRON HULBERT is a 86 y.o. male with medical history significant of hypertension, BPH, hyperlipidemia, coronary artery disease status post stent placement, symptomatic bradycardia status post pacemaker placement presents to emergency department with dysphagia, slurred speech, generalized weakness over past 7-10 days.  Subjective: Pt and daughter, Shirlean Mylar, confirm worsening dysphagia and dysarthria over the past 7-10 days. Assessment / Plan / Recommendation CHL IP CLINICAL IMPRESSIONS 05/13/2020 Clinical Impression Mr. Vandergriff demonstrates a moderate pharyngeal dysphagia marked by anatomical difference of significantly curved epiglottis toward hyoid,  collecting severe vallecular residue. He had reduced hyoid excursion, but fair laryngeal elevation. Reduced hyoid excursion and base of tongue retraction resulted in reduced UES opening and subsequent moderate residue in pyriform sinus/just above UES. Pt is sensate to residue and attempts multiple swallows and liquid wash, but has limited success. Strategies of chin tuck, head turn L, head turn R, and effortful swallow were all ineffective. He was noted with single instance of trace and transient aspiration with thin liquids and consistent transient deep penetration of thin liquids. Nectar thick liquids were trialed, however, given increased residue with increased viscosity, this is not an effective choice for him at this time. Nectar thick liquids did resolve the aspiration, so should he present with respiratory distress or infection, this would be a good option. At this time, pt appears to be a functional aspirator by clearing aspiration with a spontaneous cough as well as using diligent oral care to prevent infection. Although residue was generally equivalent between solids and purees, pt feels that purees "go down easier," so recommend pureed consistency at his request. May upgrade as he wishes. Recommend: thin liquids in single sips, dysphagia 1 solids (purees), alternate liquids/solids, intensive dysphagia treatment while on acute and on outpatient level SLP Visit Diagnosis Dysphagia, unspecified (R13.10);Dysarthria and anarthria (R47.1) Impact on safety and function Mild aspiration risk   No flowsheet data found.  Prognosis 05/13/2020 Prognosis for Safe Diet Advancement Good Barriers to Reach Goals Severity of deficits Barriers/Prognosis Comment -- CHL IP DIET RECOMMENDATION 05/13/2020 SLP Diet Recommendations Dysphagia 1 (Puree) solids;Thin liquid Liquid Administration via Straw;Cup Medication Administration -- Compensations Slow rate;Follow solids with liquid;Small sips/bites Postural Changes Remain  semi-upright after after feeds/meals (Comment);Seated upright at 90 degrees   CHL IP OTHER RECOMMENDATIONS 05/13/2020 Recommended Consults -- Oral Care Recommendations Oral care BID;Patient independent with oral care Other Recommendations --   CHL IP FOLLOW UP RECOMMENDATIONS 05/13/2020 Follow up Recommendations Outpatient SLP   CHL IP FREQUENCY AND DURATION 05/13/2020 Speech Therapy Frequency (ACUTE ONLY) min 2x/week Treatment Duration 2 weeks      CHL IP ORAL PHASE 05/13/2020 Oral Phase WFL  CHL IP PHARYNGEAL PHASE 05/13/2020 Pharyngeal Phase Impaired Pharyngeal- Thin Straw Reduced epiglottic inversion;Reduced anterior laryngeal mobility;Penetration/Aspiration before swallow;Trace aspiration;Pharyngeal residue - valleculae Pharyngeal Material enters airway, passes BELOW cords then ejected out Pharyngeal- Puree Reduced tongue base retraction;Reduced epiglottic inversion;Reduced anterior laryngeal mobility;Pharyngeal residue - valleculae Pharyngeal- Regular Reduced epiglottic inversion;Reduced tongue base retraction;Pharyngeal residue - valleculae  CHL IP CERVICAL  ESOPHAGEAL PHASE 05/13/2020 Cervical Esophageal Phase Impaired Thin Straw Reduced cricopharyngeal relaxation Puree Reduced cricopharyngeal relaxation Mechanical Soft -- Regular Reduced cricopharyngeal relaxation Madison P. Isenhour, M.S., CCC-SLP Speech-Language Pathologist Acute Rehabilitation Services Pager: [place] 05/13/2020, 3:37 PM              CT Maxillofacial Wo Contrast  Result Date: 05/12/2020 CLINICAL DATA:  Fall EXAM: CT HEAD WITHOUT CONTRAST CT MAXILLOFACIAL WITHOUT CONTRAST TECHNIQUE: Multidetector CT imaging of the head and maxillofacial structures were performed using the standard protocol without intravenous contrast. Multiplanar CT image reconstructions of the maxillofacial structures were also generated. COMPARISON:  None. FINDINGS: CT HEAD FINDINGS Brain: There is no acute intracranial hemorrhage, mass effect, or  edema. Gray-white differentiation is preserved. Prominence of the ventricles and sulci reflects mild generalized parenchymal volume loss. Patchy hypoattenuation in the supratentorial white matter is nonspecific but may reflect mild chronic microvascular ischemic changes. Vascular: Intracranial atherosclerotic calcification at the skull base. Skull: Intact. Other: Mastoid air cells are clear. CT MAXILLOFACIAL FINDINGS Osseous: No acute facial fracture. Degenerative changes of the left temporomandibular joint. Degenerative changes of the visualized cervical spine. Orbits: No intraorbital hematoma. Sinuses: Mild mucosal thickening. Soft tissues: Unremarkable. IMPRESSION: No evidence of acute intracranial injury. No acute facial fracture. Chronic/nonemergent findings detailed above. Electronically Signed   By: Macy Mis M.D.   On: 05/12/2020 15:57        Scheduled Meds: . aspirin EC  81 mg Oral Daily  . atropine      . carvedilol  3.125 mg Oral BID  . chlorhexidine  15 mL Mouth Rinse BID  . clopidogrel  75 mg Oral Daily  . enoxaparin (LOVENOX) injection  40 mg Subcutaneous Q24H  . feeding supplement (ENSURE ENLIVE)  237 mL Oral BID BM  . finasteride  5 mg Oral Daily  . mouth rinse  15 mL Mouth Rinse q12n4p  . multivitamin with minerals  1 tablet Oral Daily  . pyridostigmine  30 mg Oral Once  . spironolactone  12.5 mg Oral Daily   Continuous Infusions:   LOS: 2 days    Time spent: 35 minutes.     Elmarie Shiley, MD [facility]   If 7PM-7AM, please contact night-coverage [email or web address]  05/14/2020, 11:59 AM

## 2020-05-15 LAB — URINE CULTURE: Culture: 30000 — AB

## 2020-05-15 MED ORDER — PYRIDOSTIGMINE BROMIDE 60 MG PO TABS: 30.0000 mg | ORAL_TABLET | Freq: Three times a day (TID) | ORAL | 2 refills | Status: DC

## 2020-05-15 MED ORDER — PYRIDOSTIGMINE BROMIDE 60 MG PO TABS
30.0000 mg | ORAL_TABLET | Freq: Three times a day (TID) | ORAL | Status: DC
  Administered 2020-05-15: 30 mg via ORAL
  Filled 2020-05-15 (×2): qty 0.5

## 2020-05-15 NOTE — Plan of Care (Signed)
  Problem: Education: Goal: Knowledge of disease or condition will improve Outcome: Progressing   

## 2020-05-15 NOTE — Progress Notes (Signed)
NIF: -20 VC: 1.5L  With good patient effort.

## 2020-05-15 NOTE — Progress Notes (Signed)
Occupational Therapy Treatment Patient Details Name: [name] MRN: [id number] DOB: Jan 31, 1931 Today's Date: 05/15/2020    History of present illness This 86 y.o. male admitted with dysphagia, slurred speech, generalized weakness, and fall.   CT of head showed no acute intracranial injury.  Work up underway.  PMH includes:  symptomatic bradycardia, solitary kidney, HTN, CAD, s/p ORIF of Rt humerus, s/p pacemaker    OT comments  Patient continues to make steady progress towards goals in skilled OT session. Patient's session encompassed ADLs and functional ambulation in order to increase overall activity tolerance and endurance. Pt with increased balance in session, and able to ambulate to sink and around the bed to the recliner with no need for external support. Pt remains extremely motivated to go home and get back to his apartment. Discharge remains appropriate, will continue to follow acutely.    Follow Up Recommendations  No OT follow up;Supervision - Intermittent    Equipment Recommendations  Tub/shower seat    Recommendations for Other Services      Precautions / Restrictions Precautions Precautions: Fall Restrictions Weight Bearing Restrictions: No       Mobility Bed Mobility Overal bed mobility: Modified Independent             General bed mobility comments: bed rails, increased time  Transfers Overall transfer level: Needs assistance   Transfers: Sit to/from Stand Sit to Stand: Min guard         General transfer comment: min guard for safety     Balance Overall balance assessment: Needs assistance Sitting-balance support: Feet supported Sitting balance-Leahy Scale: Good     Standing balance support: No upper extremity supported;During functional activity Standing balance-Leahy Scale: Good                             ADL either performed or assessed with clinical judgement   ADL Overall ADL's : Needs assistance/impaired      Grooming: Wash/dry hands;Wash/dry face;Oral care;Brushing hair;Min guard;Standing                   Toilet Transfer: Min guard;Ambulation;Comfort height toilet Toilet Transfer Details (indicate cue type and reason): Simulated with recliner         Functional mobility during ADLs: Min guard General ADL Comments: Pt able to ambulate without use of RW, with no LOB noted, or need for external support     Vision       Perception     Praxis      Cognition Arousal/Alertness: Awake/alert Behavior During Therapy: WFL for tasks assessed/performed Overall Cognitive Status: Within Functional Limits for tasks assessed                                          Exercises     Shoulder Instructions       General Comments      Pertinent Vitals/ Pain       Pain Assessment: No/denies pain  Home Living                                          Prior Functioning/Environment              Frequency  Min 2X/week  Progress Toward Goals  OT Goals(current goals can now be found in the care plan section)  Progress towards OT goals: Progressing toward goals  Acute Rehab OT Goals Patient Stated Goal: to improve balance and endurance  OT Goal Formulation: With patient Time For Goal Achievement: 05/27/20 Potential to Achieve Goals: Good  Plan Discharge plan remains appropriate    Co-evaluation                 AM-PAC OT "6 Clicks" Daily Activity     Outcome Measure   Help from another person eating meals?: None Help from another person taking care of personal grooming?: None Help from another person toileting, which includes using toliet, bedpan, or urinal?: A Little Help from another person bathing (including washing, rinsing, drying)?: A Little Help from another person to put on and taking off regular upper body clothing?: A Little Help from another person to put on and taking off regular lower body clothing?: A  Little 6 Click Score: 20    End of Session    OT Visit Diagnosis: Unsteadiness on feet (R26.81)   Activity Tolerance Patient tolerated treatment well   Patient Left in chair;with call bell/phone within reach;with chair alarm set   Nurse Communication Mobility status        Time: 0211-1552 OT Time Calculation (min): 21 min  Charges: OT Treatments $Self Care/Home Management : 8-22 mins  Corinne Ports E. [place], [place] Acute Rehabilitation Services [place] 05/15/2020, 9:53 AM

## 2020-05-15 NOTE — Discharge Summary (Signed)
Physician Discharge Summary  KEYSTON ARDOLINO NTZ:[id number] DOB: 06/05/1931 DOA: 05/12/2020  PCP: Shirline Frees, MD  Admit date: 05/12/2020 Discharge date: 05/15/2020  Admitted From: Home  Disposition: Home   Recommendations for Outpatient Follow-up:  1. Follow up with PCP in 1-2 weeks 2. Please obtain BMP/CBC in one week 3. Follow up with neurology for results of ; Acetylcholine receptor antibody, voltage-gated calcium channel antibody titer for Lambert-Eaton myasthenic syndrome. 4. Further discussion about IVIG  Home Health: yes. PT   Discharge Condition: Stable.  CODE STATUS: Full code Diet recommendation: puree diet  Brief/Interim Summary: 86 year old with past medical history significant for hypertension, BPH, hyperlipidemia, CAD status post a stent, symptomatic bradycardia status post pacemaker, presented to the emergency department complaining of dysphagia, slurry speech, weakness, balance problem and fall. He report cough was one month. Daughter noticed that he has not been able to hold his head up. CT head negative for acute finding. Unable to do MRI, due to pacemaker, not safe. Working diagnosis Myasthenia gravis.    -Dysphagia, slurred speech, gait problems, weakness; fall: neurology considering Myasthenia Gravis.  -CT head negative.  -Oxygen on ambulation above 90.  -No evidence of infection.  -Pacemaker is incompatible, not safe to perform MRI.  -Neurology consulted.  -Speech swallow evaluation. Recommended puree diet.  -B 12, TSH normal. CK normal.  -Temazepam discontinue bev=cause can exacerbate myasthenia.  -Acetylcholine receptor Ab pending.  anti-voltage gated calcium channel antibody.  -Patient received two doses of Mestinon with improvement of symptoms. Neurology discussed with patient and daughter, risk and  option for IVIG vs oral medication and IVIG out patient. Patient wants to go home. I also explain to patient and daughter risk for decompensation.  His NIF  has improved from days ago, today at -20.  2-HTN;  Hold coreg, to avoid further bradycardia now that patient will be on mestinon.  Resume spironolactone 8/27  3-Leukocytosis: Reactive. Chest x-ray negative for pneumonia. Resolved  4-Macrocytosis: Q75 normal Folic acid Normal.  CKD stage II: Is stable  BPH: Resume Proscar Carotid artery stenosis: Continue with aspirin statin and Plavix     Discharge Diagnoses:  Principal Problem:   Stroke-like symptom Active Problems:   Hyperlipidemia LDL goal <70   HYPERTENSION, BENIGN   SSS (sick sinus syndrome) (HCC)   Pacemaker-St.Jude   Leukocytosis   Macrocytosis   Stroke-like symptoms    Discharge Instructions  Discharge Instructions    Diet - low sodium heart healthy   Complete by: As directed    Puree diet   Increase activity slowly   Complete by: As directed      Allergies as of 05/15/2020      Reactions   Cyclosporine    unknown   Maxzide [triamterene-hctz] Other (See Comments)   Adverse reaction-bloating and abdominal pain   Sulfa Antibiotics    Sulfur Other (See Comments)   No energy, could not walk    Prednisone Other (See Comments)   Can tolerate for a short period of time Unknown reaction      Medication List    STOP taking these medications   atorvastatin 40 MG tablet Commonly known as: LIPITOR   carvedilol 3.125 MG tablet Commonly known as: COREG   HYDROcodone-acetaminophen 5-325 MG tablet Commonly known as: NORCO/VICODIN   oxyCODONE-acetaminophen 5-325 MG tablet Commonly known as: PERCOCET/ROXICET   temazepam 15 MG capsule Commonly known as: RESTORIL     TAKE these medications   acetaminophen 325 MG tablet Commonly known as: TYLENOL Take 650 mg  by mouth every 6 (six) hours as needed for mild pain.   amLODipine 5 MG tablet Commonly known as: NORVASC Take 1 tablet by mouth once daily   aspirin 81 MG tablet Take 81 mg by mouth daily.   beta carotene w/minerals tablet Take 1  tablet by mouth daily.   CENTRUM SILVER PO Take 1 capsule by mouth daily.   clopidogrel 75 MG tablet Commonly known as: PLAVIX Take 1 tablet by mouth once daily   finasteride 5 MG tablet Commonly known as: PROSCAR Take 5 mg by mouth daily.   fluticasone 50 MCG/ACT nasal spray Commonly known as: FLONASE Place 1 spray into both nostrils daily as needed for allergies or rhinitis.   furosemide 40 MG tablet Commonly known as: LASIX Take 1 tablet (40 mg total) by mouth 2 (two) times a week. What changed:   when to take this  additional instructions   Nitrostat 0.4 MG SL tablet Generic drug: nitroGLYCERIN DISSOLVE ONE TABLET UNDER THE TONGUE EVERY 5 MINUTES AS NEEDED FOR CHEST PAIN.  DO NOT EXCEED A TOTAL OF 3 DOSES IN 15 MINUTES What changed: See the new instructions.   potassium chloride 10 MEQ tablet Commonly known as: KLOR-CON Take 1 tablet (10 mEq total) by mouth 2 (two) times a week. Take with furosemide   pyridostigmine 60 MG tablet Commonly known as: MESTINON Take 0.5 tablets (30 mg total) by mouth every 8 (eight) hours.   spironolactone 25 MG tablet Commonly known as: ALDACTONE Take 1/2 (one-half) tablet by mouth once daily What changed: See the new instructions.            Durable Medical Equipment  (From admission, onward)         Start     Ordered   05/14/20 1053  For home use only DME Walker rolling  Once       Question Answer Comment  Walker: With 5 Inch Wheels   Patient needs a walker to treat with the following condition Generalized weakness      05/14/20 1052          Allergies  Allergen Reactions  . Cyclosporine     unknown  . Maxzide [Triamterene-Hctz] Other (See Comments)    Adverse reaction-bloating and abdominal pain  . Sulfa Antibiotics   . Sulfur Other (See Comments)    No energy, could not walk   . Prednisone Other (See Comments)    Can tolerate for a short period of time Unknown reaction    Consultations:  Neurology     Procedures/Studies: DG Chest 2 View  Result Date: 05/12/2020 CLINICAL DATA:  Recent fall today with cough and dysphagia, initial encounter EXAM: CHEST - 2 VIEW COMPARISON:  04/09/2020 FINDINGS: Cardiac shadow is stable. Pacing device is again seen. Aortic calcifications are again noted. Mild chronic interstitial changes are again seen stable from the prior study. No focal infiltrate or effusion is seen. No bony abnormality is noted. IMPRESSION: Chronic interstitial changes without acute abnormality. Electronically Signed   By: Inez Catalina M.D.   On: 05/12/2020 18:50   DG Cervical Spine 2 or 3 views  Result Date: 05/13/2020 CLINICAL DATA:  Neck pain after falling yesterday EXAM: CERVICAL SPINE - 2-3 VIEW COMPARISON:  None FINDINGS: Osseous demineralization. Prevertebral soft tissues normal thickness. Vertebral body heights maintained without fracture or subluxation. Multilevel disc space narrowing and endplate spur formation. Multilevel facet degenerative changes. Biapical lung scarring. Atherosclerotic calcification aorta. LEFT subclavian pacemaker. IMPRESSION: Degenerative disc and facet disease changes  cervical spine. No acute abnormalities. Electronically Signed   By: Lavonia Dana M.D.   On: 05/13/2020 16:49   CT Head Wo Contrast  Result Date: 05/12/2020 CLINICAL DATA:  Fall EXAM: CT HEAD WITHOUT CONTRAST CT MAXILLOFACIAL WITHOUT CONTRAST TECHNIQUE: Multidetector CT imaging of the head and maxillofacial structures were performed using the standard protocol without intravenous contrast. Multiplanar CT image reconstructions of the maxillofacial structures were also generated. COMPARISON:  None. FINDINGS: CT HEAD FINDINGS Brain: There is no acute intracranial hemorrhage, mass effect, or edema. Gray-white differentiation is preserved. Prominence of the ventricles and sulci reflects mild generalized parenchymal volume loss. Patchy hypoattenuation in the supratentorial white matter is nonspecific but  may reflect mild chronic microvascular ischemic changes. Vascular: Intracranial atherosclerotic calcification at the skull base. Skull: Intact. Other: Mastoid air cells are clear. CT MAXILLOFACIAL FINDINGS Osseous: No acute facial fracture. Degenerative changes of the left temporomandibular joint. Degenerative changes of the visualized cervical spine. Orbits: No intraorbital hematoma. Sinuses: Mild mucosal thickening. Soft tissues: Unremarkable. IMPRESSION: No evidence of acute intracranial injury. No acute facial fracture. Chronic/nonemergent findings detailed above. Electronically Signed   By: Macy Mis M.D.   On: 05/12/2020 15:57   DG Swallowing Func-Speech Pathology  Result Date: 05/13/2020 Objective Swallowing Evaluation: Type of Study: Bedside Swallow Evaluation  Patient Details Name: BRUCE CHURILLA MRN: [id number] Date of Birth: 14-Sep-1931 Today's Date: 05/13/2020 Time: SLP Start Time (ACUTE ONLY): 1400 -SLP Stop Time (ACUTE ONLY): 1425 SLP Time Calculation (min) (ACUTE ONLY): 25 min Past Medical History: Past Medical History: Diagnosis Date . Carotid artery disease ([place])  . Coronary artery disease   PCI RCA 2009 . Dyslipidemia  . Hypertension  . Scleritis, unspecified   treated with prednisone . Seizure [facility])   felt due to cyclosporin in setting of electrolyte disorder . Solitary kidney  . Symptomatic bradycardia   a. s/p STJ dual chamber pacemaker Past Surgical History: Past Surgical History: Procedure Laterality Date . CARDIAC CATHETERIZATION    12 years ago? . COLONOSCOPY   . EYE SURGERY    right eye about 10 years ago . ORIF HUMERUS FRACTURE Right 07/03/2019  Procedure: Right distal humerus open reduction, internal fixation;  Surgeon: Verner Mould, MD;  Location: [facility];  Service: Orthopedics;  Laterality: Right; . PACEMAKER INSERTION  2009  STJ dual chamber pacemaker implanted by Dr Olevia Perches for symptomatic bradycardia HPI: SILVER PARKEY is a 86 y.o. male with medical history significant of  hypertension, BPH, hyperlipidemia, coronary artery disease status post stent placement, symptomatic bradycardia status post pacemaker placement presents to emergency department with dysphagia, slurred speech, generalized weakness over past 7-10 days.  Subjective: Pt and daughter, Shirlean Mylar, confirm worsening dysphagia and dysarthria over the past 7-10 days. Assessment / Plan / Recommendation CHL IP CLINICAL IMPRESSIONS 05/13/2020 Clinical Impression Mr. Pollio demonstrates a moderate pharyngeal dysphagia marked by anatomical difference of significantly curved epiglottis toward hyoid, collecting severe vallecular residue. He had reduced hyoid excursion, but fair laryngeal elevation. Reduced hyoid excursion and base of tongue retraction resulted in reduced UES opening and subsequent moderate residue in pyriform sinus/just above UES. Pt is sensate to residue and attempts multiple swallows and liquid wash, but has limited success. Strategies of chin tuck, head turn L, head turn R, and effortful swallow were all ineffective. He was noted with single instance of trace and transient aspiration with thin liquids and consistent transient deep penetration of thin liquids. Nectar thick liquids were trialed, however, given increased residue with increased viscosity, this is  not an effective choice for him at this time. Nectar thick liquids did resolve the aspiration, so should he present with respiratory distress or infection, this would be a good option. At this time, pt appears to be a functional aspirator by clearing aspiration with a spontaneous cough as well as using diligent oral care to prevent infection. Although residue was generally equivalent between solids and purees, pt feels that purees "go down easier," so recommend pureed consistency at his request. May upgrade as he wishes. Recommend: thin liquids in single sips, dysphagia 1 solids (purees), alternate liquids/solids, intensive dysphagia treatment while on acute and  on outpatient level SLP Visit Diagnosis Dysphagia, unspecified (R13.10);Dysarthria and anarthria (R47.1) Impact on safety and function Mild aspiration risk   No flowsheet data found.  Prognosis 05/13/2020 Prognosis for Safe Diet Advancement Good Barriers to Reach Goals Severity of deficits Barriers/Prognosis Comment -- CHL IP DIET RECOMMENDATION 05/13/2020 SLP Diet Recommendations Dysphagia 1 (Puree) solids;Thin liquid Liquid Administration via Straw;Cup Medication Administration -- Compensations Slow rate;Follow solids with liquid;Small sips/bites Postural Changes Remain semi-upright after after feeds/meals (Comment);Seated upright at 90 degrees   CHL IP OTHER RECOMMENDATIONS 05/13/2020 Recommended Consults -- Oral Care Recommendations Oral care BID;Patient independent with oral care Other Recommendations --   CHL IP FOLLOW UP RECOMMENDATIONS 05/13/2020 Follow up Recommendations Outpatient SLP   CHL IP FREQUENCY AND DURATION 05/13/2020 Speech Therapy Frequency (ACUTE ONLY) min 2x/week Treatment Duration 2 weeks      CHL IP ORAL PHASE 05/13/2020 Oral Phase WFL  CHL IP PHARYNGEAL PHASE 05/13/2020 Pharyngeal Phase Impaired Pharyngeal- Thin Straw Reduced epiglottic inversion;Reduced anterior laryngeal mobility;Penetration/Aspiration before swallow;Trace aspiration;Pharyngeal residue - valleculae Pharyngeal Material enters airway, passes BELOW cords then ejected out Pharyngeal- Puree Reduced tongue base retraction;Reduced epiglottic inversion;Reduced anterior laryngeal mobility;Pharyngeal residue - valleculae Pharyngeal- Regular Reduced epiglottic inversion;Reduced tongue base retraction;Pharyngeal residue - valleculae  CHL IP CERVICAL ESOPHAGEAL PHASE 05/13/2020 Cervical Esophageal Phase Impaired Thin Straw Reduced cricopharyngeal relaxation Puree Reduced cricopharyngeal relaxation Mechanical Soft -- Regular Reduced cricopharyngeal relaxation Madison P. Isenhour, M.S., CCC-SLP Speech-Language Pathologist Acute Rehabilitation  Services Pager: [place] 05/13/2020, 3:37 PM              CT Maxillofacial Wo Contrast  Result Date: 05/12/2020 CLINICAL DATA:  Fall EXAM: CT HEAD WITHOUT CONTRAST CT MAXILLOFACIAL WITHOUT CONTRAST TECHNIQUE: Multidetector CT imaging of the head and maxillofacial structures were performed using the standard protocol without intravenous contrast. Multiplanar CT image reconstructions of the maxillofacial structures were also generated. COMPARISON:  None. FINDINGS: CT HEAD FINDINGS Brain: There is no acute intracranial hemorrhage, mass effect, or edema. Gray-white differentiation is preserved. Prominence of the ventricles and sulci reflects mild generalized parenchymal volume loss. Patchy hypoattenuation in the supratentorial white matter is nonspecific but may reflect mild chronic microvascular ischemic changes. Vascular: Intracranial atherosclerotic calcification at the skull base. Skull: Intact. Other: Mastoid air cells are clear. CT MAXILLOFACIAL FINDINGS Osseous: No acute facial fracture. Degenerative changes of the left temporomandibular joint. Degenerative changes of the visualized cervical spine. Orbits: No intraorbital hematoma. Sinuses: Mild mucosal thickening. Soft tissues: Unremarkable. IMPRESSION: No evidence of acute intracranial injury. No acute facial fracture. Chronic/nonemergent findings detailed above. Electronically Signed   By: Macy Mis M.D.   On: 05/12/2020 15:57     Subjective: He is feeling better. He felt significant improvement with mestinon.  Discharge Exam: Vitals:   05/15/20 0331 05/15/20 0835  BP: (!) 156/78 131/68  Pulse: (!) 59 (!) 59  Resp: 20 18  Temp: (!) 97.5  F (36.4 C) 97.7 F (36.5 C)  SpO2: 96% 94%     General: Pt is alert, awake, not in acute distress Cardiovascular: RRR, S1/S2 +, no rubs, no gallops Respiratory: CTA bilaterally, no wheezing, no rhonchi Abdominal: Soft, NT, ND, bowel sounds + Extremities: no edema, no  cyanosis    The results of significant diagnostics from this hospitalization (including imaging, microbiology, ancillary and laboratory) are listed below for reference.     Microbiology: Recent Results (from the past 240 hour(s))  SARS Coronavirus 2 by RT PCR (hospital order, performed in [facility] hospital lab) Nasopharyngeal Nasopharyngeal Swab     Status: None   Collection Time: 05/12/20  5:48 PM   Specimen: Nasopharyngeal Swab  Result Value Ref Range Status   SARS Coronavirus 2 NEGATIVE NEGATIVE Final    Comment: (NOTE) SARS-CoV-2 target nucleic acids are NOT DETECTED.  The SARS-CoV-2 RNA is generally detectable in upper and lower respiratory specimens during the acute phase of infection. The lowest concentration of SARS-CoV-2 viral copies this assay can detect is 250 copies / mL. A negative result does not preclude SARS-CoV-2 infection and should not be used as the sole basis for treatment or other patient management decisions.  A negative result may occur with improper specimen collection / handling, submission of specimen other than nasopharyngeal swab, presence of viral mutation(s) within the areas targeted by this assay, and inadequate number of viral copies (<250 copies / mL). A negative result must be combined with clinical observations, patient history, and epidemiological information.  Fact Sheet for Patients:   StrictlyIdeas.no  Fact Sheet for Healthcare Providers: BankingDealers.co.za  This test is not yet approved or  cleared by the [place] FDA and has been authorized for detection and/or diagnosis of SARS-CoV-2 by FDA under an Emergency Use Authorization (EUA).  This EUA will remain in effect (meaning this test can be used) for the duration of the COVID-19 declaration under Section 564(b)(1) of the Act, 21 U.S.C. section 360bbb-3(b)(1), unless the authorization is terminated or revoked sooner.  Performed  at [facility], [place] [address], [place], [place] [address]   Urine culture     Status: Abnormal (Preliminary result)   Collection Time: 05/12/20 10:00 PM   Specimen: Urine, Random  Result Value Ref Range Status   Specimen Description URINE, RANDOM  Final   Special Requests NONE  Final   Culture (A)  Final    30,000 COLONIES/mL PROTEUS VULGARIS CULTURE REINCUBATED FOR BETTER GROWTH Performed at [facility], [place] [address], [place], [place] [address]    Report Status PENDING  Incomplete     Labs: BNP (last 3 results) No results for input(s): BNP in the last 8760 hours. Basic Metabolic Panel: Recent Labs  Lab 05/12/20 1246 05/12/20 1311 05/12/20 1822 05/13/20 0644 05/14/20 0816  NA 137 136  --  136 135  K 3.7 3.7  --  3.9 3.6  CL 100 101  --  99 100  CO2 26  --   --  21* 26  GLUCOSE 104* 101*  --  86 106*  BUN 19 21  --  20 23  CREATININE 1.12 1.00 0.92 1.12 1.04  CALCIUM 9.5  --   --  9.2 9.1   Liver Function Tests: Recent Labs  Lab 05/12/20 1246  AST 28  ALT 25  ALKPHOS 89  BILITOT 0.9  PROT 7.2  ALBUMIN 3.5   No results for input(s): LIPASE, AMYLASE in the last 168 hours. No  results for input(s): AMMONIA in the last 168 hours. CBC: Recent Labs  Lab 05/12/20 1246 05/12/20 1311 05/12/20 1822 05/13/20 0644  WBC 14.2*  --  11.2* 8.7  NEUTROABS 11.8*  --   --  6.3  HGB 14.2 13.9 14.5 13.5  HCT 43.7 41.0 43.8 41.6  MCV 101.4*  --  98.4 98.1  PLT 176  --  188 162   Cardiac Enzymes: Recent Labs  Lab 05/12/20 1822  CKTOTAL 87   BNP: Invalid input(s): POCBNP CBG: No results for input(s): GLUCAP in the last 168 hours. D-Dimer No results for input(s): DDIMER in the last 72 hours. Hgb A1c Recent Labs    05/13/20 0644  HGBA1C 6.1*   Lipid Profile Recent Labs    05/13/20 0644  CHOL 138  HDL 53  LDLCALC 70  TRIG 77  CHOLHDL 2.6   Thyroid function studies Recent Labs    05/12/20 1822  TSH 1.734   Anemia work up Recent  Labs    05/12/20 1822  VITAMINB12 956*  FOLATE 44.4   Urinalysis    Component Value Date/Time   COLORURINE YELLOW 05/12/2020 [place] 05/12/2020 2200   LABSPEC 1.016 05/12/2020 2200   PHURINE 6.0 05/12/2020 2200   GLUCOSEU NEGATIVE 05/12/2020 2200   GLUCOSEU NEGATIVE 08/14/2011 1058   HGBUR NEGATIVE 05/12/2020 2200   BILIRUBINUR NEGATIVE 05/12/2020 2200   KETONESUR 20 (A) 05/12/2020 2200   PROTEINUR NEGATIVE 05/12/2020 2200   UROBILINOGEN 0.2 08/14/2011 1058   NITRITE NEGATIVE 05/12/2020 2200   LEUKOCYTESUR NEGATIVE 05/12/2020 2200   Sepsis Labs Invalid input(s): PROCALCITONIN,  WBC,  LACTICIDVEN Microbiology Recent Results (from the past 240 hour(s))  SARS Coronavirus 2 by RT PCR (hospital order, performed in [facility] hospital lab) Nasopharyngeal Nasopharyngeal Swab     Status: None   Collection Time: 05/12/20  5:48 PM   Specimen: Nasopharyngeal Swab  Result Value Ref Range Status   SARS Coronavirus 2 NEGATIVE NEGATIVE Final    Comment: (NOTE) SARS-CoV-2 target nucleic acids are NOT DETECTED.  The SARS-CoV-2 RNA is generally detectable in upper and lower respiratory specimens during the acute phase of infection. The lowest concentration of SARS-CoV-2 viral copies this assay can detect is 250 copies / mL. A negative result does not preclude SARS-CoV-2 infection and should not be used as the sole basis for treatment or other patient management decisions.  A negative result may occur with improper specimen collection / handling, submission of specimen other than nasopharyngeal swab, presence of viral mutation(s) within the areas targeted by this assay, and inadequate number of viral copies (<250 copies / mL). A negative result must be combined with clinical observations, patient history, and epidemiological information.  Fact Sheet for Patients:   StrictlyIdeas.no  Fact Sheet for Healthcare  Providers: BankingDealers.co.za  This test is not yet approved or  cleared by the [place] FDA and has been authorized for detection and/or diagnosis of SARS-CoV-2 by FDA under an Emergency Use Authorization (EUA).  This EUA will remain in effect (meaning this test can be used) for the duration of the COVID-19 declaration under Section 564(b)(1) of the Act, 21 U.S.C. section 360bbb-3(b)(1), unless the authorization is terminated or revoked sooner.  Performed at [facility], [place] [address] [place], [place] [address]   Urine culture     Status: Abnormal (Preliminary result)   Collection Time: 05/12/20 10:00 PM   Specimen: Urine, Random  Result Value Ref Range Status   Specimen Description URINE,  RANDOM  Final   Special Requests NONE  Final   Culture (A)  Final    30,000 COLONIES/mL PROTEUS VULGARIS CULTURE REINCUBATED FOR BETTER GROWTH Performed at [facility], [address] [place], [place] [address]    Report Status PENDING  Incomplete     Time coordinating discharge: 40 minutes  SIGNED:   Elmarie Shiley, MD  [facility]

## 2020-05-15 NOTE — Care Management (Signed)
Spoke w patient's daughter at bedside. CM will email list of private duty caregiver agencies for her use.

## 2020-05-15 NOTE — Plan of Care (Signed)
  Problem: Education: Goal: Knowledge of disease or condition will improve 05/15/2020 1058 by Caroll Rancher, RN Outcome: Adequate for Discharge 05/15/2020 0900 by Caroll Rancher, RN Outcome: Progressing

## 2020-05-15 NOTE — Discharge Instructions (Signed)
Please follow up with [facility] within 1 week.   If you develop worsening difficulty swallowing, weakness difficulty breathing you will need to be evaluated.

## 2020-05-15 NOTE — Progress Notes (Signed)
Patient to be discharged home.  Patient to be transported by his daughter.  Discharge instructions and prescription information given to the patient who verbalized understanding.

## 2020-05-17 ENCOUNTER — Telehealth: Payer: Self-pay

## 2020-05-17 NOTE — Telephone Encounter (Signed)
The pt daughter is trying to schedule an appointment with dr. Harrington Challenger and Chanetta Marshall in the same day at the end off September. I let her speak with [facility] to set up an appointment.

## 2020-05-18 DIAGNOSIS — G7000 Myasthenia gravis without (acute) exacerbation: Secondary | ICD-10-CM | POA: Diagnosis not present

## 2020-05-18 DIAGNOSIS — I10 Essential (primary) hypertension: Secondary | ICD-10-CM | POA: Diagnosis not present

## 2020-05-18 DIAGNOSIS — I129 Hypertensive chronic kidney disease with stage 1 through stage 4 chronic kidney disease, or unspecified chronic kidney disease: Secondary | ICD-10-CM | POA: Diagnosis not present

## 2020-05-18 DIAGNOSIS — R1310 Dysphagia, unspecified: Secondary | ICD-10-CM | POA: Diagnosis not present

## 2020-05-18 DIAGNOSIS — I2510 Atherosclerotic heart disease of native coronary artery without angina pectoris: Secondary | ICD-10-CM | POA: Diagnosis not present

## 2020-05-18 DIAGNOSIS — I495 Sick sinus syndrome: Secondary | ICD-10-CM | POA: Diagnosis not present

## 2020-05-18 DIAGNOSIS — D7589 Other specified diseases of blood and blood-forming organs: Secondary | ICD-10-CM | POA: Diagnosis not present

## 2020-05-18 DIAGNOSIS — Z950 Presence of cardiac pacemaker: Secondary | ICD-10-CM | POA: Diagnosis not present

## 2020-05-18 DIAGNOSIS — E785 Hyperlipidemia, unspecified: Secondary | ICD-10-CM | POA: Diagnosis not present

## 2020-05-18 DIAGNOSIS — N400 Enlarged prostate without lower urinary tract symptoms: Secondary | ICD-10-CM | POA: Diagnosis not present

## 2020-05-18 DIAGNOSIS — Z955 Presence of coronary angioplasty implant and graft: Secondary | ICD-10-CM | POA: Diagnosis not present

## 2020-05-18 DIAGNOSIS — Z9181 History of falling: Secondary | ICD-10-CM | POA: Diagnosis not present

## 2020-05-18 DIAGNOSIS — Z7902 Long term (current) use of antithrombotics/antiplatelets: Secondary | ICD-10-CM | POA: Diagnosis not present

## 2020-05-18 DIAGNOSIS — Z7951 Long term (current) use of inhaled steroids: Secondary | ICD-10-CM | POA: Diagnosis not present

## 2020-05-18 DIAGNOSIS — I6529 Occlusion and stenosis of unspecified carotid artery: Secondary | ICD-10-CM | POA: Diagnosis not present

## 2020-05-18 DIAGNOSIS — N182 Chronic kidney disease, stage 2 (mild): Secondary | ICD-10-CM | POA: Diagnosis not present

## 2020-05-19 NOTE — Progress Notes (Signed)
[facility] Clinic Note - Initial Visit   Date: 05/20/20  [name] MRN: [id number] DOB: May 22, 1931   Dear Dr. Kenton Kingfisher:   Thank you for your kind referral of [name] for consultation of dysphagia and dysarthria. Although his history is well known to you, please allow [place] to reiterate it for the purpose of our medical record. The patient was accompanied to the clinic by daughter, Shirlean Mylar, who also provides collateral information.     History of Present Illness: [name] is a 86 y.o. right-handed male with CAD, hypertension, bradycardia s/p PPM presenting for evaluation of dysphagia and dysarthria.   Starting around early August, he began having difficulty swallowing both solids and liquids, sometimes having choking spells.  Symptoms progressed into slurred speech, neck heaviness, and generalized weakness.  He suffered a fall at home and was unable to stand up so called his daughter who was unable to stand him up.  She called EMS who brought him to [facility].  CT head did not show stroke.  Unable to get MRI due to PPM.  He was seen by Dr. Earnestine Leys who had high clinical suspicion for myasthenia and started him on mestinon 30mg .  Within an 1.5h, he noticed that his eyelids opened and speech improved.  He takes mestinon 7am, 1pm, and 7pm. Patient felt much better and did not wish to stay in the hospital for IVIG and was discharged home. AChR pending. Since being home, he continues to feel better. He no longer has neck weakness/heaviness.  He is on a pureed diet and is tolerating it well.  He denies problems with taking his morning medications.  His speech is still slurred.  No double vision or droopiness of the eyelids.   He lives alone in a one-level apartment.  He was walking unassisted prior to hospitalization. At baseline, his is very active and does his own ADLs and IADLs. He is hoping to travel to [place] in January.   Out-side paper records, electronic  medical record, and images have been reviewed where available and summarized as:  Lab Results  Component Value Date   HGBA1C 6.1 (H) 05/13/2020   Lab Results  Component Value Date   NFAOZHYQ65 784 (H) 05/12/2020   Lab Results  Component Value Date   TSH 1.734 05/12/2020     Past Medical History:  Diagnosis Date  . Carotid artery disease ([place])   . Coronary artery disease    PCI RCA 2009  . Dyslipidemia   . Hypertension   . Scleritis, unspecified    treated with prednisone  . Seizure [facility])    felt due to cyclosporin in setting of electrolyte disorder  . Solitary kidney   . Symptomatic bradycardia    a. s/p STJ dual chamber pacemaker    Past Surgical History:  Procedure Laterality Date  . CARDIAC CATHETERIZATION     12 years ago?  . COLONOSCOPY    . EYE SURGERY     right eye about 10 years ago  . ORIF HUMERUS FRACTURE Right 07/03/2019   Procedure: Right distal humerus open reduction, internal fixation;  Surgeon: Verner Mould, MD;  Location: [place];  Service: Orthopedics;  Laterality: Right;  . PACEMAKER INSERTION  2009   STJ dual chamber pacemaker implanted by Dr Olevia Perches for symptomatic bradycardia     Medications:  Outpatient Encounter Medications as of 05/20/2020  Medication Sig Note  . acetaminophen (TYLENOL) 325 MG tablet Take 650 mg by mouth  every 6 (six) hours as needed for mild pain.   Marland Kitchen amLODipine (NORVASC) 5 MG tablet Take 1 tablet by mouth once daily   . aspirin 81 MG tablet Take 81 mg by mouth daily.    . beta carotene w/minerals (OCUVITE) tablet Take 1 tablet by mouth daily.   . clopidogrel (PLAVIX) 75 MG tablet Take 1 tablet by mouth once daily   . finasteride (PROSCAR) 5 MG tablet Take 5 mg by mouth daily.   . fluticasone (FLONASE) 50 MCG/ACT nasal spray Place 1 spray into both nostrils daily as needed for allergies or rhinitis.   . furosemide (LASIX) 40 MG tablet Take 1 tablet (40 mg total) by mouth 2 (two) times a week. (Patient taking  differently: Take 40 mg by mouth See admin instructions. Take one tablet by mouth on Mondays and Fridays)   . Multiple Vitamins-Minerals (CENTRUM SILVER PO) Take 1 capsule by mouth daily.    Marland Kitchen NITROSTAT 0.4 MG SL tablet DISSOLVE ONE TABLET UNDER THE TONGUE EVERY 5 MINUTES AS NEEDED FOR CHEST PAIN.  DO NOT EXCEED A TOTAL OF 3 DOSES IN 15 MINUTES (Patient taking differently: Place 0.4 mg under the tongue every 5 (five) minutes as needed for chest pain. )   . potassium chloride (KLOR-CON) 10 MEQ tablet Take 1 tablet (10 mEq total) by mouth 2 (two) times a week. Take with furosemide   . pyridostigmine (MESTINON) 60 MG tablet Take 1 tablet at 7am, noon, and 5pm   . spironolactone (ALDACTONE) 25 MG tablet Take 1/2 (one-half) tablet by mouth once daily (Patient taking differently: Take 12.5 mg by mouth daily. )   . [DISCONTINUED] pyridostigmine (MESTINON) 60 MG tablet Take 0.5 tablets (30 mg total) by mouth every 8 (eight) hours. 05/20/2020: Every 6 hours   No facility-administered encounter medications on file as of 05/20/2020.    Allergies:  Allergies  Allergen Reactions  . Cyclosporine     unknown  . Maxzide [Triamterene-Hctz] Other (See Comments)    Adverse reaction-bloating and abdominal pain  . Sulfa Antibiotics   . Sulfur Other (See Comments)    No energy, could not walk   . Prednisone Other (See Comments)    Can tolerate for a short period of time Unknown reaction    Family History: Family History  Problem Relation Age of Onset  . Arthritis/Rheumatoid Mother   . Heart disease Mother   . COPD Father   . Alcohol abuse Son     Social History: Social History   Tobacco Use  . Smoking status: Never Smoker  . Smokeless tobacco: Never Used  Vaping Use  . Vaping Use: Never used  Substance Use Topics  . Alcohol use: Yes    Comment: rarely  . Drug use: No   Social History   Social History Narrative   Married   Tobacco   etoh    Vital Signs:  BP 111/77 (BP Location: Left  Arm, Patient Position: Sitting, Cuff Size: Small)   Pulse 72   Ht 5\' 10"  (1.778 m)   Wt 162 lb 12.8 oz (73.8 kg)   SpO2 98%   BMI 23.36 kg/m   Neurological Exam: MENTAL STATUS including orientation to time, place, person, recent and remote memory, attention span and concentration, language, and fund of knowledge is normal.  Speech is mild-moderately dysarthric.  CRANIAL NERVES: II:  No visual field defects. III-IV-VI: Pupils equal round and reactive.  Normal conjugate, extra-ocular eye movements in all directions of gaze.  No nystagmus.  No ptosis at rest or with sustained upgaze.   V:  Normal facial sensation.    VII:  Mild facial weakness with buccinator, orbicularis oris testing 5-/5 VIII:  Normal hearing and vestibular function.   IX-X:  Normal palatal movement.   XI:  Normal shoulder shrug and head rotation.   XII:  Mild weakness with tongue protrusion to the side 4/5.  MOTOR:  No muscle fatigability. atrophy, fasciculations or abnormal movements.  No pronator drift.  Neck flexion and extension 5/5 Upper Extremity:  Right  Left  Deltoid  5/5   5/5   Biceps  5/5   5/5   Triceps  5/5   5/5   Infraspinatus 5/5  5/5  Medial pectoralis 5/5  5/5  Wrist extensors  5/5   5/5   Wrist flexors  5/5   5/5   Finger extensors  5/5   5/5   Finger flexors  5/5   5/5   Dorsal interossei  5/5   5/5   Abductor pollicis  5/5   5/5   Tone (Ashworth scale)  0  0   Lower Extremity:  Right  Left  Hip flexors  5/5   5/5   Hip extensors  5/5   5/5   Adductor 5/5  5/5  Abductor 5/5  5/5  Knee flexors  5/5   5/5   Knee extensors  5/5   5/5   Dorsiflexors  5/5   5/5   Plantarflexors  5/5   5/5   Toe extensors  5/5   5/5   Toe flexors  5/5   5/5   Tone (Ashworth scale)  0  0   MSRs:  Right        Left                  brachioradialis 2+  2+  biceps 2+  2+  triceps 2+  2+  patellar 2+  2+  ankle jerk 2+  2+  Hoffman no  no  plantar response down  down   SENSORY:  Normal and  symmetric perception of light touch, vibration, and temperature.  COORDINATION/GAIT: Normal finger-to- nose-finger.  Intact rapid alternating movements bilaterally.  Unable to rise from a chair without using arms.  Gait narrow based and stable, without walker.   IMPRESSION: Myasthenia gravis with exacerbation manifesting with generalized weakness, now with ongoing bulbar symptoms.  I had an extensive discussion with the patient regarding the diagnosis, pathogenesis, prognosis, management plan, meds to avoid, and potential crisis myasthenia gravis. I also discussed the warning signs and symptoms of MG crisis (including significant swallowing and breathing difficulty) that should prompt urgent medical evaluation since an exacerbation of myasthenia gravis is potentially life-threatening. He was also provided with a list of medications that can worsen MG and that he should avoid. He was told to present this list any physician prescribing medications, as well as any pharmacist issuing over-the-counter drugs.   PLAN/RECOMMENDATIONS:  Increase mestinon to 60mg  three times daily at 7am, noon, and 5pm Start PA for IVIG 2g/kg over 3-5 days for home infusion.  Risks and benefits discussed Patient reports having adverse reaction to prednisone in the past and does not wish to take this Follow-up with AChR antibody panel  Return to clinic in 3-4 weeks  Total time spent: 70 min   Thank you for allowing me to participate in patient's care.  If I can answer any additional questions, I would be pleased to do so.  Sincerely,    Antwane Grose K. Posey Pronto, DO

## 2020-05-20 ENCOUNTER — Encounter: Payer: Self-pay | Admitting: Neurology

## 2020-05-20 ENCOUNTER — Ambulatory Visit (INDEPENDENT_AMBULATORY_CARE_PROVIDER_SITE_OTHER): Payer: Medicare Other | Admitting: Neurology

## 2020-05-20 ENCOUNTER — Other Ambulatory Visit: Payer: Self-pay

## 2020-05-20 VITALS — BP 111/77 | HR 72 | Ht 70.0 in | Wt 162.8 lb

## 2020-05-20 DIAGNOSIS — I6523 Occlusion and stenosis of bilateral carotid arteries: Secondary | ICD-10-CM | POA: Diagnosis not present

## 2020-05-20 DIAGNOSIS — G7001 Myasthenia gravis with (acute) exacerbation: Secondary | ICD-10-CM | POA: Diagnosis not present

## 2020-05-20 LAB — MISC LABCORP TEST (SEND OUT): Labcorp test code: 140640

## 2020-05-20 MED ORDER — PYRIDOSTIGMINE BROMIDE 60 MG PO TABS
ORAL_TABLET | ORAL | 3 refills | Status: DC
Start: 2020-05-20 — End: 2020-07-16

## 2020-05-20 NOTE — Patient Instructions (Addendum)
Increase mestinon to 60mg  three times daily at 7am, 1, and 7pm  We will start authorization for IVIG  Return to clinic in 3-4 weeks

## 2020-05-21 DIAGNOSIS — D7589 Other specified diseases of blood and blood-forming organs: Secondary | ICD-10-CM | POA: Diagnosis not present

## 2020-05-21 DIAGNOSIS — I129 Hypertensive chronic kidney disease with stage 1 through stage 4 chronic kidney disease, or unspecified chronic kidney disease: Secondary | ICD-10-CM | POA: Diagnosis not present

## 2020-05-21 DIAGNOSIS — N182 Chronic kidney disease, stage 2 (mild): Secondary | ICD-10-CM | POA: Diagnosis not present

## 2020-05-21 DIAGNOSIS — I6529 Occlusion and stenosis of unspecified carotid artery: Secondary | ICD-10-CM | POA: Diagnosis not present

## 2020-05-21 DIAGNOSIS — G7000 Myasthenia gravis without (acute) exacerbation: Secondary | ICD-10-CM | POA: Diagnosis not present

## 2020-05-21 DIAGNOSIS — R1310 Dysphagia, unspecified: Secondary | ICD-10-CM | POA: Diagnosis not present

## 2020-05-22 LAB — ACETYLCHOLINE RECEPTOR AB, ALL
Acety choline binding ab: 61 nmol/L — ABNORMAL HIGH (ref 0.00–0.24)
Acetylchol Block Ab: 44 % — ABNORMAL HIGH (ref 0–25)
Acetylcholine Modulat Ab: 38 % — ABNORMAL HIGH (ref 0–20)

## 2020-05-25 ENCOUNTER — Institutional Professional Consult (permissible substitution): Payer: Medicare Other | Admitting: Pulmonary Disease

## 2020-05-25 DIAGNOSIS — G7000 Myasthenia gravis without (acute) exacerbation: Secondary | ICD-10-CM | POA: Diagnosis not present

## 2020-05-25 DIAGNOSIS — I6529 Occlusion and stenosis of unspecified carotid artery: Secondary | ICD-10-CM | POA: Diagnosis not present

## 2020-05-25 DIAGNOSIS — R1310 Dysphagia, unspecified: Secondary | ICD-10-CM | POA: Diagnosis not present

## 2020-05-25 DIAGNOSIS — N182 Chronic kidney disease, stage 2 (mild): Secondary | ICD-10-CM | POA: Diagnosis not present

## 2020-05-25 DIAGNOSIS — D7589 Other specified diseases of blood and blood-forming organs: Secondary | ICD-10-CM | POA: Diagnosis not present

## 2020-05-25 DIAGNOSIS — I129 Hypertensive chronic kidney disease with stage 1 through stage 4 chronic kidney disease, or unspecified chronic kidney disease: Secondary | ICD-10-CM | POA: Diagnosis not present

## 2020-05-26 DIAGNOSIS — R1310 Dysphagia, unspecified: Secondary | ICD-10-CM | POA: Diagnosis not present

## 2020-05-26 DIAGNOSIS — D7589 Other specified diseases of blood and blood-forming organs: Secondary | ICD-10-CM | POA: Diagnosis not present

## 2020-05-26 DIAGNOSIS — I129 Hypertensive chronic kidney disease with stage 1 through stage 4 chronic kidney disease, or unspecified chronic kidney disease: Secondary | ICD-10-CM | POA: Diagnosis not present

## 2020-05-26 DIAGNOSIS — G7000 Myasthenia gravis without (acute) exacerbation: Secondary | ICD-10-CM | POA: Diagnosis not present

## 2020-05-26 DIAGNOSIS — I6529 Occlusion and stenosis of unspecified carotid artery: Secondary | ICD-10-CM | POA: Diagnosis not present

## 2020-05-26 DIAGNOSIS — N182 Chronic kidney disease, stage 2 (mild): Secondary | ICD-10-CM | POA: Diagnosis not present

## 2020-05-27 DIAGNOSIS — D7589 Other specified diseases of blood and blood-forming organs: Secondary | ICD-10-CM | POA: Diagnosis not present

## 2020-05-27 DIAGNOSIS — N182 Chronic kidney disease, stage 2 (mild): Secondary | ICD-10-CM | POA: Diagnosis not present

## 2020-05-27 DIAGNOSIS — I6529 Occlusion and stenosis of unspecified carotid artery: Secondary | ICD-10-CM | POA: Diagnosis not present

## 2020-05-27 DIAGNOSIS — R1310 Dysphagia, unspecified: Secondary | ICD-10-CM | POA: Diagnosis not present

## 2020-05-27 DIAGNOSIS — G7000 Myasthenia gravis without (acute) exacerbation: Secondary | ICD-10-CM | POA: Diagnosis not present

## 2020-05-27 DIAGNOSIS — I129 Hypertensive chronic kidney disease with stage 1 through stage 4 chronic kidney disease, or unspecified chronic kidney disease: Secondary | ICD-10-CM | POA: Diagnosis not present

## 2020-05-28 DIAGNOSIS — G7000 Myasthenia gravis without (acute) exacerbation: Secondary | ICD-10-CM | POA: Diagnosis not present

## 2020-05-28 DIAGNOSIS — D7589 Other specified diseases of blood and blood-forming organs: Secondary | ICD-10-CM | POA: Diagnosis not present

## 2020-05-28 DIAGNOSIS — R1310 Dysphagia, unspecified: Secondary | ICD-10-CM | POA: Diagnosis not present

## 2020-05-28 DIAGNOSIS — N182 Chronic kidney disease, stage 2 (mild): Secondary | ICD-10-CM | POA: Diagnosis not present

## 2020-05-28 DIAGNOSIS — I129 Hypertensive chronic kidney disease with stage 1 through stage 4 chronic kidney disease, or unspecified chronic kidney disease: Secondary | ICD-10-CM | POA: Diagnosis not present

## 2020-05-28 DIAGNOSIS — I6529 Occlusion and stenosis of unspecified carotid artery: Secondary | ICD-10-CM | POA: Diagnosis not present

## 2020-05-29 DIAGNOSIS — I129 Hypertensive chronic kidney disease with stage 1 through stage 4 chronic kidney disease, or unspecified chronic kidney disease: Secondary | ICD-10-CM | POA: Diagnosis not present

## 2020-05-29 DIAGNOSIS — G7000 Myasthenia gravis without (acute) exacerbation: Secondary | ICD-10-CM | POA: Diagnosis not present

## 2020-05-29 DIAGNOSIS — I6529 Occlusion and stenosis of unspecified carotid artery: Secondary | ICD-10-CM | POA: Diagnosis not present

## 2020-05-29 DIAGNOSIS — D7589 Other specified diseases of blood and blood-forming organs: Secondary | ICD-10-CM | POA: Diagnosis not present

## 2020-05-29 DIAGNOSIS — N182 Chronic kidney disease, stage 2 (mild): Secondary | ICD-10-CM | POA: Diagnosis not present

## 2020-05-29 DIAGNOSIS — R1310 Dysphagia, unspecified: Secondary | ICD-10-CM | POA: Diagnosis not present

## 2020-06-01 DIAGNOSIS — I129 Hypertensive chronic kidney disease with stage 1 through stage 4 chronic kidney disease, or unspecified chronic kidney disease: Secondary | ICD-10-CM | POA: Diagnosis not present

## 2020-06-01 DIAGNOSIS — D7589 Other specified diseases of blood and blood-forming organs: Secondary | ICD-10-CM | POA: Diagnosis not present

## 2020-06-01 DIAGNOSIS — I6529 Occlusion and stenosis of unspecified carotid artery: Secondary | ICD-10-CM | POA: Diagnosis not present

## 2020-06-01 DIAGNOSIS — R1310 Dysphagia, unspecified: Secondary | ICD-10-CM | POA: Diagnosis not present

## 2020-06-01 DIAGNOSIS — N182 Chronic kidney disease, stage 2 (mild): Secondary | ICD-10-CM | POA: Diagnosis not present

## 2020-06-01 DIAGNOSIS — G7000 Myasthenia gravis without (acute) exacerbation: Secondary | ICD-10-CM | POA: Diagnosis not present

## 2020-06-02 DIAGNOSIS — N182 Chronic kidney disease, stage 2 (mild): Secondary | ICD-10-CM | POA: Diagnosis not present

## 2020-06-02 DIAGNOSIS — R1310 Dysphagia, unspecified: Secondary | ICD-10-CM | POA: Diagnosis not present

## 2020-06-02 DIAGNOSIS — I129 Hypertensive chronic kidney disease with stage 1 through stage 4 chronic kidney disease, or unspecified chronic kidney disease: Secondary | ICD-10-CM | POA: Diagnosis not present

## 2020-06-02 DIAGNOSIS — G7000 Myasthenia gravis without (acute) exacerbation: Secondary | ICD-10-CM | POA: Diagnosis not present

## 2020-06-02 DIAGNOSIS — I6529 Occlusion and stenosis of unspecified carotid artery: Secondary | ICD-10-CM | POA: Diagnosis not present

## 2020-06-02 DIAGNOSIS — D7589 Other specified diseases of blood and blood-forming organs: Secondary | ICD-10-CM | POA: Diagnosis not present

## 2020-06-04 DIAGNOSIS — I6529 Occlusion and stenosis of unspecified carotid artery: Secondary | ICD-10-CM | POA: Diagnosis not present

## 2020-06-04 DIAGNOSIS — G7000 Myasthenia gravis without (acute) exacerbation: Secondary | ICD-10-CM | POA: Diagnosis not present

## 2020-06-04 DIAGNOSIS — D7589 Other specified diseases of blood and blood-forming organs: Secondary | ICD-10-CM | POA: Diagnosis not present

## 2020-06-04 DIAGNOSIS — I129 Hypertensive chronic kidney disease with stage 1 through stage 4 chronic kidney disease, or unspecified chronic kidney disease: Secondary | ICD-10-CM | POA: Diagnosis not present

## 2020-06-04 DIAGNOSIS — R1310 Dysphagia, unspecified: Secondary | ICD-10-CM | POA: Diagnosis not present

## 2020-06-04 DIAGNOSIS — N182 Chronic kidney disease, stage 2 (mild): Secondary | ICD-10-CM | POA: Diagnosis not present

## 2020-06-07 DIAGNOSIS — R1310 Dysphagia, unspecified: Secondary | ICD-10-CM | POA: Diagnosis not present

## 2020-06-07 DIAGNOSIS — G7000 Myasthenia gravis without (acute) exacerbation: Secondary | ICD-10-CM | POA: Diagnosis not present

## 2020-06-07 DIAGNOSIS — I129 Hypertensive chronic kidney disease with stage 1 through stage 4 chronic kidney disease, or unspecified chronic kidney disease: Secondary | ICD-10-CM | POA: Diagnosis not present

## 2020-06-07 DIAGNOSIS — I6529 Occlusion and stenosis of unspecified carotid artery: Secondary | ICD-10-CM | POA: Diagnosis not present

## 2020-06-07 DIAGNOSIS — N182 Chronic kidney disease, stage 2 (mild): Secondary | ICD-10-CM | POA: Diagnosis not present

## 2020-06-07 DIAGNOSIS — D7589 Other specified diseases of blood and blood-forming organs: Secondary | ICD-10-CM | POA: Diagnosis not present

## 2020-06-08 DIAGNOSIS — D7589 Other specified diseases of blood and blood-forming organs: Secondary | ICD-10-CM | POA: Diagnosis not present

## 2020-06-08 DIAGNOSIS — G7000 Myasthenia gravis without (acute) exacerbation: Secondary | ICD-10-CM | POA: Diagnosis not present

## 2020-06-08 DIAGNOSIS — I129 Hypertensive chronic kidney disease with stage 1 through stage 4 chronic kidney disease, or unspecified chronic kidney disease: Secondary | ICD-10-CM | POA: Diagnosis not present

## 2020-06-08 DIAGNOSIS — R1310 Dysphagia, unspecified: Secondary | ICD-10-CM | POA: Diagnosis not present

## 2020-06-08 DIAGNOSIS — I6529 Occlusion and stenosis of unspecified carotid artery: Secondary | ICD-10-CM | POA: Diagnosis not present

## 2020-06-08 DIAGNOSIS — N182 Chronic kidney disease, stage 2 (mild): Secondary | ICD-10-CM | POA: Diagnosis not present

## 2020-06-09 DIAGNOSIS — N182 Chronic kidney disease, stage 2 (mild): Secondary | ICD-10-CM | POA: Diagnosis not present

## 2020-06-09 DIAGNOSIS — G7000 Myasthenia gravis without (acute) exacerbation: Secondary | ICD-10-CM | POA: Diagnosis not present

## 2020-06-09 DIAGNOSIS — I129 Hypertensive chronic kidney disease with stage 1 through stage 4 chronic kidney disease, or unspecified chronic kidney disease: Secondary | ICD-10-CM | POA: Diagnosis not present

## 2020-06-09 DIAGNOSIS — R1310 Dysphagia, unspecified: Secondary | ICD-10-CM | POA: Diagnosis not present

## 2020-06-09 DIAGNOSIS — D7589 Other specified diseases of blood and blood-forming organs: Secondary | ICD-10-CM | POA: Diagnosis not present

## 2020-06-09 DIAGNOSIS — I6529 Occlusion and stenosis of unspecified carotid artery: Secondary | ICD-10-CM | POA: Diagnosis not present

## 2020-06-10 DIAGNOSIS — I129 Hypertensive chronic kidney disease with stage 1 through stage 4 chronic kidney disease, or unspecified chronic kidney disease: Secondary | ICD-10-CM | POA: Diagnosis not present

## 2020-06-10 DIAGNOSIS — G7000 Myasthenia gravis without (acute) exacerbation: Secondary | ICD-10-CM | POA: Diagnosis not present

## 2020-06-10 DIAGNOSIS — D7589 Other specified diseases of blood and blood-forming organs: Secondary | ICD-10-CM | POA: Diagnosis not present

## 2020-06-10 DIAGNOSIS — N182 Chronic kidney disease, stage 2 (mild): Secondary | ICD-10-CM | POA: Diagnosis not present

## 2020-06-10 DIAGNOSIS — R1310 Dysphagia, unspecified: Secondary | ICD-10-CM | POA: Diagnosis not present

## 2020-06-10 DIAGNOSIS — I6529 Occlusion and stenosis of unspecified carotid artery: Secondary | ICD-10-CM | POA: Diagnosis not present

## 2020-06-15 ENCOUNTER — Telehealth: Payer: Self-pay | Admitting: Neurology

## 2020-06-15 DIAGNOSIS — D7589 Other specified diseases of blood and blood-forming organs: Secondary | ICD-10-CM | POA: Diagnosis not present

## 2020-06-15 DIAGNOSIS — N182 Chronic kidney disease, stage 2 (mild): Secondary | ICD-10-CM | POA: Diagnosis not present

## 2020-06-15 DIAGNOSIS — I129 Hypertensive chronic kidney disease with stage 1 through stage 4 chronic kidney disease, or unspecified chronic kidney disease: Secondary | ICD-10-CM | POA: Diagnosis not present

## 2020-06-15 DIAGNOSIS — G7000 Myasthenia gravis without (acute) exacerbation: Secondary | ICD-10-CM | POA: Diagnosis not present

## 2020-06-15 DIAGNOSIS — I6529 Occlusion and stenosis of unspecified carotid artery: Secondary | ICD-10-CM | POA: Diagnosis not present

## 2020-06-15 DIAGNOSIS — R1310 Dysphagia, unspecified: Secondary | ICD-10-CM | POA: Diagnosis not present

## 2020-06-15 NOTE — Telephone Encounter (Signed)
Patient's daughter, Shirlean Mylar, stated Dr. Serita Grit provider number is missing from the form. She requests the provider number be sent to her through [facility]

## 2020-06-16 DIAGNOSIS — I6529 Occlusion and stenosis of unspecified carotid artery: Secondary | ICD-10-CM | POA: Diagnosis not present

## 2020-06-16 DIAGNOSIS — D7589 Other specified diseases of blood and blood-forming organs: Secondary | ICD-10-CM | POA: Diagnosis not present

## 2020-06-16 DIAGNOSIS — G7000 Myasthenia gravis without (acute) exacerbation: Secondary | ICD-10-CM | POA: Diagnosis not present

## 2020-06-16 DIAGNOSIS — N182 Chronic kidney disease, stage 2 (mild): Secondary | ICD-10-CM | POA: Diagnosis not present

## 2020-06-16 DIAGNOSIS — R1310 Dysphagia, unspecified: Secondary | ICD-10-CM | POA: Diagnosis not present

## 2020-06-16 DIAGNOSIS — I129 Hypertensive chronic kidney disease with stage 1 through stage 4 chronic kidney disease, or unspecified chronic kidney disease: Secondary | ICD-10-CM | POA: Diagnosis not present

## 2020-06-17 DIAGNOSIS — Z9181 History of falling: Secondary | ICD-10-CM | POA: Diagnosis not present

## 2020-06-17 DIAGNOSIS — Z7951 Long term (current) use of inhaled steroids: Secondary | ICD-10-CM | POA: Diagnosis not present

## 2020-06-17 DIAGNOSIS — N182 Chronic kidney disease, stage 2 (mild): Secondary | ICD-10-CM | POA: Diagnosis not present

## 2020-06-17 DIAGNOSIS — Z955 Presence of coronary angioplasty implant and graft: Secondary | ICD-10-CM | POA: Diagnosis not present

## 2020-06-17 DIAGNOSIS — D7589 Other specified diseases of blood and blood-forming organs: Secondary | ICD-10-CM | POA: Diagnosis not present

## 2020-06-17 DIAGNOSIS — I2510 Atherosclerotic heart disease of native coronary artery without angina pectoris: Secondary | ICD-10-CM | POA: Diagnosis not present

## 2020-06-17 DIAGNOSIS — Z7902 Long term (current) use of antithrombotics/antiplatelets: Secondary | ICD-10-CM | POA: Diagnosis not present

## 2020-06-17 DIAGNOSIS — I10 Essential (primary) hypertension: Secondary | ICD-10-CM | POA: Diagnosis not present

## 2020-06-17 DIAGNOSIS — N400 Enlarged prostate without lower urinary tract symptoms: Secondary | ICD-10-CM | POA: Diagnosis not present

## 2020-06-17 DIAGNOSIS — R1310 Dysphagia, unspecified: Secondary | ICD-10-CM | POA: Diagnosis not present

## 2020-06-17 DIAGNOSIS — I495 Sick sinus syndrome: Secondary | ICD-10-CM | POA: Diagnosis not present

## 2020-06-17 DIAGNOSIS — G7000 Myasthenia gravis without (acute) exacerbation: Secondary | ICD-10-CM | POA: Diagnosis not present

## 2020-06-17 DIAGNOSIS — E785 Hyperlipidemia, unspecified: Secondary | ICD-10-CM | POA: Diagnosis not present

## 2020-06-17 DIAGNOSIS — I6529 Occlusion and stenosis of unspecified carotid artery: Secondary | ICD-10-CM | POA: Diagnosis not present

## 2020-06-17 DIAGNOSIS — Z950 Presence of cardiac pacemaker: Secondary | ICD-10-CM | POA: Diagnosis not present

## 2020-06-17 DIAGNOSIS — I129 Hypertensive chronic kidney disease with stage 1 through stage 4 chronic kidney disease, or unspecified chronic kidney disease: Secondary | ICD-10-CM | POA: Diagnosis not present

## 2020-06-23 DIAGNOSIS — G7000 Myasthenia gravis without (acute) exacerbation: Secondary | ICD-10-CM | POA: Diagnosis not present

## 2020-06-23 DIAGNOSIS — D7589 Other specified diseases of blood and blood-forming organs: Secondary | ICD-10-CM | POA: Diagnosis not present

## 2020-06-23 DIAGNOSIS — R1310 Dysphagia, unspecified: Secondary | ICD-10-CM | POA: Diagnosis not present

## 2020-06-23 DIAGNOSIS — N182 Chronic kidney disease, stage 2 (mild): Secondary | ICD-10-CM | POA: Diagnosis not present

## 2020-06-23 DIAGNOSIS — I6529 Occlusion and stenosis of unspecified carotid artery: Secondary | ICD-10-CM | POA: Diagnosis not present

## 2020-06-23 DIAGNOSIS — I129 Hypertensive chronic kidney disease with stage 1 through stage 4 chronic kidney disease, or unspecified chronic kidney disease: Secondary | ICD-10-CM | POA: Diagnosis not present

## 2020-06-24 DIAGNOSIS — G7000 Myasthenia gravis without (acute) exacerbation: Secondary | ICD-10-CM | POA: Diagnosis not present

## 2020-06-24 DIAGNOSIS — Z23 Encounter for immunization: Secondary | ICD-10-CM | POA: Diagnosis not present

## 2020-06-24 DIAGNOSIS — I10 Essential (primary) hypertension: Secondary | ICD-10-CM | POA: Diagnosis not present

## 2020-06-24 DIAGNOSIS — N400 Enlarged prostate without lower urinary tract symptoms: Secondary | ICD-10-CM | POA: Diagnosis not present

## 2020-06-24 DIAGNOSIS — E7800 Pure hypercholesterolemia, unspecified: Secondary | ICD-10-CM | POA: Diagnosis not present

## 2020-06-25 DIAGNOSIS — G7000 Myasthenia gravis without (acute) exacerbation: Secondary | ICD-10-CM | POA: Diagnosis not present

## 2020-06-25 DIAGNOSIS — I6529 Occlusion and stenosis of unspecified carotid artery: Secondary | ICD-10-CM | POA: Diagnosis not present

## 2020-06-25 DIAGNOSIS — I129 Hypertensive chronic kidney disease with stage 1 through stage 4 chronic kidney disease, or unspecified chronic kidney disease: Secondary | ICD-10-CM | POA: Diagnosis not present

## 2020-06-25 DIAGNOSIS — N182 Chronic kidney disease, stage 2 (mild): Secondary | ICD-10-CM | POA: Diagnosis not present

## 2020-06-25 DIAGNOSIS — D7589 Other specified diseases of blood and blood-forming organs: Secondary | ICD-10-CM | POA: Diagnosis not present

## 2020-06-25 DIAGNOSIS — R1310 Dysphagia, unspecified: Secondary | ICD-10-CM | POA: Diagnosis not present

## 2020-06-28 ENCOUNTER — Other Ambulatory Visit: Payer: Self-pay | Admitting: Internal Medicine

## 2020-06-29 DIAGNOSIS — G7000 Myasthenia gravis without (acute) exacerbation: Secondary | ICD-10-CM | POA: Diagnosis not present

## 2020-06-29 DIAGNOSIS — D7589 Other specified diseases of blood and blood-forming organs: Secondary | ICD-10-CM | POA: Diagnosis not present

## 2020-06-29 DIAGNOSIS — R1310 Dysphagia, unspecified: Secondary | ICD-10-CM | POA: Diagnosis not present

## 2020-06-29 DIAGNOSIS — I6529 Occlusion and stenosis of unspecified carotid artery: Secondary | ICD-10-CM | POA: Diagnosis not present

## 2020-06-29 DIAGNOSIS — N182 Chronic kidney disease, stage 2 (mild): Secondary | ICD-10-CM | POA: Diagnosis not present

## 2020-06-29 DIAGNOSIS — I129 Hypertensive chronic kidney disease with stage 1 through stage 4 chronic kidney disease, or unspecified chronic kidney disease: Secondary | ICD-10-CM | POA: Diagnosis not present

## 2020-07-01 ENCOUNTER — Encounter: Payer: Medicare Other | Admitting: Nurse Practitioner

## 2020-07-02 DIAGNOSIS — D7589 Other specified diseases of blood and blood-forming organs: Secondary | ICD-10-CM | POA: Diagnosis not present

## 2020-07-02 DIAGNOSIS — I6529 Occlusion and stenosis of unspecified carotid artery: Secondary | ICD-10-CM | POA: Diagnosis not present

## 2020-07-02 DIAGNOSIS — R1310 Dysphagia, unspecified: Secondary | ICD-10-CM | POA: Diagnosis not present

## 2020-07-02 DIAGNOSIS — I129 Hypertensive chronic kidney disease with stage 1 through stage 4 chronic kidney disease, or unspecified chronic kidney disease: Secondary | ICD-10-CM | POA: Diagnosis not present

## 2020-07-02 DIAGNOSIS — G7000 Myasthenia gravis without (acute) exacerbation: Secondary | ICD-10-CM | POA: Diagnosis not present

## 2020-07-02 DIAGNOSIS — N182 Chronic kidney disease, stage 2 (mild): Secondary | ICD-10-CM | POA: Diagnosis not present

## 2020-07-07 DIAGNOSIS — I129 Hypertensive chronic kidney disease with stage 1 through stage 4 chronic kidney disease, or unspecified chronic kidney disease: Secondary | ICD-10-CM | POA: Diagnosis not present

## 2020-07-07 DIAGNOSIS — R1310 Dysphagia, unspecified: Secondary | ICD-10-CM | POA: Diagnosis not present

## 2020-07-07 DIAGNOSIS — G7000 Myasthenia gravis without (acute) exacerbation: Secondary | ICD-10-CM | POA: Diagnosis not present

## 2020-07-07 DIAGNOSIS — I6529 Occlusion and stenosis of unspecified carotid artery: Secondary | ICD-10-CM | POA: Diagnosis not present

## 2020-07-07 DIAGNOSIS — N182 Chronic kidney disease, stage 2 (mild): Secondary | ICD-10-CM | POA: Diagnosis not present

## 2020-07-07 DIAGNOSIS — D7589 Other specified diseases of blood and blood-forming organs: Secondary | ICD-10-CM | POA: Diagnosis not present

## 2020-07-08 DIAGNOSIS — D7589 Other specified diseases of blood and blood-forming organs: Secondary | ICD-10-CM | POA: Diagnosis not present

## 2020-07-08 DIAGNOSIS — R1310 Dysphagia, unspecified: Secondary | ICD-10-CM | POA: Diagnosis not present

## 2020-07-08 DIAGNOSIS — I129 Hypertensive chronic kidney disease with stage 1 through stage 4 chronic kidney disease, or unspecified chronic kidney disease: Secondary | ICD-10-CM | POA: Diagnosis not present

## 2020-07-08 DIAGNOSIS — G7000 Myasthenia gravis without (acute) exacerbation: Secondary | ICD-10-CM | POA: Diagnosis not present

## 2020-07-08 DIAGNOSIS — N182 Chronic kidney disease, stage 2 (mild): Secondary | ICD-10-CM | POA: Diagnosis not present

## 2020-07-08 DIAGNOSIS — I6529 Occlusion and stenosis of unspecified carotid artery: Secondary | ICD-10-CM | POA: Diagnosis not present

## 2020-07-09 ENCOUNTER — Other Ambulatory Visit: Payer: Self-pay

## 2020-07-09 ENCOUNTER — Ambulatory Visit (INDEPENDENT_AMBULATORY_CARE_PROVIDER_SITE_OTHER): Payer: Medicare Other | Admitting: Physician Assistant

## 2020-07-09 VITALS — BP 124/60 | HR 83 | Ht 70.0 in | Wt 153.0 lb

## 2020-07-09 DIAGNOSIS — I10 Essential (primary) hypertension: Secondary | ICD-10-CM

## 2020-07-09 DIAGNOSIS — I2510 Atherosclerotic heart disease of native coronary artery without angina pectoris: Secondary | ICD-10-CM

## 2020-07-09 DIAGNOSIS — I6523 Occlusion and stenosis of bilateral carotid arteries: Secondary | ICD-10-CM | POA: Diagnosis not present

## 2020-07-09 DIAGNOSIS — Z950 Presence of cardiac pacemaker: Secondary | ICD-10-CM

## 2020-07-09 DIAGNOSIS — I495 Sick sinus syndrome: Secondary | ICD-10-CM

## 2020-07-09 NOTE — Progress Notes (Signed)
Cardiology Office Note Date:  07/09/2020  Patient ID:  [name] October 14, 1930, MRN [id number] PCP:  Shirline Frees, MD  Cardiologist:  Dr. Harrington Challenger Electrophysiologist: Dr. Rayann Heman   ,  Chief Complaint: annual visit  History of Present Illness: [name] is a 86 y.o. male with history of CAD (2009:  90 to 95% stenosis prox RCA; 70 to 80% distal LAD 50% D1; 60 to 70% D2; 40% LCx 80% OM1, s/p rotational atherectomy and DES to RCA in 2009), SSSx w/PPM, HTN, known solitary kidney, HLD.  He comes in today to be seen for Dr. Rayann Heman, last seen by him Oct 2020 via telehealth visit.  Had a broken arm 2/2 a mechanical fall.  Recommended to increase his lasix for a couple weeks then resume usual dosing for edema. Device remotes OK with est 2 years to ERI, planned for device clinic in 84mo, and APP annual visit  He saw Dr. Harrington Challenger March 2021, discussed edema and planned to update his echo with thoughts towards titrating his lasix and planned to see again November.  Echo looked OK, no changes were made, recommended salt reduction.  Most recently, discharged for the hospital 05/15/2020, there with dysphagia, rait instability, fall, neck weakness Neurology w/u started with suspicion of Myasthenia Gravis.  Had negative CT head (unable to do MRI) F/u with neurology last month still with slurred speech, , no ongoing neck weakness, tolerating pureed and meds. Seems diagnosis was confirmed, Increased his mestinon and started process for  PA for IVIG 2g/kg over 3-5 days for home infusion.    TODAY He comes today accompanied by [name] long time friend who helps him a few days a week as well. He denies any kind of CP, palpitations or cardiac awareness. He is getting the infusions for the Myasthenia and  during this regime they have stopped his lasix and K+, he remains on spironolactone.  He anticipates finishing in April He denies any dizzy spells, no near syncope or syncope.   He has lost quite a  bit of weight over the year, he admits to not much of an appetite, his neurology team is assisting him with this.    Device information SJM dual chamber PPM implanted 07/10/2008   Past Medical History:  Diagnosis Date  . Carotid artery disease ([place])   . Coronary artery disease    PCI RCA 2009  . Dyslipidemia   . Hypertension   . Scleritis, unspecified    treated with prednisone  . Seizure [facility])    felt due to cyclosporin in setting of electrolyte disorder  . Solitary kidney   . Symptomatic bradycardia    a. s/p STJ dual chamber pacemaker    Past Surgical History:  Procedure Laterality Date  . CARDIAC CATHETERIZATION     12 years ago?  . COLONOSCOPY    . EYE SURGERY     right eye about 10 years ago  . ORIF HUMERUS FRACTURE Right 07/03/2019   Procedure: Right distal humerus open reduction, internal fixation;  Surgeon: Verner Mould, MD;  Location: [facility];  Service: Orthopedics;  Laterality: Right;  . PACEMAKER INSERTION  2009   STJ dual chamber pacemaker implanted by Dr Olevia Perches for symptomatic bradycardia    Current Outpatient Medications  Medication Sig Dispense Refill  . acetaminophen (TYLENOL) 325 MG tablet Take 650 mg by mouth every 6 (six) hours as needed for mild pain.    Marland Kitchen amLODipine (NORVASC) 5 MG tablet Take 1 tablet  by mouth once daily 90 tablet 2  . aspirin 81 MG tablet Take 81 mg by mouth daily.     . beta carotene w/minerals (OCUVITE) tablet Take 1 tablet by mouth daily.    . clopidogrel (PLAVIX) 75 MG tablet Take 1 tablet by mouth once daily 90 tablet 1  . finasteride (PROSCAR) 5 MG tablet Take 5 mg by mouth daily.    . fluticasone (FLONASE) 50 MCG/ACT nasal spray Place 1 spray into both nostrils daily as needed for allergies or rhinitis.    . furosemide (LASIX) 40 MG tablet Take 1 tablet (40 mg total) by mouth 2 (two) times a week. (Patient taking differently: Take 40 mg by mouth See admin instructions. Take one tablet by mouth on Mondays and Fridays)  15 tablet 11  . Multiple Vitamins-Minerals (CENTRUM SILVER PO) Take 1 capsule by mouth daily.     Marland Kitchen NITROSTAT 0.4 MG SL tablet DISSOLVE ONE TABLET UNDER THE TONGUE EVERY 5 MINUTES AS NEEDED FOR CHEST PAIN.  DO NOT EXCEED A TOTAL OF 3 DOSES IN 15 MINUTES (Patient taking differently: Place 0.4 mg under the tongue every 5 (five) minutes as needed for chest pain. ) 75 tablet 2  . potassium chloride (KLOR-CON) 10 MEQ tablet Take 1 tablet (10 mEq total) by mouth 2 (two) times a week. Take with furosemide 15 tablet 11  . pyridostigmine (MESTINON) 60 MG tablet Take 1 tablet at 7am, noon, and 5pm 270 tablet 3  . spironolactone (ALDACTONE) 25 MG tablet Take 1/2 (one-half) tablet by mouth once daily (Patient taking differently: Take 12.5 mg by mouth daily. ) 45 tablet 2   No current facility-administered medications for this visit.    Allergies:   Cyclosporine, Maxzide [triamterene-hctz], Sulfa antibiotics, Sulfur, and Prednisone   Social History:  The patient  reports that he has never smoked. He has never used smokeless tobacco. He reports current alcohol use. He reports that he does not use drugs.   Family History:  The patient's family history includes Alcohol abuse in his son; Arthritis/Rheumatoid in his mother; COPD in his father; Heart disease in his mother.  ROS:  Please see the history of present illness.    All other systems are reviewed and otherwise negative.   PHYSICAL EXAM:  VS:  There were no vitals taken for this visit. BMI: There is no height or weight on file to calculate BMI. Well nourished, well developed, in no acute distress HEENT: normocephalic, atraumatic Neck: no JVD, carotid bruits or masses Cardiac:  RRR; no significant murmurs, no rubs, or gallops Lungs:  CTA b/l, no wheezing, rhonchi or rales Abd: soft, nontender MS: no deformity, age appropriate, perhaps advanced atrophy Ext: no edema, chronic looking skin changes Skin: warm and dry, no rash Neuro:  No gross deficits  appreciated Psych: euthymic mood, full affect  PPM site is stable, no tethering or discomfort   EKG:  Not done today   Device interrogation done today and reviewed by myself:  Battery estimate 2.25-4.5 years Lead measurements are all stable Noise is noted on the AL lead with arm movements. 6.1% AMS, arrhythmia log events available for review are all only seconds in duration I se mention of noise on the A lead that goes back to 2017 AP 32% (looks stable as well) VP 2.0%   12/22/2019; TTE IMPRESSIONS  1. Left ventricular ejection fraction, by estimation, is 60 to 65%. The  left ventricle has normal function. The left ventricle has no regional  wall motion  abnormalities. Left ventricular diastolic parameters are  indeterminate.  2. Right ventricular systolic function is normal. The right ventricular  size is normal. There is moderately elevated pulmonary artery systolic  pressure.  3. Left atrial size was severely dilated.  4. Right atrial size was mildly dilated.  5. The mitral valve is normal in structure. No evidence of mitral valve  regurgitation. No evidence of mitral stenosis.  6. The aortic valve is normal in structure. Aortic valve regurgitation is  trivial. No aortic stenosis is present.  7. Aortic dilatation noted. There is mild to moderate dilatation of the  ascending aorta measuring 43 mm.    Recent Labs: 07/18/2019: NT-Pro BNP 156 05/12/2020: ALT 25; TSH 1.734 05/13/2020: Hemoglobin 13.5; Platelets 162 05/14/2020: BUN 23; Creatinine, Ser 1.04; Potassium 3.6; Sodium 135  05/13/2020: Cholesterol 138; HDL 53; LDL Cholesterol 70; Total CHOL/HDL Ratio 2.6; Triglycerides 77; VLDL 15   CrCl cannot be calculated (Patient's most recent lab result is older than the maximum 21 days allowed.).   Wt Readings from Last 3 Encounters:  05/20/20 162 lb 12.8 oz (73.8 kg)  05/13/20 167 lb (75.8 kg)  12/04/19 186 lb (84.4 kg)     Other studies reviewed: Additional  studies/records reviewed today include: summarized above  ASSESSMENT AND PLAN:  1. PPM     Known A lead noise, mpedance, sensing and threshold stable     No programming changes made  assume AMS are false events, no EGMs, all very short durations  2. CAD     No anginal complaints     On ASA, plavix     No longer on statin, stopped at his hospital stay, will defer to Dr. Harrington Challenger, neurology team  3. HTN     Looks good, no changes  4. Edema     None noted today     Diuretics as discussed above    Disposition: F/u with device clinic in 63mo, EP MD/APP in a year, sooner if needed   Current medicines are reviewed at length with the patient today.  The patient did not have any concerns regarding medicines.  Venetia Night, PA-C 07/09/2020 5:27 AM     [facility] [address] [place]  [place] [phone] (office)  [phone] (fax)

## 2020-07-09 NOTE — Patient Instructions (Addendum)
Medication Instructions:   Your physician recommends that you continue on your current medications as directed. Please refer to the Current Medication list given to you today.   *If you need a refill on your cardiac medications before your next appointment, please call your pharmacy*   Lab Work: NONE ORDERED  TODAY   If you have labs (blood work) drawn today and your tests are completely normal, you will receive your results only by: MyChart Message (if you have MyChart) OR A paper copy in the mail If you have any lab test that is abnormal or we need to change your treatment, we will call you to review the results.   Testing/Procedures: NONE ORDERED  TODAY     Follow-Up: At [facility], you and your health needs are our priority.  As part of our continuing mission to provide you with exceptional heart care, we have created designated Provider Care Teams.  These Care Teams include your primary Cardiologist (physician) and Advanced Practice Providers (APPs -  Physician Assistants and Nurse Practitioners) who all work together to provide you with the care you need, when you need it.  We recommend signing up for the patient portal called "MyChart".  Sign up information is provided on this After Visit Summary.  MyChart is used to connect with patients for Virtual Visits (Telemedicine).  Patients are able to view lab/test results, encounter notes, upcoming appointments, etc.  Non-urgent messages can be sent to your provider as well.   To learn more about what you can do with MyChart, go to [email or web address].    Your next appointment:   6 month(s)  The format for your next appointment:   In Person  Provider:   Device Clinic    Other Instructions  

## 2020-07-16 ENCOUNTER — Ambulatory Visit (INDEPENDENT_AMBULATORY_CARE_PROVIDER_SITE_OTHER): Payer: Medicare Other | Admitting: Neurology

## 2020-07-16 ENCOUNTER — Other Ambulatory Visit: Payer: Self-pay

## 2020-07-16 ENCOUNTER — Encounter: Payer: Self-pay | Admitting: Neurology

## 2020-07-16 VITALS — BP 161/82 | HR 80 | Ht 70.0 in | Wt 157.0 lb

## 2020-07-16 DIAGNOSIS — I6523 Occlusion and stenosis of bilateral carotid arteries: Secondary | ICD-10-CM | POA: Diagnosis not present

## 2020-07-16 DIAGNOSIS — G7001 Myasthenia gravis with (acute) exacerbation: Secondary | ICD-10-CM

## 2020-07-16 DIAGNOSIS — D7589 Other specified diseases of blood and blood-forming organs: Secondary | ICD-10-CM | POA: Diagnosis not present

## 2020-07-16 DIAGNOSIS — R1310 Dysphagia, unspecified: Secondary | ICD-10-CM | POA: Diagnosis not present

## 2020-07-16 DIAGNOSIS — I129 Hypertensive chronic kidney disease with stage 1 through stage 4 chronic kidney disease, or unspecified chronic kidney disease: Secondary | ICD-10-CM | POA: Diagnosis not present

## 2020-07-16 DIAGNOSIS — G7000 Myasthenia gravis without (acute) exacerbation: Secondary | ICD-10-CM | POA: Diagnosis not present

## 2020-07-16 DIAGNOSIS — N182 Chronic kidney disease, stage 2 (mild): Secondary | ICD-10-CM | POA: Diagnosis not present

## 2020-07-16 DIAGNOSIS — I6529 Occlusion and stenosis of unspecified carotid artery: Secondary | ICD-10-CM | POA: Diagnosis not present

## 2020-07-16 MED ORDER — PYRIDOSTIGMINE BROMIDE 60 MG PO TABS: ORAL_TABLET | ORAL | 3 refills | Status: AC

## 2020-07-16 NOTE — Progress Notes (Signed)
Follow-up Visit   Date: 07/16/20   [name] MRN: [id number] DOB: 09-27-1930   Interim History: [name] is a 86 y.o. right-handed Caucasian male with CAD, hypertension, bradycardia s/p PPM returning to the clinic for follow-up of seropositive myasthenia gravis.  The patient was accompanied to the clinic by daughter who also provides collateral information.    History of present illness: Starting around early August, he began having difficulty swallowing both solids and liquids, sometimes having choking spells.  Symptoms progressed into slurred speech, neck heaviness, and generalized weakness.  He suffered a fall at home and was unable to stand up so called his daughter who was unable to stand him up.  She called EMS who brought him to [facility].  CT head did not show stroke.  Unable to get MRI due to PPM.  He was seen by Dr. Earnestine Leys who had high clinical suspicion for myasthenia and started him on mestinon 30mg .  Within an 1.5h, he noticed that his eyelids opened and speech improved.  He takes mestinon 7am, 1pm, and 7pm. Patient felt much better and did not wish to stay in the hospital for IVIG and was discharged home. AChR pending. Since being home, he continues to feel better.  He is on a pureed diet and is tolerating it well.  He denies problems with taking his morning medications.  His speech is still slurred.  No double vision or droopiness of the eyelids.   He lives alone in a one-level apartment.  He was walking unassisted prior to hospitalization. At baseline, his is very active and does his own ADLs and IADLs. He is hoping to travel to [place] in January.   UPDATE 07/16/2020:  He is here for follow-up visit.  Since getting IVIG, he is doing much better with respect to swallowing and generalized weakness.  He continues to have slurred speech, especially after talking a lot.  No double vision or droopiness of the eyelids, or neck heaviness.  He takes mestinon 60mg  three  times daily and begins to notice that it wears off within 3 hours.  He is planning on traveling to [place] in January, a friend would drive him.  He was driving prior to August, but had not driven since then and is asking whether he can resume driving.    Daughter has mentioned concern with impulsive behavior, such as wanting certain things.  He continues to live independently and manages his own finances, medications, household chores.  He has a caregiver come for part of the day.  No concerns with him skipping or missing medications, but they are not 100% sure since no one is there in the morning.    Medications:  Current Outpatient Medications on File Prior to Visit  Medication Sig Dispense Refill  . acetaminophen (TYLENOL) 325 MG tablet Take 650 mg by mouth every 6 (six) hours as needed for mild pain.    Marland Kitchen amLODipine (NORVASC) 5 MG tablet Take 1 tablet by mouth once daily 90 tablet 2  . aspirin 81 MG tablet Take 81 mg by mouth daily.     . beta carotene w/minerals (OCUVITE) tablet Take 1 tablet by mouth daily.     . clopidogrel (PLAVIX) 75 MG tablet Take 1 tablet by mouth once daily 90 tablet 1  . finasteride (PROSCAR) 5 MG tablet Take 5 mg by mouth daily.    . fluticasone (FLONASE) 50 MCG/ACT nasal spray Place 1 spray into both nostrils daily as needed for  allergies or rhinitis.    . Multiple Vitamins-Minerals (CENTRUM SILVER PO) Take 1 capsule by mouth daily.     Marland Kitchen NITROSTAT 0.4 MG SL tablet DISSOLVE ONE TABLET UNDER THE TONGUE EVERY 5 MINUTES AS NEEDED FOR CHEST PAIN.  DO NOT EXCEED A TOTAL OF 3 DOSES IN 15 MINUTES (Patient taking differently: Place 0.4 mg under the tongue every 5 (five) minutes as needed for chest pain. ) 75 tablet 2  . pyridostigmine (MESTINON) 60 MG tablet Take 1 tablet at 7am, noon, and 5pm 270 tablet 3  . spironolactone (ALDACTONE) 25 MG tablet Take 1/2 (one-half) tablet by mouth once daily (Patient taking differently: Take 12.5 mg by mouth daily. ) 45 tablet 2  .  furosemide (LASIX) 40 MG tablet Take 1 tablet (40 mg total) by mouth 2 (two) times a week. (Patient not taking: Reported on 07/16/2020) 15 tablet 11   No current facility-administered medications on file prior to visit.    Allergies:  Allergies  Allergen Reactions  . Cyclosporine     unknown  . Maxzide [Triamterene-Hctz] Other (See Comments)    Adverse reaction-bloating and abdominal pain  . Sulfa Antibiotics   . Sulfur Other (See Comments)    No energy, could not walk   . Prednisone Other (See Comments)    Can tolerate for a short period of time Unknown reaction    Vital Signs:  BP (!) 161/82   Pulse 80   Ht 5\' 10"  (1.778 m)   Wt 157 lb (71.2 kg)   SpO2 94%   BMI 22.53 kg/m   Neurological Exam: MENTAL STATUS including orientation to time, place, person, recent and remote memory, attention span and concentration, language, and fund of knowledge is fairly intact.  Speech is mildly dysarthric, worse with prolonged talking.   [employer] Mental Exam 07/16/2020  Weekday Correct 1  Current year 1  What state are we in? 1  Amount spent 1  Amount left 2  # of Animals 2  5 objects recall 4  Number series 1  Hour markers 0  Time correct 0  Placed X in triangle correctly 1  Largest Figure 1  Name of male 2  Date back to work 2  Type of work 0  State she lived in 2  Total score 21    CRANIAL NERVES:  No visual field defects.  Pupils equal round and reactive to light.  Normal conjugate, extra-ocular eye movements in all directions of gaze.  No ptosis.  Face is symmetric. Palate elevates symmetrically.  Tongue is midline.  Oribicularis oculi 5/5, buccinator and tongue strength is 4+/5  MOTOR:  Motor strength is 5/5 in all extremities, no fatigability.  Neck strength is 5/5 No atrophy, fasciculations or abnormal movements.  No pronator drift.  Tone is normal.    COORDINATION/GAIT:  Gait not tested  Data: Labs 05/13/2020:  AChR binding 61*  IMPRESSION/PLAN: 1.  Seropositive myasthenia gravis with exacerbation manifesting with dysarthria - no diplopia, dysphagia, or limb weakness  - Increase mestinon to 60mg  four times daily at 7am, 10am, 1pm, and 5pm  - Continue IVIG 1g/kg every 28 days  - Discussed adding prednisone going forward, if he remains symptomatic  - I do not recommend travel until symptoms are better controlled  2. Mild neurocognitive impairment  - SLUMS indicates diffuse pattern of cognitive deficits, although it remains largely independent at home  - Recommend driving evaluation to assess roadside safety, no driving until then  - Consider formal neurocognitive  testing going forward  Return to clinic in 1 month.    Thank you for allowing me to participate in patient's care.  If I can answer any additional questions, I would be pleased to do so.    Sincerely,    Jaisha Villacres K. Posey Pronto, DO

## 2020-07-16 NOTE — Patient Instructions (Addendum)
Recommend driving evaluation  Increase mestinon 60mg  to four times daily at 7am, 10am, 1pm, and 5pm  Continue IVIG   Return to on December 10th at 9:30a.  Please arrive 15 minutes prior to appointment.

## 2020-07-17 ENCOUNTER — Other Ambulatory Visit: Payer: Self-pay | Admitting: Internal Medicine

## 2020-07-19 DIAGNOSIS — Z23 Encounter for immunization: Secondary | ICD-10-CM | POA: Diagnosis not present

## 2020-08-02 ENCOUNTER — Telehealth: Payer: Self-pay | Admitting: Neurology

## 2020-08-02 ENCOUNTER — Encounter: Payer: Self-pay | Admitting: Internal Medicine

## 2020-08-02 ENCOUNTER — Other Ambulatory Visit: Payer: Self-pay

## 2020-08-02 ENCOUNTER — Ambulatory Visit (INDEPENDENT_AMBULATORY_CARE_PROVIDER_SITE_OTHER): Payer: Medicare Other | Admitting: Internal Medicine

## 2020-08-02 VITALS — BP 120/72 | HR 80 | Ht 70.5 in | Wt 153.0 lb

## 2020-08-02 DIAGNOSIS — E782 Mixed hyperlipidemia: Secondary | ICD-10-CM

## 2020-08-02 DIAGNOSIS — G7001 Myasthenia gravis with (acute) exacerbation: Secondary | ICD-10-CM

## 2020-08-02 DIAGNOSIS — I2510 Atherosclerotic heart disease of native coronary artery without angina pectoris: Secondary | ICD-10-CM

## 2020-08-02 DIAGNOSIS — I10 Essential (primary) hypertension: Secondary | ICD-10-CM

## 2020-08-02 DIAGNOSIS — I6523 Occlusion and stenosis of bilateral carotid arteries: Secondary | ICD-10-CM | POA: Diagnosis not present

## 2020-08-02 NOTE — Telephone Encounter (Signed)
Patient's daughter called and said her father needs a referral to [facility] at [facility]. He has an appointment on 08/18/20 already but needs a referral sent as well.  [name]  Fax: [phone]

## 2020-08-02 NOTE — Progress Notes (Signed)
Cardiology Office Note   Date:  08/02/2020   ID:  [name], DOB 1931/01/27, MRN [id number]  PCP:  Shirline Frees, MD  Cardiologist:   Dorris Carnes, MD   F/U of CAD   History of Present Illness: [name] is a 86 y.o. male with a history of CAD (2009:  90 to 95% stenosis prox RCA; 70 to 80% distal LAD 50% D1; 60 to 70% D2; 40% LCx 80% OM1)  He is s/p rotational atherectomy and DES to RCA in 2009, SSS s/p PPM in 2009, hypertension, hyperlipidemia, and solitary kidney. .  I saw the patient in March 2021  She was seen by Emogene Morgan earlier this fall. In August 2021 he was admitted with dysphagia, slurring of speech, weakness, balance problems.  Working diagnosis was myasthenia gravis. patient denies chest pain.  He says his breathing is okay.  While in the hospital the patient had a swallowing study.  There was some pooling in the vallecula and slight aspiration.  Initially he was treated with was prescription for thick liquids he is not doing that now.  His nurse caregiver who is with him now had a question about this.  The patient has plans to go to [facility] for [facility] months.  Sounds like he has somebody there with him several friends.  Not sure if there will be an independent caregiver with him throughout the stay.  The caregiver today says his urine after last infusion was a little murky.  Patient denies difficulty urinating.  He denies pain with urination  Current Meds  Medication Sig  . acetaminophen (TYLENOL) 325 MG tablet Take 650 mg by mouth every 6 (six) hours as needed for mild pain.  Marland Kitchen amLODipine (NORVASC) 5 MG tablet Take 1 tablet by mouth once daily  . aspirin 81 MG tablet Take 81 mg by mouth daily.   . clopidogrel (PLAVIX) 75 MG tablet Take 1 tablet by mouth once daily  . finasteride (PROSCAR) 5 MG tablet Take 5 mg by mouth daily.  . fluticasone (FLONASE) 50 MCG/ACT nasal spray Place 1 spray into both nostrils daily as needed for allergies or rhinitis.  . Multiple  Vitamins-Minerals (CENTRUM SILVER PO) Take 1 capsule by mouth daily.   Marland Kitchen NITROSTAT 0.4 MG SL tablet DISSOLVE ONE TABLET UNDER THE TONGUE EVERY 5 MINUTES AS NEEDED FOR CHEST PAIN.  DO NOT EXCEED A TOTAL OF 3 DOSES IN 15 MINUTES (Patient taking differently: Place 0.4 mg under the tongue every 5 (five) minutes as needed for chest pain. )  . pyridostigmine (MESTINON) 60 MG tablet Take 1 tablet at 7am, 10am 1pm, and 5pm  . spironolactone (ALDACTONE) 25 MG tablet Take 0.5 tablets (12.5 mg total) by mouth daily.     Allergies:   Cyclosporine, Maxzide [triamterene-hctz], Sulfa antibiotics, Sulfur, and Prednisone   Past Medical History:  Diagnosis Date  . Carotid artery disease ([place])   . Coronary artery disease    PCI RCA 2009  . Dyslipidemia   . Hypertension   . Scleritis, unspecified    treated with prednisone  . Seizure [facility])    felt due to cyclosporin in setting of electrolyte disorder  . Solitary kidney   . Symptomatic bradycardia    a. s/p STJ dual chamber pacemaker    Past Surgical History:  Procedure Laterality Date  . CARDIAC CATHETERIZATION     12 years ago?  . COLONOSCOPY    . EYE SURGERY     right eye about  10 years ago  . ORIF HUMERUS FRACTURE Right 07/03/2019   Procedure: Right distal humerus open reduction, internal fixation;  Surgeon: Verner Mould, MD;  Location: [facility];  Service: Orthopedics;  Laterality: Right;  . PACEMAKER INSERTION  2009   STJ dual chamber pacemaker implanted by Dr Olevia Perches for symptomatic bradycardia     Social History:  The patient  reports that he has never smoked. He has never used smokeless tobacco. He reports current alcohol use. He reports that he does not use drugs.   Family History:  The patient's family history includes Alcohol abuse in his son; Arthritis/Rheumatoid in his mother; COPD in his father; Heart disease in his mother.    ROS:  Please see the history of present illness. All other systems are reviewed and  Negative to  the above problem except as noted.    PHYSICAL EXAM: VS:  BP 120/72   Pulse 80   Ht 5' 10.5" (1.791 m)   Wt 153 lb (69.4 kg)   SpO2 94%   BMI 21.64 kg/m   GEN: Thin 86 year old in no acute distress HEENT: normal  Neck:  JVP is normal  Cardiac: RRR; no murmur.  Trivial edema  Respiratory:  clear to auscultation bilaterally, normal work of breathing GI: soft, nontender, nondistended, + BS  No hepatomegaly  MS: Moving all extremities. Skin: warm and dry, no rash Neuro: Cranial nerves II through XII intact.  EKG:  EKG is not ordered today.   Lipid Panel    Component Value Date/Time   CHOL 138 05/13/2020 0644   CHOL 149 07/18/2019 1252   TRIG 77 05/13/2020 0644   HDL 53 05/13/2020 0644   HDL 65 07/18/2019 1252   CHOLHDL 2.6 05/13/2020 0644   VLDL 15 05/13/2020 0644   LDLCALC 70 05/13/2020 0644   LDLCALC 70 07/18/2019 1252      Wt Readings from Last 3 Encounters:  08/02/20 153 lb (69.4 kg)  07/16/20 157 lb (71.2 kg)  07/09/20 153 lb (69.4 kg)    Echo  12/22/19  1. Left ventricular ejection fraction, by estimation, is 60 to 65%. The left ventricle has normal function. The left ventricle has no regional wall motion abnormalities. Left ventricular diastolic parameters are indeterminate. 2. Right ventricular systolic function is normal. The right ventricular size is normal. There is moderately elevated pulmonary artery systolic pressure. 3. Left atrial size was severely dilated. 4. Right atrial size was mildly dilated. 5. The mitral valve is normal in structure. No evidence of mitral valve regurgitation. No evidence of mitral stenosis. 6. The aortic valve is normal in structure. Aortic valve regurgitation is trivial. No aortic stenosis is present. 7. Aortic dilatation noted. There is mild to moderate dilatation of the ascending aorta measuring 43 mm.  ASSESSMENT AND PLAN:  1  CAD patient denies angina.  Would follow.  Echo in April shows normal LV function.  2   Edema.  Volume status appears okay would follow.   3  HTN  BP is OK continue current meds 4  Hx sick sinus syntdrom status post pacemaker 5  HL   Continue lipitor  Last LDL 70  HDL 53   Follow  6  Urine   Will send for UA  7  Neuro  PT follows with Dr Posey Pronto   I will forward question about swallowing to her     F/U 6 months    Current medicines are reviewed at length with the patient today.  The patient  does not have concerns regarding medicines.  Signed, Dorris Carnes, MD  08/02/2020 11:26 AM    [address], [place], [place]  [address] Phone: [phone]; Fax: [phone]

## 2020-08-02 NOTE — Patient Instructions (Signed)
Medication Instructions:  No changes *If you need a refill on your cardiac medications before your next appointment, please call your pharmacy*   Lab Work: Urinalysis today  If you have labs (blood work) drawn today and your tests are completely normal, you will receive your results only by: Marland Kitchen MyChart Message (if you have MyChart) OR . A paper copy in the mail If you have any lab test that is abnormal or we need to change your treatment, we will call you to review the results.   Testing/Procedures: none   Follow-Up: At [facility], you and your health needs are our priority.  As part of our continuing mission to provide you with exceptional heart care, we have created designated Provider Care Teams.  These Care Teams include your primary Cardiologist (physician) and Advanced Practice Providers (APPs -  Physician Assistants and Nurse Practitioners) who all work together to provide you with the care you need, when you need it.   Your next appointment:   6 month(s)  The format for your next appointment:   In Person  Provider:   You may see Dorris Carnes, MD or one of the following Advanced Practice Providers on your designated Care Team:    Richardson Dopp, PA-C  Robbie Lis, [place]    Other Instructions

## 2020-08-02 NOTE — Telephone Encounter (Signed)
OK to send.

## 2020-08-03 ENCOUNTER — Telehealth: Payer: Self-pay | Admitting: Neurology

## 2020-08-03 DIAGNOSIS — I10 Essential (primary) hypertension: Secondary | ICD-10-CM | POA: Diagnosis not present

## 2020-08-03 DIAGNOSIS — E782 Mixed hyperlipidemia: Secondary | ICD-10-CM | POA: Diagnosis not present

## 2020-08-03 DIAGNOSIS — I2510 Atherosclerotic heart disease of native coronary artery without angina pectoris: Secondary | ICD-10-CM | POA: Diagnosis not present

## 2020-08-03 MED ORDER — CALCIUM CARBONATE-VITAMIN D 500-200 MG-UNIT PO TABS: 1.0000 | ORAL_TABLET | Freq: Two times a day (BID) | ORAL | 3 refills | Status: DC

## 2020-08-03 MED ORDER — PREDNISONE 10 MG PO TABS: 20.0000 mg | ORAL_TABLET | Freq: Every day | ORAL | 3 refills | Status: DC

## 2020-08-03 NOTE — Addendum Note (Signed)
Addended by: Rodman Key on: 08/03/2020 02:20 PM   Modules accepted: Orders

## 2020-08-03 NOTE — Addendum Note (Signed)
Addended by: Armen Pickup A on: 08/03/2020 09:23 AM   Modules accepted: Orders

## 2020-08-03 NOTE — Telephone Encounter (Signed)
Called and spoke to patient and he stated that his slurred speech happens in the morning and once he takes his mestinon his speech improves. Patient also informed me that he had two episodes of choking in the last 6 weeks. One incident was with water and patient states his "flapper closed up" and he couldn't breath nor swallow. Patient states that went on for a few minutes and after 5 minutes he was able to drink a whole bottle of water.   Patient was informed on recommendations per Dr. Posey Pronto and is agreeable with treatment plan. Patients daughter Shirlean Mylar requested we contact her if needed [phone]

## 2020-08-03 NOTE — Telephone Encounter (Signed)
Noted. Prednisone 20mg  sent to pharmacy along with calcium supplements.

## 2020-08-03 NOTE — Telephone Encounter (Signed)
Notified by patient's cardiologist that he is having difficulty with swallowing.  Can you contact patient and see if he is having problems with swallowing and speech?   If so, we will need to start prednisone 20mg  daily and increase IVIG 1g/kg to every 21 days.   Continue mestinon 60mg  four times daily.  Thanks.

## 2020-08-04 LAB — URINALYSIS
Bilirubin, UA: NEGATIVE
Glucose, UA: NEGATIVE
Ketones, UA: NEGATIVE
Leukocytes,UA: NEGATIVE
Nitrite, UA: NEGATIVE
RBC, UA: NEGATIVE
Specific Gravity, UA: 1.022 (ref 1.005–1.030)
Urobilinogen, Ur: 0.2 mg/dL (ref 0.2–1.0)
pH, UA: >=9 — AB (ref 5.0–7.5)

## 2020-08-05 ENCOUNTER — Other Ambulatory Visit: Payer: Self-pay

## 2020-08-05 ENCOUNTER — Ambulatory Visit (INDEPENDENT_AMBULATORY_CARE_PROVIDER_SITE_OTHER): Payer: Medicare Other | Admitting: Internal Medicine

## 2020-08-05 ENCOUNTER — Encounter: Payer: Self-pay | Admitting: Internal Medicine

## 2020-08-05 VITALS — BP 140/70 | HR 83 | Temp 97.8°F | Ht 70.5 in | Wt 154.4 lb

## 2020-08-05 DIAGNOSIS — R1313 Dysphagia, pharyngeal phase: Secondary | ICD-10-CM | POA: Diagnosis not present

## 2020-08-05 DIAGNOSIS — R0602 Shortness of breath: Secondary | ICD-10-CM | POA: Diagnosis not present

## 2020-08-05 DIAGNOSIS — R053 Chronic cough: Secondary | ICD-10-CM

## 2020-08-05 DIAGNOSIS — J690 Pneumonitis due to inhalation of food and vomit: Secondary | ICD-10-CM | POA: Diagnosis not present

## 2020-08-05 NOTE — Patient Instructions (Signed)
Call to schedule your PFTs and a following appointment with me after that.  I will re-refer you to speech therapy for your aspiration.

## 2020-08-05 NOTE — Progress Notes (Signed)
[name]    [id number]    January 13, 1931  Primary Care Physician:Harris, Gwyndolyn Saxon, MD  Referring Physician: Shirline Frees, MD [address],  [place] [address] Reason for Consultation: chronic cough, fibrosis? Date of Consultation: 08/05/2020  Chief complaint:   Chief Complaint  Patient presents with  . Consult    cough with drinking water, feels like something in his throat is not working properly.     HPI: [name] is a 86 y.o. gentleman with a history of myasthenia gravis who presents for new patient evaluation of chronic cough. No chest tightness or wheezing. He denies dyspnea with exertion but does have fatigue.  He does physical therapy at home with his caregiver and doesn't get shortness of breath.  He does get sob when laying flat at night. Sometimes laying in bed at night he has a hard time getting a big deep breath in. He has talked to his family doctor about wanting oxygen but hasn't qualified.  Patient says PT/OT and SLP came and he says SLP exercises didn't help. PT/OT was through [facility]   Was recommended to have a modified diet but has graduated this with outpatient SLP.  Thin liquids in single sips, dysphagia 1 solids (purees), alternate liquids/solids, intensive dysphagia treatment while on acute and on outpatient level. Apparently has graduated from this diet but is still having episodes of coughing with water. Mentioned this to cardiologist who recommended consideration of thickened liquids.    Social history:  Occupation: retired Exposures: lives at home independently and has a care giver coming home during the day.  Smoking history: never smoker.  Social History   Occupational History  . Occupation: retired  Tobacco Use  . Smoking status: Never Smoker  . Smokeless tobacco: Never Used  Vaping Use  . Vaping Use: Never used  Substance and Sexual Activity  . Alcohol use: Yes    Comment: rarely  . Drug use: No  .  Sexual activity: Not on file    Relevant family history: Family History  Problem Relation Age of Onset  . Arthritis/Rheumatoid Mother   . Heart disease Mother   . COPD Father   . Alcohol abuse Son     Past Medical History:  Diagnosis Date  . Carotid artery disease ([place])   . Coronary artery disease    PCI RCA 2009  . Dyslipidemia   . Hypertension   . Scleritis, unspecified    treated with prednisone  . Seizure [facility])    felt due to cyclosporin in setting of electrolyte disorder  . Solitary kidney   . Symptomatic bradycardia    a. s/p STJ dual chamber pacemaker    Past Surgical History:  Procedure Laterality Date  . CARDIAC CATHETERIZATION     12 years ago?  . COLONOSCOPY    . EYE SURGERY     right eye about 10 years ago  . ORIF HUMERUS FRACTURE Right 07/03/2019   Procedure: Right distal humerus open reduction, internal fixation;  Surgeon: Verner Mould, MD;  Location: [facility];  Service: Orthopedics;  Laterality: Right;  . PACEMAKER INSERTION  2009   STJ dual chamber pacemaker implanted by Dr Olevia Perches for symptomatic bradycardia     Physical Exam: Blood pressure 140/70, pulse 83, temperature 97.8 F (36.6 C), temperature source Temporal, height 5' 10.5" (1.791 m), weight 154 lb 6.4 oz (70 kg), SpO2 95 %. Gen:      No  acute distress, kyphosis ENT:  no nasal polyps, mucus membranes moist Lungs:    Diminished, No increased respiratory effort, symmetric chest wall excursion, clear to auscultation bilaterally, no wheezes or crackles CV:         Regular rate and rhythm; no murmurs, rubs, or gallops.  No pedal edema Abd:      + bowel sounds; soft, non-tender; no distension MSK: no acute synovitis of DIP or PIP joints, no mechanics hands.  Skin:      Warm and dry, ecchymoses on the hands Neuro: normal speech, no focal facial asymmetry Psych: alert and oriented x3, normal mood and affect   Data Reviewed/Medical Decision Making:  Independent interpretation of  tests: Imaging: . Review of patient's swallow study images revealed chest xray with bilateral interstitial opacities consistent with aspiration. The patient's images have been independently reviewed by me.    PFTs: None on file  Labs:  Lab Results  Component Value Date   NA 135 05/14/2020   K 3.6 05/14/2020   CL 100 05/14/2020   CO2 26 05/14/2020   Lab Results  Component Value Date   WBC 8.7 05/13/2020   HGB 13.5 05/13/2020   HCT 41.6 05/13/2020   MCV 98.1 05/13/2020   PLT 162 05/13/2020     Immunization status:  Immunization History  Administered Date(s) Administered  . Fluad Quad(high Dose 65+) 07/12/2020  . PFIZER SARS-COV-2 Vaccination 10/27/2019, 11/21/2019, 07/19/2020  . Tdap 06/17/2019    . I reviewed prior external note(s) from neurology and hospital stay.  . I reviewed the result(s) of the labs and imaging as noted above.  . I have ordered PFT  Discussion of management or test interpretation with another colleague.  Assessment:  Chronic Cough Recurrent Aspiration Moderate Pharyngeal dysphagia  Plan/Recommendations: Will obtain PFTs to evaluate for neuromuscular weakness.  Will re-refer to SLP for evaluation  We discussed disease management and progression at length today.   Return to Care: Return in about 4 weeks (around 09/02/2020).  Lenice Llamas, MD Pulmonary and [place]  CC: Shirline Frees, MD

## 2020-08-06 ENCOUNTER — Ambulatory Visit (INDEPENDENT_AMBULATORY_CARE_PROVIDER_SITE_OTHER): Payer: Medicare Other | Admitting: Neurology

## 2020-08-06 ENCOUNTER — Encounter: Payer: Self-pay | Admitting: Neurology

## 2020-08-06 VITALS — BP 175/78 | HR 79 | Ht 67.0 in | Wt 155.0 lb

## 2020-08-06 DIAGNOSIS — I6523 Occlusion and stenosis of bilateral carotid arteries: Secondary | ICD-10-CM | POA: Diagnosis not present

## 2020-08-06 DIAGNOSIS — G7001 Myasthenia gravis with (acute) exacerbation: Secondary | ICD-10-CM | POA: Diagnosis not present

## 2020-08-06 MED ORDER — PREDNISONE 20 MG PO TABS
40.0000 mg | ORAL_TABLET | Freq: Every day | ORAL | 2 refills | Status: DC
Start: 2020-08-06 — End: 2020-08-16

## 2020-08-06 MED ORDER — PANTOPRAZOLE SODIUM 40 MG PO TBEC: 40.0000 mg | DELAYED_RELEASE_TABLET | Freq: Every day | ORAL | 1 refills | Status: DC

## 2020-08-06 NOTE — Telephone Encounter (Signed)
Dr. Shearon Stalls- are you needing records from pt's home health care agency? He says that this was mentioned at his visit but I do not think a release was signed.

## 2020-08-06 NOTE — Telephone Encounter (Signed)
Yes we wanted his SLP records due to aspiration concern. Must have forgotten to get released signed.

## 2020-08-06 NOTE — Progress Notes (Signed)
Follow-up Visit   Date: 08/06/20   [name] MRN: [id number] DOB: 1930-12-29   Interim History: [name] is a 86 y.o. right-handed Caucasian male with CAD, hypertension, bradycardia s/p PPM returning to the clinic for follow-up of seropositive myasthenia gravis.  The patient was accompanied to the clinic by caregiver who also provides collateral information.    History of present illness: Starting around early August, he began having difficulty swallowing both solids and liquids, sometimes having choking spells.  Symptoms progressed into slurred speech, neck heaviness, and generalized weakness.  After sustaining a fall, patient was transferred to [facility] via EMS. CT head did not show stroke.  Unable to get MRI due to PPM.  He was seen by Dr. Earnestine Leys who had high clinical suspicion for myasthenia and started him on mestinon 30mg .  Within an 1.5h, he noticed that his eyelids opened and speech improved.  He takes mestinon 7am, 1pm, and 7pm. Patient felt much better and did not wish to stay in the hospital for IVIG and was discharged home. AChR pending. Since being home, he continues to feel better.  He is on a pureed diet and is tolerating it well.   He lives alone in a one-level apartment.  He was walking unassisted prior to hospitalization. At baseline, his is very active and does his own ADLs and IADLs. He is hoping to travel to [place] in January.   UPDATE 07/16/2020:  Because he remained symptomatic, he was started on home IVIG.  Since getting IVIG, he is doing much better with respect to swallowing and generalized weakness.  He continues to have slurred speech, especially after talking a lot.   UPDATE 08/06/2020:  He was scheduled for urgent visit due to worsening dysphagia over the past two weeks.  There as one spell where he was having near-choking spells and was going to call EMS.  He has not had any other spells quite as severe, but has tried to mitigate this problem by  avoiding food.  He has difficulty with thin liquids and prefers shakes/smoothies.  Speech continues to be slurred at the end of the day or with excessive talking.  No double vision or droopy eyelids.  He mentioned these symptoms to his cardiologist and pulmonologist this week who updated me.  I recommended that we add prednisone 20mg /d and increase frequency of IVIG to every 21 days. His next infusion is scheduled for 12/2.    Medications:  Current Outpatient Medications on File Prior to Visit  Medication Sig Dispense Refill  . acetaminophen (TYLENOL) 325 MG tablet Take 650 mg by mouth every 6 (six) hours as needed for mild pain.    Marland Kitchen amLODipine (NORVASC) 5 MG tablet Take 1 tablet by mouth once daily 90 tablet 0  . aspirin 81 MG tablet Take 81 mg by mouth daily.     . clopidogrel (PLAVIX) 75 MG tablet Take 1 tablet by mouth once daily 90 tablet 1  . finasteride (PROSCAR) 5 MG tablet Take 5 mg by mouth daily.    . fluticasone (FLONASE) 50 MCG/ACT nasal spray Place 1 spray into both nostrils daily as needed for allergies or rhinitis.    Marland Kitchen NITROSTAT 0.4 MG SL tablet DISSOLVE ONE TABLET UNDER THE TONGUE EVERY 5 MINUTES AS NEEDED FOR CHEST PAIN.  DO NOT EXCEED A TOTAL OF 3 DOSES IN 15 MINUTES (Patient not taking: Reported on 08/05/2020) 75 tablet 2  . predniSONE (DELTASONE) 10 MG tablet Take 2 tablets (20  mg total) by mouth daily with breakfast. 60 tablet 3  . pyridostigmine (MESTINON) 60 MG tablet Take 1 tablet at 7am, 10am 1pm, and 5pm 480 tablet 3  . spironolactone (ALDACTONE) 25 MG tablet Take 0.5 tablets (12.5 mg total) by mouth daily. 45 tablet 1   No current facility-administered medications on file prior to visit.    Allergies:  Allergies  Allergen Reactions  . Cyclosporine     unknown  . Maxzide [Triamterene-Hctz] Other (See Comments)    Adverse reaction-bloating and abdominal pain  . Sulfa Antibiotics   . Sulfur Other (See Comments)    No energy, could not walk   . Prednisone Other  (See Comments)    Can tolerate for a short period of time Unknown reaction    Vital Signs:  There were no vitals taken for this visit.  Neurological Exam: MENTAL STATUS including orientation to time, place, person, recent and remote memory, attention span and concentration, language, and fund of knowledge is fairly intact.  Speech is mildly dysarthric, worse with prolonged talking.  Lingual and gutteral sounds are intact.   CRANIAL NERVES:  No visual field defects.  Pupils equal round and reactive to light.  Normal conjugate, extra-ocular eye movements in all directions of gaze.  Mild right ptosis at baseline, no worsening with sustained upgaze.  Face is symmetric. Palate elevates symmetrically.  Tongue is midline.  Oribicularis oculi 5/5, buccinator and tongue strength is 4+/5  MOTOR:  Motor strength is 5/5 in all extremities, no fatigability.  Neck strength is 5/5 No atrophy, fasciculations or abnormal movements.  No pronator drift.  Tone is normal.    COORDINATION/GAIT:  Gait not tested  Data: Labs 05/13/2020:  AChR binding 61*  IMPRESSION/PLAN: 1.  Seropositive myasthenia gravis with exacerbation manifesting with dysphagia and dysarthria   - IVIG has been increased to 1g/kg every 21 days  - Increase prednisone 40mg  daily.  Side effects discussed   - Start calcium supplements and protonix 40mg /d   - Once stabilized, plan to taper prednisone slowly  - Continue mestinon 60mg  four times daily at 7am, 10am, 1pm, and 5pm. OK to take extra 30-60mg  daily as needed  - Chin tuck position for swallowing  - Thickner for liquids recommended.    - Pureed/soft foods only, until swallowing improves. Add ensure/boost 2-3 cans daily  2.  Mild neurocognitive impairment  - Patient will be having formal driving evaluation  - I do not recommend travel at this time.   Return to clinic in 2-3 weeks  Total time spent reviewing records, interview, history/exam, counseling, documentation, and  coordination of care on day of encounter:  30 min      Thank you for allowing me to participate in patient's care.  If I can answer any additional questions, I would be pleased to do so.    Sincerely,    Sheriann Newmann K. Posey Pronto, DO

## 2020-08-06 NOTE — Patient Instructions (Addendum)
Increase prednisone to 40mg  daily  Calcium 1000mg  daily  Start protonix 40mg  daily  Continue mestinon 60mg  four times daily.  OK to take extra mestinon 30mg  - 60mg  as needed.  If you develop diarrhea, cramping, or drooling, reduce your mestinon.  Continue IVIG every 21 days

## 2020-08-16 ENCOUNTER — Telehealth: Payer: Self-pay | Admitting: Neurology

## 2020-08-16 MED ORDER — PREDNISONE 20 MG PO TABS: 20.0000 mg | ORAL_TABLET | Freq: Every day | ORAL | 2 refills | Status: AC

## 2020-08-16 NOTE — Telephone Encounter (Signed)
Pt daughter called back no answer left a voice mail for her to call the office

## 2020-08-16 NOTE — Telephone Encounter (Signed)
Spoke with pt daughter she stated after increase in prednisone pt is hyper he is moving things in silly places. 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 He wants to go to [facility] and says he is going no matter what anyone says, they are worried about him doing the driving evaluation with the increase in prednisone with him hyped up. And he is up at 2 am eating,

## 2020-08-16 NOTE — Telephone Encounter (Signed)
Pt daughter would like to speak to someone about the prednisone

## 2020-08-16 NOTE — Telephone Encounter (Signed)
Behavior issues are potential side effect of prednisone - let's have him go back down to 20mg  daily and call with update on Friday to see how he is doing.

## 2020-08-16 NOTE — Telephone Encounter (Signed)
  Spoke with pt daughter Shirlean Mylar informed her that  Behavior issues are potential side effect of prednisone - she will  have him go back down to 20mg  daily Per Dr Posey Pronto and call with update on Friday to see how he is doing.

## 2020-08-20 ENCOUNTER — Telehealth: Payer: Self-pay | Admitting: Neurology

## 2020-08-20 NOTE — Telephone Encounter (Signed)
Patient's daughter called in stating she is concerned. She stated his behavior is still erratic after lowering his dosage of prednisone. She would like to speak with someone. She will be on a call from 10-10:30 so she would like the call to be after that.

## 2020-08-20 NOTE — Telephone Encounter (Signed)
Called daughter personally.  Discussed that it may take longer to see prednisone side effects improve. In the meantime, recommend behavior modification such as distraction/redirectioning patient, and use antipsychotic medication as a last resort. Unfortunately, prednisone cannot be discontinue because he remains symptomatic from myasthenia gravis (dysphagia, dysarthria), despite being on IVIG.  If MG symptoms get worse, the next option is in-patient plasmapheresis.  Steroid-sparing agents will take several months to appreciate therapeutic benefit, so options are limited.  Daughter expresses understanding and prefers nonpharmacological intervention.

## 2020-08-23 ENCOUNTER — Telehealth: Payer: Self-pay | Admitting: Neurology

## 2020-08-23 NOTE — Telephone Encounter (Signed)
Can you renew this?

## 2020-08-23 NOTE — Telephone Encounter (Signed)
Patient's daughter called and said the patient needs a new order with authorization for IVIG, the old orders and authorization expired.

## 2020-08-24 NOTE — Telephone Encounter (Signed)
Per previous msg Mahina sent in new orders to [facility] and I will do PA if needed. Thanks!

## 2020-08-26 ENCOUNTER — Emergency Department (HOSPITAL_COMMUNITY): Payer: Medicare Other

## 2020-08-26 ENCOUNTER — Telehealth: Payer: Self-pay

## 2020-08-26 ENCOUNTER — Inpatient Hospital Stay (HOSPITAL_COMMUNITY)
Admission: EM | Admit: 2020-08-26 | Discharge: 2020-08-31 | DRG: 092 | Disposition: A | Discharge status: Skilled Nursing Facility | Payer: Medicare Other | Attending: Internal Medicine | Admitting: Internal Medicine

## 2020-08-26 ENCOUNTER — Encounter (HOSPITAL_COMMUNITY): Payer: Self-pay | Admitting: Emergency Medicine

## 2020-08-26 ENCOUNTER — Other Ambulatory Visit: Payer: Self-pay

## 2020-08-26 DIAGNOSIS — F09 Unspecified mental disorder due to known physiological condition: Secondary | ICD-10-CM | POA: Diagnosis present

## 2020-08-26 DIAGNOSIS — Z8249 Family history of ischemic heart disease and other diseases of the circulatory system: Secondary | ICD-10-CM

## 2020-08-26 DIAGNOSIS — Z881 Allergy status to other antibiotic agents status: Secondary | ICD-10-CM

## 2020-08-26 DIAGNOSIS — T380X5A Adverse effect of glucocorticoids and synthetic analogues, initial encounter: Secondary | ICD-10-CM | POA: Diagnosis not present

## 2020-08-26 DIAGNOSIS — G928 Other toxic encephalopathy: Secondary | ICD-10-CM | POA: Diagnosis present

## 2020-08-26 DIAGNOSIS — E871 Hypo-osmolality and hyponatremia: Secondary | ICD-10-CM | POA: Diagnosis not present

## 2020-08-26 DIAGNOSIS — I10 Essential (primary) hypertension: Secondary | ICD-10-CM | POA: Diagnosis not present

## 2020-08-26 DIAGNOSIS — G9340 Encephalopathy, unspecified: Secondary | ICD-10-CM

## 2020-08-26 DIAGNOSIS — R4182 Altered mental status, unspecified: Secondary | ICD-10-CM | POA: Diagnosis present

## 2020-08-26 DIAGNOSIS — I495 Sick sinus syndrome: Secondary | ICD-10-CM | POA: Diagnosis present

## 2020-08-26 DIAGNOSIS — Z20822 Contact with and (suspected) exposure to covid-19: Secondary | ICD-10-CM | POA: Diagnosis not present

## 2020-08-26 DIAGNOSIS — Z7902 Long term (current) use of antithrombotics/antiplatelets: Secondary | ICD-10-CM

## 2020-08-26 DIAGNOSIS — E785 Hyperlipidemia, unspecified: Secondary | ICD-10-CM | POA: Diagnosis present

## 2020-08-26 DIAGNOSIS — R1310 Dysphagia, unspecified: Secondary | ICD-10-CM | POA: Diagnosis present

## 2020-08-26 DIAGNOSIS — D696 Thrombocytopenia, unspecified: Secondary | ICD-10-CM | POA: Diagnosis present

## 2020-08-26 DIAGNOSIS — Z888 Allergy status to other drugs, medicaments and biological substances status: Secondary | ICD-10-CM

## 2020-08-26 DIAGNOSIS — I6529 Occlusion and stenosis of unspecified carotid artery: Secondary | ICD-10-CM | POA: Diagnosis present

## 2020-08-26 DIAGNOSIS — Z882 Allergy status to sulfonamides status: Secondary | ICD-10-CM

## 2020-08-26 DIAGNOSIS — K219 Gastro-esophageal reflux disease without esophagitis: Secondary | ICD-10-CM | POA: Diagnosis present

## 2020-08-26 DIAGNOSIS — E782 Mixed hyperlipidemia: Secondary | ICD-10-CM | POA: Diagnosis present

## 2020-08-26 DIAGNOSIS — E876 Hypokalemia: Secondary | ICD-10-CM | POA: Diagnosis present

## 2020-08-26 DIAGNOSIS — G7000 Myasthenia gravis without (acute) exacerbation: Secondary | ICD-10-CM | POA: Diagnosis present

## 2020-08-26 DIAGNOSIS — R410 Disorientation, unspecified: Secondary | ICD-10-CM | POA: Diagnosis not present

## 2020-08-26 DIAGNOSIS — Z950 Presence of cardiac pacemaker: Secondary | ICD-10-CM

## 2020-08-26 DIAGNOSIS — Z79899 Other long term (current) drug therapy: Secondary | ICD-10-CM

## 2020-08-26 DIAGNOSIS — I2510 Atherosclerotic heart disease of native coronary artery without angina pectoris: Secondary | ICD-10-CM | POA: Diagnosis present

## 2020-08-26 DIAGNOSIS — Z7952 Long term (current) use of systemic steroids: Secondary | ICD-10-CM

## 2020-08-26 DIAGNOSIS — N400 Enlarged prostate without lower urinary tract symptoms: Secondary | ICD-10-CM | POA: Diagnosis present

## 2020-08-26 DIAGNOSIS — Z7982 Long term (current) use of aspirin: Secondary | ICD-10-CM

## 2020-08-26 DIAGNOSIS — N401 Enlarged prostate with lower urinary tract symptoms: Secondary | ICD-10-CM | POA: Diagnosis present

## 2020-08-26 DIAGNOSIS — Z9861 Coronary angioplasty status: Secondary | ICD-10-CM

## 2020-08-26 LAB — I-STAT VENOUS BLOOD GAS, ED
Acid-Base Excess: 3 mmol/L — ABNORMAL HIGH (ref 0.0–2.0)
Bicarbonate: 27.6 mmol/L (ref 20.0–28.0)
Calcium, Ion: 1.11 mmol/L — ABNORMAL LOW (ref 1.15–1.40)
HCT: 42 % (ref 39.0–52.0)
Hemoglobin: 14.3 g/dL (ref 13.0–17.0)
O2 Saturation: 99 %
Potassium: 4.3 mmol/L (ref 3.5–5.1)
Sodium: 136 mmol/L (ref 135–145)
TCO2: 29 mmol/L (ref 22–32)
pCO2, Ven: 40.6 mmHg — ABNORMAL LOW (ref 44.0–60.0)
pH, Ven: 7.441 — ABNORMAL HIGH (ref 7.250–7.430)
pO2, Ven: 119 mmHg — ABNORMAL HIGH (ref 32.0–45.0)

## 2020-08-26 LAB — COMPREHENSIVE METABOLIC PANEL
ALT: 35 U/L (ref 0–44)
AST: 25 U/L (ref 15–41)
Albumin: 3 g/dL — ABNORMAL LOW (ref 3.5–5.0)
Alkaline Phosphatase: 97 U/L (ref 38–126)
Anion gap: 8 (ref 5–15)
BUN: 42 mg/dL — ABNORMAL HIGH (ref 8–23)
CO2: 27 mmol/L (ref 22–32)
Calcium: 8.8 mg/dL — ABNORMAL LOW (ref 8.9–10.3)
Chloride: 97 mmol/L — ABNORMAL LOW (ref 98–111)
Creatinine, Ser: 1.09 mg/dL (ref 0.61–1.24)
GFR, Estimated: >60 mL/min (ref >60)
Glucose, Bld: 117 mg/dL — ABNORMAL HIGH (ref 70–99)
Potassium: 4.3 mmol/L (ref 3.5–5.1)
Sodium: 132 mmol/L — ABNORMAL LOW (ref 135–145)
Total Bilirubin: 0.5 mg/dL (ref 0.3–1.2)
Total Protein: 8.9 g/dL — ABNORMAL HIGH (ref 6.5–8.1)

## 2020-08-26 LAB — URINALYSIS, ROUTINE W REFLEX MICROSCOPIC
Bilirubin Urine: NEGATIVE
Glucose, UA: NEGATIVE mg/dL
Hgb urine dipstick: NEGATIVE
Ketones, ur: NEGATIVE mg/dL
Leukocytes,Ua: NEGATIVE
Nitrite: NEGATIVE
Protein, ur: NEGATIVE mg/dL
Specific Gravity, Urine: 1.01 (ref 1.005–1.030)
pH: 6 (ref 5.0–8.0)

## 2020-08-26 LAB — CBC
HCT: 40.8 % (ref 39.0–52.0)
Hemoglobin: 13.5 g/dL (ref 13.0–17.0)
MCH: 32.8 pg (ref 26.0–34.0)
MCHC: 33.1 g/dL (ref 30.0–36.0)
MCV: 99 fL (ref 80.0–100.0)
Platelets: 178 10*3/uL (ref 150–400)
RBC: 4.12 MIL/uL — ABNORMAL LOW (ref 4.22–5.81)
RDW: 16.2 % — ABNORMAL HIGH (ref 11.5–15.5)
WBC: 8.5 10*3/uL (ref 4.0–10.5)
nRBC: 0 % (ref 0.0–0.2)

## 2020-08-26 LAB — TSH: TSH: 1.353 u[IU]/mL (ref 0.350–4.500)

## 2020-08-26 LAB — CBG MONITORING, ED: Glucose-Capillary: 110 mg/dL — ABNORMAL HIGH (ref 70–99)

## 2020-08-26 LAB — AMMONIA: Ammonia: 20 umol/L (ref 9–35)

## 2020-08-26 MED ORDER — LACTATED RINGERS IV BOLUS
500.0000 mL | Freq: Once | INTRAVENOUS | Status: AC
  Administered 2020-08-26: 500 mL via INTRAVENOUS

## 2020-08-26 NOTE — Telephone Encounter (Signed)
Called and spoke to patients daughter Shirlean Mylar and informed her of Dr. Serita Grit recommendations and to have patient seek care at the emergency room to be evaluated for acute symptoms. Patients daughter verbalized understanding and will take patient to the ER. Informed patients daughter to please give [place] a call to update [place] on patient. Per Dr. Posey Pronto patients appt can be canceled for tomorrow as patient needs emergency care for his acute symptoms.

## 2020-08-26 NOTE — ED Notes (Signed)
[place] Alig(Wife#[phone]) called/would like a call for update and once in room.  She is waiting in her car in the Emergency Room Parking lot.

## 2020-08-26 NOTE — ED Triage Notes (Signed)
Pt arrives with home health nurse who reports that patient has been increasingly confused for the past 2-3 weeks with confusion being worse over the past 4 days. Recently started on prednisone 20mg  by neuro for MG, increased to 40mg  which seemed to increase his confusion so neuro dropped him back to 20mg . Home health nurse reports cloudy urine.

## 2020-08-26 NOTE — ED Provider Notes (Signed)
[facility] EMERGENCY DEPARTMENT Provider Note   CSN: [id number] Arrival date & time: 08/26/20  1256     History Chief Complaint  Patient presents with  . Altered Mental Status  . Urinary Tract Infection    [name] is a 86 y.o. male with a history of myasthenia gravis on prednisone, CAD, seizures, hypertension, and hyperlipidemia presents with worsening altered mental status/confusion.  Per daughter, the patient has had declining mental status over the past 4 weeks.  He has abnormal behavior including forgetting the code to his apartment where he lives alone, defecating on the floor, peeing in the trash can, and not taking his medications.  She has had to have a home health nurse come be with him earlier due to concerns for his behavior.  No clear inciting event or trauma.  Patient has a history of MG and is on prednisone.  He was initially on 20 mg daily but was increased to 40 mg.  While he was on 40 mg for short period of time, he had some behavioral issues so was decreased to 20 mg daily about 2 weeks ago.  Daughter states that the person that comes to do his IVIG infusions noted an acute change since he had had infusions about 21 days ago, called the daughter, and stated that he was concerned.  Per the caregiver who is at bedside, she also notes that he has been more confused including being more confused since this morning until about 2 PM.  It seems that his mental status has had a steady decline over the past 4 weeks although does wax and wane.  The history is provided by a relative and a caregiver.  Altered Mental Status Presenting symptoms: behavior changes, confusion and disorientation   Severity:  Mild Most recent episode:  Today Episode history:  Continuous Duration:  4 weeks Timing:  Constant Progression:  Waxing and waning Chronicity:  New Context: recent change in medication   Context: not head injury, not recent illness and not recent infection    Associated symptoms: no abdominal pain, no fever, no nausea and no vomiting        Past Medical History:  Diagnosis Date  . Carotid artery disease ([place])   . Coronary artery disease    PCI RCA 2009  . Dyslipidemia   . Hypertension   . Scleritis, unspecified    treated with prednisone  . Seizure [facility])    felt due to cyclosporin in setting of electrolyte disorder  . Solitary kidney   . Symptomatic bradycardia    a. s/p STJ dual chamber pacemaker    Patient Active Problem List   Diagnosis Date Noted  . Toxic metabolic encephalopathy 15/40/0867  . Stroke-like symptom 05/12/2020  . Leukocytosis 05/12/2020  . Macrocytosis 05/12/2020  . Stroke-like symptoms 05/12/2020  . Closed fracture of right distal humerus 07/03/2019  . Carotid artery disease ([place]) 09/09/2018  . Pacemaker-St.Jude 07/03/2012  . Bruit 09/28/2011  . Tongue lesion 08/15/2011  . HYPERTENSION, BENIGN 06/23/2010  . Hyperlipidemia LDL goal <70 09/22/2008  . CAD, NATIVE VESSEL 09/22/2008  . Edema 09/22/2008  . SSS (sick sinus syndrome) ([place]) 09/22/2008    Past Surgical History:  Procedure Laterality Date  . CARDIAC CATHETERIZATION     12 years ago?  . COLONOSCOPY    . EYE SURGERY     right eye about 10 years ago  . ORIF HUMERUS FRACTURE Right 07/03/2019   Procedure: Right distal humerus open reduction, internal fixation;  Surgeon: Verner Mould, MD;  Location: [facility];  Service: Orthopedics;  Laterality: Right;  . PACEMAKER INSERTION  2009   STJ dual chamber pacemaker implanted by Dr Olevia Perches for symptomatic bradycardia       Family History  Problem Relation Age of Onset  . Arthritis/Rheumatoid Mother   . Heart disease Mother   . COPD Father   . Alcohol abuse Son     Social History   Tobacco Use  . Smoking status: Never Smoker  . Smokeless tobacco: Never Used  Vaping Use  . Vaping Use: Never used  Substance Use Topics  . Alcohol use: Yes    Comment: rarely  . Drug use: No    Home  Medications Prior to Admission medications   Medication Sig Start Date End Date Taking? Authorizing Provider  amLODipine (NORVASC) 5 MG tablet Take 1 tablet by mouth once daily Patient taking differently: Take 5 mg by mouth daily. 07/19/20  Yes Fay Records, MD  aspirin 81 MG tablet Take 81 mg by mouth daily.  07/19/11  Yes Allred, Jeneen Rinks, MD  clopidogrel (PLAVIX) 75 MG tablet Take 1 tablet by mouth once daily Patient taking differently: Take 75 mg by mouth daily. 06/29/20  Yes Fay Records, MD  finasteride (PROSCAR) 5 MG tablet Take 5 mg by mouth daily.   Yes [provider]  GAMMAGARD 5 GM/50ML SOLN every 21 ( twenty-one) days. 08/24/20  Yes [provider]  NITROSTAT 0.4 MG SL tablet DISSOLVE ONE TABLET UNDER THE TONGUE EVERY 5 MINUTES AS NEEDED FOR CHEST PAIN.  DO NOT EXCEED A TOTAL OF 3 DOSES IN 15 MINUTES Patient taking differently: Place 0.4 mg under the tongue every 5 (five) minutes x 3 doses as needed for chest pain. 11/19/18  Yes Fay Records, MD  pantoprazole (PROTONIX) 40 MG tablet Take 1 tablet (40 mg total) by mouth daily. Patient taking differently: Take 40 mg by mouth daily before breakfast. 08/06/20  Yes Patel, Donika K, DO  predniSONE (DELTASONE) 20 MG tablet Take 1 tablet (20 mg total) by mouth daily with breakfast. 08/16/20  Yes Patel, Donika K, DO  pyridostigmine (MESTINON) 60 MG tablet Take 1 tablet at 7am, 10am 1pm, and 5pm Patient taking differently: Take 60 mg by mouth See admin instructions. Take 60 mg by mouth at 7 AM, 10 AM, 1 PM, and 5 PM 07/16/20  Yes Patel, Donika K, DO  spironolactone (ALDACTONE) 25 MG tablet Take 0.5 tablets (12.5 mg total) by mouth daily. 07/19/20  Yes Fay Records, MD    Allergies    Cefuroxime axetil, Cyclosporine, Maxzide [triamterene-hctz], Sulfa antibiotics, Sulfamethoxazole, Sulfamethoxazole-trimethoprim, Sulfur, and Prednisone  Review of Systems   Review of Systems  Constitutional: Negative for fever.   Gastrointestinal: Negative for abdominal pain, nausea and vomiting.  Psychiatric/Behavioral: Positive for confusion.  All other systems reviewed and are negative.   Physical Exam Updated Vital Signs BP (!) 158/72   Pulse 73   Temp 97.7 F (36.5 C) (Oral)   Resp (!) 22   Ht 5\' 7"  (1.702 m)   Wt 70.3 kg   SpO2 98%   BMI 24.28 kg/m   Physical Exam Vitals and nursing note reviewed.  Constitutional:      General: He is not in acute distress.    Appearance: Normal appearance. He is well-developed and well-nourished. He is not ill-appearing or toxic-appearing.  HENT:     Head: Normocephalic and atraumatic.  Eyes:     Conjunctiva/sclera: Conjunctivae  normal.  Cardiovascular:     Rate and Rhythm: Normal rate and regular rhythm.     Heart sounds: No murmur heard.   Pulmonary:     Effort: Pulmonary effort is normal. No respiratory distress.     Breath sounds: Normal breath sounds.  Abdominal:     General: There is no distension.     Palpations: Abdomen is soft.     Tenderness: There is no abdominal tenderness.  Musculoskeletal:        General: No edema.     Cervical back: Neck supple.  Skin:    General: Skin is warm and dry.  Neurological:     General: No focal deficit present.     Mental Status: He is alert.     Motor: No weakness.  Psychiatric:        Mood and Affect: Mood and affect normal.     ED Results / Procedures / Treatments   Labs (all labs ordered are listed, but only abnormal results are displayed) Labs Reviewed  COMPREHENSIVE METABOLIC PANEL - Abnormal; Notable for the following components:      Result Value   Sodium 132 (*)    Chloride 97 (*)    Glucose, Bld 117 (*)    BUN 42 (*)    Calcium 8.8 (*)    Total Protein 8.9 (*)    Albumin 3.0 (*)    All other components within normal limits  CBC - Abnormal; Notable for the following components:   RBC 4.12 (*)    RDW 16.2 (*)    All other components within normal limits  URINALYSIS, ROUTINE W  REFLEX MICROSCOPIC - Abnormal; Notable for the following components:   Color, Urine STRAW (*)    All other components within normal limits  CBG MONITORING, ED - Abnormal; Notable for the following components:   Glucose-Capillary 110 (*)    All other components within normal limits  I-STAT VENOUS BLOOD GAS, ED - Abnormal; Notable for the following components:   pH, Ven 7.441 (*)    pCO2, Ven 40.6 (*)    pO2, Ven 119.0 (*)    Acid-Base Excess 3.0 (*)    Calcium, Ion 1.11 (*)    All other components within normal limits  URINE CULTURE  AMMONIA  TSH    EKG EKG Interpretation  Date/Time:  Thursday August 26 2020 13:10:48 EST Ventricular Rate:  78 PR Interval:  154 QRS Duration: 80 QT Interval:  370 QTC Calculation: 421 R Axis:   15 Text Interpretation: Sinus rhythm with sinus arrhythmia with occasional Premature ventricular complexes Otherwise normal ECG Confirmed by Noemi Chapel [phone]) on 08/26/2020 7:21:30 PM   Radiology CT Head Wo Contrast  Result Date: 08/26/2020 CLINICAL DATA:  Mental status change increased confusion EXAM: CT HEAD WITHOUT CONTRAST TECHNIQUE: Contiguous axial images were obtained from the base of the skull through the vertex without intravenous contrast. COMPARISON:  05/12/2020 FINDINGS: Brain: No acute territorial infarction, hemorrhage, or intracranial mass. Mild atrophy. Mild hypodensity in the white matter likely chronic small vessel ischemic change. Stable ventricle size Vascular: No hyperdense vessels. Vertebral and carotid vascular calcification Skull: Normal. Negative for fracture or focal lesion. Sinuses/Orbits: No acute finding. Other: None IMPRESSION: 1. No CT evidence for acute intracranial abnormality. 2. Atrophy and mild chronic small vessel ischemic changes of the white matter. Electronically Signed   By: Donavan Foil M.D.   On: 08/26/2020 21:13   DG Chest Portable 1 View  Result Date: 08/26/2020 CLINICAL DATA:  Altered mental status. EXAM:  PORTABLE CHEST 1 VIEW COMPARISON:  Chest x-ray 05/12/2020. FINDINGS: Oval-like 3.1 by 1 cm metallic density overlying the left neck likely external to the patient. Left chest wall 2 lead pacemaker in grossly similar position. The heart size and mediastinal contours are unchanged. Aortic arch and descending thoracic aorta atherosclerotic plaque. Biapical pleural/pulmonary scarring. Similar appear coarsened interstitial markings. Suggestion of hazy airspace opacity overlying the left mid to lower lung zone reticulations with silhouetting off of the left heart border. No pulmonary edema. No pleural effusion. No pneumothorax. No acute osseous abnormality. IMPRESSION: 1. Left mid to lower lung zone hazy airspace opacity could represent infection/inflammation. Followup PA and lateral chest X-ray is recommended in 3-4 weeks following therapy to ensure resolution and exclude underlying malignancy. 2. Oval-like 3.1 by 1 cm metallic density overlying the left neck likely external to the patient. Please correlate with physical exam. Electronically Signed   By: Iven Finn M.D.   On: 08/26/2020 23:02    Procedures Procedures (including critical care time)  Medications Ordered in ED Medications  lactated ringers bolus 500 mL (0 mLs Intravenous Stopped 08/26/20 2230)    ED Course  I have reviewed the triage vital signs and the nursing notes.  Pertinent labs & imaging results that were available during my care of the patient were reviewed by me and considered in my medical decision making (see chart for details).    MDM Rules/Calculators/A&P                          MDM: Marek Nghiem is a 86 y.o. male who presents with altered mental status as per above. I have reviewed the nursing documentation for past medical history, family history, and social history. Pertinent previous records reviewed. He is awake, alert. HDS. Afebrile. Physical exam is unremarkable.  Labs: VBG 7.4/41, UA unremarkable, sodium 132,  WBC within normal limits, glucose 110, ammonia 20, TSH 1.353.  Urine culture pending. EKG: NSR with PVC. QTc, PR, and QRS within appropriate limits. No signs of acute ischemia, infarct, or significant electrical abnormalities. No STEMI, ST depressions, or significant T wave inversions. No evidence of a High-Grade Conduction Block, WPW, Brugada Sign, ARVC, DeWinters T Waves, or Wellens Waves. Imaging: CXR demonstrating left lower lobe opacity appears fairly similar to CXR 05/12/2020 CT head with no acute intracranial abnormality.  Unable to obtain MRI brain secondary to pacemaker that is not compatible with MRI. Consults: Neurology Tx: 500 cc bolus  Differential Dx: Unclear etiology of patient's altered mental status but I am most concerned for infection versus polypharmacy. Differential remains broad. Given history, physical exam, and work-up, I do not think he has intracranial hemorrhage, bacterial meningitis, electrolyte derangement, acute intoxication, or trauma.  MDM: BADEN BETSCH is a 86 y.o. male presents with acute/subacute change in his mental status over the past 4 weeks.  Patient has a history of myasthenia recently had his prednisone changed but this was 2 weeks ago and is continued to have difficulty with confusion and bizarre behavior.  He is followed well outpatient with neurology as he is not this is an acute change over the past 3 to 4 weeks.  No obvious etiology and has no history of dementia.  Also do not felt this is consistent with worsening dementia due to history and that it is waxing and waning.  Neurology consulted due to myasthenia and recommended MR brain with contrast.  However, patient's pacemaker is not compatible  with MRI.  Thus, will admit the patient for further work-up due to concern for altered mentation.  Daughter updated at bedside.   The plan for this patient was discussed with Dr. Regenia Skeeter, who voiced agreement and who oversaw evaluation and treatment of this patient.    Final Clinical Impression(s) / ED Diagnoses Final diagnoses:  None    Rx / DC Orders ED Discharge Orders    None       Roshell Brigham, MD 08/26/20 2347    Sherwood Gambler, MD 08/27/20 1706

## 2020-08-26 NOTE — Consult Note (Addendum)
Neurology Consult H&P  CC: worsening altered mental status/confusion  History is obtained from: Patient and chart  HPI: [name] is a 86 y.o. male right handed, CAD, HTN, HLD, myasthenia gravis on prednisone purported to have worsening altered mental status/confusion. The patient stated that he was not really confused and it may have been related to occasional discord at home.   Denies headache/dizziness/nausea/vomiting  The following information taken from the chart:   "Per daughter, the patient has had declining mental status over the past 4 weeks. He has abnormal behavior including forgetting the code to his apartment where he lives alone for 40 year, defecating on the floor, peeing in the trash can, and not taking his medications. She has had to have a home health nurse come be with him earlier due to concerns for his behavior.  No clear inciting event or trauma. Patient has a history of MG and is on prednisone. He was initially on 20 mg daily but was increased to 40 mg.  While he was on 40 mg for short period of time, he had some behavioral issues so was decreased to 20 mg daily about 2 weeks ago.  Daughter states that the person that comes to do his IVIG infusions noted an acute change since he had had infusions about 21 days ago, called the daughter, and stated that he was concerned.  Per the caregiver who is at bedside, she also notes that he has been more confused including being more confused since this morning until about 2 PM. It seems that his mental status has had a steady decline over the past 4 weeks although does wax and wane."  ROS: A complete ROS was performed and is negative except as noted in the HPI.   Past Medical History:  Diagnosis Date  . Carotid artery disease ([place])   . Coronary artery disease    PCI RCA 2009  . Dyslipidemia   . Hypertension   . Scleritis, unspecified    treated with prednisone  . Seizure [facility])    felt due to cyclosporin in setting of  electrolyte disorder  . Solitary kidney   . Symptomatic bradycardia    a. s/p STJ dual chamber pacemaker     Family History  Problem Relation Age of Onset  . Arthritis/Rheumatoid Mother   . Heart disease Mother   . COPD Father   . Alcohol abuse Son     Social History:  reports that he has never smoked. He has never used smokeless tobacco. He reports current alcohol use. He reports that he does not use drugs.   Prior to Admission medications   Medication Sig Start Date End Date Taking? Authorizing Provider  amLODipine (NORVASC) 5 MG tablet Take 1 tablet by mouth once daily Patient taking differently: Take 5 mg by mouth daily. 07/19/20  Yes Fay Records, MD  aspirin 81 MG tablet Take 81 mg by mouth daily.  07/19/11  Yes Allred, Jeneen Rinks, MD  clopidogrel (PLAVIX) 75 MG tablet Take 1 tablet by mouth once daily Patient taking differently: Take 75 mg by mouth daily. 06/29/20  Yes Fay Records, MD  finasteride (PROSCAR) 5 MG tablet Take 5 mg by mouth daily.   Yes [provider]  GAMMAGARD 5 GM/50ML SOLN every 21 ( twenty-one) days. 08/24/20  Yes [provider]  NITROSTAT 0.4 MG SL tablet DISSOLVE ONE TABLET UNDER THE TONGUE EVERY 5 MINUTES AS NEEDED FOR CHEST PAIN.  DO NOT EXCEED A TOTAL OF 3  DOSES IN 15 MINUTES Patient taking differently: Place 0.4 mg under the tongue every 5 (five) minutes x 3 doses as needed for chest pain. 11/19/18  Yes Fay Records, MD  pantoprazole (PROTONIX) 40 MG tablet Take 1 tablet (40 mg total) by mouth daily. Patient taking differently: Take 40 mg by mouth daily before breakfast. 08/06/20  Yes Patel, Donika K, DO  predniSONE (DELTASONE) 20 MG tablet Take 1 tablet (20 mg total) by mouth daily with breakfast. 08/16/20  Yes Patel, Donika K, DO  pyridostigmine (MESTINON) 60 MG tablet Take 1 tablet at 7am, 10am 1pm, and 5pm Patient taking differently: Take 60 mg by mouth See admin instructions. Take 60 mg by mouth at 7 AM, 10 AM, 1 PM, and 5 PM  07/16/20  Yes Patel, Donika K, DO  spironolactone (ALDACTONE) 25 MG tablet Take 0.5 tablets (12.5 mg total) by mouth daily. 07/19/20  Yes Fay Records, MD    Exam: Current vital signs: BP (!) 158/72   Pulse 73   Temp 97.7 F (36.5 C) (Oral)   Resp (!) 22   Ht 5\' 7"  (1.702 m)   Wt 70.3 kg   SpO2 98%   BMI 24.28 kg/m   Physical Exam  Constitutional: Appears well-developed and well-nourished.  Psych: Affect appropriate to situation Eyes: No scleral injection HENT: No OP obstrucion Head: Normocephalic.  Cardiovascular: Normal rate and regular rhythm.  Respiratory: Effort normal and breath sounds normal to anterior ascultation GI: Soft.  No distension. There is no tenderness.  Skin: WDI  Neuro: Mental Status: Patient is awake, alert, oriented to person, place, month, year, and situation. Patient is able to give a clear and coherent history. No signs of aphasia or neglect. Cranial Nerves: II: Visual Fields are full. Pupils are equal, round, and reactive to light. III,IV, VI: EOMI without ptosis or diploplia.  V: Facial sensation is symmetric to temperature VII: Facial movement is symmetric.  VIII: hearing is intact to voice X: Uvula elevates symmetrically XI: Shoulder shrug is symmetric. XII: tongue is midline without atrophy or fasciculations.  Motor: Tone is normal. Bulk is normal. 5/5 strength was present in all four extremities. Sensory: Sensation is symmetric to light touch and temperature in the arms and legs. Deep Tendon Reflexes: 2+ and symmetric in the biceps and patellae. Plantars: Toes are downgoing bilaterally. Cerebellar: FNF and HKS are intact bilaterally.  I have reviewed labs in epic and the pertinent results are:   Ref. Range 08/26/2020 21:48  Ammonia Latest Ref Range: 9 - 35 umol/L 20    Ref. Range 08/26/2020 13:30  Sodium Latest Ref Range: 135 - 145 mmol/L 132 (L)  Potassium Latest Ref Range: 3.5 - 5.1 mmol/L 4.3  Chloride Latest Ref Range: 98 -  111 mmol/L 97 (L)  CO2 Latest Ref Range: 22 - 32 mmol/L 27  Glucose Latest Ref Range: 70 - 99 mg/dL 117 (H)    Ref. Range 08/26/2020 22:08  Sample type Unknown VENOUS  pH, Ven Latest Ref Range: 7.250 - 7.430  7.441 (H)  pCO2, Ven Latest Ref Range: 44.0 - 60.0 mmHg 40.6 (L)  pO2, Ven Latest Ref Range: 32.0 - 45.0 mmHg 119.0 (H)  TCO2 Latest Ref Range: 22 - 32 mmol/L 29  Acid-Base Excess Latest Ref Range: 0.0 - 2.0 mmol/L 3.0 (H)    I have reviewed the images obtained: NCT head showed No CT evidence for acute intracranial abnormality, age related atrophy and mild chronic small vessel ischemic white matter changes.  Assessment: [name] is a 86 y.o. male PMHx right handed, CAD, HTN, HLD, myasthenia gravis on prednisone purported to have worsening altered mental status/confusion.   The patient was alert, oriented and showed good insight. He had good knowledge of current events (political and social). He recounted a trip to [place] during the winter in the late 60s as well as his previous work and family life. Importantly, we discussed his past medical history and the 1 seizure he had ~13 years ago which was medication induced. Chart review confirmed that in 2008, he was being treated for  peripheral ulcerative scleritis (autoimmune) with high dose cyclosporin which precipitated acute hyponatremia with generalized seizure in setting of HTN. His treatments included cyclosporin, Cytoxan, prednisone, Xaltrex. He was then treated with prednisone and the patient had delirium as well as mood instability secondary to the prednisone treatment and responded to short course of haloperidol.  The patient has been on 20 mg/day glucocorticoid treatment which was increased to 40mg /day for 10 days and subsequently decreased back to 20mg /day due to behavioral changes. Glucocorticoids are known to cause memory impairment, mania/hypomania and psychosis especially doses >20mg /day. ~10% of patients may have  persistent symptoms despite lowering dose of glucocorticoid, necessitating neuroleptic treatment wherein improvement may be seen in ~2 weeeks.   Incidentally, scleritis is commonly associated with systemic autoimmune disorders.   Plan: - The patient is currently on IVIg and prednisone and would be hesitant to discontinue/interrupt treatment and therefore recommend continuing low dose prednisone 20mg /day. - Currently he is oriented with good insight and may consider prophylactic neuroleptic in setting prednisone escalation or worsening of psychiatric symptoms. - May consider plasmapheresis if there is concern for worsening symptoms. - Discussed with primary team and they may have conference with patient and daughter and may contact neurology to coordinate. - The patient does not have epilepsy or seizure disorder. He had 1 medication induced seizure 2008.   Electronically signed by: Dr. Lynnae Sandhoff Pager: [phone] 08/26/2020, 11:21 PM

## 2020-08-27 ENCOUNTER — Encounter (HOSPITAL_COMMUNITY): Payer: Self-pay | Admitting: Internal Medicine

## 2020-08-27 ENCOUNTER — Other Ambulatory Visit: Payer: Self-pay

## 2020-08-27 ENCOUNTER — Ambulatory Visit: Payer: Medicare Other | Admitting: Neurology

## 2020-08-27 DIAGNOSIS — E782 Mixed hyperlipidemia: Secondary | ICD-10-CM | POA: Diagnosis not present

## 2020-08-27 DIAGNOSIS — Z888 Allergy status to other drugs, medicaments and biological substances status: Secondary | ICD-10-CM | POA: Diagnosis not present

## 2020-08-27 DIAGNOSIS — Z9181 History of falling: Secondary | ICD-10-CM | POA: Diagnosis not present

## 2020-08-27 DIAGNOSIS — D696 Thrombocytopenia, unspecified: Secondary | ICD-10-CM | POA: Diagnosis present

## 2020-08-27 DIAGNOSIS — Z950 Presence of cardiac pacemaker: Secondary | ICD-10-CM | POA: Diagnosis not present

## 2020-08-27 DIAGNOSIS — I6529 Occlusion and stenosis of unspecified carotid artery: Secondary | ICD-10-CM | POA: Diagnosis present

## 2020-08-27 DIAGNOSIS — I10 Essential (primary) hypertension: Secondary | ICD-10-CM

## 2020-08-27 DIAGNOSIS — M2550 Pain in unspecified joint: Secondary | ICD-10-CM | POA: Diagnosis not present

## 2020-08-27 DIAGNOSIS — Z7902 Long term (current) use of antithrombotics/antiplatelets: Secondary | ICD-10-CM | POA: Diagnosis not present

## 2020-08-27 DIAGNOSIS — Z7401 Bed confinement status: Secondary | ICD-10-CM | POA: Diagnosis not present

## 2020-08-27 DIAGNOSIS — Z7982 Long term (current) use of aspirin: Secondary | ICD-10-CM | POA: Diagnosis not present

## 2020-08-27 DIAGNOSIS — E785 Hyperlipidemia, unspecified: Secondary | ICD-10-CM | POA: Diagnosis present

## 2020-08-27 DIAGNOSIS — N401 Enlarged prostate with lower urinary tract symptoms: Secondary | ICD-10-CM | POA: Diagnosis present

## 2020-08-27 DIAGNOSIS — Z20822 Contact with and (suspected) exposure to covid-19: Secondary | ICD-10-CM | POA: Diagnosis present

## 2020-08-27 DIAGNOSIS — Z881 Allergy status to other antibiotic agents status: Secondary | ICD-10-CM | POA: Diagnosis not present

## 2020-08-27 DIAGNOSIS — I495 Sick sinus syndrome: Secondary | ICD-10-CM | POA: Diagnosis present

## 2020-08-27 DIAGNOSIS — N400 Enlarged prostate without lower urinary tract symptoms: Secondary | ICD-10-CM | POA: Diagnosis present

## 2020-08-27 DIAGNOSIS — I2510 Atherosclerotic heart disease of native coronary artery without angina pectoris: Secondary | ICD-10-CM

## 2020-08-27 DIAGNOSIS — R350 Frequency of micturition: Secondary | ICD-10-CM | POA: Diagnosis not present

## 2020-08-27 DIAGNOSIS — F09 Unspecified mental disorder due to known physiological condition: Secondary | ICD-10-CM | POA: Diagnosis present

## 2020-08-27 DIAGNOSIS — R41841 Cognitive communication deficit: Secondary | ICD-10-CM | POA: Diagnosis not present

## 2020-08-27 DIAGNOSIS — Z882 Allergy status to sulfonamides status: Secondary | ICD-10-CM | POA: Diagnosis not present

## 2020-08-27 DIAGNOSIS — Z9861 Coronary angioplasty status: Secondary | ICD-10-CM | POA: Diagnosis not present

## 2020-08-27 DIAGNOSIS — M6281 Muscle weakness (generalized): Secondary | ICD-10-CM | POA: Diagnosis not present

## 2020-08-27 DIAGNOSIS — R2689 Other abnormalities of gait and mobility: Secondary | ICD-10-CM | POA: Diagnosis not present

## 2020-08-27 DIAGNOSIS — T380X5A Adverse effect of glucocorticoids and synthetic analogues, initial encounter: Secondary | ICD-10-CM | POA: Diagnosis present

## 2020-08-27 DIAGNOSIS — R4182 Altered mental status, unspecified: Secondary | ICD-10-CM | POA: Diagnosis not present

## 2020-08-27 DIAGNOSIS — G928 Other toxic encephalopathy: Principal | ICD-10-CM

## 2020-08-27 DIAGNOSIS — R2681 Unsteadiness on feet: Secondary | ICD-10-CM | POA: Diagnosis not present

## 2020-08-27 DIAGNOSIS — G7000 Myasthenia gravis without (acute) exacerbation: Secondary | ICD-10-CM | POA: Diagnosis present

## 2020-08-27 DIAGNOSIS — K219 Gastro-esophageal reflux disease without esophagitis: Secondary | ICD-10-CM | POA: Diagnosis present

## 2020-08-27 DIAGNOSIS — R1312 Dysphagia, oropharyngeal phase: Secondary | ICD-10-CM | POA: Diagnosis not present

## 2020-08-27 DIAGNOSIS — E871 Hypo-osmolality and hyponatremia: Secondary | ICD-10-CM | POA: Diagnosis present

## 2020-08-27 DIAGNOSIS — E876 Hypokalemia: Secondary | ICD-10-CM | POA: Diagnosis present

## 2020-08-27 DIAGNOSIS — R1310 Dysphagia, unspecified: Secondary | ICD-10-CM | POA: Diagnosis present

## 2020-08-27 DIAGNOSIS — Z8249 Family history of ischemic heart disease and other diseases of the circulatory system: Secondary | ICD-10-CM | POA: Diagnosis not present

## 2020-08-27 LAB — COMPREHENSIVE METABOLIC PANEL
ALT: 29 U/L (ref 0–44)
AST: 25 U/L (ref 15–41)
Albumin: 2.6 g/dL — ABNORMAL LOW (ref 3.5–5.0)
Alkaline Phosphatase: 74 U/L (ref 38–126)
Anion gap: 8 (ref 5–15)
BUN: 31 mg/dL — ABNORMAL HIGH (ref 8–23)
CO2: 26 mmol/L (ref 22–32)
Calcium: 8.2 mg/dL — ABNORMAL LOW (ref 8.9–10.3)
Chloride: 100 mmol/L (ref 98–111)
Creatinine, Ser: 1.07 mg/dL (ref 0.61–1.24)
GFR, Estimated: >60 mL/min (ref >60)
Glucose, Bld: 124 mg/dL — ABNORMAL HIGH (ref 70–99)
Potassium: 3.6 mmol/L (ref 3.5–5.1)
Sodium: 134 mmol/L — ABNORMAL LOW (ref 135–145)
Total Bilirubin: 0.6 mg/dL (ref 0.3–1.2)
Total Protein: 7.3 g/dL (ref 6.5–8.1)

## 2020-08-27 LAB — CBC WITH DIFFERENTIAL/PLATELET
Abs Immature Granulocytes: 0.03 10*3/uL (ref 0.00–0.07)
Basophils Absolute: 0 10*3/uL (ref 0.0–0.1)
Basophils Relative: 0 %
Eosinophils Absolute: 0 10*3/uL (ref 0.0–0.5)
Eosinophils Relative: 0 %
HCT: 37.5 % — ABNORMAL LOW (ref 39.0–52.0)
Hemoglobin: 12.3 g/dL — ABNORMAL LOW (ref 13.0–17.0)
Immature Granulocytes: 0 %
Lymphocytes Relative: 10 %
Lymphs Abs: 0.8 10*3/uL (ref 0.7–4.0)
MCH: 32.7 pg (ref 26.0–34.0)
MCHC: 32.8 g/dL (ref 30.0–36.0)
MCV: 99.7 fL (ref 80.0–100.0)
Monocytes Absolute: 0.9 10*3/uL (ref 0.1–1.0)
Monocytes Relative: 11 %
Neutro Abs: 6.2 10*3/uL (ref 1.7–7.7)
Neutrophils Relative %: 79 %
Platelets: 150 10*3/uL (ref 150–400)
RBC: 3.76 MIL/uL — ABNORMAL LOW (ref 4.22–5.81)
RDW: 16.2 % — ABNORMAL HIGH (ref 11.5–15.5)
WBC: 7.9 10*3/uL (ref 4.0–10.5)
nRBC: 0 % (ref 0.0–0.2)

## 2020-08-27 LAB — RESP PANEL BY RT-PCR (FLU A&B, COVID) ARPGX2
Influenza A by PCR: NEGATIVE
Influenza B by PCR: NEGATIVE
SARS Coronavirus 2 by RT PCR: NEGATIVE

## 2020-08-27 LAB — FOLATE: Folate: 14.9 ng/mL (ref >5.9)

## 2020-08-27 LAB — C-REACTIVE PROTEIN: CRP: 0.8 mg/dL (ref <1.0)

## 2020-08-27 LAB — VITAMIN B12: Vitamin B-12: 291 pg/mL (ref 180–914)

## 2020-08-27 LAB — LACTIC ACID, PLASMA: Lactic Acid, Venous: 1.7 mmol/L (ref 0.5–1.9)

## 2020-08-27 LAB — MAGNESIUM: Magnesium: 2.1 mg/dL (ref 1.7–2.4)

## 2020-08-27 LAB — CK: Total CK: 35 U/L — ABNORMAL LOW (ref 49–397)

## 2020-08-27 MED ORDER — ACETAMINOPHEN 325 MG PO TABS: 650.0000 mg | ORAL_TABLET | Freq: Four times a day (QID) | ORAL | Status: DC | PRN

## 2020-08-27 MED ORDER — QUETIAPINE FUMARATE 25 MG PO TABS
25.0000 mg | ORAL_TABLET | Freq: Every day | ORAL | Status: DC
Start: 2020-08-27 — End: 2020-08-28
  Administered 2020-08-27: 25 mg via ORAL
  Filled 2020-08-27: qty 1

## 2020-08-27 MED ORDER — PYRIDOSTIGMINE BROMIDE 60 MG PO TABS
60.0000 mg | ORAL_TABLET | ORAL | Status: DC
  Administered 2020-08-27 – 2020-08-31 (×18): 60 mg via ORAL
  Filled 2020-08-27 (×23): qty 1

## 2020-08-27 MED ORDER — ACETAMINOPHEN 650 MG RE SUPP: 650.0000 mg | Freq: Four times a day (QID) | RECTAL | Status: DC | PRN

## 2020-08-27 MED ORDER — ASPIRIN 81 MG PO CHEW
81.0000 mg | CHEWABLE_TABLET | Freq: Every day | ORAL | Status: DC
  Administered 2020-08-27 – 2020-08-31 (×5): 81 mg via ORAL
  Filled 2020-08-27 (×5): qty 1

## 2020-08-27 MED ORDER — FINASTERIDE 5 MG PO TABS
5.0000 mg | ORAL_TABLET | Freq: Every day | ORAL | Status: DC
  Administered 2020-08-27 – 2020-08-31 (×5): 5 mg via ORAL
  Filled 2020-08-27 (×5): qty 1

## 2020-08-27 MED ORDER — ONDANSETRON HCL 4 MG/2ML IJ SOLN: 4.0000 mg | Freq: Four times a day (QID) | INTRAMUSCULAR | Status: DC | PRN

## 2020-08-27 MED ORDER — POLYETHYLENE GLYCOL 3350 17 G PO PACK: 17.0000 g | PACK | Freq: Every day | ORAL | Status: DC | PRN

## 2020-08-27 MED ORDER — ENOXAPARIN SODIUM 40 MG/0.4ML ~~LOC~~ SOLN
40.0000 mg | SUBCUTANEOUS | Status: DC
  Administered 2020-08-27 – 2020-08-31 (×5): 40 mg via SUBCUTANEOUS
  Filled 2020-08-27 (×5): qty 0.4

## 2020-08-27 MED ORDER — CYANOCOBALAMIN 1000 MCG/ML IJ SOLN
1000.0000 ug | Freq: Once | INTRAMUSCULAR | Status: AC
  Administered 2020-08-27: 1000 ug via INTRAMUSCULAR
  Filled 2020-08-27: qty 1

## 2020-08-27 MED ORDER — PANTOPRAZOLE SODIUM 40 MG PO TBEC
40.0000 mg | DELAYED_RELEASE_TABLET | Freq: Every day | ORAL | Status: DC
  Administered 2020-08-27 – 2020-08-31 (×5): 40 mg via ORAL
  Filled 2020-08-27 (×5): qty 1

## 2020-08-27 MED ORDER — CLOPIDOGREL BISULFATE 75 MG PO TABS
75.0000 mg | ORAL_TABLET | Freq: Every day | ORAL | Status: DC
  Administered 2020-08-27 – 2020-08-31 (×5): 75 mg via ORAL
  Filled 2020-08-27 (×5): qty 1

## 2020-08-27 MED ORDER — SPIRONOLACTONE 12.5 MG HALF TABLET
12.5000 mg | ORAL_TABLET | Freq: Every day | ORAL | Status: DC
  Administered 2020-08-27 – 2020-08-31 (×5): 12.5 mg via ORAL
  Filled 2020-08-27 (×5): qty 1

## 2020-08-27 MED ORDER — ONDANSETRON HCL 4 MG PO TABS: 4.0000 mg | ORAL_TABLET | Freq: Four times a day (QID) | ORAL | Status: DC | PRN

## 2020-08-27 MED ORDER — HALOPERIDOL LACTATE 5 MG/ML IJ SOLN
5.0000 mg | Freq: Four times a day (QID) | INTRAMUSCULAR | Status: DC | PRN
  Administered 2020-08-27: 5 mg via INTRAVENOUS
  Filled 2020-08-27: qty 1

## 2020-08-27 MED ORDER — LORAZEPAM 2 MG/ML IJ SOLN
0.5000 mg | Freq: Once | INTRAMUSCULAR | Status: AC | PRN
  Administered 2020-08-27: 0.5 mg via INTRAVENOUS
  Filled 2020-08-27: qty 1

## 2020-08-27 MED ORDER — PREDNISONE 20 MG PO TABS
20.0000 mg | ORAL_TABLET | Freq: Every day | ORAL | Status: DC
  Administered 2020-08-27 – 2020-08-31 (×5): 20 mg via ORAL
  Filled 2020-08-27 (×5): qty 1

## 2020-08-27 MED ORDER — AMLODIPINE BESYLATE 5 MG PO TABS
5.0000 mg | ORAL_TABLET | Freq: Every day | ORAL | Status: DC
  Administered 2020-08-27 – 2020-08-31 (×5): 5 mg via ORAL
  Filled 2020-08-27 (×5): qty 1

## 2020-08-27 MED ORDER — NITROGLYCERIN 0.4 MG SL SUBL: 0.4000 mg | SUBLINGUAL_TABLET | SUBLINGUAL | Status: DC | PRN

## 2020-08-27 MED ORDER — SODIUM CHLORIDE 0.9 % IV SOLN: INTRAVENOUS | Status: DC

## 2020-08-27 NOTE — ED Notes (Signed)
Lunch Tray Ordered @ 1050. 

## 2020-08-27 NOTE — Progress Notes (Signed)
   08/27/20 1551  TOC Assessment  Expected Discharge Plan Memory Care  Barriers to Discharge Continued Medical Work up  Patient states their goals for this hospitalization and ongoing recovery are: get a place for dad  Living arrangements for the past 2 months Single Family Home  Lives with: Self  Current home services DME;Sitter  Discharge Planning Services CM Consult    RNCM and EDSW met with pt daughter regarding disposition plan for pt.  Daughter states pt is unsafe at home as he is becoming more forgetful and her hope is that he will go to a memory care unit from hospital.  Daughter states she has hired caregiver for 8 hours but will lose that person in 6 days and that 8 hours is not enough as pt wanders all day and night.  Daughter lives 2 hours away and her sibling is also not local.  RNCM left voicemail for pt [facility] [place]) for assistance.

## 2020-08-27 NOTE — Discharge Planning (Signed)
[facility]CM contacted [facility] to inform of pt admission and to obtain [facility] team information.  Pt active at [facility] MD: [place]: Gabriel Earing   Pager: N/A Desk phone: [phone]

## 2020-08-27 NOTE — H&P (Signed)
History and Physical    [name] ZOX:[id number] DOB: 03/04/1931 DOA: 08/26/2020  PCP: Shirline Frees, MD  Patient coming from: Home   Chief Complaint:  Chief Complaint  Patient presents with  . Altered Mental Status  . Urinary Tract Infection     HPI:    86 year old male with past medical history of coronary artery disease, hypertension, sick sinus syndrome status post pacemaker placement, hyperlipidemia, coronary artery disease (PCI to RCA in 2009), solitary kidney and recent diagnosis of myasthenia gravis in August 2021 during hospitalization at [facility] who presents to [facility] emergency department due to family concerns of patient's progressively worsening confusion and erratic behavior.  Patient is an extremely poor historian at this point and majority the history is been obtained from the daughter via phone conversation.  Patient's daughter explains that for the past several weeks ever since the initiation of prednisone by the patient's outpatient neurologist the patient has been exhibiting erratic behavior.  The patient lives alone and since the initiation of prednisone has appeared disheveled, has inconsistently eaten, has rarely showered and has "not made much sense."  Patient has exhibited bouts of disorientation and has been urinating on himself on a regular basis.  According to the daughter she has no knowledge of the patient exhibiting any new cough, shortness of breath, fevers, diarrhea, abdominal pain or any other signs of infection.  Of note, the patient was recently placed on daily prednisone several weeks ago due to complaints of difficulty swallowing and choking.  This is felt to be a manifestation of uncontrolled myasthenia gravis.  Patient is already receiving pyridostigmine and IVIG and therefore the there are neurologist initiated the patient on prednisone.  Initially this was started at 20 mg daily but when patient did not exhibit an adequate response  dosing was increased to 40 mg and this is when the patient began to exhibit substantial symptoms.  Patient's dosing has since been reduced to 20 mg daily but his symptoms of erratic behavior have yet to resolve.  Symptoms continue to worsen until the patient was eventually brought to [facility] emergency department for evaluation.  Upon evaluation in the emergency department work-up has been initiated to evaluate patient for other potential causes of encephalopathy.  Hospice group is now been called to assess the patient for mission the hospital    Review of Systems:   Review of Systems  Unable to perform ROS: Mental status change    Past Medical History:  Diagnosis Date  . Carotid artery disease ([place])   . Coronary artery disease    PCI RCA 2009  . Dyslipidemia   . Hypertension   . Scleritis, unspecified    treated with prednisone  . Seizure [facility])    felt due to cyclosporin in setting of electrolyte disorder  . Solitary kidney   . Symptomatic bradycardia    a. s/p STJ dual chamber pacemaker    Past Surgical History:  Procedure Laterality Date  . CARDIAC CATHETERIZATION     12 years ago?  . COLONOSCOPY    . EYE SURGERY     right eye about 10 years ago  . ORIF HUMERUS FRACTURE Right 07/03/2019   Procedure: Right distal humerus open reduction, internal fixation;  Surgeon: Verner Mould, MD;  Location: [place];  Service: Orthopedics;  Laterality: Right;  . PACEMAKER INSERTION  2009   STJ dual chamber pacemaker implanted by Dr Olevia Perches for symptomatic bradycardia     reports that  he has never smoked. He has never used smokeless tobacco. He reports current alcohol use. He reports that he does not use drugs.  Allergies  Allergen Reactions  . Cefuroxime Axetil Other (See Comments)    Reaction not recalled  . Cyclosporine Other (See Comments)    Reaction not recalled  . Maxzide [Triamterene-Hctz] Other (See Comments)    Adverse reaction- bloating and abdominal  pain  . Sulfa Antibiotics Other (See Comments)    Could not sleep/eat and experienced gastric pain  . Sulfamethoxazole Other (See Comments)    Could not sleep/eat and had gastric pain  . Sulfamethoxazole-Trimethoprim Other (See Comments)    Gastric pain and could not eat/sleep  . Sulfur Other (See Comments)    No energy, could not walk   . Prednisone Other (See Comments)    Can tolerate for a short period of time    Family History  Problem Relation Age of Onset  . Arthritis/Rheumatoid Mother   . Heart disease Mother   . COPD Father   . Alcohol abuse Son      Prior to Admission medications   Medication Sig Start Date End Date Taking? Authorizing Provider  amLODipine (NORVASC) 5 MG tablet Take 1 tablet by mouth once daily Patient taking differently: Take 5 mg by mouth daily. 07/19/20  Yes Fay Records, MD  aspirin 81 MG tablet Take 81 mg by mouth daily.  07/19/11  Yes Allred, Jeneen Rinks, MD  clopidogrel (PLAVIX) 75 MG tablet Take 1 tablet by mouth once daily Patient taking differently: No sig reported 06/29/20  Yes Fay Records, MD  finasteride (PROSCAR) 5 MG tablet Take 5 mg by mouth daily.   Yes [provider]  GAMMAGARD 5 GM/50ML SOLN every 21 ( twenty-one) days. 08/24/20  Yes [provider]  NITROSTAT 0.4 MG SL tablet DISSOLVE ONE TABLET UNDER THE TONGUE EVERY 5 MINUTES AS NEEDED FOR CHEST PAIN.  DO NOT EXCEED A TOTAL OF 3 DOSES IN 15 MINUTES Patient taking differently: Place 0.4 mg under the tongue every 5 (five) minutes x 3 doses as needed for chest pain. 11/19/18  Yes Fay Records, MD  pantoprazole (PROTONIX) 40 MG tablet Take 1 tablet (40 mg total) by mouth daily. Patient taking differently: Take 40 mg by mouth daily before breakfast. 08/06/20  Yes Patel, Donika K, DO  predniSONE (DELTASONE) 20 MG tablet Take 1 tablet (20 mg total) by mouth daily with breakfast. 08/16/20  Yes Patel, Donika K, DO  pyridostigmine (MESTINON) 60 MG tablet Take 1 tablet at 7am, 10am  1pm, and 5pm Patient taking differently: Take 60 mg by mouth See admin instructions. Take 60 mg by mouth at 7 AM, 10 AM, 1 PM, and 5 PM 07/16/20  Yes Patel, Donika K, DO  spironolactone (ALDACTONE) 25 MG tablet Take 0.5 tablets (12.5 mg total) by mouth daily. 07/19/20  Yes Fay Records, MD    Physical Exam: Vitals:   08/27/20 0430 08/27/20 0432 08/27/20 0500 08/27/20 0530  BP: (!) 171/103  (!) 146/110 (!) 152/54  Pulse: (!) 109  79 61  Resp: 19  (!) 26 (!) 24  Temp:      TempSrc:      SpO2: 93%  93% 97%  Weight:  70.3 kg    Height:  5\' 7"  (1.702 m)      Constitutional: Lethargic but arousable, oriented x3 no associated distress.   Skin: no rashes, no lesions, good skin turgor noted. Eyes: Pupils are equally reactive to  light.  No evidence of scleral icterus or conjunctival pallor.  ENMT: Moist mucous membranes noted.  Posterior pharynx clear of any exudate or lesions.   Neck: normal, supple, no masses, no thyromegaly.  No evidence of jugular venous distension.   Respiratory: clear to auscultation bilaterally, no wheezing, no crackles. Normal respiratory effort. No accessory muscle use.  Cardiovascular: Regular rate and rhythm, no murmurs / rubs / gallops. No extremity edema. 2+ pedal pulses. No carotid bruits.  Chest:   Nontender without crepitus or deformity.   Back:   Nontender without crepitus or deformity. Abdomen: Abdomen is soft and nontender.  No evidence of intra-abdominal masses.  Positive bowel sounds noted in all quadrants.   Musculoskeletal: No joint deformity upper and lower extremities. Good ROM, no contractures. Normal muscle tone.  Neurologic: Patient exhibiting tremor at rest.  CN 2-12 grossly intact. Sensation intact.  Patient moving all 4 extremities spontaneously.  Patient is following all commands.  Patient is responsive to verbal stimuli.  Psychiatric: Patient is exhibiting erratic behavior throughout the interview with periods of incoherent speech.  Patient does  not seem to currently possess insight as to his current situation.  Labs on Admission: I have personally reviewed following labs and imaging studies -   CBC: Recent Labs  Lab 08/26/20 1330 08/26/20 2208 08/27/20 0340  WBC 8.5  --  7.9  NEUTROABS  --   --  6.2  HGB 13.5 14.3 12.3*  HCT 40.8 42.0 37.5*  MCV 99.0  --  99.7  PLT 178  --  563   Basic Metabolic Panel: Recent Labs  Lab 08/26/20 1330 08/26/20 2208 08/27/20 0340  NA 132* 136 134*  K 4.3 4.3 3.6  CL 97*  --  100  CO2 27  --  26  GLUCOSE 117*  --  124*  BUN 42*  --  31*  CREATININE 1.09  --  1.07  CALCIUM 8.8*  --  8.2*  MG  --   --  2.1   GFR: Estimated Creatinine Clearance: 45.5 mL/min (by C-G formula based on SCr of 1.07 mg/dL). Liver Function Tests: Recent Labs  Lab 08/26/20 1330 08/27/20 0340  AST 25 25  ALT 35 29  ALKPHOS 97 74  BILITOT 0.5 0.6  PROT 8.9* 7.3  ALBUMIN 3.0* 2.6*   No results for input(s): LIPASE, AMYLASE in the last 168 hours. Recent Labs  Lab 08/26/20 2148  AMMONIA 20   Coagulation Profile: No results for input(s): INR, PROTIME in the last 168 hours. Cardiac Enzymes: Recent Labs  Lab 08/27/20 0340  CKTOTAL 35*   BNP (last 3 results) No results for input(s): PROBNP in the last 8760 hours. HbA1C: No results for input(s): HGBA1C in the last 72 hours. CBG: Recent Labs  Lab 08/26/20 1957  GLUCAP 110*   Lipid Profile: No results for input(s): CHOL, HDL, LDLCALC, TRIG, CHOLHDL, LDLDIRECT in the last 72 hours. Thyroid Function Tests: Recent Labs    08/26/20 2148  TSH 1.353   Anemia Panel: No results for input(s): VITAMINB12, FOLATE, FERRITIN, TIBC, IRON, RETICCTPCT in the last 72 hours. Urine analysis:    Component Value Date/Time   COLORURINE STRAW (A) 08/26/2020 1404   APPEARANCEUR CLEAR 08/26/2020 1404   APPEARANCEUR Cloudy (A) 08/03/2020 1420   LABSPEC 1.010 08/26/2020 1404   PHURINE 6.0 08/26/2020 1404   GLUCOSEU NEGATIVE 08/26/2020 1404   GLUCOSEU  NEGATIVE 08/14/2011 1058   HGBUR NEGATIVE 08/26/2020 1404   BILIRUBINUR NEGATIVE 08/26/2020 1404   BILIRUBINUR Negative  08/03/2020 1420   [place] 08/26/2020 1404   PROTEINUR NEGATIVE 08/26/2020 1404   UROBILINOGEN 0.2 08/14/2011 1058   NITRITE NEGATIVE 08/26/2020 1404   LEUKOCYTESUR NEGATIVE 08/26/2020 1404    Radiological Exams on Admission - Personally Reviewed: CT Head Wo Contrast  Result Date: 08/26/2020 CLINICAL DATA:  Mental status change increased confusion EXAM: CT HEAD WITHOUT CONTRAST TECHNIQUE: Contiguous axial images were obtained from the base of the skull through the vertex without intravenous contrast. COMPARISON:  05/12/2020 FINDINGS: Brain: No acute territorial infarction, hemorrhage, or intracranial mass. Mild atrophy. Mild hypodensity in the white matter likely chronic small vessel ischemic change. Stable ventricle size Vascular: No hyperdense vessels. Vertebral and carotid vascular calcification Skull: Normal. Negative for fracture or focal lesion. Sinuses/Orbits: No acute finding. Other: None IMPRESSION: 1. No CT evidence for acute intracranial abnormality. 2. Atrophy and mild chronic small vessel ischemic changes of the white matter. Electronically Signed   By: Donavan Foil M.D.   On: 08/26/2020 21:13   DG Chest Portable 1 View  Result Date: 08/26/2020 CLINICAL DATA:  Altered mental status. EXAM: PORTABLE CHEST 1 VIEW COMPARISON:  Chest x-ray 05/12/2020. FINDINGS: Oval-like 3.1 by 1 cm metallic density overlying the left neck likely external to the patient. Left chest wall 2 lead pacemaker in grossly similar position. The heart size and mediastinal contours are unchanged. Aortic arch and descending thoracic aorta atherosclerotic plaque. Biapical pleural/pulmonary scarring. Similar appear coarsened interstitial markings. Suggestion of hazy airspace opacity overlying the left mid to lower lung zone reticulations with silhouetting off of the left heart border. No  pulmonary edema. No pleural effusion. No pneumothorax. No acute osseous abnormality. IMPRESSION: 1. Left mid to lower lung zone hazy airspace opacity could represent infection/inflammation. Followup PA and lateral chest X-ray is recommended in 3-4 weeks following therapy to ensure resolution and exclude underlying malignancy. 2. Oval-like 3.1 by 1 cm metallic density overlying the left neck likely external to the patient. Please correlate with physical exam. Electronically Signed   By: Iven Finn M.D.   On: 08/26/2020 23:02    EKG: Personally reviewed.  Rhythm is sinus rhythm with sinus arrhythmia with heart rate of 78 bpm.  No dynamic ST segment changes appreciated.  Assessment/Plan Principal Problem:   Toxic metabolic encephalopathy   Patient presenting with several weeks of intermittent confusion and erratic behavior  Patient may be suffering from a degree of mild steroid-induced psychosis  Before the diagnosis is made however, we will perform a work-up to evaluate patient for causes of potential encephalopathy  Obtaining hepatic function panel, ammonia, CT head, urinalysis, chest x-ray, electrolytes  Gentle intravenous hydration  Emergency department provider has consulted Dr. Theda Sers with neurology, we appreciate his involvement and look forward to his input.  Per my discussion with Dr. Theda Sers, will continue patient on prednisone 20 mg daily at this time.  Active Problems:   Myasthenia gravis [facility])  Patient recently diagnosed in August while admitted here at [facility]  Patient currently being treated with pyridostigmine as well as IVIG infusions  Patient was recently initiated on prednisone several weeks ago due to complaints of dysphagia and choking, thought to be another manifestation of patient's myasthenia gravis  Despite initiation of prednisone and close follow-up with outpatient neurology patient continues to be symptomatic  Per my discussion with Dr. Theda Sers,  because of patient's ongoing symptoms and treatment failure despite multiple agents patient may benefit from plasmapheresis  We will discuss this possibility with daughter in conference with patient once the  patient's daughter is available in the morning    Benign prostatic hyperplasia with urinary frequency   Continue home regimen of finasteride    Essential hypertension   Continue home regimen of antihypertensive therapy    Coronary artery disease involving native coronary artery of native heart without angina pectoris    Patient currently chest pain-free  Continuing home regimen antiplatelet therapy  Monitoring patient on telemetry   Code Status:  Full code Family Communication: Case discussed with daughter via phone conversation and she has been updated on plan of care  Status is: Observation  The patient remains OBS appropriate and will d/c before 2 midnights.  Dispo: The patient is from: Home              Anticipated d/c is to: Home              Anticipated d/c date is: 2 days              Patient currently is not medically stable to d/c.        Vernelle Emerald MD [facility] Pager [phone]  If 7PM-7AM, please contact night-coverage [email or web address] Use universal Early password for that web site. If you do not have the password, please call the hospital operator.  08/27/2020, 5:40 AM

## 2020-08-27 NOTE — Progress Notes (Signed)
Subjective: No acute complaints  Exam: Vitals:   08/27/20 0800 08/27/20 1005  BP: (!) 130/114 (!) 162/71  Pulse: 94 84  Resp: (!) 26 (!) 21  Temp:    SpO2: 92% 97%   Gen: In bed, NAD Resp: non-labored breathing, no acute distress Abd: soft, nt Psych: He has mild psychomotor agitation, he has slightly pressured speech.  Neuro: MS: [place], alert, gives month as January, but then corrects to December.  He is unable to spell world backwards, eventually giving up and spelling it forwards again in and saying "oh that is right." CN: PERRL, very mild ptosis after 60 seconds of sustained upgaze Motor: He has no drift, though he is markedly tremulous bilaterally. Sensory: Intact light touch  Pertinent Labs: UA is negative B12 is borderline, will send methylmalonic acid TSH is normal   Impression: 86 year old male with disorganized thoughts, pressured speech in the setting of recently increasing prednisone.  This is by far and away most consistent with steroid-induced psychosis/mania.  This may take some time to gradually improve, I would be hesitant to rapidly dropping back to no prednisone, but given that he was tolerating the 20 mg daily, I would return to this dose.  I do not see any current indication for plasma exchange given that he is not exhibiting any signs of crisis.  Recommendations: 1) Mestinon 60 mg at 7 AM, 10 AM, 1 PM, 5 PM 2) Continue prednisone 20mg  dail 3) Would consider adding seroquel 25mg  qhs   Roland Rack, MD [facility] [phone]  If 7pm- 7am, please page neurology on call as listed in [place].

## 2020-08-27 NOTE — Progress Notes (Signed)
08/27/20 1122  PT Evaluation Information  Last PT Received On 08/27/20  Assistance Needed +1  PT/OT/SLP Co-Evaluation/Treatment Yes  Reason for Co-Treatment For patient/therapist safety;To address functional/ADL transfers  PT goals addressed during session Mobility/safety with mobility;Balance;Proper use of DME  History of Present Illness 86 year old male with past medical history of coronary artery disease, hypertension, sick sinus syndrome status post pacemaker placement, hyperlipidemia, coronary artery disease (PCI to RCA in 2009), solitary kidney and recent diagnosis of myasthenia gravis in August 2021 during hospitalization at [facility] who presents to [facility] emergency department due to family concerns of patient's progressively worsening confusion and erratic behavior. Found to have Toxic metabolic encephalopathy from prednisone.  Precautions  Precautions Fall  Restrictions  Weight Bearing Restrictions No  Home Living  Family/patient expects to be discharged to: Private residence  Living Arrangements Non-relatives/Friends;Children  Available Help at Discharge Family;Available 24 hours/day;Personal care attendant  Type of Home Apartment  Home Access Level entry  Home Layout One level  Bathroom Shower/Tub Walk-in shower  Bathroom Toilet Handicapped height  Bathroom Accessibility Yes  Home Equipment Shower seat - built in;Grab bars - tub/shower;Walker - 4 wheels  Prior Function  Level of Independence Needs assistance  Gait / Transfers Assistance Needed Uses rollator for mobility>  ADL's / [place] and or family help set things out for him, but he does all the basic ADLs by himself. Family and Aide to all IADLs, pt does not drive  Communication  Communication No difficulties  Pain Assessment  Pain Assessment No/denies pain  Cognition  Arousal/Alertness Awake/alert  Behavior During Therapy WFL for tasks assessed/performed  Overall Cognitive  Status No family/caregiver present to determine baseline cognitive functioning  General Comments Pt with mild confusion, but feel he is likely close to baseline.  Upper Extremity Assessment  Upper Extremity Assessment Defer to OT evaluation  Lower Extremity Assessment  Lower Extremity Assessment Overall WFL for tasks assessed  Cervical / Trunk Assessment  Cervical / Trunk Assessment Normal  Bed Mobility  Overal bed mobility Modified Independent  Transfers  Overall transfer level Needs assistance  Equipment used 4-wheeled walker  Transfers Sit to/from Stand  Sit to Stand Supervision  General transfer comment Supervision for safety. Cues to lock brakes on rollator prior to stand.  Ambulation/Gait  Ambulation/Gait assistance Min guard  Gait Distance (Feet) 120 Feet  Assistive device 4-wheeled walker  Gait Pattern/deviations Step-through pattern;Decreased stride length  General Gait Details Mild unsteadiness initially, however, improved with distance. No overt LOB noted. Demonstrated safe use of rollator. Pt reports feeling like he is at baseline.  Gait velocity Decreased  Balance  Overall balance assessment Mild deficits observed, not formally tested  PT - End of Session  Activity Tolerance Patient tolerated treatment well  Patient left in bed;with call bell/phone within reach (on stretcher in ED)  Nurse Communication Mobility status  PT Assessment  PT Recommendation/Assessment Patent does not need any further PT services  PT Visit Diagnosis Other abnormalities of gait and mobility (R26.89);Other symptoms and signs involving the nervous system (R29.898)  No Skilled PT All education completed;Patient will have necessary level of assist by caregiver at discharge  AM-PAC PT "6 Clicks" Mobility Outcome Measure (Version 2)  Help needed turning from your back to your side while in a flat bed without using bedrails? 4  Help needed moving from lying on your back to sitting on the side of a  flat bed without using bedrails? 4  Help needed moving to  and from a bed to a chair (including a wheelchair)? 3  Help needed standing up from a chair using your arms (e.g., wheelchair or bedside chair)? 3  Help needed to walk in hospital room? 3  Help needed climbing 3-5 steps with a railing?  3  6 Click Score 20  Consider Recommendation of Discharge To: Home with no services  PT Recommendation  Follow Up Recommendations No PT follow up;Supervision/Assistance - 24 hour  PT equipment None recommended by PT  Acute Rehab PT Goals  Patient Stated Goal to go home  PT Goal Formulation With patient  Time For Goal Achievement 08/27/20  Potential to Achieve Goals Good  PT Time Calculation  PT Start Time (ACUTE ONLY) 1006  PT Stop Time (ACUTE ONLY) 1020  PT Time Calculation (min) (ACUTE ONLY) 14 min  PT General Charges  $$ ACUTE PT VISIT 1 Visit  PT Evaluation  $PT Eval Low Complexity 1 Low    Patient evaluated by Physical Therapy with no further acute PT needs identified. All education has been completed and the patient has no further questions. Pt initially with mild unsteadiness, however, balance improved with distance. No overt LOB noted and required min guard for safety. Pt reports he has 24/7 caregiver at baseline and feels he is at baseline.  See below for any follow-up Physical Therapy or equipment needs. PT is signing off. Thank you for this referral. If needs change, please re-consult.  [name], PT, DPT  Acute Rehabilitation Services  Pager: [phone] Office: [phone]

## 2020-08-27 NOTE — ED Notes (Signed)
Attempted to call report, nurse unavailable to receive report at this time

## 2020-08-27 NOTE — ED Notes (Signed)
Dr Candida Peeling secured messaged  Can we change his orders for tele? I can't keep the cardiac monitor on this patient, he is climbing over the side rails, walking around the room. Pt has been re oriented numerous times

## 2020-08-27 NOTE — Progress Notes (Signed)
PROGRESS NOTE    [name]  IRC:[id number] DOB: 03/03/1931 DOA: 08/26/2020 PCP: Shirline Frees, MD   Brief Narrative:  Patient is a 86 year old male with past medical history of coronary artery disease status post PCI in 2009, hypertension, sick sinus syndrome status post pacemaker placement, hyperlipidemia, solitary kidney and recent diagnosis of myasthenia gravis in August 2021 presents to emergency department with progressively worsening confusion and erratic behavior.  Upon arrival to ED: Patient tachycardic, tachypneic, blood pressure labile, CT head no evidence of acute intracranial abnormality.  Chest x-ray negative for acute findings.  Neurology consulted.  Patient admitted for further evaluation and management of his altered mental status.  Assessment & Plan:   Acute metabolic encephalopathy: -Could be steroid-induced psychosis?  CT head is negative for acute findings.  Chest x-ray negative, COVID-19 negative, ammonia level B12, folate, CK, CRP, TSH, lactic acid, UA all came back negative. -Neurology consulted-recommended to continue Mestinon 60 mg at 7 AM, 10 AM, 1 PM, 5 PM, continue prednisone 20 mg daily -We will add Seroquel 25 mg nightly -No indication of plasmapheresis at this time-as patient is not exhibiting any signs of crisis. -Neurochecks. -On aspiration/seizure/fall precautions -We will consult PT/OT  Myasthenia gravis: -Recently diagnosed in August 2021.  Being treated with pyridostigmine as well as IVIG infusions.  Recently initiated on prednisone several weeks ago due to complaints of dysphagia and choking. -Continue prednisone and Mestinon as above as per neurology recommendations -Appreciate neurology help  Coronary artery disease status post PCI: Patient denies ACS symptoms. -Continue aspirin, Plavix, nitro as needed  BPH: Continue Proscar  Essential hypertension: Labile.  Continue amlodipine and Aldactone.  Monitor blood pressure  closely  GERD: Continue PPI  Symptomatic sick sinus syndrome status post pacemaker placement: Continue to monitor on telemetry.  Carotid artery stenosis: Continue aspirin and Plavix  DVT prophylaxis: Lovenox Code Status: Full code.  Patient's daughter tells me that she will let us know about his CODE STATUS as soon as possible  Family Communication:  None present at bedside.  Plan of care discussed with patient in length and he verbalized understanding and agreed with it. I called patient's daughter Shirlean Mylar and discussed about plan of care and she verbalized understanding.  Daughter is not sure about patient's CODE STATUS.  She tells me that she will let us know as soon as possible.  Disposition Plan: Likely SNF  Consultants:   Neurology  Procedures:   CT head  Antimicrobials:   None  Status is: Observation  Dispo: The patient is from: Home              Anticipated d/c is to: SNF              Anticipated d/c date is: 2 days              Patient currently is not medically stable to d/c.    Subjective: Patient seen and examined.  Appears very confused, agitated and tremulous.  Wishes to talk to his daughter.  Denies any complaints including headache, blurry vision, chest pain, shortness of breath, nausea or vomiting.  Objective: Vitals:   08/27/20 0800 08/27/20 1005 08/27/20 1100 08/27/20 1412  BP: (!) 130/114 (!) 162/71 121/66 97/70  Pulse: 94 84 97 87  Resp: (!) 26 (!) 21 20 16   Temp:      TempSrc:      SpO2: 92% 97% 98% 98%  Weight:      Height:  Intake/Output Summary (Last 24 hours) at 08/27/2020 1511 Last data filed at 08/27/2020 0443 Gross per 24 hour  Intake 740.13 ml  Output 700 ml  Net 40.13 ml   Filed Weights   08/26/20 1317 08/27/20 0432  Weight: 70.3 kg 70.3 kg    Examination:  General exam: Confused, tremulous, on room air, elderly Respiratory system: Clear to auscultation. Respiratory effort normal. Cardiovascular system: S1 & S2  heard, RRR. No JVD, murmurs, rubs, gallops or clicks. No pedal edema. Gastrointestinal system: Abdomen is nondistended, soft and nontender. No organomegaly or masses felt. Normal bowel sounds heard. Central nervous system: Confused and agitated Extremities: Symmetric 5 x 5 power. Skin: No rashes, lesions or ulcers   Data Reviewed: I have personally reviewed following labs and imaging studies  CBC: Recent Labs  Lab 08/26/20 1330 08/26/20 2208 08/27/20 0340  WBC 8.5  --  7.9  NEUTROABS  --   --  6.2  HGB 13.5 14.3 12.3*  HCT 40.8 42.0 37.5*  MCV 99.0  --  99.7  PLT 178  --  384   Basic Metabolic Panel: Recent Labs  Lab 08/26/20 1330 08/26/20 2208 08/27/20 0340  NA 132* 136 134*  K 4.3 4.3 3.6  CL 97*  --  100  CO2 27  --  26  GLUCOSE 117*  --  124*  BUN 42*  --  31*  CREATININE 1.09  --  1.07  CALCIUM 8.8*  --  8.2*  MG  --   --  2.1   GFR: Estimated Creatinine Clearance: 45.5 mL/min (by C-G formula based on SCr of 1.07 mg/dL). Liver Function Tests: Recent Labs  Lab 08/26/20 1330 08/27/20 0340  AST 25 25  ALT 35 29  ALKPHOS 97 74  BILITOT 0.5 0.6  PROT 8.9* 7.3  ALBUMIN 3.0* 2.6*   No results for input(s): LIPASE, AMYLASE in the last 168 hours. Recent Labs  Lab 08/26/20 2148  AMMONIA 20   Coagulation Profile: No results for input(s): INR, PROTIME in the last 168 hours. Cardiac Enzymes: Recent Labs  Lab 08/27/20 0340  CKTOTAL 35*   BNP (last 3 results) No results for input(s): PROBNP in the last 8760 hours. HbA1C: No results for input(s): HGBA1C in the last 72 hours. CBG: Recent Labs  Lab 08/26/20 1957  GLUCAP 110*   Lipid Profile: No results for input(s): CHOL, HDL, LDLCALC, TRIG, CHOLHDL, LDLDIRECT in the last 72 hours. Thyroid Function Tests: Recent Labs    08/26/20 2148  TSH 1.353   Anemia Panel: Recent Labs    08/27/20 0340  VITAMINB12 291  FOLATE 14.9   Sepsis Labs: Recent Labs  Lab 08/27/20 0313  LATICACIDVEN 1.7     Recent Results (from the past 240 hour(s))  Resp Panel by RT-PCR (Flu A&B, Covid) Nasopharyngeal Swab     Status: None   Collection Time: 08/27/20  8:27 AM   Specimen: Nasopharyngeal Swab; Nasopharyngeal(NP) swabs in vial transport medium  Result Value Ref Range Status   SARS Coronavirus 2 by RT PCR NEGATIVE NEGATIVE Final    Comment: (NOTE) SARS-CoV-2 target nucleic acids are NOT DETECTED.  The SARS-CoV-2 RNA is generally detectable in upper respiratory specimens during the acute phase of infection. The lowest concentration of SARS-CoV-2 viral copies this assay can detect is 138 copies/mL. A negative result does not preclude SARS-Cov-2 infection and should not be used as the sole basis for treatment or other patient management decisions. A negative result may occur with  improper specimen collection/handling,  submission of specimen other than nasopharyngeal swab, presence of viral mutation(s) within the areas targeted by this assay, and inadequate number of viral copies(<138 copies/mL). A negative result must be combined with clinical observations, patient history, and epidemiological information. The expected result is Negative.  Fact Sheet for Patients:  EntrepreneurPulse.com.au  Fact Sheet for Healthcare Providers:  IncredibleEmployment.be  This test is no t yet approved or cleared by the [place] FDA and  has been authorized for detection and/or diagnosis of SARS-CoV-2 by FDA under an Emergency Use Authorization (EUA). This EUA will remain  in effect (meaning this test can be used) for the duration of the COVID-19 declaration under Section 564(b)(1) of the Act, 21 U.S.C.section 360bbb-3(b)(1), unless the authorization is terminated  or revoked sooner.       Influenza A by PCR NEGATIVE NEGATIVE Final   Influenza B by PCR NEGATIVE NEGATIVE Final    Comment: (NOTE) The Xpert Xpress SARS-CoV-2/FLU/RSV plus assay is intended as an  aid in the diagnosis of influenza from Nasopharyngeal swab specimens and should not be used as a sole basis for treatment. Nasal washings and aspirates are unacceptable for Xpert Xpress SARS-CoV-2/FLU/RSV testing.  Fact Sheet for Patients: EntrepreneurPulse.com.au  Fact Sheet for Healthcare Providers: IncredibleEmployment.be  This test is not yet approved or cleared by the [place] FDA and has been authorized for detection and/or diagnosis of SARS-CoV-2 by FDA under an Emergency Use Authorization (EUA). This EUA will remain in effect (meaning this test can be used) for the duration of the COVID-19 declaration under Section 564(b)(1) of the Act, 21 U.S.C. section 360bbb-3(b)(1), unless the authorization is terminated or revoked.  Performed at [facility], [place] [address] [place], [place] [address]       Radiology Studies: CT Head Wo Contrast  Result Date: 08/26/2020 CLINICAL DATA:  Mental status change increased confusion EXAM: CT HEAD WITHOUT CONTRAST TECHNIQUE: Contiguous axial images were obtained from the base of the skull through the vertex without intravenous contrast. COMPARISON:  05/12/2020 FINDINGS: Brain: No acute territorial infarction, hemorrhage, or intracranial mass. Mild atrophy. Mild hypodensity in the white matter likely chronic small vessel ischemic change. Stable ventricle size Vascular: No hyperdense vessels. Vertebral and carotid vascular calcification Skull: Normal. Negative for fracture or focal lesion. Sinuses/Orbits: No acute finding. Other: None IMPRESSION: 1. No CT evidence for acute intracranial abnormality. 2. Atrophy and mild chronic small vessel ischemic changes of the white matter. Electronically Signed   By: Donavan Foil M.D.   On: 08/26/2020 21:13   DG Chest Portable 1 View  Result Date: 08/26/2020 CLINICAL DATA:  Altered mental status. EXAM: PORTABLE CHEST 1 VIEW COMPARISON:  Chest x-ray 05/12/2020.  FINDINGS: Oval-like 3.1 by 1 cm metallic density overlying the left neck likely external to the patient. Left chest wall 2 lead pacemaker in grossly similar position. The heart size and mediastinal contours are unchanged. Aortic arch and descending thoracic aorta atherosclerotic plaque. Biapical pleural/pulmonary scarring. Similar appear coarsened interstitial markings. Suggestion of hazy airspace opacity overlying the left mid to lower lung zone reticulations with silhouetting off of the left heart border. No pulmonary edema. No pleural effusion. No pneumothorax. No acute osseous abnormality. IMPRESSION: 1. Left mid to lower lung zone hazy airspace opacity could represent infection/inflammation. Followup PA and lateral chest X-ray is recommended in 3-4 weeks following therapy to ensure resolution and exclude underlying malignancy. 2. Oval-like 3.1 by 1 cm metallic density overlying the left neck likely external to the patient. Please correlate with physical  exam. Electronically Signed   By: Iven Finn M.D.   On: 08/26/2020 23:02    Scheduled Meds: . amLODipine  5 mg Oral Daily  . aspirin  81 mg Oral Daily  . clopidogrel  75 mg Oral Daily  . enoxaparin (LOVENOX) injection  40 mg Subcutaneous Q24H  . finasteride  5 mg Oral Daily  . pantoprazole  40 mg Oral QAC breakfast  . predniSONE  20 mg Oral Q breakfast  . pyridostigmine  60 mg Oral 4 times per day  . spironolactone  12.5 mg Oral Daily   Continuous Infusions: . sodium chloride Stopped (08/27/20 1244)     LOS: 0 days   Time spent: 45 minutes   Velina Drollinger Loann Quill, MD [facility]  If 7PM-7AM, please contact night-coverage [email or web address] 08/27/2020, 3:11 PM

## 2020-08-27 NOTE — ED Notes (Signed)
Pt left to floor with patient via stretcher

## 2020-08-27 NOTE — ED Notes (Signed)
Pt ambulated to bathroom with walker.

## 2020-08-27 NOTE — ED Notes (Signed)
Dr Candida Peeling secured messaged   I seen the new orders for fall precaution and seizures. Pt will not stay in bed continues to pace/walk in room and hallway. He get extremely agitated when re directed

## 2020-08-27 NOTE — ED Notes (Signed)
Dr Candida Peeling answered secured messaging and new ordered received from pt to come off tele monitor

## 2020-08-27 NOTE — ED Notes (Signed)
Bladder scan showed 80 mL.

## 2020-08-27 NOTE — Evaluation (Signed)
Occupational Therapy Evaluation and Discharge Patient Details Name: [name] MRN: [id number] DOB: 02/11/1931 Today's Date: 08/27/2020    History of Present Illness 86 year old male with past medical history of coronary artery disease, hypertension, sick sinus syndrome status post pacemaker placement, hyperlipidemia, coronary artery disease (PCI to RCA in 2009), solitary kidney and recent diagnosis of myasthenia gravis in August 2021 during hospitalization at [facility] who presents to [facility] emergency department due to family concerns of patient's progressively worsening confusion and erratic behavior. Found to have Toxic metabolic encephalopathy from prednisone.   Clinical Impression   This 59 male admitted with above presents to acute OT at an independent to Mod I level, no further OT needs, we will D/C from acute OT.    Follow Up Recommendations  No OT follow up    Equipment Recommendations  None recommended by OT       Precautions / Restrictions Precautions Precautions: None Restrictions Weight Bearing Restrictions: No      Mobility Bed Mobility Overal bed mobility: Independent                  Transfers Overall transfer level: Modified independent                    Balance Overall balance assessment: No apparent balance deficits (not formally assessed)                                         ADL either performed or assessed with clinical judgement   ADL Overall ADL's : At baseline                                             Vision Baseline Vision/History: Wears glasses Wears Glasses: Reading only Patient Visual Report: No change from baseline              Pertinent Vitals/Pain Pain Assessment: No/denies pain     Hand Dominance  right (but is ambidextrous)   Extremity/Trunk Assessment Upper Extremity Assessment Upper Extremity Assessment: Overall WFL for tasks assessed            Communication  No issues   Cognition Arousal/Alertness: Awake/alert Behavior During Therapy: WFL for tasks assessed/performed Overall Cognitive Status: Within Functional Limits for tasks assessed                                                Home Living Family/patient expects to be discharged to:: Private residence Living Arrangements: Non-relatives/Friends;Children Available Help at Discharge: Family;Available 24 hours/day;Personal care attendant Type of Home: Apartment Home Access: Level entry     Home Layout: One level     Bathroom Shower/Tub: Occupational psychologist: Handicapped height     Home Equipment: Shower seat - built in;Grab bars - tub/shower;Walker - 4 wheels          Prior Functioning/Environment Level of Independence: Needs assistance  Gait / Transfers Assistance Needed: Uses rollator for mobility> ADL's / Homemaking Assistance Needed: Aide and or family help set things out for him, but he does all the basic ADLs by himself. Family and Aide to all  IADLs, pt does not drive   Comments: USes rollator                 OT Goals(Current goals can be found in the care plan section) Acute Rehab OT Goals Patient Stated Goal: to go home  OT Frequency:             Co-evaluation PT/OT/SLP Co-Evaluation/Treatment: Yes Reason for Co-Treatment: For patient/therapist safety PT goals addressed during session: Mobility/safety with mobility;Balance;Proper use of DME;Strengthening/ROM OT goals addressed during session: Strengthening/ROM      AM-PAC OT "6 Clicks" Daily Activity     Outcome Measure Help from another person eating meals?: None Help from another person taking care of personal grooming?: None Help from another person toileting, which includes using toliet, bedpan, or urinal?: None Help from another person bathing (including washing, rinsing, drying)?: None Help from another person to put on and taking off regular upper  body clothing?: None Help from another person to put on and taking off regular lower body clothing?: None 6 Click Score: 24   End of Session Equipment Utilized During Treatment:  (rollator)  Activity Tolerance: Patient tolerated treatment well Patient left: in bed;with call bell/phone within reach  OT Visit Diagnosis: Muscle weakness (generalized) (M62.81)                Time: 0626-9485 OT Time Calculation (min): 13 min Charges:  OT General Charges $OT Visit: 1 Visit OT Evaluation $OT Eval Low Complexity: 1 Low  [address], OTR/L Acute [employer] Pager [phone] Office [phone]     Almon Register 08/27/2020, 10:33 AM

## 2020-08-28 LAB — COMPREHENSIVE METABOLIC PANEL
ALT: 33 U/L (ref 0–44)
AST: 29 U/L (ref 15–41)
Albumin: 2.9 g/dL — ABNORMAL LOW (ref 3.5–5.0)
Alkaline Phosphatase: 76 U/L (ref 38–126)
Anion gap: 8 (ref 5–15)
BUN: 28 mg/dL — ABNORMAL HIGH (ref 8–23)
CO2: 23 mmol/L (ref 22–32)
Calcium: 8.5 mg/dL — ABNORMAL LOW (ref 8.9–10.3)
Chloride: 102 mmol/L (ref 98–111)
Creatinine, Ser: 1.03 mg/dL (ref 0.61–1.24)
GFR, Estimated: >60 mL/min (ref >60)
Glucose, Bld: 96 mg/dL (ref 70–99)
Potassium: 3.7 mmol/L (ref 3.5–5.1)
Sodium: 133 mmol/L — ABNORMAL LOW (ref 135–145)
Total Bilirubin: 0.7 mg/dL (ref 0.3–1.2)
Total Protein: 7.6 g/dL (ref 6.5–8.1)

## 2020-08-28 LAB — CBC
HCT: 38.8 % — ABNORMAL LOW (ref 39.0–52.0)
Hemoglobin: 12.8 g/dL — ABNORMAL LOW (ref 13.0–17.0)
MCH: 31.8 pg (ref 26.0–34.0)
MCHC: 33 g/dL (ref 30.0–36.0)
MCV: 96.3 fL (ref 80.0–100.0)
Platelets: 154 10*3/uL (ref 150–400)
RBC: 4.03 MIL/uL — ABNORMAL LOW (ref 4.22–5.81)
RDW: 16 % — ABNORMAL HIGH (ref 11.5–15.5)
WBC: 6.6 10*3/uL (ref 4.0–10.5)
nRBC: 0 % (ref 0.0–0.2)

## 2020-08-28 LAB — URINE CULTURE: Culture: <10000 — AB

## 2020-08-28 MED ORDER — QUETIAPINE FUMARATE 25 MG PO TABS
25.0000 mg | ORAL_TABLET | Freq: Every day | ORAL | Status: DC
  Administered 2020-08-28 – 2020-08-31 (×4): 25 mg via ORAL
  Filled 2020-08-28 (×4): qty 1

## 2020-08-28 NOTE — Evaluation (Signed)
Clinical/Bedside Swallow Evaluation Patient Details  Name: [name] MRN: [id number] Date of Birth: 03/22/1931  Today's Date: 08/28/2020 Time: SLP Start Time (ACUTE ONLY): 3419 SLP Stop Time (ACUTE ONLY): 0929 SLP Time Calculation (min) (ACUTE ONLY): 13 min  Past Medical History:  Past Medical History:  Diagnosis Date  . Carotid artery disease ([place])   . Coronary artery disease    PCI RCA 2009  . Dyslipidemia   . Hypertension   . Scleritis, unspecified    treated with prednisone  . Seizure [facility])    felt due to cyclosporin in setting of electrolyte disorder  . Solitary kidney   . Symptomatic bradycardia    a. s/p STJ dual chamber pacemaker   Past Surgical History:  Past Surgical History:  Procedure Laterality Date  . CARDIAC CATHETERIZATION     12 years ago?  . COLONOSCOPY    . EYE SURGERY     right eye about 10 years ago  . ORIF HUMERUS FRACTURE Right 07/03/2019   Procedure: Right distal humerus open reduction, internal fixation;  Surgeon: Verner Mould, MD;  Location: [place];  Service: Orthopedics;  Laterality: Right;  . PACEMAKER INSERTION  2009   STJ dual chamber pacemaker implanted by Dr Olevia Perches for symptomatic bradycardia   HPI:  Pt is an 86 yo male presenting with worsening AMS. CTH and CXR negative. Previous MBS in August 2021 revealed a moderate pharyngeal dysphagia with severe vallecular residue with purees and solids, which he could not fully clear despite strategies. Trace penetration and aspiration x1 occurred with thin liquids, but cleared with reflexive cough. Dys 1 diet was started per pt preference with thin liquids. PMH includes: MG (dx in August 2021, recently started no steroid due to reports of dysphagia/choking), CAD, HTN, SSS s/p pacemaker, HLD, solitary kidney, seizure   Assessment / Plan / Recommendation Clinical Impression  Pt says that he had gradually advanced himself back to regular solids after initial SLP evaluation in August, until he  started to have more difficulty over the last few weeks with tougher foods, specifically referencing an episode with dry chicken. He has been avoiding foods that are dry and by doing so he has no subjective c/o dysphagia. His CXR is also clear this admission and no overt s/s of aspiration nor dysphagia are noted during trials. Pt was offered a mechanical soft diet but he politely declines so that he can make his own selections based on what he knows he has been able to eat at home. SLP will maintain current diet but f/u briefly for tolerance given hx and AMS that precipitated this admission, with medical w/u still underway. SLP Visit Diagnosis: Dysphagia, unspecified (R13.10)    Aspiration Risk  Mild aspiration risk    Diet Recommendation Regular;Thin liquid   Liquid Administration via: Cup;Straw Medication Administration: Whole meds with liquid Supervision: Patient able to self feed;Intermittent supervision to cue for compensatory strategies Compensations: Slow rate;Small sips/bites Postural Changes: Seated upright at 90 degrees;Remain upright for at least 30 minutes after po intake    Other  Recommendations Oral Care Recommendations: Oral care BID   Follow up Recommendations None      Frequency and Duration min 1 x/week  1 week       Prognosis Prognosis for Safe Diet Advancement: Good      Swallow Study   General HPI: Pt is an 86 yo male presenting with worsening AMS. CTH and CXR negative. Previous MBS in August 2021 revealed a moderate pharyngeal  dysphagia with severe vallecular residue with purees and solids, which he could not fully clear despite strategies. Trace penetration and aspiration x1 occurred with thin liquids, but cleared with reflexive cough. Dys 1 diet was started per pt preference with thin liquids. PMH includes: MG (dx in August 2021, recently started no steroid due to reports of dysphagia/choking), CAD, HTN, SSS s/p pacemaker, HLD, solitary kidney, seizure Type of  Study: Bedside Swallow Evaluation Previous Swallow Assessment: see HPI Diet Prior to this Study: Regular;Thin liquids Temperature Spikes Noted: No Respiratory Status: Room air History of Recent Intubation: No Behavior/Cognition: Alert;Cooperative;Pleasant mood Oral Cavity Assessment: Within Functional Limits Oral Care Completed by SLP: No Oral Cavity - Dentition: Dentures, top;Dentures, bottom (partials) Vision: Functional for self-feeding Self-Feeding Abilities: Able to feed self Patient Positioning: Upright in bed Baseline Vocal Quality: Normal Volitional Cough: Strong Volitional Swallow: Able to elicit    Oral/Motor/Sensory Function Overall Oral Motor/Sensory Function: Within functional limits   Ice Chips Ice chips: Not tested   Thin Liquid Thin Liquid: Within functional limits Presentation: Self Fed;Straw    Nectar Thick Nectar Thick Liquid: Not tested   Honey Thick Honey Thick Liquid: Not tested   Puree Puree: Within functional limits Presentation: Self Fed;Spoon   Solid     Solid: Within functional limits Presentation: Self Fed      Osie Bond., M.A. [place] Pager [phone] Office [phone]  08/28/2020,9:43 AM

## 2020-08-28 NOTE — Progress Notes (Addendum)
PROGRESS NOTE    [name]  WRU:[id number] DOB: 1931-08-17 DOA: 08/26/2020 PCP: Shirline Frees, MD   Brief Narrative:  Patient is a 86 year old male with past medical history of coronary artery disease status post PCI in 2009, hypertension, sick sinus syndrome status post pacemaker placement, hyperlipidemia, solitary kidney and recent diagnosis of myasthenia gravis in August 2021 presents to emergency department with progressively worsening confusion and erratic behavior.  Upon arrival to ED: Patient tachycardic, tachypneic, blood pressure labile, CT head no evidence of acute intracranial abnormality.  Chest x-ray negative for acute findings.  Neurology consulted.  Patient admitted for further evaluation and management of his altered mental status.  Assessment & Plan:   Acute metabolic encephalopathy: Improving -Could be steroid-induced psychosis?  CT head is negative for acute findings.  Chest x-ray negative, COVID-19 negative, ammonia level B12, folate, CK, CRP, TSH, lactic acid, UA all came back negative. -Neurology consulted-recommended to continue Mestinon 60 mg at 7 AM, 10 AM, 1 PM, 5 PM, continue prednisone 20 mg daily -Cont. Seroquel 25 mg nightly -No indication of plasmapheresis at this time-as patient is not exhibiting any signs of crisis. -Neurochecks. -On aspiration/seizure/fall precautions -consult PT/OT-recommend SNF   Myasthenia gravis: -Recently diagnosed in August 2021.  Being treated with pyridostigmine as well as IVIG infusions.  Recently initiated on prednisone several weeks ago due to complaints of dysphagia and choking. -Continue prednisone and Mestinon as above as per neurology recommendations -Appreciate neurology help  Coronary artery disease status post PCI: Patient denies ACS symptoms. -Continue aspirin, Plavix, nitro as needed  BPH: Continue Proscar  Essential hypertension: Blood pressure elevated this morning -continue amlodipine and  Aldactone.   -Added hydralazine 10 mg every 6 hours as needed IV for blood pressure more than 160/100.   -Monitor blood pressure closely  GERD: Continue PPI  Symptomatic sick sinus syndrome status post pacemaker placement: Continue to monitor on telemetry.  Carotid artery stenosis: Continue aspirin and Plavix  Mild hyponatremia: Sodium 133 -Repeat BMP tomorrow a.m.  History of dysphagia: -Evaluated by SLP recommend regular diet and thin liquid.  Mild aspiration risk.  DVT prophylaxis: Lovenox Code Status: Full code.  Family Communication:  None present at bedside.  Plan of care discussed with patient in length and he verbalized understanding and agreed with it.  Called patient's daughter & updated her about goals of care. She verbalized understanding.  Disposition Plan: Likely SNF  Consultants:   Neurology  Procedures:   CT head  Antimicrobials:   None  Status is: Observation  Dispo: The patient is from: Home              Anticipated d/c is to: SNF              Anticipated d/c date is: 2 days              Patient currently is not medically stable to d/c.    Subjective: Patient seen and examined.  Appears very pleasant this morning, alert and oriented x3.  Denies any complaints including headache, blurry vision, chest pain, shortness of breath, vomiting, abdominal pain, fever or chills.  Objective: Vitals:   08/27/20 1800 08/27/20 2327 08/28/20 0812 08/28/20 1124  BP: (!) 150/69 (!) 188/91 (!) 166/91 (!) 155/74  Pulse: 75 93 82 82  Resp: 18 20 16 20   Temp: 97.8 F (36.6 C) 97.7 F (36.5 C) 97.7 F (36.5 C) (!) 97.5 F (36.4 C)  TempSrc: Oral  Oral Oral  SpO2: 97% 95%  95% 97%  Weight:      Height:        Intake/Output Summary (Last 24 hours) at 08/28/2020 1159 Last data filed at 08/28/2020 1033 Gross per 24 hour  Intake 120 ml  Output 1105 ml  Net -985 ml   Filed Weights   08/26/20 1317 08/27/20 0432  Weight: 70.3 kg 70.3 kg     Examination: General exam: Appears calm and comfortable, elderly, on room air, communicating well Respiratory system: Clear to auscultation. Respiratory effort normal. Cardiovascular system: S1 & S2 heard, RRR. No JVD, murmurs, rubs, gallops or clicks. No pedal edema. Gastrointestinal system: Abdomen is nondistended, soft and nontender. No organomegaly or masses felt. Normal bowel sounds heard. Central nervous system: Alert and oriented. No focal neurological deficits. Extremities: Symmetric 5 x 5 power. Skin: No rashes, lesions or ulcers.  Data Reviewed: I have personally reviewed following labs and imaging studies  CBC: Recent Labs  Lab 08/26/20 1330 08/26/20 2208 08/27/20 0340 08/28/20 0026  WBC 8.5  --  7.9 6.6  NEUTROABS  --   --  6.2  --   HGB 13.5 14.3 12.3* 12.8*  HCT 40.8 42.0 37.5* 38.8*  MCV 99.0  --  99.7 96.3  PLT 178  --  150 371   Basic Metabolic Panel: Recent Labs  Lab 08/26/20 1330 08/26/20 2208 08/27/20 0340 08/28/20 0026  NA 132* 136 134* 133*  K 4.3 4.3 3.6 3.7  CL 97*  --  100 102  CO2 27  --  26 23  GLUCOSE 117*  --  124* 96  BUN 42*  --  31* 28*  CREATININE 1.09  --  1.07 1.03  CALCIUM 8.8*  --  8.2* 8.5*  MG  --   --  2.1  --    GFR: Estimated Creatinine Clearance: 47.2 mL/min (by C-G formula based on SCr of 1.03 mg/dL). Liver Function Tests: Recent Labs  Lab 08/26/20 1330 08/27/20 0340 08/28/20 0026  AST 25 25 29   ALT 35 29 33  ALKPHOS 97 74 76  BILITOT 0.5 0.6 0.7  PROT 8.9* 7.3 7.6  ALBUMIN 3.0* 2.6* 2.9*   No results for input(s): LIPASE, AMYLASE in the last 168 hours. Recent Labs  Lab 08/26/20 2148  AMMONIA 20   Coagulation Profile: No results for input(s): INR, PROTIME in the last 168 hours. Cardiac Enzymes: Recent Labs  Lab 08/27/20 0340  CKTOTAL 35*   BNP (last 3 results) No results for input(s): PROBNP in the last 8760 hours. HbA1C: No results for input(s): HGBA1C in the last 72 hours. CBG: Recent Labs   Lab 08/26/20 1957  GLUCAP 110*   Lipid Profile: No results for input(s): CHOL, HDL, LDLCALC, TRIG, CHOLHDL, LDLDIRECT in the last 72 hours. Thyroid Function Tests: Recent Labs    08/26/20 2148  TSH 1.353   Anemia Panel: Recent Labs    08/27/20 0340  VITAMINB12 291  FOLATE 14.9   Sepsis Labs: Recent Labs  Lab 08/27/20 0313  LATICACIDVEN 1.7    Recent Results (from the past 240 hour(s))  Urine culture     Status: Abnormal   Collection Time: 08/26/20  8:55 PM   Specimen: Urine, Random  Result Value Ref Range Status   Specimen Description URINE, RANDOM  Final   Special Requests NONE  Final   Culture (A)  Final    <10,000 COLONIES/mL INSIGNIFICANT GROWTH Performed at [facility], [place] [address], [place], [place] [address]    Report Status 08/28/2020  FINAL  Final  Resp Panel by RT-PCR (Flu A&B, Covid) Nasopharyngeal Swab     Status: None   Collection Time: 08/27/20  8:27 AM   Specimen: Nasopharyngeal Swab; Nasopharyngeal(NP) swabs in vial transport medium  Result Value Ref Range Status   SARS Coronavirus 2 by RT PCR NEGATIVE NEGATIVE Final    Comment: (NOTE) SARS-CoV-2 target nucleic acids are NOT DETECTED.  The SARS-CoV-2 RNA is generally detectable in upper respiratory specimens during the acute phase of infection. The lowest concentration of SARS-CoV-2 viral copies this assay can detect is 138 copies/mL. A negative result does not preclude SARS-Cov-2 infection and should not be used as the sole basis for treatment or other patient management decisions. A negative result may occur with  improper specimen collection/handling, submission of specimen other than nasopharyngeal swab, presence of viral mutation(s) within the areas targeted by this assay, and inadequate number of viral copies(<138 copies/mL). A negative result must be combined with clinical observations, patient history, and epidemiological information. The expected result is Negative.  Fact  Sheet for Patients:  EntrepreneurPulse.com.au  Fact Sheet for Healthcare Providers:  IncredibleEmployment.be  This test is no t yet approved or cleared by the [place] FDA and  has been authorized for detection and/or diagnosis of SARS-CoV-2 by FDA under an Emergency Use Authorization (EUA). This EUA will remain  in effect (meaning this test can be used) for the duration of the COVID-19 declaration under Section 564(b)(1) of the Act, 21 U.S.C.section 360bbb-3(b)(1), unless the authorization is terminated  or revoked sooner.       Influenza A by PCR NEGATIVE NEGATIVE Final   Influenza B by PCR NEGATIVE NEGATIVE Final    Comment: (NOTE) The Xpert Xpress SARS-CoV-2/FLU/RSV plus assay is intended as an aid in the diagnosis of influenza from Nasopharyngeal swab specimens and should not be used as a sole basis for treatment. Nasal washings and aspirates are unacceptable for Xpert Xpress SARS-CoV-2/FLU/RSV testing.  Fact Sheet for Patients: EntrepreneurPulse.com.au  Fact Sheet for Healthcare Providers: IncredibleEmployment.be  This test is not yet approved or cleared by the [place] FDA and has been authorized for detection and/or diagnosis of SARS-CoV-2 by FDA under an Emergency Use Authorization (EUA). This EUA will remain in effect (meaning this test can be used) for the duration of the COVID-19 declaration under Section 564(b)(1) of the Act, 21 U.S.C. section 360bbb-3(b)(1), unless the authorization is terminated or revoked.  Performed at [facility], [place] [address] [place], [place] [address]       Radiology Studies: CT Head Wo Contrast  Result Date: 08/26/2020 CLINICAL DATA:  Mental status change increased confusion EXAM: CT HEAD WITHOUT CONTRAST TECHNIQUE: Contiguous axial images were obtained from the base of the skull through the vertex without intravenous contrast. COMPARISON:   05/12/2020 FINDINGS: Brain: No acute territorial infarction, hemorrhage, or intracranial mass. Mild atrophy. Mild hypodensity in the white matter likely chronic small vessel ischemic change. Stable ventricle size Vascular: No hyperdense vessels. Vertebral and carotid vascular calcification Skull: Normal. Negative for fracture or focal lesion. Sinuses/Orbits: No acute finding. Other: None IMPRESSION: 1. No CT evidence for acute intracranial abnormality. 2. Atrophy and mild chronic small vessel ischemic changes of the white matter. Electronically Signed   By: Donavan Foil M.D.   On: 08/26/2020 21:13   DG Chest Portable 1 View  Result Date: 08/26/2020 CLINICAL DATA:  Altered mental status. EXAM: PORTABLE CHEST 1 VIEW COMPARISON:  Chest x-ray 05/12/2020. FINDINGS: Oval-like 3.1 by 1 cm metallic density  overlying the left neck likely external to the patient. Left chest wall 2 lead pacemaker in grossly similar position. The heart size and mediastinal contours are unchanged. Aortic arch and descending thoracic aorta atherosclerotic plaque. Biapical pleural/pulmonary scarring. Similar appear coarsened interstitial markings. Suggestion of hazy airspace opacity overlying the left mid to lower lung zone reticulations with silhouetting off of the left heart border. No pulmonary edema. No pleural effusion. No pneumothorax. No acute osseous abnormality. IMPRESSION: 1. Left mid to lower lung zone hazy airspace opacity could represent infection/inflammation. Followup PA and lateral chest X-ray is recommended in 3-4 weeks following therapy to ensure resolution and exclude underlying malignancy. 2. Oval-like 3.1 by 1 cm metallic density overlying the left neck likely external to the patient. Please correlate with physical exam. Electronically Signed   By: Iven Finn M.D.   On: 08/26/2020 23:02    Scheduled Meds: . amLODipine  5 mg Oral Daily  . aspirin  81 mg Oral Daily  . clopidogrel  75 mg Oral Daily  . enoxaparin  (LOVENOX) injection  40 mg Subcutaneous Q24H  . finasteride  5 mg Oral Daily  . pantoprazole  40 mg Oral QAC breakfast  . predniSONE  20 mg Oral Q breakfast  . pyridostigmine  60 mg Oral 4 times per day  . QUEtiapine  25 mg Oral QHS  . spironolactone  12.5 mg Oral Daily   Continuous Infusions: . sodium chloride 125 mL/hr at 08/28/20 0946     LOS: 1 day   Time spent: 45 minutes   Arieal Cuoco Loann Quill, MD [facility]  If 7PM-7AM, please contact night-coverage [email or web address] 08/28/2020, 11:59 AM

## 2020-08-28 NOTE — Progress Notes (Signed)
Subjective: Seems less pressured today.   On discussion, he remembers having more MG symptoms when at Dr. Ena Dawley back in Oct, but feels but much better from that standpoint now.   Exam: Vitals:   08/28/20 0812 08/28/20 1124  BP: (!) 166/91 (!) 155/74  Pulse: 82 82  Resp: 16 20  Temp: 97.7 F (36.5 C) (!) 97.5 F (36.4 C)  SpO2: 95% 97%   Gen: In bed, NAD Resp: non-labored breathing, no acute distress Abd: soft, nt  Neuro: MS: awake, alert, oriented to monht, year, knows Monico Hoar is coming.  NL:ZJQBH, EOMI,  Motor: MAEW Sensory:intact to LT  Pertinent Labs: Na 133  Impression:  86 year old male with disorganized thoughts, pressured speech in the setting of recently increasing prednisone.  This is by far and away most consistent with steroid-induced psychosis/mania.  This may take some time to gradually improve, but his improvemetn overnight is a good sign.   I do not see any current indication for plasma exchange given that he is not exhibiting any signs of crisis.   Recommendations: 1)Continue seroquel 25mg  QHS  Roland Rack, MD [facility] [phone]  If 7pm- 7am, please page neurology on call as listed in [place].

## 2020-08-28 NOTE — Plan of Care (Signed)
  Problem: Nutrition: Goal: Adequate nutrition will be maintained Outcome: Progressing   Problem: Activity: Goal: Risk for activity intolerance will decrease Outcome: Progressing   Problem: Coping: Goal: Level of anxiety will decrease Outcome: Progressing   Problem: Safety: Goal: Ability to remain free from injury will improve Outcome: Progressing

## 2020-08-28 NOTE — Evaluation (Signed)
Physical Therapy Evaluation & Discharge Patient Details Name: [name] MRN: [id number] DOB: 11-30-1930 Today's Date: 08/28/2020   History of Present Illness  86 year old male with past medical history of coronary artery disease, hypertension, sick sinus syndrome status post pacemaker placement, hyperlipidemia, coronary artery disease (PCI to RCA in 2009), solitary kidney and recent diagnosis of myasthenia gravis in August 2021 during hospitalization at [facility] who presents to [facility] emergency department due to family concerns of patient's progressively worsening confusion and erratic behavior. Found to have Toxic metabolic encephalopathy from prednisone.  Clinical Impression  Pt presents with increased confusion, but appears to be at baseline level of functional status. He requires min guard-supervision for all mobility with use of a rollator primarily due to his decline in memory. For example, he initially believed he had met this therapist many times prior to this treatment, but in reality we had never met. In addition, he thought his room number was 38 and even found and tried to enter this room, passing his correct room several times, and was cued that his room was really 11 before he found his room. He is A&Ox4 though and reports feeling he is back at ~85% of his norm cognitive status, but the RN stated he continues to roam the halls at night. Thus, recommending d/c to SNF/memory care facility as pt's family and aide cannot provide the 24/7 supervision necessary at this time. All education completed with no further questions. Pt does not need further acute PT services at this time, therefore signing off.    Follow Up Recommendations Supervision/Assistance - 24 hour;SNF (memory care facility)    Equipment Recommendations  None recommended by PT    Recommendations for Other Services       Precautions / Restrictions Precautions Precautions: Fall Restrictions Weight  Bearing Restrictions: No      Mobility  Bed Mobility               General bed mobility comments: Pt sitting up in recliner upon arrival.    Transfers Overall transfer level: Needs assistance Equipment used: 4-wheeled walker Transfers: Sit to/from Stand Sit to Stand: Supervision         General transfer comment: Supervision for safety. Cues to lock brakes on rollator prior to stand.  Ambulation/Gait Ambulation/Gait assistance: Min Gaffer (Feet): 700 Feet Assistive device: 4-wheeled walker Gait Pattern/deviations: Step-through pattern;WFL(Within Functional Limits) Gait velocity: Decreased Gait velocity interpretation: >2.62 ft/sec, indicative of community ambulatory General Gait Details: Pt increased step length once exited room and gained momentum. Pt kept rollator within safe distance to body, no LOB noted. Min guard - supervision for safety.  Stairs            Wheelchair Mobility    Modified Rankin (Stroke Patients Only)       Balance Overall balance assessment: Mild deficits observed, not formally tested (some shakiness with gait/standing but no overt trunk sway or LOB)                                           Pertinent Vitals/Pain Pain Assessment: Faces Faces Pain Scale: No hurt Pain Intervention(s): Limited activity within patient's tolerance;Monitored during session;Repositioned    Home Living Family/patient expects to be discharged to:: Skilled nursing facility [facility] care facility) Living Arrangements: Non-relatives/Friends;Children Available Help at Discharge: Family;Available 24 hours/day;Personal care attendant Type of Home: [place]  Access: Level entry     Home Layout: One level Home Equipment: Shower seat - built in;Grab bars - tub/shower;Walker - 4 wheels Additional Comments: Info above based on prior living arrangements in private residence, but now planning for d/c to SNF/memory  care facility as family and caregiver can no longer provide 24/7 assistance/supervision.    Prior Function Level of Independence: Needs assistance   Gait / Transfers Assistance Needed: Uses rollator for mobility.  ADL's / Homemaking Assistance Needed: Aide and or family help set things out for him, but he does all the basic ADLs by himself. Family and Aide to all IADLs, pt does not drive        Hand Dominance   Dominant Hand: Right    Extremity/Trunk Assessment   Upper Extremity Assessment Upper Extremity Assessment: Defer to OT evaluation    Lower Extremity Assessment Lower Extremity Assessment: Overall WFL for tasks assessed    Cervical / Trunk Assessment Cervical / Trunk Assessment: Normal  Communication   Communication: No difficulties  Cognition Arousal/Alertness: Awake/alert Behavior During Therapy: WFL for tasks assessed/performed Overall Cognitive Status: No family/caregiver present to determine baseline cognitive functioning                                 General Comments: Pt A&Ox4 and reports feeling 85% back to baseline cognitively. However, upon arrival pt initially stated he had worked with this therapist often, but therapist had not met pt before. Per RN, pt still wandering halls at night last night. Pt cued to find his room after walking in halss for a bit and he stated his room number was "38" when it is actually 11. Pt passed his room several times and attempted to enter room 38. Provided pt with correct room number and pt then able to read signs and find his room independently.      General Comments      Exercises     Assessment/Plan    PT Assessment Patent does not need any further PT services  PT Problem List         PT Treatment Interventions      PT Goals (Current goals can be found in the Care Plan section)  Acute Rehab PT Goals Patient Stated Goal: to improve PT Goal Formulation: With patient Time For Goal Achievement:  08/30/20 Potential to Achieve Goals: Good    Frequency     Barriers to discharge        Co-evaluation PT/OT/SLP Co-Evaluation/Treatment: Yes             AM-PAC PT "6 Clicks" Mobility  Outcome Measure Help needed turning from your back to your side while in a flat bed without using bedrails?: None Help needed moving from lying on your back to sitting on the side of a flat bed without using bedrails?: None Help needed moving to and from a bed to a chair (including a wheelchair)?: A Little Help needed standing up from a chair using your arms (e.g., wheelchair or bedside chair)?: A Little Help needed to walk in hospital room?: A Little Help needed climbing 3-5 steps with a railing? : A Little 6 Click Score: 20    End of Session Equipment Utilized During Treatment: Gait belt Activity Tolerance: Patient tolerated treatment well Patient left: with call bell/phone within reach;in chair;with chair alarm set;with nursing/sitter in room Nurse Communication: Mobility status PT Visit Diagnosis: Other abnormalities of gait and mobility (  R26.89);Other symptoms and signs involving the nervous system (R29.898)    Time: 9704-4925 PT Time Calculation (min) (ACUTE ONLY): 19 min   Charges:   PT Evaluation $PT Eval Low Complexity: 1 Low          Moishe Spice, PT, DPT Acute Rehabilitation Services  Pager: [phone] Office: [phone]   Orvan Falconer 08/28/2020, 10:47 AM

## 2020-08-29 LAB — CBC
HCT: 38 % — ABNORMAL LOW (ref 39.0–52.0)
Hemoglobin: 13 g/dL (ref 13.0–17.0)
MCH: 32.9 pg (ref 26.0–34.0)
MCHC: 34.2 g/dL (ref 30.0–36.0)
MCV: 96.2 fL (ref 80.0–100.0)
Platelets: 136 10*3/uL — ABNORMAL LOW (ref 150–400)
RBC: 3.95 MIL/uL — ABNORMAL LOW (ref 4.22–5.81)
RDW: 16.1 % — ABNORMAL HIGH (ref 11.5–15.5)
WBC: 5 10*3/uL (ref 4.0–10.5)
nRBC: 0 % (ref 0.0–0.2)

## 2020-08-29 LAB — BASIC METABOLIC PANEL
Anion gap: 8 (ref 5–15)
BUN: 18 mg/dL (ref 8–23)
CO2: 26 mmol/L (ref 22–32)
Calcium: 8.6 mg/dL — ABNORMAL LOW (ref 8.9–10.3)
Chloride: 103 mmol/L (ref 98–111)
Creatinine, Ser: 1 mg/dL (ref 0.61–1.24)
GFR, Estimated: >60 mL/min (ref >60)
Glucose, Bld: 84 mg/dL (ref 70–99)
Potassium: 3.9 mmol/L (ref 3.5–5.1)
Sodium: 137 mmol/L (ref 135–145)

## 2020-08-29 MED ORDER — HYDRALAZINE HCL 20 MG/ML IJ SOLN: 10.0000 mg | Freq: Four times a day (QID) | INTRAMUSCULAR | Status: DC | PRN

## 2020-08-29 NOTE — Progress Notes (Signed)
PROGRESS NOTE    [name]  BJS:[id number] DOB: 1930/11/25 DOA: 08/26/2020 PCP: Shirline Frees, MD   Brief Narrative:  Patient is a 86 year old male with past medical history of coronary artery disease status post PCI in 2009, hypertension, sick sinus syndrome status post pacemaker placement, hyperlipidemia, solitary kidney and recent diagnosis of myasthenia gravis in August 2021 presents to emergency department with progressively worsening confusion and erratic behavior.  Upon arrival to ED: Patient tachycardic, tachypneic, blood pressure labile, CT head no evidence of acute intracranial abnormality.  Chest x-ray negative for acute findings.  Neurology consulted.  Patient admitted for further evaluation and management of his altered mental status.  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  CT head is negative for acute findings.  Chest x-ray negative, COVID-19 negative, ammonia level B12, folate, CK, CRP, TSH, lactic acid, UA all came back negative. -Neurology consulted-recommended to continue Mestinon 60 mg at 7 AM, 10 AM, 1 PM, 5 PM, continue prednisone 20 mg daily -Cont. Seroquel 25 mg nightly -No indication of plasmapheresis at this time-as patient is not exhibiting any signs of crisis. -Neurochecks. -On aspiration/seizure/fall precautions -consult PT/OT-recommend SNF   Myasthenia gravis: -Recently diagnosed in August 2021.  Being treated with pyridostigmine as well as IVIG infusions.  Recently initiated on prednisone several weeks ago due to complaints of dysphagia and choking. -Continue prednisone and Mestinon as above as per neurology recommendations -Appreciate neurology help  Coronary artery disease status post PCI: Patient denies ACS symptoms. -Continue aspirin, Plavix, nitro as needed  BPH: Continue Proscar  Essential hypertension: Blood pressure elevated this morning -continue amlodipine and  Aldactone.   -Continue hydralazine 10 mg every 6 hours as needed IV for blood pressure more than 160/100.  DC IVF. -Monitor blood pressure closely  GERD: Continue PPI  Symptomatic sick sinus syndrome status post pacemaker placement: Continue to monitor on telemetry.  Carotid artery stenosis: Continue aspirin and Plavix  Mild hyponatremia: Resolved  History of dysphagia: -Evaluated by SLP recommend regular diet and thin liquid.  Mild aspiration risk.  Thrombocytopenia: Platelet: 136 this morning.  No signs of active bleeding.  Repeat CBC tomorrow AM.  Disposition: Patient is clinically improving overall.  Case manager working on patient's SNF bed placement  DVT prophylaxis: Lovenox Code Status: Full code.  Family Communication: Patient's daughter present at bedside.  Plan of care discussed with patient in length and he verbalized understanding and agreed with it.  Disposition Plan: SNF  Consultants:   Neurology  Procedures:   CT head  Antimicrobials:   None  Status is: Observation  Dispo: The patient is from: Home              Anticipated d/c is to: SNF              Anticipated d/c date is: 1 to 2 days              Patient currently is not medically stable to d/c.    Subjective: Patient seen and examined.  Daughter at bedside.  Patient denies any new complaints.  No headache, blurry vision, chest pain, shortness of breath, nausea, vomiting, abdominal pain, nausea, vomiting, leg swelling.  Tells me that his appetite is good and denies urinary or bowel changes.  Objective: Vitals:   08/28/20 2033 08/29/20 0018 08/29/20 0321 08/29/20 1149  BP: (!) 181/90 136/67 (!) 180/66 (!) 169/72  Pulse: 79 63 65 78  Resp:  18 18 18   Temp:  97.6 F (36.4 C) 98.3 F (36.8 C) 98.3 F (36.8 C) 97.7 F (36.5 C)  TempSrc: Oral  Oral Oral  SpO2: 97% 97% 96% 99%  Weight:      Height:        Intake/Output Summary (Last 24 hours) at 08/29/2020 1214 Last data filed at  08/29/2020 1950 Gross per 24 hour  Intake 1040 ml  Output 2850 ml  Net -1810 ml   Filed Weights   08/26/20 1317 08/27/20 0432  Weight: 70.3 kg 70.3 kg    Examination: General exam: Appears calm and comfortable, elderly, on room air, communicating well, very pleasant to talk Respiratory system: Clear to auscultation. Respiratory effort normal. Cardiovascular system: S1 & S2 heard, RRR. No JVD, murmurs, rubs, gallops or clicks. No pedal edema. Gastrointestinal system: Abdomen is nondistended, soft and nontender. No organomegaly or masses felt. Normal bowel sounds heard. Central nervous system: Alert and oriented. No focal neurological deficits. Extremities: Symmetric 5 x 5 power. Skin: No rashes, lesions or ulcers. Psychiatry: Judgement and insight appear normal. Mood & affect appropriate.  Data Reviewed: I have personally reviewed following labs and imaging studies  CBC: Recent Labs  Lab 08/26/20 1330 08/26/20 2208 08/27/20 0340 08/28/20 0026 08/29/20 0145  WBC 8.5  --  7.9 6.6 5.0  NEUTROABS  --   --  6.2  --   --   HGB 13.5 14.3 12.3* 12.8* 13.0  HCT 40.8 42.0 37.5* 38.8* 38.0*  MCV 99.0  --  99.7 96.3 96.2  PLT 178  --  150 154 932*   Basic Metabolic Panel: Recent Labs  Lab 08/26/20 1330 08/26/20 2208 08/27/20 0340 08/28/20 0026 08/29/20 0145  NA 132* 136 134* 133* 137  K 4.3 4.3 3.6 3.7 3.9  CL 97*  --  100 102 103  CO2 27  --  26 23 26   GLUCOSE 117*  --  124* 96 84  BUN 42*  --  31* 28* 18  CREATININE 1.09  --  1.07 1.03 1.00  CALCIUM 8.8*  --  8.2* 8.5* 8.6*  MG  --   --  2.1  --   --    GFR: Estimated Creatinine Clearance: 48.7 mL/min (by C-G formula based on SCr of 1 mg/dL). Liver Function Tests: Recent Labs  Lab 08/26/20 1330 08/27/20 0340 08/28/20 0026  AST 25 25 29   ALT 35 29 33  ALKPHOS 97 74 76  BILITOT 0.5 0.6 0.7  PROT 8.9* 7.3 7.6  ALBUMIN 3.0* 2.6* 2.9*   No results for input(s): LIPASE, AMYLASE in the last 168 hours. Recent Labs   Lab 08/26/20 2148  AMMONIA 20   Coagulation Profile: No results for input(s): INR, PROTIME in the last 168 hours. Cardiac Enzymes: Recent Labs  Lab 08/27/20 0340  CKTOTAL 35*   BNP (last 3 results) No results for input(s): PROBNP in the last 8760 hours. HbA1C: No results for input(s): HGBA1C in the last 72 hours. CBG: Recent Labs  Lab 08/26/20 1957  GLUCAP 110*   Lipid Profile: No results for input(s): CHOL, HDL, LDLCALC, TRIG, CHOLHDL, LDLDIRECT in the last 72 hours. Thyroid Function Tests: Recent Labs    08/26/20 2148  TSH 1.353   Anemia Panel: Recent Labs    08/27/20 0340  VITAMINB12 291  FOLATE 14.9   Sepsis Labs: Recent Labs  Lab 08/27/20 0313  LATICACIDVEN 1.7    Recent Results (from the past 240 hour(s))  Urine culture     Status: Abnormal   Collection  Time: 08/26/20  8:55 PM   Specimen: Urine, Random  Result Value Ref Range Status   Specimen Description URINE, RANDOM  Final   Special Requests NONE  Final   Culture (A)  Final    <10,000 COLONIES/mL INSIGNIFICANT GROWTH Performed at [facility], [address] [place], [place] [address]    Report Status 08/28/2020 FINAL  Final  Resp Panel by RT-PCR (Flu A&B, Covid) Nasopharyngeal Swab     Status: None   Collection Time: 08/27/20  8:27 AM   Specimen: Nasopharyngeal Swab; Nasopharyngeal(NP) swabs in vial transport medium  Result Value Ref Range Status   SARS Coronavirus 2 by RT PCR NEGATIVE NEGATIVE Final    Comment: (NOTE) SARS-CoV-2 target nucleic acids are NOT DETECTED.  The SARS-CoV-2 RNA is generally detectable in upper respiratory specimens during the acute phase of infection. The lowest concentration of SARS-CoV-2 viral copies this assay can detect is 138 copies/mL. A negative result does not preclude SARS-Cov-2 infection and should not be used as the sole basis for treatment or other patient management decisions. A negative result may occur with  improper specimen  collection/handling, submission of specimen other than nasopharyngeal swab, presence of viral mutation(s) within the areas targeted by this assay, and inadequate number of viral copies(<138 copies/mL). A negative result must be combined with clinical observations, patient history, and epidemiological information. The expected result is Negative.  Fact Sheet for Patients:  EntrepreneurPulse.com.au  Fact Sheet for Healthcare Providers:  IncredibleEmployment.be  This test is no t yet approved or cleared by the [place] FDA and  has been authorized for detection and/or diagnosis of SARS-CoV-2 by FDA under an Emergency Use Authorization (EUA). This EUA will remain  in effect (meaning this test can be used) for the duration of the COVID-19 declaration under Section 564(b)(1) of the Act, 21 U.S.C.section 360bbb-3(b)(1), unless the authorization is terminated  or revoked sooner.       Influenza A by PCR NEGATIVE NEGATIVE Final   Influenza B by PCR NEGATIVE NEGATIVE Final    Comment: (NOTE) The Xpert Xpress SARS-CoV-2/FLU/RSV plus assay is intended as an aid in the diagnosis of influenza from Nasopharyngeal swab specimens and should not be used as a sole basis for treatment. Nasal washings and aspirates are unacceptable for Xpert Xpress SARS-CoV-2/FLU/RSV testing.  Fact Sheet for Patients: EntrepreneurPulse.com.au  Fact Sheet for Healthcare Providers: IncredibleEmployment.be  This test is not yet approved or cleared by the [place] FDA and has been authorized for detection and/or diagnosis of SARS-CoV-2 by FDA under an Emergency Use Authorization (EUA). This EUA will remain in effect (meaning this test can be used) for the duration of the COVID-19 declaration under Section 564(b)(1) of the Act, 21 U.S.C. section 360bbb-3(b)(1), unless the authorization is terminated or revoked.  Performed at [facility], [place] [address], [place],  [address]       Radiology Studies: No results found.  Scheduled Meds: . amLODipine  5 mg Oral Daily  . aspirin  81 mg Oral Daily  . clopidogrel  75 mg Oral Daily  . enoxaparin (LOVENOX) injection  40 mg Subcutaneous Q24H  . finasteride  5 mg Oral Daily  . pantoprazole  40 mg Oral QAC breakfast  . predniSONE  20 mg Oral Q breakfast  . pyridostigmine  60 mg Oral 4 times per day  . QUEtiapine  25 mg Oral QHS  . spironolactone  12.5 mg Oral Daily   Continuous Infusions:    LOS:  2 days   Time spent: 45 minutes   Regina Ganci Loann Quill, MD [facility]  If 7PM-7AM, please contact night-coverage [email or web address] 08/29/2020, 12:14 PM

## 2020-08-29 NOTE — TOC Initial Note (Signed)
Transition of Care [facility]) - Initial/Assessment Note    Patient Details  Name: [name] MRN: [id number] Date of Birth: Mar 21, 1931  [facility]) CM/SW Contact:    Coralee Pesa, [place] Phone Number: 08/29/2020, 12:10 PM  Clinical Narrative:                 CSW spoke with dtr, [name], at bedside, pt was sleeping. [name] is agreeable to SNF placement and notes that her mom had been to [facility] and would like that as her first choice, but is open to being faxed out. Pt is fully vaccinated with booster. Questions were answered, FL2 and faxout will be completed.  Expected Discharge Plan: Skilled Nursing Facility Barriers to Discharge: SNF Pending bed offer   Patient Goals and CMS Choice Patient states their goals for this hospitalization and ongoing recovery are:: Daughter is agreeable to SNF placement. CMS Medicare.gov Compare Post Acute Care list provided to:: Patient Represenative (must comment) [name], daughter)    Expected Discharge Plan and Services Expected Discharge Plan: [place]   Discharge Planning Services: CM Consult   Living arrangements for the past 2 months: Single Family Home                                      Prior Living Arrangements/Services Living arrangements for the past 2 months: Single Family Home Lives with:: Self Patient language and need for interpreter reviewed:: Yes        Need for Family Participation in Patient Care: Yes (Comment) Care giver support system in place?: Yes (comment) Current home services: Home RN,DME Criminal Activity/Legal Involvement Pertinent to Current Situation/Hospitalization: No - Comment as needed  Activities of Daily Living      Permission Sought/Granted Permission sought to share information with : Family Supports Permission granted to share information with : Yes, Verbal Permission Granted  Share Information with NAME: [name]     Permission granted to share info w  Relationship: Daughter  Permission granted to share info w Contact Information: [phone]  Emotional Assessment Appearance:: Appears older than stated age Attitude/Demeanor/Rapport: Unable to Assess Affect (typically observed): Unable to Assess Orientation: : Oriented to Self,Oriented to Place,Oriented to  Time,Oriented to Situation Alcohol / Substance Use: Not Applicable Psych Involvement: No (comment)  Admission diagnosis:  Toxic metabolic encephalopathy [H06.2] AMS (altered mental status) [R41.82] Patient Active Problem List   Diagnosis Date Noted  . Benign prostatic hyperplasia with urinary frequency 08/27/2020  . Myasthenia gravis ([place]) 08/27/2020  . AMS (altered mental status) 08/27/2020  . Toxic metabolic encephalopathy 37/62/8315  . Stroke-like symptom 05/12/2020  . Leukocytosis 05/12/2020  . Macrocytosis 05/12/2020  . Stroke-like symptoms 05/12/2020  . Closed fracture of right distal humerus 07/03/2019  . Carotid artery disease ([place]) 09/09/2018  . Pacemaker-St.Jude 07/03/2012  . Bruit 09/28/2011  . Tongue lesion 08/15/2011  . Essential hypertension 06/23/2010  . Mixed hyperlipidemia 09/22/2008  . Coronary artery disease involving native coronary artery of native heart without angina pectoris 09/22/2008  . Edema 09/22/2008  . SSS (sick sinus syndrome) ([place]) 09/22/2008   PCP:  Shirline Frees, MD Pharmacy:   [place], [place] - [place] [address] Phone: [phone] Fax: [phone]     Social Determinants of Health (SDOH) Interventions    Readmission Risk Interventions No flowsheet data found.

## 2020-08-29 NOTE — Progress Notes (Signed)
Subjective: Continues to improve  Exam: Vitals:   08/29/20 1533 08/29/20 1924  BP: (!) 159/80 (!) 159/78  Pulse: 72 89  Resp: 16 (!) 22  Temp: 97.9 F (36.6 C) 97.6 F (36.4 C)  SpO2: 97% 94%   Gen: In bed, NAD Resp: non-labored breathing, no acute distress Abd: soft, nt  Neuro: MS: awake, alert, oriented to monht, year, knows Monico Hoar is coming.  OI:NOMVE, EOMI,  Motor: MAEW Sensory:intact to LT  Pertinent Labs: Na 133  Impression:  86 year old male with disorganized thoughts, pressured speech in the setting of recently increasing prednisone.  This is by far and away most consistent with steroid-induced psychosis/mania.  This may take some time to gradually improve.  I do not see any current indication for plasma exchange given that he is not exhibiting any signs of crisis.   Recommendations: 1)Continue seroquel 25mg  QHS, could consider making this a as needed on discharge 2) continue prednisone and Mestinon at current doses  3) no further recommendations from my standpoint at this time, please call with any further questions or concerns  Roland Rack, MD [facility] [phone]  If 7pm- 7am, please page neurology on call as listed in [place].

## 2020-08-29 NOTE — NC FL2 (Signed)
[place] MEDICAID FL2 LEVEL OF CARE SCREENING TOOL     IDENTIFICATION  Patient Name: [name] Birthdate: 12-14-1930 Sex: male Admission Date (Current Location): 08/26/2020  [facility] and [place] Number:  Herbalist and Address:  [facility], [place] [address] [place], [place] [address]      Provider Number: [id number]  Attending Physician Name and Address:  Mckinley Jewel, MD  Relative Name and Phone Number:       Current Level of Care: Hospital Recommended Level of Care: [place] Prior Approval Number:    Date Approved/Denied:   PASRR Number: [id number]  Discharge Plan: SNF    Current Diagnoses: Patient Active Problem List   Diagnosis Date Noted  . Benign prostatic hyperplasia with urinary frequency 08/27/2020  . Myasthenia gravis ([place]) 08/27/2020  . AMS (altered mental status) 08/27/2020  . Toxic metabolic encephalopathy 46/56/8127  . Stroke-like symptom 05/12/2020  . Leukocytosis 05/12/2020  . Macrocytosis 05/12/2020  . Stroke-like symptoms 05/12/2020  . Closed fracture of right distal humerus 07/03/2019  . Carotid artery disease ([address]) 09/09/2018  . Pacemaker-St.Jude 07/03/2012  . Bruit 09/28/2011  . Tongue lesion 08/15/2011  . Essential hypertension 06/23/2010  . Mixed hyperlipidemia 09/22/2008  . Coronary artery disease involving native coronary artery of native heart without angina pectoris 09/22/2008  . Edema 09/22/2008  . SSS (sick sinus syndrome) ([place]) 09/22/2008    Orientation RESPIRATION BLADDER Height & Weight     Self,Time,Situation,Place  Normal Continent Weight: 154 lb 15.7 oz (70.3 kg) Height:  5\' 7"  (170.2 cm)  BEHAVIORAL SYMPTOMS/MOOD NEUROLOGICAL BOWEL NUTRITION STATUS      Continent Diet (See discharge summary)  AMBULATORY STATUS COMMUNICATION OF NEEDS Skin   Limited Assist Verbally Normal                       Personal Care Assistance Level of Assistance   Bathing,Feeding,Dressing Bathing Assistance: Limited assistance Feeding assistance: Independent Dressing Assistance: Limited assistance     Functional Limitations Info  Sight,Hearing,Speech Sight Info: Adequate Hearing Info: Adequate Speech Info: Adequate    SPECIAL CARE FACTORS FREQUENCY  PT (By licensed PT),OT (By licensed OT)     PT Frequency: 5x week OT Frequency: 5x week            Contractures Contractures Info: Not present    Additional Factors Info  Code Status,Allergies,Psychotropic Code Status Info: Full Allergies Info: Cefuroxime Axetil, Cyclosporine, Maxzide (triamterene-hctz), Sulfa Antibiotics, Sulfamethoxazole, Sulfamethoxazole-trimethoprim, Sulfur, Prednisone Psychotropic Info: seroquel         Current Medications (08/29/2020):  This is the current hospital active medication list Current Facility-Administered Medications  Medication Dose Route Frequency Provider Last Rate Last Admin  . acetaminophen (TYLENOL) tablet 650 mg  650 mg Oral Q6H PRN Shalhoub, Sherryll Burger, MD       Or  . acetaminophen (TYLENOL) suppository 650 mg  650 mg Rectal Q6H PRN Shalhoub, Sherryll Burger, MD      . amLODipine (NORVASC) tablet 5 mg  5 mg Oral Daily Shalhoub, Sherryll Burger, MD   5 mg at 08/29/20 0909  . aspirin chewable tablet 81 mg  81 mg Oral Daily Vernelle Emerald, MD   81 mg at 08/29/20 0909  . clopidogrel (PLAVIX) tablet 75 mg  75 mg Oral Daily Shalhoub, Sherryll Burger, MD   75 mg at 08/29/20 0909  . enoxaparin (LOVENOX) injection 40 mg  40 mg Subcutaneous Q24H Shalhoub, Sherryll Burger, MD   40  mg at 08/29/20 0651  . finasteride (PROSCAR) tablet 5 mg  5 mg Oral Daily Shalhoub, Sherryll Burger, MD   5 mg at 08/29/20 0909  . haloperidol lactate (HALDOL) injection 5 mg  5 mg Intravenous Q6H PRN Pahwani, Rinka R, MD   5 mg at 08/27/20 2032  . hydrALAZINE (APRESOLINE) injection 10 mg  10 mg Intravenous Q6H PRN Pahwani, Rinka R, MD      . nitroGLYCERIN (NITROSTAT) SL tablet 0.4 mg  0.4 mg Sublingual Q5  Min x 3 PRN Shalhoub, Sherryll Burger, MD      . ondansetron (ZOFRAN) tablet 4 mg  4 mg Oral Q6H PRN Shalhoub, Sherryll Burger, MD       Or  . ondansetron (ZOFRAN) injection 4 mg  4 mg Intravenous Q6H PRN Shalhoub, Sherryll Burger, MD      . pantoprazole (PROTONIX) EC tablet 40 mg  40 mg Oral QAC breakfast Vernelle Emerald, MD   40 mg at 08/29/20 0909  . polyethylene glycol (MIRALAX / GLYCOLAX) packet 17 g  17 g Oral Daily PRN Shalhoub, Sherryll Burger, MD      . predniSONE (DELTASONE) tablet 20 mg  20 mg Oral Q breakfast Shalhoub, Sherryll Burger, MD   20 mg at 08/29/20 0909  . pyridostigmine (MESTINON) tablet 60 mg  60 mg Oral 4 times per day Vernelle Emerald, MD   60 mg at 08/29/20 0908  . QUEtiapine (SEROQUEL) tablet 25 mg  25 mg Oral QHS Greta Doom, MD   25 mg at 08/28/20 2320  . spironolactone (ALDACTONE) tablet 12.5 mg  12.5 mg Oral Daily Shalhoub, Sherryll Burger, MD   12.5 mg at 08/29/20 0909     Discharge Medications: Please see discharge summary for a list of discharge medications.  Relevant Imaging Results:  Relevant Lab Results:   Additional Information SS# [place], [place]

## 2020-08-30 LAB — SARS CORONAVIRUS 2 BY RT PCR (HOSPITAL ORDER, PERFORMED IN ~~LOC~~ HOSPITAL LAB): SARS Coronavirus 2: NEGATIVE

## 2020-08-30 LAB — CBC
HCT: 37.1 % — ABNORMAL LOW (ref 39.0–52.0)
Hemoglobin: 12.9 g/dL — ABNORMAL LOW (ref 13.0–17.0)
MCH: 33 pg (ref 26.0–34.0)
MCHC: 34.8 g/dL (ref 30.0–36.0)
MCV: 94.9 fL (ref 80.0–100.0)
Platelets: 134 10*3/uL — ABNORMAL LOW (ref 150–400)
RBC: 3.91 MIL/uL — ABNORMAL LOW (ref 4.22–5.81)
RDW: 15.9 % — ABNORMAL HIGH (ref 11.5–15.5)
WBC: 4.8 10*3/uL (ref 4.0–10.5)
nRBC: 0 % (ref 0.0–0.2)

## 2020-08-30 LAB — BASIC METABOLIC PANEL
Anion gap: 8 (ref 5–15)
BUN: 18 mg/dL (ref 8–23)
CO2: 24 mmol/L (ref 22–32)
Calcium: 8.5 mg/dL — ABNORMAL LOW (ref 8.9–10.3)
Chloride: 102 mmol/L (ref 98–111)
Creatinine, Ser: 0.9 mg/dL (ref 0.61–1.24)
GFR, Estimated: >60 mL/min (ref >60)
Glucose, Bld: 95 mg/dL (ref 70–99)
Potassium: 3.3 mmol/L — ABNORMAL LOW (ref 3.5–5.1)
Sodium: 134 mmol/L — ABNORMAL LOW (ref 135–145)

## 2020-08-30 LAB — MAGNESIUM: Magnesium: 2.3 mg/dL (ref 1.7–2.4)

## 2020-08-30 MED ORDER — POTASSIUM CHLORIDE 20 MEQ PO PACK
40.0000 meq | PACK | Freq: Once | ORAL | Status: AC
  Administered 2020-08-30: 40 meq via ORAL
  Filled 2020-08-30: qty 2

## 2020-08-30 NOTE — Plan of Care (Signed)
  Problem: Education: Goal: Knowledge of General Education information will improve Description: Including pain rating scale, medication(s)/side effects and non-pharmacologic comfort measures Outcome: Progressing   Problem: Clinical Measurements: Goal: Respiratory complications will improve Outcome: Progressing Goal: Cardiovascular complication will be avoided Outcome: Progressing   Problem: Pain Managment: Goal: General experience of comfort will improve Outcome: Progressing   Problem: Safety: Goal: Ability to remain free from injury will improve Outcome: Progressing   Problem: Skin Integrity: Goal: Risk for impaired skin integrity will decrease Outcome: Progressing

## 2020-08-30 NOTE — TOC Progression Note (Signed)
Transition of Care [facility]) - Progression Note    Patient Details  Name: [name] MRN: [id number] Date of Birth: January 01, 1931  Transition of Care [facility]) CM/SW [place], [place] Phone Number: 08/30/2020, 2:45 PM  Clinical Narrative:     CSW spoke with pt and daughter at bedside, [facility] for [facility]. [name] has been updated and is aware, they noted that they can take him tomorrow morning. Covid was ordered. SW will follow for DC tomorrow.  Expected Discharge Plan: Skilled Nursing Facility Barriers to Discharge: SNF Pending bed offer  Expected Discharge Plan and Services Expected Discharge Plan: [place]   Discharge Planning Services: CM Consult   Living arrangements for the past 2 months: Single Family Home                                       Social Determinants of Health (SDOH) Interventions    Readmission Risk Interventions No flowsheet data found.

## 2020-08-30 NOTE — Progress Notes (Signed)
PROGRESS NOTE    [name]  XNT:[id number] DOB: 1930-10-21 DOA: 08/26/2020 PCP: Shirline Frees, MD   Brief Narrative:  Patient is a 86 year old male with past medical history of coronary artery disease status post PCI in 2009, hypertension, sick sinus syndrome status post pacemaker placement, hyperlipidemia, solitary kidney and recent diagnosis of myasthenia gravis in August 2021 presents to emergency department with progressively worsening confusion and erratic behavior.  Upon arrival to ED: Patient tachycardic, tachypneic, blood pressure labile, CT head no evidence of acute intracranial abnormality.  Chest x-ray negative for acute findings.  Neurology consulted.  Patient admitted for further evaluation and management of his altered mental status.  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  CT head is negative for acute findings.  Chest x-ray negative, COVID-19 negative, ammonia level B12, folate, CK, CRP, TSH, lactic acid, UA all came back negative. -Neurology consulted-recommended to continue Mestinon 60 mg at 7 AM, 10 AM, 1 PM, 5 PM, continue prednisone 20 mg daily -Cont. Seroquel 25 mg nightly and as needed on discharge as per neurology recommendations. Neurology signed off. Appreciate help. -No indication of plasmapheresis at this time-as patient is not exhibiting any signs of crisis. -consult PT/OT-recommend SNF   Myasthenia gravis: -Recently diagnosed in August 2021.  Being treated with pyridostigmine as well as IVIG infusions.  Recently initiated on prednisone several weeks ago due to complaints of dysphagia and choking. -Continue prednisone and Mestinon as above as per neurology recommendations -Appreciate neurology help  Coronary artery disease status post PCI: Patient denies ACS symptoms. -Continue aspirin, Plavix, nitro as needed  BPH: Continue Proscar  Essential hypertension: Blood pressure is  stable this morning. -continue amlodipine and Aldactone.   -Continue hydralazine 10 mg every 6 hours as needed IV for blood pressure more than 160/100. -Monitor blood pressure closely  GERD: Continue PPI  Symptomatic sick sinus syndrome status post pacemaker placement: Continue to monitor on telemetry.  Carotid artery stenosis: Continue aspirin and Plavix  Mild hyponatremia: Resolved  History of dysphagia: -Evaluated by SLP recommend regular diet and thin liquid.  Mild aspiration risk.  Thrombocytopenia: Platelet: 136-134 this morning.  No signs of active bleeding.  Repeat CBC tomorrow AM  Hypokalemia: Potassium 3.3 this morning. Replenished. Magnesium level: WNL. Repeat BMP tomorrow a.m.Marland Kitchen  Disposition: Patient is clinically improving overall.  Case manager working on patient's SNF bed placement-likely discharge tomorrow AM.  DVT prophylaxis: Lovenox Code Status: Full code.  Family Communication: Patient's daughter present at bedside.  Plan of care discussed with patient in length and he verbalized understanding and agreed with it.  Disposition Plan: SNF  Consultants:   Neurology  Procedures:   CT head  Antimicrobials:   None  Status is: Observation  Dispo: The patient is from: Home              Anticipated d/c is to: SNF              Anticipated d/c date is: 1 day              Patient currently is not medically stable to d/c.    Subjective: Patient seen and examined. Sitting comfortably on the bed. Very pleasant to talk. No new complaints. No acute events overnight.  Objective: Vitals:   08/29/20 2339 08/30/20 0405 08/30/20 0732 08/30/20 1126  BP: (!) 169/61 (!) 147/74 (!) 144/70 (!) 151/86  Pulse: (!) 59 75 69 88  Resp: 16  20 20   Temp: 97.9  F (36.6 C) 98 F (36.7 C) 97.9 F (36.6 C) 98.6 F (37 C)  TempSrc: Oral Oral Oral Oral  SpO2: 96% 96% 97% 92%  Weight:      Height:        Intake/Output Summary (Last 24 hours) at 08/30/2020 1409 Last  data filed at 08/30/2020 1105 Gross per 24 hour  Intake 840 ml  Output 300 ml  Net 540 ml   Filed Weights   08/26/20 1317 08/27/20 0432  Weight: 70.3 kg 70.3 kg    Examination: General exam: Appears calm and comfortable, elderly, on room air, communicating well, very pleasant to talk Respiratory system: Clear to auscultation. Respiratory effort normal. Cardiovascular system: S1 & S2 heard, RRR. No JVD, murmurs, rubs, gallops or clicks. No pedal edema. Gastrointestinal system: Abdomen is nondistended, soft and nontender. No organomegaly or masses felt. Normal bowel sounds heard. Central nervous system: Alert and oriented. No focal neurological deficits. Extremities: Symmetric 5 x 5 power. Skin: No rashes, lesions or ulcers. Psychiatry: Judgement and insight appear normal. Mood & affect appropriate.  Data Reviewed: I have personally reviewed following labs and imaging studies  CBC: Recent Labs  Lab 08/26/20 1330 08/26/20 2208 08/27/20 0340 08/28/20 0026 08/29/20 0145 08/30/20 0422  WBC 8.5  --  7.9 6.6 5.0 4.8  NEUTROABS  --   --  6.2  --   --   --   HGB 13.5 14.3 12.3* 12.8* 13.0 12.9*  HCT 40.8 42.0 37.5* 38.8* 38.0* 37.1*  MCV 99.0  --  99.7 96.3 96.2 94.9  PLT 178  --  150 154 136* 701*   Basic Metabolic Panel: Recent Labs  Lab 08/26/20 1330 08/26/20 2208 08/27/20 0340 08/28/20 0026 08/29/20 0145 08/30/20 0422  NA 132* 136 134* 133* 137 134*  K 4.3 4.3 3.6 3.7 3.9 3.3*  CL 97*  --  100 102 103 102  CO2 27  --  26 23 26 24   GLUCOSE 117*  --  124* 96 84 95  BUN 42*  --  31* 28* 18 18  CREATININE 1.09  --  1.07 1.03 1.00 0.90  CALCIUM 8.8*  --  8.2* 8.5* 8.6* 8.5*  MG  --   --  2.1  --   --  2.3   GFR: Estimated Creatinine Clearance: 54.1 mL/min (by C-G formula based on SCr of 0.9 mg/dL). Liver Function Tests: Recent Labs  Lab 08/26/20 1330 08/27/20 0340 08/28/20 0026  AST 25 25 29   ALT 35 29 33  ALKPHOS 97 74 76  BILITOT 0.5 0.6 0.7  PROT 8.9* 7.3  7.6  ALBUMIN 3.0* 2.6* 2.9*   No results for input(s): LIPASE, AMYLASE in the last 168 hours. Recent Labs  Lab 08/26/20 2148  AMMONIA 20   Coagulation Profile: No results for input(s): INR, PROTIME in the last 168 hours. Cardiac Enzymes: Recent Labs  Lab 08/27/20 0340  CKTOTAL 35*   BNP (last 3 results) No results for input(s): PROBNP in the last 8760 hours. HbA1C: No results for input(s): HGBA1C in the last 72 hours. CBG: Recent Labs  Lab 08/26/20 1957  GLUCAP 110*   Lipid Profile: No results for input(s): CHOL, HDL, LDLCALC, TRIG, CHOLHDL, LDLDIRECT in the last 72 hours. Thyroid Function Tests: No results for input(s): TSH, T4TOTAL, FREET4, T3FREE, THYROIDAB in the last 72 hours. Anemia Panel: No results for input(s): VITAMINB12, FOLATE, FERRITIN, TIBC, IRON, RETICCTPCT in the last 72 hours. Sepsis Labs: Recent Labs  Lab 08/27/20 0313  LATICACIDVEN 1.7  Recent Results (from the past 240 hour(s))  Urine culture     Status: Abnormal   Collection Time: 08/26/20  8:55 PM   Specimen: Urine, Random  Result Value Ref Range Status   Specimen Description URINE, RANDOM  Final   Special Requests NONE  Final   Culture (A)  Final    <10,000 COLONIES/mL INSIGNIFICANT GROWTH Performed at [facility], [address], [place], [place] [address]    Report Status 08/28/2020 FINAL  Final  Resp Panel by RT-PCR (Flu A&B, Covid) Nasopharyngeal Swab     Status: None   Collection Time: 08/27/20  8:27 AM   Specimen: Nasopharyngeal Swab; Nasopharyngeal(NP) swabs in vial transport medium  Result Value Ref Range Status   SARS Coronavirus 2 by RT PCR NEGATIVE NEGATIVE Final    Comment: (NOTE) SARS-CoV-2 target nucleic acids are NOT DETECTED.  The SARS-CoV-2 RNA is generally detectable in upper respiratory specimens during the acute phase of infection. The lowest concentration of SARS-CoV-2 viral copies this assay can detect is 138 copies/mL. A negative result does not  preclude SARS-Cov-2 infection and should not be used as the sole basis for treatment or other patient management decisions. A negative result may occur with  improper specimen collection/handling, submission of specimen other than nasopharyngeal swab, presence of viral mutation(s) within the areas targeted by this assay, and inadequate number of viral copies(<138 copies/mL). A negative result must be combined with clinical observations, patient history, and epidemiological information. The expected result is Negative.  Fact Sheet for Patients:  EntrepreneurPulse.com.au  Fact Sheet for Healthcare Providers:  IncredibleEmployment.be  This test is no t yet approved or cleared by the [place] FDA and  has been authorized for detection and/or diagnosis of SARS-CoV-2 by FDA under an Emergency Use Authorization (EUA). This EUA will remain  in effect (meaning this test can be used) for the duration of the COVID-19 declaration under Section 564(b)(1) of the Act, 21 U.S.C.section 360bbb-3(b)(1), unless the authorization is terminated  or revoked sooner.       Influenza A by PCR NEGATIVE NEGATIVE Final   Influenza B by PCR NEGATIVE NEGATIVE Final    Comment: (NOTE) The Xpert Xpress SARS-CoV-2/FLU/RSV plus assay is intended as an aid in the diagnosis of influenza from Nasopharyngeal swab specimens and should not be used as a sole basis for treatment. Nasal washings and aspirates are unacceptable for Xpert Xpress SARS-CoV-2/FLU/RSV testing.  Fact Sheet for Patients: EntrepreneurPulse.com.au  Fact Sheet for Healthcare Providers: IncredibleEmployment.be  This test is not yet approved or cleared by the [place] FDA and has been authorized for detection and/or diagnosis of SARS-CoV-2 by FDA under an Emergency Use Authorization (EUA). This EUA will remain in effect (meaning this test can be used) for the  duration of the COVID-19 declaration under Section 564(b)(1) of the Act, 21 U.S.C. section 360bbb-3(b)(1), unless the authorization is terminated or revoked.  Performed at [facility], [place] [address] [place], [place] [address]   SARS Coronavirus 2 by RT PCR (hospital order, performed in [facility] hospital lab) Nasopharyngeal Nasopharyngeal Swab     Status: None   Collection Time: 08/30/20 11:42 AM   Specimen: Nasopharyngeal Swab  Result Value Ref Range Status   SARS Coronavirus 2 NEGATIVE NEGATIVE Final    Comment: (NOTE) SARS-CoV-2 target nucleic acids are NOT DETECTED.  The SARS-CoV-2 RNA is generally detectable in upper and lower respiratory specimens during the acute phase of infection. The lowest concentration of SARS-CoV-2 viral copies  this assay can detect is 250 copies / mL. A negative result does not preclude SARS-CoV-2 infection and should not be used as the sole basis for treatment or other patient management decisions.  A negative result may occur with improper specimen collection / handling, submission of specimen other than nasopharyngeal swab, presence of viral mutation(s) within the areas targeted by this assay, and inadequate number of viral copies (<250 copies / mL). A negative result must be combined with clinical observations, patient history, and epidemiological information.  Fact Sheet for Patients:   StrictlyIdeas.no  Fact Sheet for Healthcare Providers: BankingDealers.co.za  This test is not yet approved or  cleared by the [place] FDA and has been authorized for detection and/or diagnosis of SARS-CoV-2 by FDA under an Emergency Use Authorization (EUA).  This EUA will remain in effect (meaning this test can be used) for the duration of the COVID-19 declaration under Section 564(b)(1) of the Act, 21 U.S.C. section 360bbb-3(b)(1), unless the authorization is terminated or revoked  sooner.  Performed at [facility], [place] [address] [place], [place] [address]       Radiology Studies: No results found.  Scheduled Meds: . amLODipine  5 mg Oral Daily  . aspirin  81 mg Oral Daily  . clopidogrel  75 mg Oral Daily  . enoxaparin (LOVENOX) injection  40 mg Subcutaneous Q24H  . finasteride  5 mg Oral Daily  . pantoprazole  40 mg Oral QAC breakfast  . predniSONE  20 mg Oral Q breakfast  . pyridostigmine  60 mg Oral 4 times per day  . QUEtiapine  25 mg Oral QHS  . spironolactone  12.5 mg Oral Daily   Continuous Infusions:    LOS: 3 days   Time spent: 45 minutes   Leovardo Thoman Loann Quill, MD [facility]  If 7PM-7AM, please contact night-coverage [email or web address] 08/30/2020, 2:09 PM

## 2020-08-30 NOTE — Discharge Planning (Signed)
Rosaland Lao, SW with [facility] called for update on pt.  RNCM advised to call Bedside SW for more accurate information.

## 2020-08-31 ENCOUNTER — Ambulatory Visit: Payer: Medicare Other | Admitting: Internal Medicine

## 2020-08-31 DIAGNOSIS — E782 Mixed hyperlipidemia: Secondary | ICD-10-CM | POA: Diagnosis not present

## 2020-08-31 DIAGNOSIS — R1312 Dysphagia, oropharyngeal phase: Secondary | ICD-10-CM | POA: Diagnosis not present

## 2020-08-31 DIAGNOSIS — I10 Essential (primary) hypertension: Secondary | ICD-10-CM | POA: Diagnosis not present

## 2020-08-31 DIAGNOSIS — R451 Restlessness and agitation: Secondary | ICD-10-CM | POA: Diagnosis not present

## 2020-08-31 DIAGNOSIS — R2681 Unsteadiness on feet: Secondary | ICD-10-CM | POA: Diagnosis not present

## 2020-08-31 DIAGNOSIS — N401 Enlarged prostate with lower urinary tract symptoms: Secondary | ICD-10-CM | POA: Diagnosis not present

## 2020-08-31 DIAGNOSIS — M6281 Muscle weakness (generalized): Secondary | ICD-10-CM | POA: Diagnosis not present

## 2020-08-31 DIAGNOSIS — R41841 Cognitive communication deficit: Secondary | ICD-10-CM | POA: Diagnosis not present

## 2020-08-31 DIAGNOSIS — D696 Thrombocytopenia, unspecified: Secondary | ICD-10-CM | POA: Diagnosis not present

## 2020-08-31 DIAGNOSIS — G928 Other toxic encephalopathy: Secondary | ICD-10-CM | POA: Diagnosis not present

## 2020-08-31 DIAGNOSIS — M2550 Pain in unspecified joint: Secondary | ICD-10-CM | POA: Diagnosis not present

## 2020-08-31 DIAGNOSIS — K219 Gastro-esophageal reflux disease without esophagitis: Secondary | ICD-10-CM | POA: Diagnosis not present

## 2020-08-31 DIAGNOSIS — R600 Localized edema: Secondary | ICD-10-CM | POA: Diagnosis not present

## 2020-08-31 DIAGNOSIS — R2689 Other abnormalities of gait and mobility: Secondary | ICD-10-CM | POA: Diagnosis not present

## 2020-08-31 DIAGNOSIS — I495 Sick sinus syndrome: Secondary | ICD-10-CM | POA: Diagnosis not present

## 2020-08-31 DIAGNOSIS — G9341 Metabolic encephalopathy: Secondary | ICD-10-CM | POA: Diagnosis not present

## 2020-08-31 DIAGNOSIS — R4182 Altered mental status, unspecified: Secondary | ICD-10-CM | POA: Diagnosis not present

## 2020-08-31 DIAGNOSIS — Z950 Presence of cardiac pacemaker: Secondary | ICD-10-CM | POA: Diagnosis not present

## 2020-08-31 DIAGNOSIS — F0280 Dementia in other diseases classified elsewhere without behavioral disturbance: Secondary | ICD-10-CM | POA: Diagnosis not present

## 2020-08-31 DIAGNOSIS — G7000 Myasthenia gravis without (acute) exacerbation: Secondary | ICD-10-CM | POA: Diagnosis not present

## 2020-08-31 DIAGNOSIS — Z7401 Bed confinement status: Secondary | ICD-10-CM | POA: Diagnosis not present

## 2020-08-31 DIAGNOSIS — Z9181 History of falling: Secondary | ICD-10-CM | POA: Diagnosis not present

## 2020-08-31 LAB — BASIC METABOLIC PANEL
Anion gap: 7 (ref 5–15)
BUN: 19 mg/dL (ref 8–23)
CO2: 28 mmol/L (ref 22–32)
Calcium: 8.9 mg/dL (ref 8.9–10.3)
Chloride: 100 mmol/L (ref 98–111)
Creatinine, Ser: 1.09 mg/dL (ref 0.61–1.24)
GFR, Estimated: >60 mL/min (ref >60)
Glucose, Bld: 91 mg/dL (ref 70–99)
Potassium: 3.8 mmol/L (ref 3.5–5.1)
Sodium: 135 mmol/L (ref 135–145)

## 2020-08-31 LAB — CBC
HCT: 39.3 % (ref 39.0–52.0)
Hemoglobin: 12.9 g/dL — ABNORMAL LOW (ref 13.0–17.0)
MCH: 31.8 pg (ref 26.0–34.0)
MCHC: 32.8 g/dL (ref 30.0–36.0)
MCV: 96.8 fL (ref 80.0–100.0)
Platelets: 142 10*3/uL — ABNORMAL LOW (ref 150–400)
RBC: 4.06 MIL/uL — ABNORMAL LOW (ref 4.22–5.81)
RDW: 15.6 % — ABNORMAL HIGH (ref 11.5–15.5)
WBC: 4.7 10*3/uL (ref 4.0–10.5)
nRBC: 0 % (ref 0.0–0.2)

## 2020-08-31 LAB — MAGNESIUM: Magnesium: 2.2 mg/dL (ref 1.7–2.4)

## 2020-08-31 MED ORDER — QUETIAPINE FUMARATE 25 MG PO TABS: 25.0000 mg | ORAL_TABLET | Freq: Every evening | ORAL | 0 refills | Status: DC | PRN

## 2020-08-31 NOTE — TOC Transition Note (Signed)
Transition of Care [facility]) - CM/SW Discharge Note   Patient Details  Name: [name] MRN: [id number] Date of Birth: 1931-09-08  Transition of Care [facility]) CM/SW Contact:  Roberto Chars, LCSW Phone Number: 08/31/2020, 12:24 PM   Clinical Narrative:   Pt discharging to [facility], room 1007.  RN call report to [phone].    Final next level of care: Skilled Nursing Facility Barriers to Discharge: Barriers Resolved   Patient Goals and CMS Choice Patient states their goals for this hospitalization and ongoing recovery are:: Daughter is agreeable to SNF placement. CMS Medicare.gov Compare Post Acute Care list provided to:: Patient Represenative (must comment) [name], daughter)    Discharge Placement              Patient chooses bed at: [facility] Patient to be transferred to facility by: [facility] Name of family member notified: [name], daughter Patient and family notified of of transfer: 08/31/20  Discharge Plan and Services   Discharge Planning Services: CM Consult                                 Social Determinants of Health ([place]) Interventions     Readmission Risk Interventions No flowsheet data found.

## 2020-08-31 NOTE — Discharge Summary (Signed)
Physician Discharge Summary  [name] HFW:[id number] DOB: Nov 25, 1930 DOA: 08/26/2020  PCP: Shirline Frees, MD  Admit date: 08/26/2020 Discharge date: 08/31/2020  Admitted From: Home   Disposition:  SNF  Recommendations for Outpatient Follow-up:  1. Follow-up with PCP in 1 week 2. Follow-up with neurology 3. Continue prednisone and Mestinon at current doses 4. Take Seroquel 25 mg nightly as needed 5. Follow-up with neuropsychology at [facility] neurology  [place]: None Equipment/Devices: None Discharge Condition: Stable CODE STATUS: Full code Diet recommendation: Low-sodium diet  Brief/Interim Summary:  Patient is a 86 year old male with past medical history of coronary artery disease status post PCI in 2009, hypertension, sick sinus syndrome status post pacemaker placement, hyperlipidemia, solitary kidney and recent diagnosis of myasthenia gravis in August 2021 presents to emergency department with progressively worsening confusion and erratic behavior.  Upon arrival to ED: Patient tachycardic, tachypneic, blood pressure labile, CT head no evidence of acute intracranial abnormality.  Chest x-ray negative for acute findings.  Neurology consulted.  Patient admitted for further evaluation and management of his altered mental status.  Acute metabolic encephalopathy:  -Likely steroid-induced psychosis.   -CT head was negative for acute findings.  Chest x-ray negative, COVID-19 negative, ammonia level B12, folate, CK, CRP, TSH, lactic acid, UA all came back negative. -Neurology consulted-recommended to continue Mestinon 60 mg at 7 AM, 10 AM, 1 PM, 5 PM, continue prednisone 20 mg daily and Seroquel 25 mg nightly as needed -Started on Seroquel 25 mg nightly -No indication of plasmapheresis at this time-as patient is not exhibiting any signs of crisis.  Neurology signed off.  Appreciate assistance. -On aspiration/seizure/fall precautions -consulted PT/OT-recommend SNF   Myasthenia  gravis: -Recently diagnosed in August 2021.  Being treated with pyridostigmine as well as IVIG infusions.  Recently initiated on prednisone several weeks ago due to complaints of dysphagia and choking. -Continue prednisone and Mestinon as above as per neurology recommendations -Appreciate neurology help -Evaluated by SLP recommended regular diet, thin liquids.  Coronary artery disease status post PCI: Patient denied ACS symptoms. -Continued aspirin, Plavix, nitro as needed  BPH: Continued Proscar  Essential hypertension:  Remained stable on the day of discharge. -continued amlodipine and Aldactone.   - hydralazine 10 mg every 6 hours as needed IV for blood pressure more than 160/100.   -Monitor blood pressure closely  GERD: Continued PPI  Symptomatic sick sinus syndrome status post pacemaker placement: Continued to monitor on telemetry.  Carotid artery stenosis: Continued aspirin and Plavix  Mild hyponatremia: Resolved  History of dysphagia: -Evaluated by SLP recommend regular diet and thin liquid.  Mild aspiration risk.  Thrombocytopenia:  Platelet count improved.  From 1 36-1 42..  No signs of active bleeding.    Patient's daughter at the bedside-she is concerned about patient's behavior as he is very obsessed with his physical therapist and caregiver at home-she was wondering if he can be seen by neuropsychologist at [facility] neurology Donata Duff after discharge from the SNF.  Discharge Diagnoses:   Acute metabolic encephalopathy in the setting of steroid induced psychosis Myasthenia gravis Coronary artery disease status post PCI BPH Hypertension GERD Symptomatic sick sinus syndrome status post pacemaker placement Carotid artery stenosis Mild hyponatremia History of dysphagia Thrombocytopenia   Discharge Instructions  Discharge Instructions    Discharge instructions   Complete by: As directed    Follow-up with PCP after discharge from SNF Follow-up  with neurology Continue prednisone and Mestinon  Seroquel 25 mg nightly as needed   Increase activity slowly  Complete by: As directed      Allergies as of 08/31/2020      Reactions   Cefuroxime Axetil Other (See Comments)   Reaction not recalled   Cyclosporine Other (See Comments)   Reaction not recalled   Maxzide [triamterene-hctz] Other (See Comments)   Adverse reaction- bloating and abdominal pain   Sulfa Antibiotics Other (See Comments)   Could not sleep/eat and experienced gastric pain   Sulfamethoxazole Other (See Comments)   Could not sleep/eat and had gastric pain   Sulfamethoxazole-trimethoprim Other (See Comments)   Gastric pain and could not eat/sleep   Sulfur Other (See Comments)   No energy, could not walk    Prednisone Other (See Comments)   Can tolerate for a short period of time      Medication List    TAKE these medications   amLODipine 5 MG tablet Commonly known as: NORVASC Take 1 tablet by mouth once daily   aspirin 81 MG tablet Take 81 mg by mouth daily.   clopidogrel 75 MG tablet Commonly known as: PLAVIX Take 1 tablet by mouth once daily   finasteride 5 MG tablet Commonly known as: PROSCAR Take 5 mg by mouth daily.   Gammagard 5 GM/50ML Soln Generic drug: Immune Globulin (Human) every 21 ( twenty-one) days.   Nitrostat 0.4 MG SL tablet Generic drug: nitroGLYCERIN DISSOLVE ONE TABLET UNDER THE TONGUE EVERY 5 MINUTES AS NEEDED FOR CHEST PAIN.  DO NOT EXCEED A TOTAL OF 3 DOSES IN 15 MINUTES What changed: See the new instructions.   pantoprazole 40 MG tablet Commonly known as: PROTONIX Take 1 tablet (40 mg total) by mouth daily. What changed: when to take this   predniSONE 20 MG tablet Commonly known as: DELTASONE Take 1 tablet (20 mg total) by mouth daily with breakfast.   pyridostigmine 60 MG tablet Commonly known as: MESTINON Take 1 tablet at 7am, 10am 1pm, and 5pm What changed:   how much to take  how to take this  when  to take this  additional instructions   QUEtiapine 25 MG tablet Commonly known as: SEROQUEL Take 1 tablet (25 mg total) by mouth at bedtime as needed.   spironolactone 25 MG tablet Commonly known as: ALDACTONE Take 0.5 tablets (12.5 mg total) by mouth daily.       Follow-up Information    Shirline Frees, MD Follow up in 1 week(s).   Specialty: Family Medicine Contact information: [place] [address] [phone]        Fay Records, MD .   Specialty: Cardiology Contact information: [address] [place] [address] [phone]        Alphonzo Severance Follow up in 2 week(s).   Specialty: Psychology Contact information: [place] [place] [address] [phone]              Allergies  Allergen Reactions  . Cefuroxime Axetil Other (See Comments)    Reaction not recalled  . Cyclosporine Other (See Comments)    Reaction not recalled  . Maxzide [Triamterene-Hctz] Other (See Comments)    Adverse reaction- bloating and abdominal pain  . Sulfa Antibiotics Other (See Comments)    Could not sleep/eat and experienced gastric pain  . Sulfamethoxazole Other (See Comments)    Could not sleep/eat and had gastric pain  . Sulfamethoxazole-Trimethoprim Other (See Comments)    Gastric pain and could not eat/sleep  . Sulfur Other (See Comments)  No energy, could not walk   . Prednisone Other (See Comments)    Can tolerate for a short period of time    Consultations:  Neurology   Procedures/Studies: CT Head Wo Contrast  Result Date: 08/26/2020 CLINICAL DATA:  Mental status change increased confusion EXAM: CT HEAD WITHOUT CONTRAST TECHNIQUE: Contiguous axial images were obtained from the base of the skull through the vertex without intravenous contrast. COMPARISON:  05/12/2020 FINDINGS: Brain: No acute territorial infarction, hemorrhage, or intracranial mass. Mild atrophy. Mild  hypodensity in the white matter likely chronic small vessel ischemic change. Stable ventricle size Vascular: No hyperdense vessels. Vertebral and carotid vascular calcification Skull: Normal. Negative for fracture or focal lesion. Sinuses/Orbits: No acute finding. Other: None IMPRESSION: 1. No CT evidence for acute intracranial abnormality. 2. Atrophy and mild chronic small vessel ischemic changes of the white matter. Electronically Signed   By: Donavan Foil M.D.   On: 08/26/2020 21:13   DG Chest Portable 1 View  Result Date: 08/26/2020 CLINICAL DATA:  Altered mental status. EXAM: PORTABLE CHEST 1 VIEW COMPARISON:  Chest x-ray 05/12/2020. FINDINGS: Oval-like 3.1 by 1 cm metallic density overlying the left neck likely external to the patient. Left chest wall 2 lead pacemaker in grossly similar position. The heart size and mediastinal contours are unchanged. Aortic arch and descending thoracic aorta atherosclerotic plaque. Biapical pleural/pulmonary scarring. Similar appear coarsened interstitial markings. Suggestion of hazy airspace opacity overlying the left mid to lower lung zone reticulations with silhouetting off of the left heart border. No pulmonary edema. No pleural effusion. No pneumothorax. No acute osseous abnormality. IMPRESSION: 1. Left mid to lower lung zone hazy airspace opacity could represent infection/inflammation. Followup PA and lateral chest X-ray is recommended in 3-4 weeks following therapy to ensure resolution and exclude underlying malignancy. 2. Oval-like 3.1 by 1 cm metallic density overlying the left neck likely external to the patient. Please correlate with physical exam. Electronically Signed   By: Iven Finn M.D.   On: 08/26/2020 23:02     Subjective: Patient seen and examined.  Daughter at bedside.  Patient tells me that he is doing fine and has no new complaints.  No acute events overnight.  Remained afebrile.  He appears pleasant to me and answers all questions  appropriately however as per daughter he is still delirious and saying that he would like to take all the stuff for the dinner who takes care of him in the hospital.  Discharge Exam: Vitals:   08/31/20 0323 08/31/20 0813  BP: 132/65 (!) 124/53  Pulse: 65 65  Resp: 18 20  Temp: 97.7 F (36.5 C) 97.7 F (36.5 C)  SpO2: 97% 100%   Vitals:   08/30/20 2051 08/30/20 2300 08/31/20 0323 08/31/20 0813  BP: (!) 172/89 (!) 176/81 132/65 (!) 124/53  Pulse: 69 62 65 65  Resp: 18 18 18 20   Temp: 98 F (36.7 C) 98 F (36.7 C) 97.7 F (36.5 C) 97.7 F (36.5 C)  TempSrc: Oral Oral Oral Oral  SpO2: 95% 98% 97% 100%  Weight:      Height:        General: Pt is alert, awake, not in acute distress, elderly, on room air, communicating well, appears pleasant Cardiovascular: RRR, S1/S2 +, no rubs, no gallops Respiratory: CTA bilaterally, no wheezing, no rhonchi Abdominal: Soft, NT, ND, bowel sounds + Extremities: no edema, no cyanosis    The results of significant diagnostics from this hospitalization (including imaging, microbiology, ancillary and laboratory) are listed below  for reference.     Microbiology: Recent Results (from the past 240 hour(s))  Urine culture     Status: Abnormal   Collection Time: 08/26/20  8:55 PM   Specimen: Urine, Random  Result Value Ref Range Status   Specimen Description URINE, RANDOM  Final   Special Requests NONE  Final   Culture (A)  Final    <10,000 COLONIES/mL INSIGNIFICANT GROWTH Performed at [facility], [address], [place], [place] [address]    Report Status 08/28/2020 FINAL  Final  Resp Panel by RT-PCR (Flu A&B, Covid) Nasopharyngeal Swab     Status: None   Collection Time: 08/27/20  8:27 AM   Specimen: Nasopharyngeal Swab; Nasopharyngeal(NP) swabs in vial transport medium  Result Value Ref Range Status   SARS Coronavirus 2 by RT PCR NEGATIVE NEGATIVE Final    Comment: (NOTE) SARS-CoV-2 target nucleic acids are NOT DETECTED.  The  SARS-CoV-2 RNA is generally detectable in upper respiratory specimens during the acute phase of infection. The lowest concentration of SARS-CoV-2 viral copies this assay can detect is 138 copies/mL. A negative result does not preclude SARS-Cov-2 infection and should not be used as the sole basis for treatment or other patient management decisions. A negative result may occur with  improper specimen collection/handling, submission of specimen other than nasopharyngeal swab, presence of viral mutation(s) within the areas targeted by this assay, and inadequate number of viral copies(<138 copies/mL). A negative result must be combined with clinical observations, patient history, and epidemiological information. The expected result is Negative.  Fact Sheet for Patients:  EntrepreneurPulse.com.au  Fact Sheet for Healthcare Providers:  IncredibleEmployment.be  This test is no t yet approved or cleared by the [place] FDA and  has been authorized for detection and/or diagnosis of SARS-CoV-2 by FDA under an Emergency Use Authorization (EUA). This EUA will remain  in effect (meaning this test can be used) for the duration of the COVID-19 declaration under Section 564(b)(1) of the Act, 21 U.S.C.section 360bbb-3(b)(1), unless the authorization is terminated  or revoked sooner.       Influenza A by PCR NEGATIVE NEGATIVE Final   Influenza B by PCR NEGATIVE NEGATIVE Final    Comment: (NOTE) The Xpert Xpress SARS-CoV-2/FLU/RSV plus assay is intended as an aid in the diagnosis of influenza from Nasopharyngeal swab specimens and should not be used as a sole basis for treatment. Nasal washings and aspirates are unacceptable for Xpert Xpress SARS-CoV-2/FLU/RSV testing.  Fact Sheet for Patients: EntrepreneurPulse.com.au  Fact Sheet for Healthcare Providers: IncredibleEmployment.be  This test is not yet approved or  cleared by the [place] FDA and has been authorized for detection and/or diagnosis of SARS-CoV-2 by FDA under an Emergency Use Authorization (EUA). This EUA will remain in effect (meaning this test can be used) for the duration of the COVID-19 declaration under Section 564(b)(1) of the Act, 21 U.S.C. section 360bbb-3(b)(1), unless the authorization is terminated or revoked.  Performed at [facility], [place] [address], [place],  [address]   SARS Coronavirus 2 by RT PCR (hospital order, performed in [facility] hospital lab) Nasopharyngeal Nasopharyngeal Swab     Status: None   Collection Time: 08/30/20 11:42 AM   Specimen: Nasopharyngeal Swab  Result Value Ref Range Status   SARS Coronavirus 2 NEGATIVE NEGATIVE Final    Comment: (NOTE) SARS-CoV-2 target nucleic acids are NOT DETECTED.  The SARS-CoV-2 RNA is generally detectable in upper and lower respiratory specimens during the acute phase of infection.  The lowest concentration of SARS-CoV-2 viral copies this assay can detect is 250 copies / mL. A negative result does not preclude SARS-CoV-2 infection and should not be used as the sole basis for treatment or other patient management decisions.  A negative result may occur with improper specimen collection / handling, submission of specimen other than nasopharyngeal swab, presence of viral mutation(s) within the areas targeted by this assay, and inadequate number of viral copies (<250 copies / mL). A negative result must be combined with clinical observations, patient history, and epidemiological information.  Fact Sheet for Patients:   StrictlyIdeas.no  Fact Sheet for Healthcare Providers: BankingDealers.co.za  This test is not yet approved or  cleared by the [place] FDA and has been authorized for detection and/or diagnosis of SARS-CoV-2 by FDA under an Emergency Use Authorization (EUA).  This EUA will  remain in effect (meaning this test can be used) for the duration of the COVID-19 declaration under Section 564(b)(1) of the Act, 21 U.S.C. section 360bbb-3(b)(1), unless the authorization is terminated or revoked sooner.  Performed at [facility], [place] [address], [place], [place] [address]      Labs: BNP (last 3 results) No results for input(s): BNP in the last 8760 hours. Basic Metabolic Panel: Recent Labs  Lab 08/27/20 0340 08/28/20 0026 08/29/20 0145 08/30/20 0422 08/31/20 0359  NA 134* 133* 137 134* 135  K 3.6 3.7 3.9 3.3* 3.8  CL 100 102 103 102 100  CO2 26 23 26 24 28   GLUCOSE 124* 96 84 95 91  BUN 31* 28* 18 18 19   CREATININE 1.07 1.03 1.00 0.90 1.09  CALCIUM 8.2* 8.5* 8.6* 8.5* 8.9  MG 2.1  --   --  2.3 2.2   Liver Function Tests: Recent Labs  Lab 08/26/20 1330 08/27/20 0340 08/28/20 0026  AST 25 25 29   ALT 35 29 33  ALKPHOS 97 74 76  BILITOT 0.5 0.6 0.7  PROT 8.9* 7.3 7.6  ALBUMIN 3.0* 2.6* 2.9*   No results for input(s): LIPASE, AMYLASE in the last 168 hours. Recent Labs  Lab 08/26/20 2148  AMMONIA 20   CBC: Recent Labs  Lab 08/27/20 0340 08/28/20 0026 08/29/20 0145 08/30/20 0422 08/31/20 0359  WBC 7.9 6.6 5.0 4.8 4.7  NEUTROABS 6.2  --   --   --   --   HGB 12.3* 12.8* 13.0 12.9* 12.9*  HCT 37.5* 38.8* 38.0* 37.1* 39.3  MCV 99.7 96.3 96.2 94.9 96.8  PLT 150 154 136* 134* 142*   Cardiac Enzymes: Recent Labs  Lab 08/27/20 0340  CKTOTAL 35*   BNP: Invalid input(s): POCBNP CBG: Recent Labs  Lab 08/26/20 1957  GLUCAP 110*   D-Dimer No results for input(s): DDIMER in the last 72 hours. Hgb A1c No results for input(s): HGBA1C in the last 72 hours. Lipid Profile No results for input(s): CHOL, HDL, LDLCALC, TRIG, CHOLHDL, LDLDIRECT in the last 72 hours. Thyroid function studies No results for input(s): TSH, T4TOTAL, T3FREE, THYROIDAB in the last 72 hours.  Invalid input(s): FREET3 Anemia work up No results for  input(s): VITAMINB12, FOLATE, FERRITIN, TIBC, IRON, RETICCTPCT in the last 72 hours. Urinalysis    Component Value Date/Time   COLORURINE STRAW (A) 08/26/2020 1404   APPEARANCEUR CLEAR 08/26/2020 1404   APPEARANCEUR Cloudy (A) 08/03/2020 1420   LABSPEC 1.010 08/26/2020 1404   PHURINE 6.0 08/26/2020 1404   GLUCOSEU NEGATIVE 08/26/2020 1404   GLUCOSEU NEGATIVE 08/14/2011 1058   [place] 08/26/2020 1404  BILIRUBINUR NEGATIVE 08/26/2020 1404   BILIRUBINUR Negative 08/03/2020 1420   KETONESUR NEGATIVE 08/26/2020 1404   PROTEINUR NEGATIVE 08/26/2020 1404   UROBILINOGEN 0.2 08/14/2011 1058   NITRITE NEGATIVE 08/26/2020 1404   LEUKOCYTESUR NEGATIVE 08/26/2020 1404   Sepsis Labs Invalid input(s): PROCALCITONIN,  WBC,  LACTICIDVEN Microbiology Recent Results (from the past 240 hour(s))  Urine culture     Status: Abnormal   Collection Time: 08/26/20  8:55 PM   Specimen: Urine, Random  Result Value Ref Range Status   Specimen Description URINE, RANDOM  Final   Special Requests NONE  Final   Culture (A)  Final    <10,000 COLONIES/mL INSIGNIFICANT GROWTH Performed at [facility], [place] [address], [place], [place] [address]    Report Status 08/28/2020 FINAL  Final  Resp Panel by RT-PCR (Flu A&B, Covid) Nasopharyngeal Swab     Status: None   Collection Time: 08/27/20  8:27 AM   Specimen: Nasopharyngeal Swab; Nasopharyngeal(NP) swabs in vial transport medium  Result Value Ref Range Status   SARS Coronavirus 2 by RT PCR NEGATIVE NEGATIVE Final    Comment: (NOTE) SARS-CoV-2 target nucleic acids are NOT DETECTED.  The SARS-CoV-2 RNA is generally detectable in upper respiratory specimens during the acute phase of infection. The lowest concentration of SARS-CoV-2 viral copies this assay can detect is 138 copies/mL. A negative result does not preclude SARS-Cov-2 infection and should not be used as the sole basis for treatment or other patient management decisions. A negative  result may occur with  improper specimen collection/handling, submission of specimen other than nasopharyngeal swab, presence of viral mutation(s) within the areas targeted by this assay, and inadequate number of viral copies(<138 copies/mL). A negative result must be combined with clinical observations, patient history, and epidemiological information. The expected result is Negative.  Fact Sheet for Patients:  EntrepreneurPulse.com.au  Fact Sheet for Healthcare Providers:  IncredibleEmployment.be  This test is no t yet approved or cleared by the [place] FDA and  has been authorized for detection and/or diagnosis of SARS-CoV-2 by FDA under an Emergency Use Authorization (EUA). This EUA will remain  in effect (meaning this test can be used) for the duration of the COVID-19 declaration under Section 564(b)(1) of the Act, 21 U.S.C.section 360bbb-3(b)(1), unless the authorization is terminated  or revoked sooner.       Influenza A by PCR NEGATIVE NEGATIVE Final   Influenza B by PCR NEGATIVE NEGATIVE Final    Comment: (NOTE) The Xpert Xpress SARS-CoV-2/FLU/RSV plus assay is intended as an aid in the diagnosis of influenza from Nasopharyngeal swab specimens and should not be used as a sole basis for treatment. Nasal washings and aspirates are unacceptable for Xpert Xpress SARS-CoV-2/FLU/RSV testing.  Fact Sheet for Patients: EntrepreneurPulse.com.au  Fact Sheet for Healthcare Providers: IncredibleEmployment.be  This test is not yet approved or cleared by the [place] FDA and has been authorized for detection and/or diagnosis of SARS-CoV-2 by FDA under an Emergency Use Authorization (EUA). This EUA will remain in effect (meaning this test can be used) for the duration of the COVID-19 declaration under Section 564(b)(1) of the Act, 21 U.S.C. section 360bbb-3(b)(1), unless the authorization is  terminated or revoked.  Performed at [facility], [place] [address], [place], [place] [address]   SARS Coronavirus 2 by RT PCR (hospital order, performed in [facility] hospital lab) Nasopharyngeal Nasopharyngeal Swab     Status: None   Collection Time: 08/30/20 11:42 AM   Specimen: Nasopharyngeal Swab  Result Value Ref Range Status   SARS Coronavirus 2 NEGATIVE NEGATIVE Final    Comment: (NOTE) SARS-CoV-2 target nucleic acids are NOT DETECTED.  The SARS-CoV-2 RNA is generally detectable in upper and lower respiratory specimens during the acute phase of infection. The lowest concentration of SARS-CoV-2 viral copies this assay can detect is 250 copies / mL. A negative result does not preclude SARS-CoV-2 infection and should not be used as the sole basis for treatment or other patient management decisions.  A negative result may occur with improper specimen collection / handling, submission of specimen other than nasopharyngeal swab, presence of viral mutation(s) within the areas targeted by this assay, and inadequate number of viral copies (<250 copies / mL). A negative result must be combined with clinical observations, patient history, and epidemiological information.  Fact Sheet for Patients:   StrictlyIdeas.no  Fact Sheet for Healthcare Providers: BankingDealers.co.za  This test is not yet approved or  cleared by the [place] FDA and has been authorized for detection and/or diagnosis of SARS-CoV-2 by FDA under an Emergency Use Authorization (EUA).  This EUA will remain in effect (meaning this test can be used) for the duration of the COVID-19 declaration under Section 564(b)(1) of the Act, 21 U.S.C. section 360bbb-3(b)(1), unless the authorization is terminated or revoked sooner.  Performed at [facility], [place] [address], [place], [place] [address]      Time coordinating discharge: Over 30  minutes  SIGNED:   Mckinley Jewel, MD  [facility] 08/31/2020, 11:08 AM Pager   If 7PM-7AM, please contact night-coverage [email or web address]

## 2020-08-31 NOTE — Progress Notes (Signed)
  Speech Language Pathology Treatment: Dysphagia  Patient Details Name: MELECIO CUETO MRN: [id number] DOB: 1931/09/13 Today's Date: 08/31/2020 Time: 5208-0223 SLP Time Calculation (min) (ACUTE ONLY): 10 min  Assessment / Plan / Recommendation Clinical Impression  Pt observed with am tray, he is selective in the textures he attempts and has awareness that the sausage may not be the best. He tolerated potatoes and eggs and juice without complaint or observed difficulty. Pt is independent with management of mild dysphagia. Continue current diet will sign off.   HPI HPI: Pt is an 86 yo male presenting with worsening AMS. CTH and CXR negative. Previous MBS in August 2021 revealed a moderate pharyngeal dysphagia with severe vallecular residue with purees and solids, which he could not fully clear despite strategies. Trace penetration and aspiration x1 occurred with thin liquids, but cleared with reflexive cough. Dys 1 diet was started per pt preference with thin liquids. PMH includes: MG (dx in August 2021, recently started no steroid due to reports of dysphagia/choking), CAD, HTN, SSS s/p pacemaker, HLD, solitary kidney, seizure      SLP Plan  All goals met       Recommendations  Diet recommendations: Regular;Thin liquid Liquids provided via: Cup;Straw Medication Administration: Whole meds with liquid Supervision: Patient able to self feed Compensations: Slow rate;Small sips/bites                Oral Care Recommendations: Patient independent with oral care Follow up Recommendations: None Plan: All goals met       GO               Herbie Baltimore, MA CCC-SLP  Acute Rehabilitation Services Pager [phone] Office [phone]  Lynann Beaver 08/31/2020, 9:19 AM

## 2020-08-31 NOTE — Progress Notes (Signed)
Report called to [place] at [facility]. Discharge paperwork placed in packet for receiving facility. Patient awaiting transport by [facility].

## 2020-08-31 NOTE — Care Management Important Message (Signed)
Important Message  Patient Details  Name: [name] MRN: [id number] Date of Birth: 10-05-1930   Medicare Important Message Given:  Yes     [name] 08/31/2020, 12:38 PM

## 2020-09-01 DIAGNOSIS — N401 Enlarged prostate with lower urinary tract symptoms: Secondary | ICD-10-CM | POA: Diagnosis not present

## 2020-09-01 DIAGNOSIS — I495 Sick sinus syndrome: Secondary | ICD-10-CM | POA: Diagnosis not present

## 2020-09-01 DIAGNOSIS — G9341 Metabolic encephalopathy: Secondary | ICD-10-CM | POA: Diagnosis not present

## 2020-09-01 DIAGNOSIS — Z950 Presence of cardiac pacemaker: Secondary | ICD-10-CM | POA: Diagnosis not present

## 2020-09-01 DIAGNOSIS — M6281 Muscle weakness (generalized): Secondary | ICD-10-CM | POA: Diagnosis not present

## 2020-09-01 DIAGNOSIS — E782 Mixed hyperlipidemia: Secondary | ICD-10-CM | POA: Diagnosis not present

## 2020-09-01 DIAGNOSIS — K219 Gastro-esophageal reflux disease without esophagitis: Secondary | ICD-10-CM | POA: Diagnosis not present

## 2020-09-01 DIAGNOSIS — R2681 Unsteadiness on feet: Secondary | ICD-10-CM | POA: Diagnosis not present

## 2020-09-01 DIAGNOSIS — I10 Essential (primary) hypertension: Secondary | ICD-10-CM | POA: Diagnosis not present

## 2020-09-01 LAB — METHYLMALONIC ACID, SERUM: Methylmalonic Acid, Quantitative: 405 nmol/L — ABNORMAL HIGH (ref 0–378)

## 2020-09-03 ENCOUNTER — Telehealth: Payer: Self-pay | Admitting: Neurology

## 2020-09-03 DIAGNOSIS — K219 Gastro-esophageal reflux disease without esophagitis: Secondary | ICD-10-CM | POA: Diagnosis not present

## 2020-09-03 DIAGNOSIS — G9341 Metabolic encephalopathy: Secondary | ICD-10-CM | POA: Diagnosis not present

## 2020-09-03 DIAGNOSIS — N401 Enlarged prostate with lower urinary tract symptoms: Secondary | ICD-10-CM | POA: Diagnosis not present

## 2020-09-03 DIAGNOSIS — E782 Mixed hyperlipidemia: Secondary | ICD-10-CM | POA: Diagnosis not present

## 2020-09-03 DIAGNOSIS — M6281 Muscle weakness (generalized): Secondary | ICD-10-CM | POA: Diagnosis not present

## 2020-09-03 DIAGNOSIS — I495 Sick sinus syndrome: Secondary | ICD-10-CM | POA: Diagnosis not present

## 2020-09-03 DIAGNOSIS — Z950 Presence of cardiac pacemaker: Secondary | ICD-10-CM | POA: Diagnosis not present

## 2020-09-03 DIAGNOSIS — R2681 Unsteadiness on feet: Secondary | ICD-10-CM | POA: Diagnosis not present

## 2020-09-03 DIAGNOSIS — I10 Essential (primary) hypertension: Secondary | ICD-10-CM | POA: Diagnosis not present

## 2020-09-03 NOTE — Telephone Encounter (Signed)
Per Note from Access Nurse:  Caller states that her dad was referred to rehab and he is not cooperative, and he won let anyone touch him and she's wanting to see if Dr. Posey Pronto can talk to her dad because he is not cooperating with getting drawn or anything. He is thinking that they are a threat to him and not letting anyone touch him or get near him and he has dementia.

## 2020-09-06 ENCOUNTER — Encounter: Payer: Self-pay | Admitting: Counselor

## 2020-09-06 DIAGNOSIS — K219 Gastro-esophageal reflux disease without esophagitis: Secondary | ICD-10-CM | POA: Diagnosis not present

## 2020-09-06 DIAGNOSIS — M6281 Muscle weakness (generalized): Secondary | ICD-10-CM | POA: Diagnosis not present

## 2020-09-06 DIAGNOSIS — R2681 Unsteadiness on feet: Secondary | ICD-10-CM | POA: Diagnosis not present

## 2020-09-06 DIAGNOSIS — N401 Enlarged prostate with lower urinary tract symptoms: Secondary | ICD-10-CM | POA: Diagnosis not present

## 2020-09-06 DIAGNOSIS — Z950 Presence of cardiac pacemaker: Secondary | ICD-10-CM | POA: Diagnosis not present

## 2020-09-06 DIAGNOSIS — G9341 Metabolic encephalopathy: Secondary | ICD-10-CM | POA: Diagnosis not present

## 2020-09-06 DIAGNOSIS — I495 Sick sinus syndrome: Secondary | ICD-10-CM | POA: Diagnosis not present

## 2020-09-06 DIAGNOSIS — E782 Mixed hyperlipidemia: Secondary | ICD-10-CM | POA: Diagnosis not present

## 2020-09-06 DIAGNOSIS — I10 Essential (primary) hypertension: Secondary | ICD-10-CM | POA: Diagnosis not present

## 2020-09-06 NOTE — Telephone Encounter (Signed)
Is there a telephone number that is best to contact patient's care team?  Please contact his facility and see how patient was over the weekend.

## 2020-09-06 NOTE — Telephone Encounter (Signed)
Called and spoke to patients daughter Shirlean Mylar. Shirlean Mylar states that patient is still the same and has had no changes in his behavior this weekend. Patient is taking medication and eating well. Patients daughter stated that patient is delusional. He thinks that he is married to his caregiver and that they are planning a trip to [place], where they will be taking a Uber down to [employer]. Patients daughter also stated that patient stays in bed and will walk around room but will not leave his room.  Patient is still not allowing anyone to draw his blood and is not cooperating with PT.   Patient is currently staying in [place] located at: [address], [place], [place] [address] phone number: [phone]

## 2020-09-06 NOTE — Telephone Encounter (Signed)
Please contact facility for an update.  Unfortunately, with his delusional thoughts, there will be very little that I can do.  It's best to raise concerns pertaining to cooperation with rehab orders with his rehab team.

## 2020-09-07 NOTE — Telephone Encounter (Signed)
[facility] rehab back and no answer.

## 2020-09-07 NOTE — Telephone Encounter (Signed)
Called and call was answered by Marcie Bal at [facility]. My call was transferred and dropped.

## 2020-09-08 DIAGNOSIS — G9341 Metabolic encephalopathy: Secondary | ICD-10-CM | POA: Diagnosis not present

## 2020-09-08 DIAGNOSIS — R451 Restlessness and agitation: Secondary | ICD-10-CM | POA: Diagnosis not present

## 2020-09-08 DIAGNOSIS — Z950 Presence of cardiac pacemaker: Secondary | ICD-10-CM | POA: Diagnosis not present

## 2020-09-08 DIAGNOSIS — M6281 Muscle weakness (generalized): Secondary | ICD-10-CM | POA: Diagnosis not present

## 2020-09-08 DIAGNOSIS — I10 Essential (primary) hypertension: Secondary | ICD-10-CM | POA: Diagnosis not present

## 2020-09-08 DIAGNOSIS — N401 Enlarged prostate with lower urinary tract symptoms: Secondary | ICD-10-CM | POA: Diagnosis not present

## 2020-09-08 DIAGNOSIS — E782 Mixed hyperlipidemia: Secondary | ICD-10-CM | POA: Diagnosis not present

## 2020-09-08 DIAGNOSIS — I495 Sick sinus syndrome: Secondary | ICD-10-CM | POA: Diagnosis not present

## 2020-09-08 DIAGNOSIS — R2681 Unsteadiness on feet: Secondary | ICD-10-CM | POA: Diagnosis not present

## 2020-09-08 DIAGNOSIS — K219 Gastro-esophageal reflux disease without esophagitis: Secondary | ICD-10-CM | POA: Diagnosis not present

## 2020-09-08 DIAGNOSIS — G7000 Myasthenia gravis without (acute) exacerbation: Secondary | ICD-10-CM | POA: Diagnosis not present

## 2020-09-09 ENCOUNTER — Telehealth: Payer: Self-pay | Admitting: Neurology

## 2020-09-09 NOTE — Telephone Encounter (Signed)
Called and spoke to Nurse at [facility]. Nurse stated that patient is still refusing PT and care. Patient is also talking about going on vacation. The nurse also stated that patient is also refusing medications at times. Was informed by nurse that the nurse practitioner on duty has recently put patient on Seroquel in the evening and morning. No status on how that is helping patient due to last night being the first time it was given to patient.

## 2020-09-09 NOTE — Telephone Encounter (Signed)
Noted  

## 2020-09-13 DIAGNOSIS — K219 Gastro-esophageal reflux disease without esophagitis: Secondary | ICD-10-CM | POA: Diagnosis not present

## 2020-09-13 DIAGNOSIS — G9341 Metabolic encephalopathy: Secondary | ICD-10-CM | POA: Diagnosis not present

## 2020-09-13 DIAGNOSIS — I10 Essential (primary) hypertension: Secondary | ICD-10-CM | POA: Diagnosis not present

## 2020-09-13 DIAGNOSIS — N401 Enlarged prostate with lower urinary tract symptoms: Secondary | ICD-10-CM | POA: Diagnosis not present

## 2020-09-13 DIAGNOSIS — E782 Mixed hyperlipidemia: Secondary | ICD-10-CM | POA: Diagnosis not present

## 2020-09-13 DIAGNOSIS — Z950 Presence of cardiac pacemaker: Secondary | ICD-10-CM | POA: Diagnosis not present

## 2020-09-13 DIAGNOSIS — I495 Sick sinus syndrome: Secondary | ICD-10-CM | POA: Diagnosis not present

## 2020-09-13 DIAGNOSIS — M6281 Muscle weakness (generalized): Secondary | ICD-10-CM | POA: Diagnosis not present

## 2020-09-13 DIAGNOSIS — R2681 Unsteadiness on feet: Secondary | ICD-10-CM | POA: Diagnosis not present

## 2020-09-14 ENCOUNTER — Telehealth: Payer: Self-pay

## 2020-09-14 ENCOUNTER — Telehealth: Payer: Self-pay | Admitting: Neurology

## 2020-09-14 DIAGNOSIS — R600 Localized edema: Secondary | ICD-10-CM | POA: Diagnosis not present

## 2020-09-14 DIAGNOSIS — R2689 Other abnormalities of gait and mobility: Secondary | ICD-10-CM | POA: Diagnosis not present

## 2020-09-14 DIAGNOSIS — I10 Essential (primary) hypertension: Secondary | ICD-10-CM | POA: Diagnosis not present

## 2020-09-14 NOTE — Telephone Encounter (Signed)
Can you clarify if you discontinued seroquel per daughter and Rx was changed by you?

## 2020-09-14 NOTE — Telephone Encounter (Signed)
Recommend continuing IVIG infusions as planned.  Please check that they have notified [facility] nurse where he is living, so they can coordinate this accordingly. Thanks.

## 2020-09-14 NOTE — Telephone Encounter (Signed)
I have not made any changes to medications. He would need to speak with his provider at rehab about seroquel.

## 2020-09-14 NOTE — Telephone Encounter (Signed)
Daughter reports: Patient is in rehab and yesterday was the first day he was cooperative participating in therapy.   Also, patient is also due for an infusion this week. She said, "Should we put that on hold or try to get that done this week?"

## 2020-09-14 NOTE — Telephone Encounter (Signed)
Daughter called an advised

## 2020-09-14 NOTE — Telephone Encounter (Signed)
Please see note.

## 2020-09-15 DIAGNOSIS — I495 Sick sinus syndrome: Secondary | ICD-10-CM | POA: Diagnosis not present

## 2020-09-15 DIAGNOSIS — M6281 Muscle weakness (generalized): Secondary | ICD-10-CM | POA: Diagnosis not present

## 2020-09-15 DIAGNOSIS — R2681 Unsteadiness on feet: Secondary | ICD-10-CM | POA: Diagnosis not present

## 2020-09-15 DIAGNOSIS — Z950 Presence of cardiac pacemaker: Secondary | ICD-10-CM | POA: Diagnosis not present

## 2020-09-15 DIAGNOSIS — E782 Mixed hyperlipidemia: Secondary | ICD-10-CM | POA: Diagnosis not present

## 2020-09-15 DIAGNOSIS — K219 Gastro-esophageal reflux disease without esophagitis: Secondary | ICD-10-CM | POA: Diagnosis not present

## 2020-09-15 DIAGNOSIS — N401 Enlarged prostate with lower urinary tract symptoms: Secondary | ICD-10-CM | POA: Diagnosis not present

## 2020-09-15 DIAGNOSIS — G9341 Metabolic encephalopathy: Secondary | ICD-10-CM | POA: Diagnosis not present

## 2020-09-15 DIAGNOSIS — I10 Essential (primary) hypertension: Secondary | ICD-10-CM | POA: Diagnosis not present

## 2020-09-17 DIAGNOSIS — I10 Essential (primary) hypertension: Secondary | ICD-10-CM | POA: Diagnosis not present

## 2020-09-17 DIAGNOSIS — R451 Restlessness and agitation: Secondary | ICD-10-CM | POA: Diagnosis not present

## 2020-09-17 DIAGNOSIS — F0280 Dementia in other diseases classified elsewhere without behavioral disturbance: Secondary | ICD-10-CM | POA: Diagnosis not present

## 2020-09-17 DIAGNOSIS — R600 Localized edema: Secondary | ICD-10-CM | POA: Diagnosis not present

## 2020-09-18 DIAGNOSIS — R2681 Unsteadiness on feet: Secondary | ICD-10-CM | POA: Diagnosis not present

## 2020-09-18 DIAGNOSIS — F0280 Dementia in other diseases classified elsewhere without behavioral disturbance: Secondary | ICD-10-CM | POA: Diagnosis not present

## 2020-09-18 DIAGNOSIS — R2689 Other abnormalities of gait and mobility: Secondary | ICD-10-CM | POA: Diagnosis not present

## 2020-09-18 DIAGNOSIS — Z9181 History of falling: Secondary | ICD-10-CM | POA: Diagnosis not present

## 2020-09-18 DIAGNOSIS — K219 Gastro-esophageal reflux disease without esophagitis: Secondary | ICD-10-CM | POA: Diagnosis not present

## 2020-09-18 DIAGNOSIS — I10 Essential (primary) hypertension: Secondary | ICD-10-CM | POA: Diagnosis not present

## 2020-09-18 DIAGNOSIS — R1312 Dysphagia, oropharyngeal phase: Secondary | ICD-10-CM | POA: Diagnosis not present

## 2020-09-18 DIAGNOSIS — G7000 Myasthenia gravis without (acute) exacerbation: Secondary | ICD-10-CM | POA: Diagnosis not present

## 2020-09-18 DIAGNOSIS — M6281 Muscle weakness (generalized): Secondary | ICD-10-CM | POA: Diagnosis not present

## 2020-09-18 DIAGNOSIS — R41841 Cognitive communication deficit: Secondary | ICD-10-CM | POA: Diagnosis not present

## 2020-09-18 DIAGNOSIS — R4182 Altered mental status, unspecified: Secondary | ICD-10-CM | POA: Diagnosis not present

## 2020-09-18 DIAGNOSIS — D696 Thrombocytopenia, unspecified: Secondary | ICD-10-CM | POA: Diagnosis not present

## 2020-09-18 DIAGNOSIS — R600 Localized edema: Secondary | ICD-10-CM | POA: Diagnosis not present

## 2020-09-18 DIAGNOSIS — G928 Other toxic encephalopathy: Secondary | ICD-10-CM | POA: Diagnosis not present

## 2020-09-20 DIAGNOSIS — Z950 Presence of cardiac pacemaker: Secondary | ICD-10-CM | POA: Diagnosis not present

## 2020-09-20 DIAGNOSIS — E782 Mixed hyperlipidemia: Secondary | ICD-10-CM | POA: Diagnosis not present

## 2020-09-20 DIAGNOSIS — R2681 Unsteadiness on feet: Secondary | ICD-10-CM | POA: Diagnosis not present

## 2020-09-20 DIAGNOSIS — I495 Sick sinus syndrome: Secondary | ICD-10-CM | POA: Diagnosis not present

## 2020-09-20 DIAGNOSIS — N401 Enlarged prostate with lower urinary tract symptoms: Secondary | ICD-10-CM | POA: Diagnosis not present

## 2020-09-20 DIAGNOSIS — K219 Gastro-esophageal reflux disease without esophagitis: Secondary | ICD-10-CM | POA: Diagnosis not present

## 2020-09-20 DIAGNOSIS — M6281 Muscle weakness (generalized): Secondary | ICD-10-CM | POA: Diagnosis not present

## 2020-09-20 DIAGNOSIS — I10 Essential (primary) hypertension: Secondary | ICD-10-CM | POA: Diagnosis not present

## 2020-09-20 DIAGNOSIS — G9341 Metabolic encephalopathy: Secondary | ICD-10-CM | POA: Diagnosis not present

## 2020-09-21 DIAGNOSIS — I10 Essential (primary) hypertension: Secondary | ICD-10-CM | POA: Diagnosis not present

## 2020-09-21 DIAGNOSIS — G7000 Myasthenia gravis without (acute) exacerbation: Secondary | ICD-10-CM | POA: Diagnosis not present

## 2020-09-21 DIAGNOSIS — F0280 Dementia in other diseases classified elsewhere without behavioral disturbance: Secondary | ICD-10-CM | POA: Diagnosis not present

## 2020-09-21 DIAGNOSIS — K219 Gastro-esophageal reflux disease without esophagitis: Secondary | ICD-10-CM | POA: Diagnosis not present

## 2020-09-21 DIAGNOSIS — D696 Thrombocytopenia, unspecified: Secondary | ICD-10-CM | POA: Diagnosis not present

## 2020-09-21 DIAGNOSIS — R600 Localized edema: Secondary | ICD-10-CM | POA: Diagnosis not present

## 2020-09-22 DIAGNOSIS — I10 Essential (primary) hypertension: Secondary | ICD-10-CM | POA: Diagnosis not present

## 2020-09-22 DIAGNOSIS — I495 Sick sinus syndrome: Secondary | ICD-10-CM | POA: Diagnosis not present

## 2020-09-22 DIAGNOSIS — Z950 Presence of cardiac pacemaker: Secondary | ICD-10-CM | POA: Diagnosis not present

## 2020-09-22 DIAGNOSIS — G9341 Metabolic encephalopathy: Secondary | ICD-10-CM | POA: Diagnosis not present

## 2020-09-22 DIAGNOSIS — M6281 Muscle weakness (generalized): Secondary | ICD-10-CM | POA: Diagnosis not present

## 2020-09-22 DIAGNOSIS — E782 Mixed hyperlipidemia: Secondary | ICD-10-CM | POA: Diagnosis not present

## 2020-09-22 DIAGNOSIS — K219 Gastro-esophageal reflux disease without esophagitis: Secondary | ICD-10-CM | POA: Diagnosis not present

## 2020-09-22 DIAGNOSIS — R2681 Unsteadiness on feet: Secondary | ICD-10-CM | POA: Diagnosis not present

## 2020-09-22 DIAGNOSIS — N401 Enlarged prostate with lower urinary tract symptoms: Secondary | ICD-10-CM | POA: Diagnosis not present

## 2020-09-22 NOTE — Telephone Encounter (Signed)
Has this been completed?  Sending to clinical staff for review: Okay to sign/close encounter or is further follow up needed? 

## 2020-09-23 ENCOUNTER — Encounter: Payer: Self-pay | Admitting: Neurology

## 2020-09-23 NOTE — Progress Notes (Signed)
Received paperwork from [facility] that patient already has approval on file for this medication valid from 23/23/21 to 12/21/20. [id number] / [id number]. Sent to chart for scanning.

## 2020-09-24 ENCOUNTER — Telehealth: Payer: Self-pay | Admitting: Internal Medicine

## 2020-09-24 DIAGNOSIS — I10 Essential (primary) hypertension: Secondary | ICD-10-CM | POA: Diagnosis not present

## 2020-09-24 DIAGNOSIS — E7800 Pure hypercholesterolemia, unspecified: Secondary | ICD-10-CM | POA: Diagnosis not present

## 2020-09-24 DIAGNOSIS — N400 Enlarged prostate without lower urinary tract symptoms: Secondary | ICD-10-CM | POA: Diagnosis not present

## 2020-09-24 DIAGNOSIS — G4700 Insomnia, unspecified: Secondary | ICD-10-CM | POA: Diagnosis not present

## 2020-09-24 NOTE — Telephone Encounter (Signed)
Isaw pt in November  Fluid status was not bad    at that time  It is now almost 2 months  Last labs were in December  I cannot make recommendatsion without labs (CBC, CMET, BNP) and having someone see him  He is at a facility that has a physician who should be seeing him  Otherwise need appt to get seen in clinic

## 2020-09-24 NOTE — Telephone Encounter (Signed)
Daughter wrote in that her father is in memory care   Has significant swellig  When I saw him in Nov he did not     I am reluctant to make changes in meds unless seen and labs drawn Could he potentially come through on Monday while I dod and eyeball, get labs.

## 2020-09-24 NOTE — Telephone Encounter (Signed)
Spoke with patient's daughter.  I let her know Dr. Alan Ripper recommendations.  She will be seeing him again over the weekend.  Called [facility], [place]. I spoke with Lilly the nurse. They have drawn labs today, but no BNP and he is on the list for a provider to see him tomorrow. She was unaware of his weight change and was adding that to his list as well as the recommendation for BNP in addition to the other labs drawn.  He has a follow up in cardiology at the end of January.

## 2020-09-24 NOTE — Telephone Encounter (Signed)
Pt c/o swelling: STAT is pt has developed SOB within 24 hours  1) How much weight have you gained and in what time span? 20 lbs in 3 weeks  2) If swelling, where is the swelling located? Knee down  3) Are you currently taking a fluid pill? spirolaction  4) Are you currently SOB? no  5) Do you have a log of your daily weights (if so, list)?  157 lbs 3 weeks ago, 173 lbs now  6) Have you gained 3 pounds in a day or 5 pounds in a week? yes  7) Have you traveled recently? no  Patient's daughter states the patient is having swelling in his legs from the knee down. She states he has gained 20 lbs in 3 weeks. She states he has been in a manic phase and was standing for 3 days so it may have gotten worse. She states he does have compression socks, but it is hard for her to get him to rest and elevate his feet. She states he has also been eating a lot and that may have caused him to gain the weight as well. She states he was recently put on Risperdal .5 mg for his dementia. She states he also takes a sedative as needed. Please advise.

## 2020-09-27 DIAGNOSIS — Z029 Encounter for administrative examinations, unspecified: Secondary | ICD-10-CM | POA: Diagnosis not present

## 2020-09-27 DIAGNOSIS — R600 Localized edema: Secondary | ICD-10-CM | POA: Diagnosis not present

## 2020-09-27 DIAGNOSIS — I10 Essential (primary) hypertension: Secondary | ICD-10-CM | POA: Diagnosis not present

## 2020-09-28 NOTE — Telephone Encounter (Signed)
Addendum  Rec'd notification that dose change of IVIG has been denied.   Mr. Plush has AChR antibody positive generalized myasthenia gravis, diagnosed in August 2021, who continues to have moderate dysphagia and dysarthria despite being on IVIG every 28 days and prednisone 40mg  daily.  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 Unfortunately, his disease is not adequately controlled on IVIG 1g/kg every 28 days and prednisone 20mg  daily and he remains symptomatic.    Immunosuppressants such as azathioprine or mycofenolate mofetil will take 6 months to see therapeutic benefit, so there is no role for these medications in an acute exacerbation.   Therefore, in the acute setting, our options for management is limited to increasing the frequency of IVIG to every 21 days or plasmapheresis, and I do not favor the latter given the need for in-patient admission.   If additional details are needed to support this appeal for IVIG dose adjustment to 1mg /kg every 21 days, kindly contact my office.  Jonthan Leite K. Posey Pronto, DO

## 2020-09-29 ENCOUNTER — Ambulatory Visit: Payer: Medicare Other | Admitting: Internal Medicine

## 2020-09-29 DIAGNOSIS — R159 Full incontinence of feces: Secondary | ICD-10-CM | POA: Diagnosis not present

## 2020-09-29 DIAGNOSIS — Z7902 Long term (current) use of antithrombotics/antiplatelets: Secondary | ICD-10-CM | POA: Diagnosis not present

## 2020-09-29 DIAGNOSIS — N39498 Other specified urinary incontinence: Secondary | ICD-10-CM | POA: Diagnosis not present

## 2020-09-29 DIAGNOSIS — D696 Thrombocytopenia, unspecified: Secondary | ICD-10-CM | POA: Diagnosis not present

## 2020-09-29 DIAGNOSIS — H547 Unspecified visual loss: Secondary | ICD-10-CM | POA: Diagnosis not present

## 2020-09-29 DIAGNOSIS — I6529 Occlusion and stenosis of unspecified carotid artery: Secondary | ICD-10-CM | POA: Diagnosis not present

## 2020-09-29 DIAGNOSIS — H9193 Unspecified hearing loss, bilateral: Secondary | ICD-10-CM | POA: Diagnosis not present

## 2020-09-29 DIAGNOSIS — I2510 Atherosclerotic heart disease of native coronary artery without angina pectoris: Secondary | ICD-10-CM | POA: Diagnosis not present

## 2020-09-29 DIAGNOSIS — R1310 Dysphagia, unspecified: Secondary | ICD-10-CM | POA: Diagnosis not present

## 2020-09-29 DIAGNOSIS — Z7982 Long term (current) use of aspirin: Secondary | ICD-10-CM | POA: Diagnosis not present

## 2020-09-29 DIAGNOSIS — F0390 Unspecified dementia without behavioral disturbance: Secondary | ICD-10-CM | POA: Diagnosis not present

## 2020-09-29 DIAGNOSIS — R569 Unspecified convulsions: Secondary | ICD-10-CM | POA: Diagnosis not present

## 2020-09-29 DIAGNOSIS — H15009 Unspecified scleritis, unspecified eye: Secondary | ICD-10-CM | POA: Diagnosis not present

## 2020-09-29 DIAGNOSIS — I495 Sick sinus syndrome: Secondary | ICD-10-CM | POA: Diagnosis not present

## 2020-09-29 DIAGNOSIS — I872 Venous insufficiency (chronic) (peripheral): Secondary | ICD-10-CM | POA: Diagnosis not present

## 2020-09-29 DIAGNOSIS — Q600 Renal agenesis, unilateral: Secondary | ICD-10-CM | POA: Diagnosis not present

## 2020-09-29 DIAGNOSIS — N401 Enlarged prostate with lower urinary tract symptoms: Secondary | ICD-10-CM | POA: Diagnosis not present

## 2020-09-29 DIAGNOSIS — G7000 Myasthenia gravis without (acute) exacerbation: Secondary | ICD-10-CM | POA: Diagnosis not present

## 2020-09-29 DIAGNOSIS — I10 Essential (primary) hypertension: Secondary | ICD-10-CM | POA: Diagnosis not present

## 2020-09-29 DIAGNOSIS — K219 Gastro-esophageal reflux disease without esophagitis: Secondary | ICD-10-CM | POA: Diagnosis not present

## 2020-09-29 DIAGNOSIS — Z950 Presence of cardiac pacemaker: Secondary | ICD-10-CM | POA: Diagnosis not present

## 2020-09-30 DIAGNOSIS — I2510 Atherosclerotic heart disease of native coronary artery without angina pectoris: Secondary | ICD-10-CM | POA: Diagnosis not present

## 2020-09-30 DIAGNOSIS — F0390 Unspecified dementia without behavioral disturbance: Secondary | ICD-10-CM | POA: Diagnosis not present

## 2020-09-30 DIAGNOSIS — I872 Venous insufficiency (chronic) (peripheral): Secondary | ICD-10-CM | POA: Diagnosis not present

## 2020-09-30 DIAGNOSIS — G7000 Myasthenia gravis without (acute) exacerbation: Secondary | ICD-10-CM | POA: Diagnosis not present

## 2020-09-30 DIAGNOSIS — I10 Essential (primary) hypertension: Secondary | ICD-10-CM | POA: Diagnosis not present

## 2020-09-30 DIAGNOSIS — R1310 Dysphagia, unspecified: Secondary | ICD-10-CM | POA: Diagnosis not present

## 2020-10-07 DIAGNOSIS — G7000 Myasthenia gravis without (acute) exacerbation: Secondary | ICD-10-CM | POA: Diagnosis not present

## 2020-10-07 DIAGNOSIS — R1310 Dysphagia, unspecified: Secondary | ICD-10-CM | POA: Diagnosis not present

## 2020-10-07 DIAGNOSIS — I10 Essential (primary) hypertension: Secondary | ICD-10-CM | POA: Diagnosis not present

## 2020-10-07 DIAGNOSIS — I872 Venous insufficiency (chronic) (peripheral): Secondary | ICD-10-CM | POA: Diagnosis not present

## 2020-10-07 DIAGNOSIS — F0390 Unspecified dementia without behavioral disturbance: Secondary | ICD-10-CM | POA: Diagnosis not present

## 2020-10-07 DIAGNOSIS — I2510 Atherosclerotic heart disease of native coronary artery without angina pectoris: Secondary | ICD-10-CM | POA: Diagnosis not present

## 2020-10-08 DIAGNOSIS — R1310 Dysphagia, unspecified: Secondary | ICD-10-CM | POA: Diagnosis not present

## 2020-10-08 DIAGNOSIS — I872 Venous insufficiency (chronic) (peripheral): Secondary | ICD-10-CM | POA: Diagnosis not present

## 2020-10-08 DIAGNOSIS — I2510 Atherosclerotic heart disease of native coronary artery without angina pectoris: Secondary | ICD-10-CM | POA: Diagnosis not present

## 2020-10-08 DIAGNOSIS — F0390 Unspecified dementia without behavioral disturbance: Secondary | ICD-10-CM | POA: Diagnosis not present

## 2020-10-08 DIAGNOSIS — G7000 Myasthenia gravis without (acute) exacerbation: Secondary | ICD-10-CM | POA: Diagnosis not present

## 2020-10-08 DIAGNOSIS — I10 Essential (primary) hypertension: Secondary | ICD-10-CM | POA: Diagnosis not present

## 2020-10-11 DIAGNOSIS — I10 Essential (primary) hypertension: Secondary | ICD-10-CM | POA: Diagnosis not present

## 2020-10-11 DIAGNOSIS — G7000 Myasthenia gravis without (acute) exacerbation: Secondary | ICD-10-CM | POA: Diagnosis not present

## 2020-10-11 DIAGNOSIS — F0390 Unspecified dementia without behavioral disturbance: Secondary | ICD-10-CM | POA: Diagnosis not present

## 2020-10-11 DIAGNOSIS — I2510 Atherosclerotic heart disease of native coronary artery without angina pectoris: Secondary | ICD-10-CM | POA: Diagnosis not present

## 2020-10-11 DIAGNOSIS — R1310 Dysphagia, unspecified: Secondary | ICD-10-CM | POA: Diagnosis not present

## 2020-10-11 DIAGNOSIS — I872 Venous insufficiency (chronic) (peripheral): Secondary | ICD-10-CM | POA: Diagnosis not present

## 2020-10-12 ENCOUNTER — Telehealth: Payer: Self-pay | Admitting: Neurology

## 2020-10-12 DIAGNOSIS — M79674 Pain in right toe(s): Secondary | ICD-10-CM | POA: Diagnosis not present

## 2020-10-12 DIAGNOSIS — M79675 Pain in left toe(s): Secondary | ICD-10-CM | POA: Diagnosis not present

## 2020-10-12 DIAGNOSIS — B351 Tinea unguium: Secondary | ICD-10-CM | POA: Diagnosis not present

## 2020-10-12 NOTE — Telephone Encounter (Signed)
Patient's daughter called in wanting some recommendations of support groups for family members of people with dementia.

## 2020-10-12 NOTE — Telephone Encounter (Signed)
I have a handout I can get and mail to her.

## 2020-10-13 DIAGNOSIS — I872 Venous insufficiency (chronic) (peripheral): Secondary | ICD-10-CM | POA: Diagnosis not present

## 2020-10-13 DIAGNOSIS — I10 Essential (primary) hypertension: Secondary | ICD-10-CM | POA: Diagnosis not present

## 2020-10-13 DIAGNOSIS — F0390 Unspecified dementia without behavioral disturbance: Secondary | ICD-10-CM | POA: Diagnosis not present

## 2020-10-13 DIAGNOSIS — R1310 Dysphagia, unspecified: Secondary | ICD-10-CM | POA: Diagnosis not present

## 2020-10-13 DIAGNOSIS — G7000 Myasthenia gravis without (acute) exacerbation: Secondary | ICD-10-CM | POA: Diagnosis not present

## 2020-10-13 DIAGNOSIS — I2510 Atherosclerotic heart disease of native coronary artery without angina pectoris: Secondary | ICD-10-CM | POA: Diagnosis not present

## 2020-10-15 DIAGNOSIS — G7000 Myasthenia gravis without (acute) exacerbation: Secondary | ICD-10-CM | POA: Diagnosis not present

## 2020-10-15 DIAGNOSIS — I872 Venous insufficiency (chronic) (peripheral): Secondary | ICD-10-CM | POA: Diagnosis not present

## 2020-10-15 DIAGNOSIS — F0390 Unspecified dementia without behavioral disturbance: Secondary | ICD-10-CM | POA: Diagnosis not present

## 2020-10-15 DIAGNOSIS — I2510 Atherosclerotic heart disease of native coronary artery without angina pectoris: Secondary | ICD-10-CM | POA: Diagnosis not present

## 2020-10-15 DIAGNOSIS — I10 Essential (primary) hypertension: Secondary | ICD-10-CM | POA: Diagnosis not present

## 2020-10-15 DIAGNOSIS — R1310 Dysphagia, unspecified: Secondary | ICD-10-CM | POA: Diagnosis not present

## 2020-10-18 ENCOUNTER — Ambulatory Visit: Payer: Medicare Other | Admitting: Internal Medicine

## 2020-10-19 DIAGNOSIS — A158 Other respiratory tuberculosis: Secondary | ICD-10-CM | POA: Diagnosis not present

## 2020-10-20 DIAGNOSIS — G7000 Myasthenia gravis without (acute) exacerbation: Secondary | ICD-10-CM | POA: Diagnosis not present

## 2020-10-20 DIAGNOSIS — R1310 Dysphagia, unspecified: Secondary | ICD-10-CM | POA: Diagnosis not present

## 2020-10-20 DIAGNOSIS — I10 Essential (primary) hypertension: Secondary | ICD-10-CM | POA: Diagnosis not present

## 2020-10-20 DIAGNOSIS — F0390 Unspecified dementia without behavioral disturbance: Secondary | ICD-10-CM | POA: Diagnosis not present

## 2020-10-20 DIAGNOSIS — I872 Venous insufficiency (chronic) (peripheral): Secondary | ICD-10-CM | POA: Diagnosis not present

## 2020-10-20 DIAGNOSIS — I2510 Atherosclerotic heart disease of native coronary artery without angina pectoris: Secondary | ICD-10-CM | POA: Diagnosis not present

## 2020-10-21 DIAGNOSIS — I872 Venous insufficiency (chronic) (peripheral): Secondary | ICD-10-CM | POA: Diagnosis not present

## 2020-10-21 DIAGNOSIS — R1310 Dysphagia, unspecified: Secondary | ICD-10-CM | POA: Diagnosis not present

## 2020-10-21 DIAGNOSIS — I2510 Atherosclerotic heart disease of native coronary artery without angina pectoris: Secondary | ICD-10-CM | POA: Diagnosis not present

## 2020-10-21 DIAGNOSIS — G7000 Myasthenia gravis without (acute) exacerbation: Secondary | ICD-10-CM | POA: Diagnosis not present

## 2020-10-21 DIAGNOSIS — I10 Essential (primary) hypertension: Secondary | ICD-10-CM | POA: Diagnosis not present

## 2020-10-21 DIAGNOSIS — F0390 Unspecified dementia without behavioral disturbance: Secondary | ICD-10-CM | POA: Diagnosis not present

## 2020-10-26 DIAGNOSIS — G7000 Myasthenia gravis without (acute) exacerbation: Secondary | ICD-10-CM | POA: Diagnosis not present

## 2020-10-26 DIAGNOSIS — I872 Venous insufficiency (chronic) (peripheral): Secondary | ICD-10-CM | POA: Diagnosis not present

## 2020-10-26 DIAGNOSIS — F0390 Unspecified dementia without behavioral disturbance: Secondary | ICD-10-CM | POA: Diagnosis not present

## 2020-10-26 DIAGNOSIS — I10 Essential (primary) hypertension: Secondary | ICD-10-CM | POA: Diagnosis not present

## 2020-10-26 DIAGNOSIS — R1310 Dysphagia, unspecified: Secondary | ICD-10-CM | POA: Diagnosis not present

## 2020-10-26 DIAGNOSIS — I2510 Atherosclerotic heart disease of native coronary artery without angina pectoris: Secondary | ICD-10-CM | POA: Diagnosis not present

## 2020-10-27 ENCOUNTER — Encounter: Payer: Self-pay | Admitting: Counselor

## 2020-10-27 ENCOUNTER — Ambulatory Visit: Payer: Medicare Other | Admitting: Psychology

## 2020-10-27 ENCOUNTER — Other Ambulatory Visit: Payer: Self-pay

## 2020-10-27 ENCOUNTER — Ambulatory Visit (INDEPENDENT_AMBULATORY_CARE_PROVIDER_SITE_OTHER): Payer: Medicare Other | Admitting: Counselor

## 2020-10-27 DIAGNOSIS — F09 Unspecified mental disorder due to known physiological condition: Secondary | ICD-10-CM

## 2020-10-27 DIAGNOSIS — F0390 Unspecified dementia without behavioral disturbance: Secondary | ICD-10-CM | POA: Diagnosis not present

## 2020-10-27 DIAGNOSIS — I10 Essential (primary) hypertension: Secondary | ICD-10-CM | POA: Diagnosis not present

## 2020-10-27 DIAGNOSIS — R4189 Other symptoms and signs involving cognitive functions and awareness: Secondary | ICD-10-CM

## 2020-10-27 DIAGNOSIS — G7000 Myasthenia gravis without (acute) exacerbation: Secondary | ICD-10-CM | POA: Diagnosis not present

## 2020-10-27 DIAGNOSIS — F22 Delusional disorders: Secondary | ICD-10-CM

## 2020-10-27 DIAGNOSIS — I872 Venous insufficiency (chronic) (peripheral): Secondary | ICD-10-CM | POA: Diagnosis not present

## 2020-10-27 DIAGNOSIS — I2510 Atherosclerotic heart disease of native coronary artery without angina pectoris: Secondary | ICD-10-CM | POA: Diagnosis not present

## 2020-10-27 DIAGNOSIS — Z8669 Personal history of other diseases of the nervous system and sense organs: Secondary | ICD-10-CM

## 2020-10-27 DIAGNOSIS — R1310 Dysphagia, unspecified: Secondary | ICD-10-CM | POA: Diagnosis not present

## 2020-10-27 NOTE — Progress Notes (Signed)
   Psychometrist Note   Cognitive testing was administered to [name] by Milana Kidney, B.S. (Technician) under the supervision of Alphonzo Severance, Psy.D., ABN. Mr. Bougher was able to tolerate all test procedures. Dr. Nicole Kindred met with the patient as needed to manage any emotional reactions to the testing procedures. Rest breaks were offered.    The battery of tests administered was selected by Dr. Nicole Kindred with consideration to the patient's current level of functioning, the nature of his symptoms, emotional and behavioral responses during the interview, level of literacy, observed level of motivation/effort, and the nature of the referral question. This battery was communicated to the psychometrist. Communication between Dr. Nicole Kindred and the psychometrist was ongoing throughout the evaluation and Dr. Nicole Kindred was immediately accessible at all times. Dr. Nicole Kindred provided supervision to the technician on the date of this service, to the extent necessary to assure the quality of all services provided.    Mr. Maharaj will return in approximately one week for an interactive feedback session with Dr. Nicole Kindred, at which time test performance, clinical impressions, and treatment recommendations will be reviewed in detail. The patient understands he can contact our office should he require our assistance before this time.   A total of 75 minutes of billable time were spent with [name] by the technician, including test administration and scoring time. Billing for these services is reflected in Dr. Les Pou note.   This note reflects time spent with the psychometrician and does not include test scores, clinical history, or any interpretations made by Dr. Nicole Kindred. The full report will follow in a separate note.

## 2020-10-27 NOTE — Progress Notes (Signed)
[facility]  Patient Name: [name] MRN: [id number] Date of Birth: 03-29-1931 Age: 87 y.o. Education: 14 years  Referral Circumstances and Background Information  [name] is a 87 y.o., right-hand dominant, widowed man with a history of seropositive myasthenia gravis, CAD HTN, and SSS s/p PPM. He is under the care of Dr. Posey Pronto with our neuromuscular neurology practice although he was referred following a hospitalization in December, 2021. During that episode of care, [name] was brought to the ED by his family for progressively worsening confusion and behavior in the setting of recent prednisone use. 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 I see that he has since been transferred to [facility].   On interview, the patient reported that he is not having any problems with memory and thinking and isn't sure why his daughter is concerned about his cognitive abilities. I was able to talk to his daughter apart from him, with his assent, and the first changes were actually noticed by his neighbor as long as 2 years ago. His neighbor said that he would just sit there "for days" and wouldn't pick up the phone when she called (they had been neighbors for 20 years). He broke his arm in October, 2020, and stayed with his daughter for 4 months who noticed that he was doing "strange things," such as littering trash on the floor of their house. He also seemed withdrawn and disengaged. He was not yet repeating himself or overtly confused, although he did become obsessed with his physical therapist, as if she was "the most important person in his life." He wanted to take his CNA and physical therapist to [place] and give people large sums of money. It sounds like others also noticed his problems, the pastor at his church was asking if he  was ok, although he was not manifesting profound cognitive problems as per his daughter. They went on vacation in May, 2021 and he seemed somewhat confused but he was still taking care of himself. Upon returning, he started making impulsive decisions and decided to sell his house, and they started noticing more in the way of problems at that time. He was essentially not able to participate in the sale of the house at all. He wanted to change the price of his house after putting it on the market but then changed his mind and ended up selling it to the original bidder. He then moved into an apartment, became symptomatic of his myasthenia gravis, and continued to cognitively decline at that time (around July, 2021).   At present, his daughter stated that half the time, he is "totally delusional" and he will conflate various events with one another. This tendency was noticed during the interview, as the patient would make illogical connections between things and had a loosening of associations. It sounds like he is essentially unable to have a conversation due to his tangential thought process and as a result, it is somewhat difficult to assess his other symptoms. He developed a delusion surrounding his caretaker and eventually thought that they were married. The family were worried that he might be exploited and took control of his finances and started keeping a closer eye on things at that time. He is paranoid and thinks that his daughters are trying to take his things. They have not noticed any overt visual hallucinations or  auditory hallucinations outside of his episode of encephalopathy/delirium.   At present, he is getting significant help and is in the memory unit at [facility] and has been more stable. He was changed from Seroquel to Risperidal and that helped "a lot." He is getting help with some basic ADLs such as showering, although he is able to toilet independently and eat independently. He is able  to dress himself. He has relinquished all of his instrumental activities of daily living, his daughters took over his finances around July of 2021. He underwent on the road driving evaluation but he failed that and surrendered his license. He was also having significant issues with incontinence around the time he became psychotic although he is doing better in that regard now. They have healthcare and regular power of attorney, which was drawn up many years ago.   Past Medical History and Review of Relevant Studies   Patient Active Problem List   Diagnosis Date Noted  . Benign prostatic hyperplasia with urinary frequency 08/27/2020  . Myasthenia gravis ([place]) 08/27/2020  . AMS (altered mental status) 08/27/2020  . Toxic metabolic encephalopathy 54/62/7035  . Stroke-like symptom 05/12/2020  . Leukocytosis 05/12/2020  . Macrocytosis 05/12/2020  . Stroke-like symptoms 05/12/2020  . Closed fracture of right distal humerus 07/03/2019  . Carotid artery disease ([place]) 09/09/2018  . Pacemaker-St.Jude 07/03/2012  . Bruit 09/28/2011  . Tongue lesion 08/15/2011  . Essential hypertension 06/23/2010  . Mixed hyperlipidemia 09/22/2008  . Coronary artery disease involving native coronary artery of native heart without angina pectoris 09/22/2008  . Edema 09/22/2008  . SSS (sick sinus syndrome) ([place]) 09/22/2008    Review of Neuroimaging and Relevant Medical History: The patient has a CT Head from 08/26/2020 that shows mild to moderate volume loss in a generalized distribution. There may be just a bit more in the frontal and parietal lobes. There is a mild burden of hypodensity in the subcortical cerebral white matter.    Current Outpatient Medications  Medication Sig Dispense Refill  . amLODipine (NORVASC) 5 MG tablet Take 1 tablet by mouth once daily (Patient taking differently: Take 5 mg by mouth daily.) 90 tablet 0  . aspirin 81 MG tablet Take 81 mg by mouth daily.     . clopidogrel (PLAVIX) 75 MG  tablet Take 1 tablet by mouth once daily (Patient taking differently: No sig reported) 90 tablet 1  . finasteride (PROSCAR) 5 MG tablet Take 5 mg by mouth daily.    Marland Kitchen GAMMAGARD 5 GM/50ML SOLN every 21 ( twenty-one) days.    Marland Kitchen NITROSTAT 0.4 MG SL tablet DISSOLVE ONE TABLET UNDER THE TONGUE EVERY 5 MINUTES AS NEEDED FOR CHEST PAIN.  DO NOT EXCEED A TOTAL OF 3 DOSES IN 15 MINUTES (Patient taking differently: Place 0.4 mg under the tongue every 5 (five) minutes x 3 doses as needed for chest pain.) 75 tablet 2  . pantoprazole (PROTONIX) 40 MG tablet Take 1 tablet (40 mg total) by mouth daily. (Patient taking differently: Take 40 mg by mouth daily before breakfast.) 90 tablet 1  . predniSONE (DELTASONE) 20 MG tablet Take 1 tablet (20 mg total) by mouth daily with breakfast. 180 tablet 2  . pyridostigmine (MESTINON) 60 MG tablet Take 1 tablet at 7am, 10am 1pm, and 5pm (Patient taking differently: Take 60 mg by mouth See admin instructions. Take 60 mg by mouth at 7 AM, 10 AM, 1 PM, and 5 PM) 480 tablet 3  . QUEtiapine (SEROQUEL) 25 MG tablet Take 1  tablet (25 mg total) by mouth at bedtime as needed. 30 tablet 0  . spironolactone (ALDACTONE) 25 MG tablet Take 0.5 tablets (12.5 mg total) by mouth daily. 45 tablet 1   No current facility-administered medications for this visit.   Family History  Problem Relation Age of Onset  . Arthritis/Rheumatoid Mother   . Heart disease Mother   . COPD Father   . Alcohol abuse Son    There is a family history of dementia. His sister developed dementia of some sort in her 24s, they think it was diagnosed as Alzheimer's. His other sister is starting to manifest signs of dementia and seems confused. He is the youngest of 19. There is no  family history of psychiatric illness.  Psychosocial History  Developmental, Educational and Employment History: The patients daughter stated that the patient was always very interested in history and did adequately in school although he  always thought that he wasn't smart. He wasn't held back to the best of her knowledge. He did two years of courses equivalent at [employer]. 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 He worked for a [employer] and seemed to do well. He transitioned to part time and then retired in his 6s.   Psychiatric History: The patient has a history of anxiety but essentially has no formal history of mental illness treatment. His wife apparently had Bipolar disorder and she was the focus.   Substance Use History: The patient doesn't drink and has never been much of a drinker. He hasn't ever smoked and does not use drugs.   Relationship History and Living Cimcumstances: The patient has been married twice, he divorced from his first wife. He was married to his second wife for 27 years and was widowed about 8 years ago. He has two daughters, [name] who is his healthcare POA and his other daughter handles most of the finances. Both his daughters live out of town  Mental Status and Behavioral Observations  Sensorium/Arousal: The patient's level of arousal was awake and alert. Hearing and vision were adequate for testing purposes. Orientation: The patient was oriented to person, place, and tenuously to time although he sounded as though he had been practicing and seemed to flip flop. He was aware of the current and past presidents but not the president before that.  Appearance: Dressed in appropriate, casual clothing with reasonable grooming and hygiene.  Behavior: The patient was pleasant and appropriate during the interview, although most history was taken apart from his presence due to his daughter's concern that it might agitate him.  Speech/language: The patient's speech was normal in rate, rhythm, and volume. No word finding errors or paraphasias noted.  Gait/Posture: Walked with a somewhat stooped posture using a walker.  Movement: Patient seemed somewhat tremulous  (postural/action tremor).  Social Comportment: Appropriate Mood: "Very good" Affect: Somewhat expansive Thought process/content: Loosening of associations were noted as the patient would often string thoughts together in a way that was not entirely logical. His thought content was somewhat superfluous and he would often bring up things unrelated to the evaluation. He seemed sticky and had a tendency to perseverate on things. No delusions noted but as above, he has had them in the past.  Safety: No safety concerns identified in this individual living in a structured setting with significant anosognosia Insight: Poor, anosognosia  MMSE - Mini Mental State Exam 10/27/2020  Orientation to time 5  Orientation to Place 5  Registration 3  Attention/ Calculation 3  Recall 1  Language- name 2 objects 2  Language- repeat 0  Language- follow 3 step command 1  Language- read & follow direction 1  Write a sentence 0  Copy design 0  Total score 21   Test Procedures  Wide Range Achievement Test - 4             Word Reading Repeatable Battery for the Assessment of Neuropsychological Status (Form A) Controlled Oral Word Association (F-A-S) Semantic Fluency (Animals) Trail Making Test A & B Complex Ideational Material Geriatric Depression Scale - Short Form  Plan  [name] was seen for a psychiatric diagnostic evaluation and neuropsychological testing. He is a pleasant, 87 year old man who was apparently in a normal state of cognitive health more or less until some deterioration in July, 2021 and then further deterioration after an episode of encephalopathy felt to be due to steroid psychosis due to prednisone for his myasthenia gravis. His daughter admits, however, that they have never been particularly close and did not talk a lot and there are some subtle changes going back a few years. He was noted to sit "for days" by his neighbor as far back as 2 years ago and also had some fixation on care  providers prior to the episode of encephalopathy. On interview today, he had loosening of associations and was not able to participate much in history giving. He has very poor insight and thinks nothing is wrong. He is residing in a memory care facility. Full and complete note with impressions, recommendations, and interpretation of test data to follow.   [name] Roberto Kindred, PsyD, [place] Clinical Neuropsychologist  Informed Consent and Coding/Compliance  Risks and benefits of the evaluation were discussed with the patient prior to all testing procedures. I conducted a clinical interview and neuropsychological testing (at least two tests) with [name] and [name], B.S. (Technician) administered additional test procedures. The patient was able to tolerate the testing procedures and the patient (and/or family if applicable) is likely to benefit from further follow up to receive the diagnosis and treatment recommendations, which will be rendered at the next encounter. Billing below reflects technician time, my direct face-to-face time with the patient, time spent in test administration, and time spent in professional activities including but not limited to: neuropsychological test interpretation, integration of neuropsychological test data with clinical history, report preparation, treatment planning, care coordination, and review of diagnostically pertinent medical history or studies.   Services associated with this encounter: Clinical Interview [phone]) plus 60 minutes (13086; Neuropsychological Evaluation by Professional)  100 minutes (57846; Neuropsychological Evaluation by Professional, Adl.) 25 minutes (96295; Test Administration by Professional) 30 minutes (28413; Neuropsychological Testing by Technician) 45 minutes (24401; Neuropsychological Testing by Technician, Adl.)

## 2020-10-28 NOTE — Progress Notes (Signed)
     [facility]  Patient Name: [name] MRN: [id number] Date of Birth: 01-13-1931 Age: 87 y.o. Education: 14 years  Measurement properties of test scores: IQ, Index, and Standard Scores (SS): Mean = 100; Standard Deviation = 15 Scaled Scores (Ss): Mean = 10; Standard Deviation = 3 Z scores (Z): Mean = 0; Standard Deviation = 1 T scores (T); Mean = 50; Standard Deviation = 10  TEST SCORES:    Note: This summary of test scores accompanies the interpretive report and should not be interpreted by unqualified individuals or in isolation without reference to the report. Test scores are relative to age, gender, and educational history as available and appropriate.   Expected Functioning        Wide Range Achievement Test (Word Reading): Standard/Scaled Score Percentile       Word Reading 99 47      Mental Status Testing         Total Score Descriptor  MMSE 21 Mild Dementia      Cognitive Testing        RBANS, Form : Standard/Scaled Score Percentile  Total Score 57 <1  Immediate Memory 69 2      List Learning 5 5      Story Memory 4 2  Visuospatial/Constructional 56 <1      Figure Copy   (11) 2 <1      Judgment of Line Orientation   (5) --- <2  Language 86 18      Picture Naming --- 26-50      Semantic Fluency 4 2  Attention 72 3      Digit Span 10 50      Coding 1 <1  Delayed Memory 48 <1      List Recall   (0) --- <2      List Recognition   (13) --- <2      Story Recall   (2) 4 2      Figure Recall   (2) 3 1      Neuropsychological Assessment Battery (Language Module): T-score Percentile      Naming   (21) 21 <1      Verbal Fluency: T-score Percentile      Controlled Oral Word Association (F-A-S) 30 2      Semantic Fluency (Animals) 18 <1      Trail Making Test: T-Score Percentile      Part A 25 1      Part B 22 <1      [place] Diagnostic Aphasia Exam: Raw Score Scaled Score      Complex Ideational Material 11 9       Clock Drawing Raw Score Descriptor      Command 4 Moderate Impairment      Rating Scales        Clinical Dementia Rating Raw Score Descriptor      Sum of Boxes 10.0 Moderate Dementia      Global Score 2.0 Moderate Dementia       Raw Score Descriptor  Geriatric Depression Scale - Short Form 0 Negative   Mishel Sans V. Nicole Kindred PsyD, [place] Clinical Neuropsychologist

## 2020-10-28 NOTE — Progress Notes (Signed)
[facility]  Patient Name: MAYRA BRAHM MRN: [id number] Date of Birth: 1930-11-25 Age: 87 y.o. Education: 55 years  Clinical Impressions  GOKUL WAYBRIGHT is a 87 y.o., right-hand dominant, widowed man with an unclear history of early changes although his neighbor of 20 years noticed he was extremely apathetic "sitting there for days" as far back as two years ago. His daughters were not particularly involved in his life until approximately two years ago but have noticed changes over that time. He was not acting like himself and started developing fixations around caregivers and demonstrating poor judgment (wanted to take [place] PT therapist to [place] large amounts of money). He decided to sell his house and then impulsively wanted to change to price even though he ended up eventually switching it back to the initial listing, he was also largely unable to engage in any aspects of the transaction and left it to his daughters. His daughters stepped in around that time, took over his finances, and he stopped driving. 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 His daughter describes him as "totally delusional" half the time, conflating things and not making much sense with disorganized thought processes. He has a CT head from 08/26/2020 that shows mild volume loss with consideration of just a bit more in the frontal-parietal areas.   On neuropsychological testing, Mr. Melland demonstrated extremely low overall cognitive performance, having the most difficulty on measures of delayed memory, visuospatial function, and processing speed. He has findings that tend to be relatively specific for Alzheimer's including diminished semantic as compared to phonemic fluency and deficient visual object confrontation naming. He had difficulty on most executive measures. A relative  strength was noted on a measure of simple auditory attention and he also did well on the Complex Ideational Material, which is somewhat surprising because this measure has a reasoning component and his thought process was clearly altered. Qualitatively, he had very prominent executive issues with stickiness and some stimulus bound behavior. He has poor insight, does not think anything is wrong, and denied all depressive symptoms. I was able to rate a CDR for him and would place his current functioning at an early moderate dementia level.   Mr. Cush is thus manifesting a dementia level problem. While some of this may be related to numerous cognitively impairing medications (on a standing 0.5mg  BID dose of lorazepam, 0.5mg  lorazapam every 6 hours PRN, and risperidal 0.5mg  BID) and his recent encephalopathy, I find those unlikely sole causes. What seems most likely is that Mr. Mankins had some level of cognitive changes that went unnoticed and he then converted to dementia following his recent hospital stay. Atypical Alzheimer's with prominent psychiatric symptoms further aggravated by encephalopathy and brain impairing medications seems most likely. Other degenerative conditions are in the differential but viewed as much less likely given his test data and base rates. He has clearly lost the ability to make medical and financial decisions of high risk and if needed, consulting with an elder law attorney about guardianship would be appropriate. I think memory care is an appropriate setting for him at this point in time. I will counsel his family on behavioral management strategies, although it sounds as though he is under better control since transitioning his care setting.   Diagnostic Impressions: Alzheimer's disease, late onset, with behavioral disturbance History of encephalopathy Medication side effects  Recommendations to be discussed with patient  Your performance and presentation on assessment today  were consistent with significant cognitive problems in several areas. You had the most difficulty on measures of delayed recall, visual functioning, and processing speed. Your basic attention was an area of relative strength. You had low scores on some diagnostically pertinent tests including measures of naming and semantic fluency. Given that you are no longer able to maintain independence, I think that dementia is the best term for your current problem.   Dementia refers to a group of syndromes where multiple areas of ability are damaged in the brain, such as memory, thinking, judgment, and behavior, and most commonly refers to age related causes of dementia that cause worsening in these abilities over time. Alzheimer's disease is the most common form of dementia in people over the age of 85. Not all dementias are Alzheimer's disease, but all Alzheimer's disease is dementia. When dementia is due to an underlying condition affecting the brain, such as Alzheimer's disease, there is progression over time, which typically proceeds gradually over many years.   In your case, I think that your dementia is likely driven by Alzheimer's disease, which is the most common cause of age-related cognitive decline, although there are certainly other contributing factors. Dementia is a major risk factor for altered mental status, and typically dementia worsens after an episode of altered mental status, which sounds like what happened in December, 2021. You are also on numerous brain impairing medications that may be contributing (Lorazepam, Risperidone).   Dementia is (somewhat arbitrarily) divided up into three different stages: Mild, Moderate, and Severe. These stages are not so much discrete stages as they are points on a continuum of severity. These stages govern the types of behavior you can expect, the level of care someone needs, and necessary supports. In the mild stages, difficulties are typically limited to  cognitive problems, such as forgetfulness or difficulties coming up with the right word. Individuals are often able to remain at home, safely operate a motor vehicle, and changes may not be obvious to the casual observer. They may have difficulties with more complex things such as managing finances, with driving, and with work if they are still working. These individuals are often capable of living independently with minimal assistance, but do require some supervision with more complex tasks such as managing finances, arranging and remembering to take medications, and reporting symptoms at doctors appointments. In the moderate stage, individuals with dementia eventually begin to have increasing difficulties with more basic activities (e.g., light housework, assembling a balanced meal, caring for personal hygiene and appearance). Individuals in the moderate stages also sometimes develop delusions (I.e., false beliefs), such as thinking that they have been visited by family members who are deceased, that their house is not their home, and the like. Individuals at this level of progression are not safe to operate a motor vehicle, should not be significantly involved in financial or medical decisions other than expressing their preferences, and should not be left alone for extended periods of time. They should not be left alone at all if there are problematic behaviors such as wandering. Individuals in the severe stage require extensive care and assistance, such as around the clock supervision or a skilled nursing level facility.   In your case, I think that your dementia is moderate in severity at the present time, although some improvement from that level is likely possible given further time out from your episode of altered mental status and  possibly also with medication changes. I would like to highlight that sometimes, cognitively impairing medications are necessary, and it is up to you to discuss that with your  prescribing providers because I cannot provide direct advice regarding medications.  Often times, individuals with dementia are the last to be aware of their problems. The medical term for this lack of insight is called "anosognosia," which means absence of knowledge of illness. While their failure to recognize what seems obvious to everyone else can be frustrating, it is not their fault and is part of the disease itself. Trying to point out problems to the unaware individual is usually not helpful and is frequently harmful. What is helpful is to provide reassurance, redirect them, and avoid confronting them directly with the cognitive difficulties they are experiencing. It is particularly important for individuals with anosognosia to get help planning for medical and other needs because they often are unaware of what their needs are.   In my professional judgment, you have lost the capacity to independently make medical and financial decisions of high risk. It would be reasonable for your family to consult with an elder law attorney regarding guardianship if that is needed. Guardianship is a legal determination of competency that allows one to get help if they are no longer able to manage their own affairs in their own best interest.   Oftentimes common sense, patience, and knowledge are the best tools for managing problematic behaviors in dementia. Every individual responds differently, but there are some general recommendations that may be helpful.  . Provide assistance as needed with memory-dependent or other cognitively challenging activities (e.g., doctors appointments, medications, planning trips, complex chores, paperwork or other forms).  . Use simple, clear words that are easy to understand. Simplify information, be clear and concise.  Marland Kitchen Provide reassurance rather than trying to reason or give explanations.  . Suggest the word that the person may be looking for.  Marland Kitchen Help simplify steps of  activities in daily life, such as writing down steps of a task. Eliminate tasks that are difficult and frustrating.  . Give instructions one step at a time.  . Consistency, praise, and encouragement are very important; do not use criticism or punishment.  . Agitated or catastrophic reactions are often a reaction to something in the environment or task difficulty/failure; know functional limits; when noticing the person getting upset, help redirect them away from the task and/or move to a different environment.   I recommend behavioral strategies for dealing with the behavioral and psychological issues that can accompany dementia. Things like agitation, wandering, and anxiety can often be improved or eliminated using the "three R's." Redirection (help distract your loved one by focusing their attention on something else, moving them to a new environment, or otherwise engaging them in something other than what is distressing to them), Reassurance (reassure them that you are there to take care of them and that there is nothing they need to be worried about), and Reconsidering (consider the situation from their perspective and try to identify if there is something about the situation or environment that may be triggering their reaction).  I will also provide you with a handout of "Understanding Behavioral Changes in Dementia" from the [employer].   Test Findings  Test scores are summarized in additional documentation associated with this encounter. Test scores are relative to age, gender, and educational history as available and appropriate. There were no concerns about performance validity as Mr. Saraceni seemed to be  well engaged in all cognitive tasks. His cognitive impairment is such that validity measures are unlikely to be accurate measures of performance validity at this point and accordingly, no standalone measures were administered..   General Intellectual Functioning/Achievement:   Performance on single word reading was average, which presents as a reasonable standard of comparison for this patient's cognitive test data.   Attention and Processing Efficiency: Performance on an indicator of simple auditory attention was good, with digit repetition in the average range. By contrast, his serial subtraction of 7's was 3/5 and his spelling of the word "World" backward was 2/5.   On timed indicators of processing speed, Mr. Eltzroth performed at an extremely low level. This includes his performance both on timed number-symbol coding and on simple numeric sequencing.   Language: Performance on language measures was generally low, with extremely low visual object confrontation naming. He got a fair number of these words with phonemic cuing so I think this reflects a bona-fide naming problem and not limitation in premorbid knowledge. Generation of words was unusually low in response to the letter prompts F-A-S and extremely low in response to the semantic prompts "animals" and "fruits and vegetables."   Visuospatial Function: Visuospatial and constructional functioning were extremely low, representing one of Mr. Hu poorest areas of performance. His figure copy was distorted and did not entirely lose the gestalt but was very inaccurate, with poor line quality due to tremor on graphomotor testing. His judgment of angular line orientations was also extremely low. Copy of the overlapping pentagons was poor and perseverative, he drew a fractal-like pattern of multiple pentagons but did not make them overlap.   Learning and Memory: Performance on measures of learning and memory showed difficulties suggestive of memory storage problems. He was able to retain a minimal amount of information over time and presents with perhaps relatively less in the way of amnestic memory problems than some individuals at at his level of function.   In the verbal realm, immediate recall was 1, 5, 4, and 6  words across four learning trials of a 10 item word list, which is a flat learning curve and an unusually low performance. He did not recall any words following a standard delay and performed near chance levels on recognition testing for the words vs. false choices. Immediate recall for a short story was extremely low and he did retain just a bit of that information but delayed recall was still extremely low.   In the visual realm, delayed recall of a modestly complex geometric figure was extremely low (remembered the wrong figure from the MMSE, suggesting source memory problems).   Executive Functions: Mr. Dibello had difficulties on most measures of executive abilities with clock drawing falling at a "moderately impaired" level, with missing numbers, sloppy arrangement of numbers, and no hands despite being prompted to set them and reminded of the correct time. He did not complete alternating sequencing of numbers and letters in the allotted time, generating an extremely low score. His generation of words in response to the letters F-A-S was unusually low and he engaged in that in a perseverative manner. Qualitatively, stimulus bound behavior was also observed on copy of the overlapping pentagons.   Rating Scale(s): Mr. Prout screened negative for the presence of depression. I was able to rate a CDR for him and his performance status is in the moderate dementia range, although that may be an overestimate of his enduring level of function given his medications and the fact he  was just in the hospital two months ago.   Viviano Simas Nicole Kindred PsyD, [facility]

## 2020-10-29 DIAGNOSIS — R41841 Cognitive communication deficit: Secondary | ICD-10-CM | POA: Diagnosis not present

## 2020-10-29 DIAGNOSIS — R1312 Dysphagia, oropharyngeal phase: Secondary | ICD-10-CM | POA: Diagnosis not present

## 2020-10-29 DIAGNOSIS — R488 Other symbolic dysfunctions: Secondary | ICD-10-CM | POA: Diagnosis not present

## 2020-11-01 DIAGNOSIS — R262 Difficulty in walking, not elsewhere classified: Secondary | ICD-10-CM | POA: Diagnosis not present

## 2020-11-01 DIAGNOSIS — R1312 Dysphagia, oropharyngeal phase: Secondary | ICD-10-CM | POA: Diagnosis not present

## 2020-11-01 DIAGNOSIS — R488 Other symbolic dysfunctions: Secondary | ICD-10-CM | POA: Diagnosis not present

## 2020-11-01 DIAGNOSIS — M6281 Muscle weakness (generalized): Secondary | ICD-10-CM | POA: Diagnosis not present

## 2020-11-01 DIAGNOSIS — R2689 Other abnormalities of gait and mobility: Secondary | ICD-10-CM | POA: Diagnosis not present

## 2020-11-01 DIAGNOSIS — R41841 Cognitive communication deficit: Secondary | ICD-10-CM | POA: Diagnosis not present

## 2020-11-02 ENCOUNTER — Other Ambulatory Visit: Payer: Self-pay

## 2020-11-02 ENCOUNTER — Ambulatory Visit (INDEPENDENT_AMBULATORY_CARE_PROVIDER_SITE_OTHER): Payer: Medicare Other | Admitting: Counselor

## 2020-11-02 ENCOUNTER — Encounter: Payer: Self-pay | Admitting: Counselor

## 2020-11-02 DIAGNOSIS — R41841 Cognitive communication deficit: Secondary | ICD-10-CM | POA: Diagnosis not present

## 2020-11-02 DIAGNOSIS — R2689 Other abnormalities of gait and mobility: Secondary | ICD-10-CM | POA: Diagnosis not present

## 2020-11-02 DIAGNOSIS — R488 Other symbolic dysfunctions: Secondary | ICD-10-CM | POA: Diagnosis not present

## 2020-11-02 DIAGNOSIS — F0281 Dementia in other diseases classified elsewhere with behavioral disturbance: Secondary | ICD-10-CM | POA: Diagnosis not present

## 2020-11-02 DIAGNOSIS — R262 Difficulty in walking, not elsewhere classified: Secondary | ICD-10-CM | POA: Diagnosis not present

## 2020-11-02 DIAGNOSIS — M6281 Muscle weakness (generalized): Secondary | ICD-10-CM | POA: Diagnosis not present

## 2020-11-02 DIAGNOSIS — R1312 Dysphagia, oropharyngeal phase: Secondary | ICD-10-CM | POA: Diagnosis not present

## 2020-11-02 DIAGNOSIS — G301 Alzheimer's disease with late onset: Secondary | ICD-10-CM

## 2020-11-02 DIAGNOSIS — F02818 Dementia in other diseases classified elsewhere, unspecified severity, with other behavioral disturbance: Secondary | ICD-10-CM

## 2020-11-02 NOTE — Patient Instructions (Signed)
Your performance and presentation on assessment today were consistent with significant cognitive problems in several areas. You had the most difficulty on measures of delayed recall, visual functioning, and processing speed. Your basic attention was an area of relative strength. You had low scores on some diagnostically pertinent tests including measures of naming and semantic fluency. Given that you are no longer able to maintain independence, I think that dementia is the best term for your current problem.   Dementia refers to a group of syndromes where multiple areas of ability are damaged in the brain, such as memory, thinking, judgment, and behavior, and most commonly refers to age related causes of dementia that cause worsening in these abilities over time. Alzheimer's disease is the most common form of dementia in people over the age of 73. Not all dementias are Alzheimer's disease, but all Alzheimer's disease is dementia. When dementia is due to an underlying condition affecting the brain, such as Alzheimer's disease, there is progression over time, which typically proceeds gradually over many years.   In your case, I think that your dementia is likely driven by Alzheimer's disease, which is the most common cause of age-related cognitive decline, although there are certainly other contributing factors. Dementia is a major risk factor for altered mental status, and typically dementia worsens after an episode of altered mental status, which sounds like what happened in December, 2021. You are also on numerous brain impairing medications that may be contributing (Lorazepam, Risperidone).   Dementia is (somewhat arbitrarily) divided up into three different stages: Mild, Moderate, and Severe. These stages are not so much discrete stages as they are points on a continuum of severity. These stages govern the types of behavior you can expect, the level of care someone needs, and necessary supports. In the  mild stages, difficulties are typically limited to cognitive problems, such as forgetfulness or difficulties coming up with the right word. Individuals are often able to remain at home, safely operate a motor vehicle, and changes may not be obvious to the casual observer. They may have difficulties with more complex things such as managing finances, with driving, and with work if they are still working. These individuals are often capable of living independently with minimal assistance, but do require some supervision with more complex tasks such as managing finances, arranging and remembering to take medications, and reporting symptoms at doctors appointments. In the moderate stage, individuals with dementia eventually begin to have increasing difficulties with more basic activities (e.g., light housework, assembling a balanced meal, caring for personal hygiene and appearance). Individuals in the moderate stages also sometimes develop delusions (I.e., false beliefs), such as thinking that they have been visited by family members who are deceased, that their house is not their home, and the like. Individuals at this level of progression are not safe to operate a motor vehicle, should not be significantly involved in financial or medical decisions other than expressing their preferences, and should not be left alone for extended periods of time. They should not be left alone at all if there are problematic behaviors such as wandering. Individuals in the severe stage require extensive care and assistance, such as around the clock supervision or a skilled nursing level facility.   In your case, I think that your dementia is moderate in severity at the present time, although some improvement from that level is likely possible given further time out from your episode of altered mental status and possibly also with medication changes. I would like  to highlight that sometimes, cognitively impairing medications are  necessary, and it is up to you to discuss that with your prescribing providers because I cannot provide direct advice regarding medications.  Often times, individuals with dementia are the last to be aware of their problems. The medical term for this lack of insight is called "anosognosia," which means absence of knowledge of illness. While their failure to recognize what seems obvious to everyone else can be frustrating, it is not their fault and is part of the disease itself. Trying to point out problems to the unaware individual is usually not helpful and is frequently harmful. What is helpful is to provide reassurance, redirect them, and avoid confronting them directly with the cognitive difficulties they are experiencing. It is particularly important for individuals with anosognosia to get help planning for medical and other needs because they often are unaware of what their needs are.   In my professional judgment, you have lost the capacity to independently make medical and financial decisions of high risk. It would be reasonable for your family to consult with an elder law attorney regarding guardianship if that is needed. Guardianship is a legal determination of competency that allows one to get help if they are no longer able to manage their own affairs in their own best interest.   Oftentimes common sense, patience, and knowledge are the best tools for managing problematic behaviors in dementia. Every individual responds differently, but there are some general recommendations that may be helpful.   Provide assistance as needed with memory-dependent or other cognitively challenging activities (e.g., doctors appointments, medications, planning trips, complex chores, paperwork or other forms).   Use simple, clear words that are easy to understand. Simplify information, be clear and concise.   Provide reassurance rather than trying to reason or give explanations.   Suggest the word that the  person may be looking for.   Help simplify steps of activities in daily life, such as writing down steps of a task. Eliminate tasks that are difficult and frustrating.   Give instructions one step at a time.   Consistency, praise, and encouragement are very important; do not use criticism or punishment.   Agitated or catastrophic reactions are often a reaction to something in the environment or task difficulty/failure; know functional limits; when noticing the person getting upset, help redirect them away from the task and/or move to a different environment.   I recommend behavioral strategies for dealing with the behavioral and psychological issues that can accompany dementia. Things like agitation, wandering, and anxiety can often be improved or eliminated using the "three R's." Redirection (help distract your loved one by focusing their attention on something else, moving them to a new environment, or otherwise engaging them in something other than what is distressing to them), Reassurance (reassure them that you are there to take care of them and that there is nothing they need to be worried about), and Reconsidering (consider the situation from their perspective and try to identify if there is something about the situation or environment that may be triggering their reaction).  I will also provide you with a handout of "Understanding Behavioral Changes in Dementia" from the [employer].

## 2020-11-02 NOTE — Progress Notes (Signed)
NEUROPSYCHOLOGY FEEDBACK NOTE [facility]  Feedback Note: I met with [name]'s daughters and POAs [name] and [name] (daughters) to review the findings resulting from his neuropsychological evaluation. They decided it was too much for him to attend the appointment, and I concur that likely would have made it more difficult to review the necessary content and may have resulted in agitation. We discussed impression of early moderate stage dementa, and I reviewed potential etiologic considerations and support needed at this phase of illness. I am optimistic that with the passing of time, relative stability in living circumstances, and perhaps medication deescalation as directed by his PCP, he may be able to recognize significant improvement. We discussed neuropsychiatric symptoms and ways of managing them, as reflected in the patient instructions. I took time to explain the findings and answer all the patient's questions. I encouraged [name] to contact me should he have any further questions or if further follow up is desired.   Current Medications and Medical History   Current Outpatient Medications  Medication Sig Dispense Refill  . amLODipine (NORVASC) 5 MG tablet Take 1 tablet by mouth once daily (Patient taking differently: Take 5 mg by mouth daily.) 90 tablet 0  . aspirin 81 MG tablet Take 81 mg by mouth daily.     . clopidogrel (PLAVIX) 75 MG tablet Take 1 tablet by mouth once daily (Patient taking differently: No sig reported) 90 tablet 1  . finasteride (PROSCAR) 5 MG tablet Take 5 mg by mouth daily.    Marland Kitchen GAMMAGARD 5 GM/50ML SOLN every 21 ( twenty-one) days.    Marland Kitchen NITROSTAT 0.4 MG SL tablet DISSOLVE ONE TABLET UNDER THE TONGUE EVERY 5 MINUTES AS NEEDED FOR CHEST PAIN.  DO NOT EXCEED A TOTAL OF 3 DOSES IN 15 MINUTES (Patient taking differently: Place 0.4 mg under the tongue every 5 (five) minutes x 3 doses as needed for chest pain.) 75 tablet 2  . pantoprazole (PROTONIX) 40 MG  tablet Take 1 tablet (40 mg total) by mouth daily. (Patient taking differently: Take 40 mg by mouth daily before breakfast.) 90 tablet 1  . predniSONE (DELTASONE) 20 MG tablet Take 1 tablet (20 mg total) by mouth daily with breakfast. 180 tablet 2  . pyridostigmine (MESTINON) 60 MG tablet Take 1 tablet at 7am, 10am 1pm, and 5pm (Patient taking differently: Take 60 mg by mouth See admin instructions. Take 60 mg by mouth at 7 AM, 10 AM, 1 PM, and 5 PM) 480 tablet 3  . QUEtiapine (SEROQUEL) 25 MG tablet Take 1 tablet (25 mg total) by mouth at bedtime as needed. 30 tablet 0  . spironolactone (ALDACTONE) 25 MG tablet Take 0.5 tablets (12.5 mg total) by mouth daily. 45 tablet 1   No current facility-administered medications for this visit.    Patient Active Problem List   Diagnosis Date Noted  . Late onset Alzheimer's disease with behavioral disturbance ([place]) 11/02/2020  . Benign prostatic hyperplasia with urinary frequency 08/27/2020  . Myasthenia gravis ([place]) 08/27/2020  . AMS (altered mental status) 08/27/2020  . Toxic metabolic encephalopathy 51/88/4166  . Stroke-like symptom 05/12/2020  . Leukocytosis 05/12/2020  . Macrocytosis 05/12/2020  . Stroke-like symptoms 05/12/2020  . Closed fracture of right distal humerus 07/03/2019  . Carotid artery disease ([place]) 09/09/2018  . Pacemaker-St.Jude 07/03/2012  . Bruit 09/28/2011  . Tongue lesion 08/15/2011  . Essential hypertension 06/23/2010  . Mixed hyperlipidemia 09/22/2008  . Coronary artery disease involving native coronary artery of native heart without  angina pectoris 09/22/2008  . Edema 09/22/2008  . SSS (sick sinus syndrome) ([place]) 09/22/2008    Mental Status and Behavioral Observations  Mental status not obtained due to family therapy without patient present.   Plan  Feedback provided regarding the patient's neuropsychological evaluation. He did not participate, I spoke with his daughters and POAs [name] and [name]. While he had  previously given his assent for me to speak with them, it is also my opinion that he has lost the ability to independently make medical and financial decisions of high risk, and thus it was appropriate to consult with his POAs. They will follow up with his PCP. He is also under the care of Dr. Posey Pronto for his Myasthenia Gravis. [name] was encouraged to contact me if any questions arise or if further follow up is desired.   Viviano Simas Nicole Kindred, PsyD, ABN Clinical Neuropsychologist  Service(s) Provided at This Encounter: 48 minutes [phone]; Conjoint therapy without patient present)

## 2020-11-03 DIAGNOSIS — M6281 Muscle weakness (generalized): Secondary | ICD-10-CM | POA: Diagnosis not present

## 2020-11-03 DIAGNOSIS — R262 Difficulty in walking, not elsewhere classified: Secondary | ICD-10-CM | POA: Diagnosis not present

## 2020-11-03 DIAGNOSIS — R488 Other symbolic dysfunctions: Secondary | ICD-10-CM | POA: Diagnosis not present

## 2020-11-03 DIAGNOSIS — R2689 Other abnormalities of gait and mobility: Secondary | ICD-10-CM | POA: Diagnosis not present

## 2020-11-03 DIAGNOSIS — R1312 Dysphagia, oropharyngeal phase: Secondary | ICD-10-CM | POA: Diagnosis not present

## 2020-11-03 DIAGNOSIS — R41841 Cognitive communication deficit: Secondary | ICD-10-CM | POA: Diagnosis not present

## 2020-11-04 DIAGNOSIS — R41841 Cognitive communication deficit: Secondary | ICD-10-CM | POA: Diagnosis not present

## 2020-11-04 DIAGNOSIS — R262 Difficulty in walking, not elsewhere classified: Secondary | ICD-10-CM | POA: Diagnosis not present

## 2020-11-04 DIAGNOSIS — R488 Other symbolic dysfunctions: Secondary | ICD-10-CM | POA: Diagnosis not present

## 2020-11-04 DIAGNOSIS — R1312 Dysphagia, oropharyngeal phase: Secondary | ICD-10-CM | POA: Diagnosis not present

## 2020-11-04 DIAGNOSIS — R2689 Other abnormalities of gait and mobility: Secondary | ICD-10-CM | POA: Diagnosis not present

## 2020-11-04 DIAGNOSIS — M6281 Muscle weakness (generalized): Secondary | ICD-10-CM | POA: Diagnosis not present

## 2020-11-05 DIAGNOSIS — R488 Other symbolic dysfunctions: Secondary | ICD-10-CM | POA: Diagnosis not present

## 2020-11-05 DIAGNOSIS — R41841 Cognitive communication deficit: Secondary | ICD-10-CM | POA: Diagnosis not present

## 2020-11-05 DIAGNOSIS — R262 Difficulty in walking, not elsewhere classified: Secondary | ICD-10-CM | POA: Diagnosis not present

## 2020-11-05 DIAGNOSIS — R2689 Other abnormalities of gait and mobility: Secondary | ICD-10-CM | POA: Diagnosis not present

## 2020-11-05 DIAGNOSIS — R1312 Dysphagia, oropharyngeal phase: Secondary | ICD-10-CM | POA: Diagnosis not present

## 2020-11-05 DIAGNOSIS — M6281 Muscle weakness (generalized): Secondary | ICD-10-CM | POA: Diagnosis not present

## 2020-11-08 DIAGNOSIS — M6281 Muscle weakness (generalized): Secondary | ICD-10-CM | POA: Diagnosis not present

## 2020-11-08 DIAGNOSIS — R2689 Other abnormalities of gait and mobility: Secondary | ICD-10-CM | POA: Diagnosis not present

## 2020-11-08 DIAGNOSIS — R1312 Dysphagia, oropharyngeal phase: Secondary | ICD-10-CM | POA: Diagnosis not present

## 2020-11-08 DIAGNOSIS — R488 Other symbolic dysfunctions: Secondary | ICD-10-CM | POA: Diagnosis not present

## 2020-11-08 DIAGNOSIS — R262 Difficulty in walking, not elsewhere classified: Secondary | ICD-10-CM | POA: Diagnosis not present

## 2020-11-08 DIAGNOSIS — R41841 Cognitive communication deficit: Secondary | ICD-10-CM | POA: Diagnosis not present

## 2020-11-09 DIAGNOSIS — R2689 Other abnormalities of gait and mobility: Secondary | ICD-10-CM | POA: Diagnosis not present

## 2020-11-09 DIAGNOSIS — R488 Other symbolic dysfunctions: Secondary | ICD-10-CM | POA: Diagnosis not present

## 2020-11-09 DIAGNOSIS — R262 Difficulty in walking, not elsewhere classified: Secondary | ICD-10-CM | POA: Diagnosis not present

## 2020-11-09 DIAGNOSIS — R1312 Dysphagia, oropharyngeal phase: Secondary | ICD-10-CM | POA: Diagnosis not present

## 2020-11-09 DIAGNOSIS — M6281 Muscle weakness (generalized): Secondary | ICD-10-CM | POA: Diagnosis not present

## 2020-11-09 DIAGNOSIS — R41841 Cognitive communication deficit: Secondary | ICD-10-CM | POA: Diagnosis not present

## 2020-11-10 DIAGNOSIS — R41841 Cognitive communication deficit: Secondary | ICD-10-CM | POA: Diagnosis not present

## 2020-11-10 DIAGNOSIS — R1312 Dysphagia, oropharyngeal phase: Secondary | ICD-10-CM | POA: Diagnosis not present

## 2020-11-10 DIAGNOSIS — M6281 Muscle weakness (generalized): Secondary | ICD-10-CM | POA: Diagnosis not present

## 2020-11-10 DIAGNOSIS — R262 Difficulty in walking, not elsewhere classified: Secondary | ICD-10-CM | POA: Diagnosis not present

## 2020-11-10 DIAGNOSIS — R2689 Other abnormalities of gait and mobility: Secondary | ICD-10-CM | POA: Diagnosis not present

## 2020-11-10 DIAGNOSIS — R488 Other symbolic dysfunctions: Secondary | ICD-10-CM | POA: Diagnosis not present

## 2020-11-11 DIAGNOSIS — R41841 Cognitive communication deficit: Secondary | ICD-10-CM | POA: Diagnosis not present

## 2020-11-11 DIAGNOSIS — E7800 Pure hypercholesterolemia, unspecified: Secondary | ICD-10-CM | POA: Diagnosis not present

## 2020-11-11 DIAGNOSIS — M6281 Muscle weakness (generalized): Secondary | ICD-10-CM | POA: Diagnosis not present

## 2020-11-11 DIAGNOSIS — R1312 Dysphagia, oropharyngeal phase: Secondary | ICD-10-CM | POA: Diagnosis not present

## 2020-11-11 DIAGNOSIS — G4700 Insomnia, unspecified: Secondary | ICD-10-CM | POA: Diagnosis not present

## 2020-11-11 DIAGNOSIS — R2689 Other abnormalities of gait and mobility: Secondary | ICD-10-CM | POA: Diagnosis not present

## 2020-11-11 DIAGNOSIS — I10 Essential (primary) hypertension: Secondary | ICD-10-CM | POA: Diagnosis not present

## 2020-11-11 DIAGNOSIS — R488 Other symbolic dysfunctions: Secondary | ICD-10-CM | POA: Diagnosis not present

## 2020-11-11 DIAGNOSIS — I2510 Atherosclerotic heart disease of native coronary artery without angina pectoris: Secondary | ICD-10-CM | POA: Diagnosis not present

## 2020-11-11 DIAGNOSIS — R262 Difficulty in walking, not elsewhere classified: Secondary | ICD-10-CM | POA: Diagnosis not present

## 2020-11-11 DIAGNOSIS — N400 Enlarged prostate without lower urinary tract symptoms: Secondary | ICD-10-CM | POA: Diagnosis not present

## 2020-11-12 DIAGNOSIS — R262 Difficulty in walking, not elsewhere classified: Secondary | ICD-10-CM | POA: Diagnosis not present

## 2020-11-12 DIAGNOSIS — R41841 Cognitive communication deficit: Secondary | ICD-10-CM | POA: Diagnosis not present

## 2020-11-12 DIAGNOSIS — R2689 Other abnormalities of gait and mobility: Secondary | ICD-10-CM | POA: Diagnosis not present

## 2020-11-12 DIAGNOSIS — R1312 Dysphagia, oropharyngeal phase: Secondary | ICD-10-CM | POA: Diagnosis not present

## 2020-11-12 DIAGNOSIS — M6281 Muscle weakness (generalized): Secondary | ICD-10-CM | POA: Diagnosis not present

## 2020-11-12 DIAGNOSIS — R488 Other symbolic dysfunctions: Secondary | ICD-10-CM | POA: Diagnosis not present

## 2020-11-15 DIAGNOSIS — R262 Difficulty in walking, not elsewhere classified: Secondary | ICD-10-CM | POA: Diagnosis not present

## 2020-11-15 DIAGNOSIS — R1312 Dysphagia, oropharyngeal phase: Secondary | ICD-10-CM | POA: Diagnosis not present

## 2020-11-15 DIAGNOSIS — R41841 Cognitive communication deficit: Secondary | ICD-10-CM | POA: Diagnosis not present

## 2020-11-15 DIAGNOSIS — R488 Other symbolic dysfunctions: Secondary | ICD-10-CM | POA: Diagnosis not present

## 2020-11-15 DIAGNOSIS — R2689 Other abnormalities of gait and mobility: Secondary | ICD-10-CM | POA: Diagnosis not present

## 2020-11-15 DIAGNOSIS — M6281 Muscle weakness (generalized): Secondary | ICD-10-CM | POA: Diagnosis not present

## 2020-11-16 DIAGNOSIS — R41841 Cognitive communication deficit: Secondary | ICD-10-CM | POA: Diagnosis not present

## 2020-11-16 DIAGNOSIS — R1312 Dysphagia, oropharyngeal phase: Secondary | ICD-10-CM | POA: Diagnosis not present

## 2020-11-16 DIAGNOSIS — R488 Other symbolic dysfunctions: Secondary | ICD-10-CM | POA: Diagnosis not present

## 2020-11-16 DIAGNOSIS — R262 Difficulty in walking, not elsewhere classified: Secondary | ICD-10-CM | POA: Diagnosis not present

## 2020-11-16 DIAGNOSIS — M6281 Muscle weakness (generalized): Secondary | ICD-10-CM | POA: Diagnosis not present

## 2020-11-16 DIAGNOSIS — R2689 Other abnormalities of gait and mobility: Secondary | ICD-10-CM | POA: Diagnosis not present

## 2020-11-17 DIAGNOSIS — R488 Other symbolic dysfunctions: Secondary | ICD-10-CM | POA: Diagnosis not present

## 2020-11-17 DIAGNOSIS — R1312 Dysphagia, oropharyngeal phase: Secondary | ICD-10-CM | POA: Diagnosis not present

## 2020-11-17 DIAGNOSIS — R41841 Cognitive communication deficit: Secondary | ICD-10-CM | POA: Diagnosis not present

## 2020-11-17 DIAGNOSIS — M6281 Muscle weakness (generalized): Secondary | ICD-10-CM | POA: Diagnosis not present

## 2020-11-17 DIAGNOSIS — R262 Difficulty in walking, not elsewhere classified: Secondary | ICD-10-CM | POA: Diagnosis not present

## 2020-11-17 DIAGNOSIS — R2689 Other abnormalities of gait and mobility: Secondary | ICD-10-CM | POA: Diagnosis not present

## 2020-11-18 DIAGNOSIS — R2689 Other abnormalities of gait and mobility: Secondary | ICD-10-CM | POA: Diagnosis not present

## 2020-11-18 DIAGNOSIS — R41841 Cognitive communication deficit: Secondary | ICD-10-CM | POA: Diagnosis not present

## 2020-11-18 DIAGNOSIS — R1312 Dysphagia, oropharyngeal phase: Secondary | ICD-10-CM | POA: Diagnosis not present

## 2020-11-18 DIAGNOSIS — M6281 Muscle weakness (generalized): Secondary | ICD-10-CM | POA: Diagnosis not present

## 2020-11-18 DIAGNOSIS — R488 Other symbolic dysfunctions: Secondary | ICD-10-CM | POA: Diagnosis not present

## 2020-11-18 DIAGNOSIS — R262 Difficulty in walking, not elsewhere classified: Secondary | ICD-10-CM | POA: Diagnosis not present

## 2020-11-19 DIAGNOSIS — R488 Other symbolic dysfunctions: Secondary | ICD-10-CM | POA: Diagnosis not present

## 2020-11-19 DIAGNOSIS — R2689 Other abnormalities of gait and mobility: Secondary | ICD-10-CM | POA: Diagnosis not present

## 2020-11-19 DIAGNOSIS — R41841 Cognitive communication deficit: Secondary | ICD-10-CM | POA: Diagnosis not present

## 2020-11-19 DIAGNOSIS — R262 Difficulty in walking, not elsewhere classified: Secondary | ICD-10-CM | POA: Diagnosis not present

## 2020-11-19 DIAGNOSIS — M6281 Muscle weakness (generalized): Secondary | ICD-10-CM | POA: Diagnosis not present

## 2020-11-19 DIAGNOSIS — R1312 Dysphagia, oropharyngeal phase: Secondary | ICD-10-CM | POA: Diagnosis not present

## 2020-11-22 DIAGNOSIS — R262 Difficulty in walking, not elsewhere classified: Secondary | ICD-10-CM | POA: Diagnosis not present

## 2020-11-22 DIAGNOSIS — M6281 Muscle weakness (generalized): Secondary | ICD-10-CM | POA: Diagnosis not present

## 2020-11-22 DIAGNOSIS — R488 Other symbolic dysfunctions: Secondary | ICD-10-CM | POA: Diagnosis not present

## 2020-11-22 DIAGNOSIS — R41841 Cognitive communication deficit: Secondary | ICD-10-CM | POA: Diagnosis not present

## 2020-11-22 DIAGNOSIS — R1312 Dysphagia, oropharyngeal phase: Secondary | ICD-10-CM | POA: Diagnosis not present

## 2020-11-22 DIAGNOSIS — R2689 Other abnormalities of gait and mobility: Secondary | ICD-10-CM | POA: Diagnosis not present

## 2020-11-24 DIAGNOSIS — R1312 Dysphagia, oropharyngeal phase: Secondary | ICD-10-CM | POA: Diagnosis not present

## 2020-11-24 DIAGNOSIS — R262 Difficulty in walking, not elsewhere classified: Secondary | ICD-10-CM | POA: Diagnosis not present

## 2020-11-24 DIAGNOSIS — R488 Other symbolic dysfunctions: Secondary | ICD-10-CM | POA: Diagnosis not present

## 2020-11-24 DIAGNOSIS — R41841 Cognitive communication deficit: Secondary | ICD-10-CM | POA: Diagnosis not present

## 2020-11-24 DIAGNOSIS — M6281 Muscle weakness (generalized): Secondary | ICD-10-CM | POA: Diagnosis not present

## 2020-11-24 DIAGNOSIS — R2689 Other abnormalities of gait and mobility: Secondary | ICD-10-CM | POA: Diagnosis not present

## 2020-11-25 DIAGNOSIS — R41841 Cognitive communication deficit: Secondary | ICD-10-CM | POA: Diagnosis not present

## 2020-11-25 DIAGNOSIS — R2689 Other abnormalities of gait and mobility: Secondary | ICD-10-CM | POA: Diagnosis not present

## 2020-11-25 DIAGNOSIS — M6281 Muscle weakness (generalized): Secondary | ICD-10-CM | POA: Diagnosis not present

## 2020-11-25 DIAGNOSIS — R488 Other symbolic dysfunctions: Secondary | ICD-10-CM | POA: Diagnosis not present

## 2020-11-25 DIAGNOSIS — R262 Difficulty in walking, not elsewhere classified: Secondary | ICD-10-CM | POA: Diagnosis not present

## 2020-11-25 DIAGNOSIS — R1312 Dysphagia, oropharyngeal phase: Secondary | ICD-10-CM | POA: Diagnosis not present

## 2020-11-26 DIAGNOSIS — R262 Difficulty in walking, not elsewhere classified: Secondary | ICD-10-CM | POA: Diagnosis not present

## 2020-11-26 DIAGNOSIS — R1312 Dysphagia, oropharyngeal phase: Secondary | ICD-10-CM | POA: Diagnosis not present

## 2020-11-26 DIAGNOSIS — R488 Other symbolic dysfunctions: Secondary | ICD-10-CM | POA: Diagnosis not present

## 2020-11-26 DIAGNOSIS — R2689 Other abnormalities of gait and mobility: Secondary | ICD-10-CM | POA: Diagnosis not present

## 2020-11-26 DIAGNOSIS — R41841 Cognitive communication deficit: Secondary | ICD-10-CM | POA: Diagnosis not present

## 2020-11-26 DIAGNOSIS — M6281 Muscle weakness (generalized): Secondary | ICD-10-CM | POA: Diagnosis not present

## 2020-11-29 DIAGNOSIS — R2689 Other abnormalities of gait and mobility: Secondary | ICD-10-CM | POA: Diagnosis not present

## 2020-11-29 DIAGNOSIS — R1312 Dysphagia, oropharyngeal phase: Secondary | ICD-10-CM | POA: Diagnosis not present

## 2020-11-29 DIAGNOSIS — R488 Other symbolic dysfunctions: Secondary | ICD-10-CM | POA: Diagnosis not present

## 2020-11-29 DIAGNOSIS — Z20828 Contact with and (suspected) exposure to other viral communicable diseases: Secondary | ICD-10-CM | POA: Diagnosis not present

## 2020-11-29 DIAGNOSIS — M6281 Muscle weakness (generalized): Secondary | ICD-10-CM | POA: Diagnosis not present

## 2020-11-29 DIAGNOSIS — R262 Difficulty in walking, not elsewhere classified: Secondary | ICD-10-CM | POA: Diagnosis not present

## 2020-11-29 DIAGNOSIS — R41841 Cognitive communication deficit: Secondary | ICD-10-CM | POA: Diagnosis not present

## 2020-11-30 DIAGNOSIS — R1312 Dysphagia, oropharyngeal phase: Secondary | ICD-10-CM | POA: Diagnosis not present

## 2020-11-30 DIAGNOSIS — R488 Other symbolic dysfunctions: Secondary | ICD-10-CM | POA: Diagnosis not present

## 2020-11-30 DIAGNOSIS — R41841 Cognitive communication deficit: Secondary | ICD-10-CM | POA: Diagnosis not present

## 2020-11-30 DIAGNOSIS — M6281 Muscle weakness (generalized): Secondary | ICD-10-CM | POA: Diagnosis not present

## 2020-11-30 DIAGNOSIS — R262 Difficulty in walking, not elsewhere classified: Secondary | ICD-10-CM | POA: Diagnosis not present

## 2020-11-30 DIAGNOSIS — R2689 Other abnormalities of gait and mobility: Secondary | ICD-10-CM | POA: Diagnosis not present

## 2020-12-01 DIAGNOSIS — R262 Difficulty in walking, not elsewhere classified: Secondary | ICD-10-CM | POA: Diagnosis not present

## 2020-12-01 DIAGNOSIS — M6281 Muscle weakness (generalized): Secondary | ICD-10-CM | POA: Diagnosis not present

## 2020-12-01 DIAGNOSIS — R2689 Other abnormalities of gait and mobility: Secondary | ICD-10-CM | POA: Diagnosis not present

## 2020-12-01 DIAGNOSIS — R488 Other symbolic dysfunctions: Secondary | ICD-10-CM | POA: Diagnosis not present

## 2020-12-01 DIAGNOSIS — R1312 Dysphagia, oropharyngeal phase: Secondary | ICD-10-CM | POA: Diagnosis not present

## 2020-12-01 DIAGNOSIS — R41841 Cognitive communication deficit: Secondary | ICD-10-CM | POA: Diagnosis not present

## 2020-12-02 DIAGNOSIS — R262 Difficulty in walking, not elsewhere classified: Secondary | ICD-10-CM | POA: Diagnosis not present

## 2020-12-02 DIAGNOSIS — R41841 Cognitive communication deficit: Secondary | ICD-10-CM | POA: Diagnosis not present

## 2020-12-02 DIAGNOSIS — R1312 Dysphagia, oropharyngeal phase: Secondary | ICD-10-CM | POA: Diagnosis not present

## 2020-12-02 DIAGNOSIS — M6281 Muscle weakness (generalized): Secondary | ICD-10-CM | POA: Diagnosis not present

## 2020-12-02 DIAGNOSIS — R488 Other symbolic dysfunctions: Secondary | ICD-10-CM | POA: Diagnosis not present

## 2020-12-02 DIAGNOSIS — R2689 Other abnormalities of gait and mobility: Secondary | ICD-10-CM | POA: Diagnosis not present

## 2020-12-04 DIAGNOSIS — M6281 Muscle weakness (generalized): Secondary | ICD-10-CM | POA: Diagnosis not present

## 2020-12-04 DIAGNOSIS — R488 Other symbolic dysfunctions: Secondary | ICD-10-CM | POA: Diagnosis not present

## 2020-12-04 DIAGNOSIS — R41841 Cognitive communication deficit: Secondary | ICD-10-CM | POA: Diagnosis not present

## 2020-12-04 DIAGNOSIS — R262 Difficulty in walking, not elsewhere classified: Secondary | ICD-10-CM | POA: Diagnosis not present

## 2020-12-04 DIAGNOSIS — R2689 Other abnormalities of gait and mobility: Secondary | ICD-10-CM | POA: Diagnosis not present

## 2020-12-04 DIAGNOSIS — R1312 Dysphagia, oropharyngeal phase: Secondary | ICD-10-CM | POA: Diagnosis not present

## 2020-12-06 DIAGNOSIS — R488 Other symbolic dysfunctions: Secondary | ICD-10-CM | POA: Diagnosis not present

## 2020-12-06 DIAGNOSIS — Z20828 Contact with and (suspected) exposure to other viral communicable diseases: Secondary | ICD-10-CM | POA: Diagnosis not present

## 2020-12-06 DIAGNOSIS — R1312 Dysphagia, oropharyngeal phase: Secondary | ICD-10-CM | POA: Diagnosis not present

## 2020-12-06 DIAGNOSIS — M6281 Muscle weakness (generalized): Secondary | ICD-10-CM | POA: Diagnosis not present

## 2020-12-06 DIAGNOSIS — R2689 Other abnormalities of gait and mobility: Secondary | ICD-10-CM | POA: Diagnosis not present

## 2020-12-06 DIAGNOSIS — R41841 Cognitive communication deficit: Secondary | ICD-10-CM | POA: Diagnosis not present

## 2020-12-06 DIAGNOSIS — R262 Difficulty in walking, not elsewhere classified: Secondary | ICD-10-CM | POA: Diagnosis not present

## 2020-12-07 DIAGNOSIS — R488 Other symbolic dysfunctions: Secondary | ICD-10-CM | POA: Diagnosis not present

## 2020-12-07 DIAGNOSIS — R41841 Cognitive communication deficit: Secondary | ICD-10-CM | POA: Diagnosis not present

## 2020-12-07 DIAGNOSIS — R262 Difficulty in walking, not elsewhere classified: Secondary | ICD-10-CM | POA: Diagnosis not present

## 2020-12-07 DIAGNOSIS — R1312 Dysphagia, oropharyngeal phase: Secondary | ICD-10-CM | POA: Diagnosis not present

## 2020-12-07 DIAGNOSIS — M6281 Muscle weakness (generalized): Secondary | ICD-10-CM | POA: Diagnosis not present

## 2020-12-07 DIAGNOSIS — R2689 Other abnormalities of gait and mobility: Secondary | ICD-10-CM | POA: Diagnosis not present

## 2020-12-13 DIAGNOSIS — Z20828 Contact with and (suspected) exposure to other viral communicable diseases: Secondary | ICD-10-CM | POA: Diagnosis not present

## 2020-12-14 DIAGNOSIS — R2689 Other abnormalities of gait and mobility: Secondary | ICD-10-CM | POA: Diagnosis not present

## 2020-12-14 DIAGNOSIS — R1312 Dysphagia, oropharyngeal phase: Secondary | ICD-10-CM | POA: Diagnosis not present

## 2020-12-14 DIAGNOSIS — R488 Other symbolic dysfunctions: Secondary | ICD-10-CM | POA: Diagnosis not present

## 2020-12-14 DIAGNOSIS — M6281 Muscle weakness (generalized): Secondary | ICD-10-CM | POA: Diagnosis not present

## 2020-12-14 DIAGNOSIS — R41841 Cognitive communication deficit: Secondary | ICD-10-CM | POA: Diagnosis not present

## 2020-12-14 DIAGNOSIS — R262 Difficulty in walking, not elsewhere classified: Secondary | ICD-10-CM | POA: Diagnosis not present

## 2020-12-15 DIAGNOSIS — R2689 Other abnormalities of gait and mobility: Secondary | ICD-10-CM | POA: Diagnosis not present

## 2020-12-15 DIAGNOSIS — R262 Difficulty in walking, not elsewhere classified: Secondary | ICD-10-CM | POA: Diagnosis not present

## 2020-12-15 DIAGNOSIS — R41841 Cognitive communication deficit: Secondary | ICD-10-CM | POA: Diagnosis not present

## 2020-12-15 DIAGNOSIS — R488 Other symbolic dysfunctions: Secondary | ICD-10-CM | POA: Diagnosis not present

## 2020-12-15 DIAGNOSIS — M6281 Muscle weakness (generalized): Secondary | ICD-10-CM | POA: Diagnosis not present

## 2020-12-15 DIAGNOSIS — R1312 Dysphagia, oropharyngeal phase: Secondary | ICD-10-CM | POA: Diagnosis not present

## 2020-12-16 DIAGNOSIS — M6281 Muscle weakness (generalized): Secondary | ICD-10-CM | POA: Diagnosis not present

## 2020-12-16 DIAGNOSIS — R2689 Other abnormalities of gait and mobility: Secondary | ICD-10-CM | POA: Diagnosis not present

## 2020-12-16 DIAGNOSIS — R41841 Cognitive communication deficit: Secondary | ICD-10-CM | POA: Diagnosis not present

## 2020-12-16 DIAGNOSIS — R1312 Dysphagia, oropharyngeal phase: Secondary | ICD-10-CM | POA: Diagnosis not present

## 2020-12-16 DIAGNOSIS — R488 Other symbolic dysfunctions: Secondary | ICD-10-CM | POA: Diagnosis not present

## 2020-12-16 DIAGNOSIS — R262 Difficulty in walking, not elsewhere classified: Secondary | ICD-10-CM | POA: Diagnosis not present

## 2020-12-17 DIAGNOSIS — R1312 Dysphagia, oropharyngeal phase: Secondary | ICD-10-CM | POA: Diagnosis not present

## 2020-12-17 DIAGNOSIS — R262 Difficulty in walking, not elsewhere classified: Secondary | ICD-10-CM | POA: Diagnosis not present

## 2020-12-17 DIAGNOSIS — M6281 Muscle weakness (generalized): Secondary | ICD-10-CM | POA: Diagnosis not present

## 2020-12-17 DIAGNOSIS — J984 Other disorders of lung: Secondary | ICD-10-CM | POA: Diagnosis not present

## 2020-12-17 DIAGNOSIS — R488 Other symbolic dysfunctions: Secondary | ICD-10-CM | POA: Diagnosis not present

## 2020-12-17 DIAGNOSIS — R41841 Cognitive communication deficit: Secondary | ICD-10-CM | POA: Diagnosis not present

## 2020-12-17 DIAGNOSIS — R2689 Other abnormalities of gait and mobility: Secondary | ICD-10-CM | POA: Diagnosis not present

## 2020-12-20 ENCOUNTER — Encounter: Payer: Self-pay | Admitting: Neurology

## 2020-12-20 DIAGNOSIS — R1312 Dysphagia, oropharyngeal phase: Secondary | ICD-10-CM | POA: Diagnosis not present

## 2020-12-20 DIAGNOSIS — R262 Difficulty in walking, not elsewhere classified: Secondary | ICD-10-CM | POA: Diagnosis not present

## 2020-12-20 DIAGNOSIS — R2689 Other abnormalities of gait and mobility: Secondary | ICD-10-CM | POA: Diagnosis not present

## 2020-12-20 DIAGNOSIS — M6281 Muscle weakness (generalized): Secondary | ICD-10-CM | POA: Diagnosis not present

## 2020-12-20 DIAGNOSIS — R41841 Cognitive communication deficit: Secondary | ICD-10-CM | POA: Diagnosis not present

## 2020-12-20 DIAGNOSIS — R488 Other symbolic dysfunctions: Secondary | ICD-10-CM | POA: Diagnosis not present

## 2020-12-20 NOTE — Progress Notes (Signed)
Received fax approval valid from 12/15/20 to 03/17/21 for the gammagard medication Auth #: [id number].

## 2020-12-21 DIAGNOSIS — R1312 Dysphagia, oropharyngeal phase: Secondary | ICD-10-CM | POA: Diagnosis not present

## 2020-12-21 DIAGNOSIS — J189 Pneumonia, unspecified organism: Secondary | ICD-10-CM | POA: Diagnosis not present

## 2020-12-21 DIAGNOSIS — R41841 Cognitive communication deficit: Secondary | ICD-10-CM | POA: Diagnosis not present

## 2020-12-21 DIAGNOSIS — R488 Other symbolic dysfunctions: Secondary | ICD-10-CM | POA: Diagnosis not present

## 2020-12-21 DIAGNOSIS — M6281 Muscle weakness (generalized): Secondary | ICD-10-CM | POA: Diagnosis not present

## 2020-12-21 DIAGNOSIS — R2689 Other abnormalities of gait and mobility: Secondary | ICD-10-CM | POA: Diagnosis not present

## 2020-12-21 DIAGNOSIS — I517 Cardiomegaly: Secondary | ICD-10-CM | POA: Diagnosis not present

## 2020-12-21 DIAGNOSIS — R262 Difficulty in walking, not elsewhere classified: Secondary | ICD-10-CM | POA: Diagnosis not present

## 2020-12-22 DIAGNOSIS — B351 Tinea unguium: Secondary | ICD-10-CM | POA: Diagnosis not present

## 2020-12-22 DIAGNOSIS — R1312 Dysphagia, oropharyngeal phase: Secondary | ICD-10-CM | POA: Diagnosis not present

## 2020-12-22 DIAGNOSIS — R2689 Other abnormalities of gait and mobility: Secondary | ICD-10-CM | POA: Diagnosis not present

## 2020-12-22 DIAGNOSIS — R488 Other symbolic dysfunctions: Secondary | ICD-10-CM | POA: Diagnosis not present

## 2020-12-22 DIAGNOSIS — M79675 Pain in left toe(s): Secondary | ICD-10-CM | POA: Diagnosis not present

## 2020-12-22 DIAGNOSIS — R262 Difficulty in walking, not elsewhere classified: Secondary | ICD-10-CM | POA: Diagnosis not present

## 2020-12-22 DIAGNOSIS — M79674 Pain in right toe(s): Secondary | ICD-10-CM | POA: Diagnosis not present

## 2020-12-22 DIAGNOSIS — R41841 Cognitive communication deficit: Secondary | ICD-10-CM | POA: Diagnosis not present

## 2020-12-22 DIAGNOSIS — M6281 Muscle weakness (generalized): Secondary | ICD-10-CM | POA: Diagnosis not present

## 2020-12-23 DIAGNOSIS — R262 Difficulty in walking, not elsewhere classified: Secondary | ICD-10-CM | POA: Diagnosis not present

## 2020-12-23 DIAGNOSIS — R488 Other symbolic dysfunctions: Secondary | ICD-10-CM | POA: Diagnosis not present

## 2020-12-23 DIAGNOSIS — R2689 Other abnormalities of gait and mobility: Secondary | ICD-10-CM | POA: Diagnosis not present

## 2020-12-23 DIAGNOSIS — R41841 Cognitive communication deficit: Secondary | ICD-10-CM | POA: Diagnosis not present

## 2020-12-23 DIAGNOSIS — R1312 Dysphagia, oropharyngeal phase: Secondary | ICD-10-CM | POA: Diagnosis not present

## 2020-12-23 DIAGNOSIS — M6281 Muscle weakness (generalized): Secondary | ICD-10-CM | POA: Diagnosis not present

## 2020-12-24 DIAGNOSIS — R41841 Cognitive communication deficit: Secondary | ICD-10-CM | POA: Diagnosis not present

## 2020-12-24 DIAGNOSIS — R262 Difficulty in walking, not elsewhere classified: Secondary | ICD-10-CM | POA: Diagnosis not present

## 2020-12-24 DIAGNOSIS — R1312 Dysphagia, oropharyngeal phase: Secondary | ICD-10-CM | POA: Diagnosis not present

## 2020-12-24 DIAGNOSIS — M6281 Muscle weakness (generalized): Secondary | ICD-10-CM | POA: Diagnosis not present

## 2020-12-24 DIAGNOSIS — R488 Other symbolic dysfunctions: Secondary | ICD-10-CM | POA: Diagnosis not present

## 2020-12-24 DIAGNOSIS — R2689 Other abnormalities of gait and mobility: Secondary | ICD-10-CM | POA: Diagnosis not present

## 2020-12-27 DIAGNOSIS — R262 Difficulty in walking, not elsewhere classified: Secondary | ICD-10-CM | POA: Diagnosis not present

## 2020-12-27 DIAGNOSIS — M6281 Muscle weakness (generalized): Secondary | ICD-10-CM | POA: Diagnosis not present

## 2020-12-27 DIAGNOSIS — R2689 Other abnormalities of gait and mobility: Secondary | ICD-10-CM | POA: Diagnosis not present

## 2020-12-27 DIAGNOSIS — R41841 Cognitive communication deficit: Secondary | ICD-10-CM | POA: Diagnosis not present

## 2020-12-27 DIAGNOSIS — Z20828 Contact with and (suspected) exposure to other viral communicable diseases: Secondary | ICD-10-CM | POA: Diagnosis not present

## 2020-12-27 DIAGNOSIS — R1312 Dysphagia, oropharyngeal phase: Secondary | ICD-10-CM | POA: Diagnosis not present

## 2020-12-27 DIAGNOSIS — R488 Other symbolic dysfunctions: Secondary | ICD-10-CM | POA: Diagnosis not present

## 2020-12-28 DIAGNOSIS — E559 Vitamin D deficiency, unspecified: Secondary | ICD-10-CM | POA: Diagnosis not present

## 2020-12-28 DIAGNOSIS — I509 Heart failure, unspecified: Secondary | ICD-10-CM | POA: Diagnosis not present

## 2020-12-28 DIAGNOSIS — R1312 Dysphagia, oropharyngeal phase: Secondary | ICD-10-CM | POA: Diagnosis not present

## 2020-12-28 DIAGNOSIS — M6281 Muscle weakness (generalized): Secondary | ICD-10-CM | POA: Diagnosis not present

## 2020-12-28 DIAGNOSIS — R41841 Cognitive communication deficit: Secondary | ICD-10-CM | POA: Diagnosis not present

## 2020-12-28 DIAGNOSIS — R488 Other symbolic dysfunctions: Secondary | ICD-10-CM | POA: Diagnosis not present

## 2020-12-28 DIAGNOSIS — R2689 Other abnormalities of gait and mobility: Secondary | ICD-10-CM | POA: Diagnosis not present

## 2020-12-28 DIAGNOSIS — R262 Difficulty in walking, not elsewhere classified: Secondary | ICD-10-CM | POA: Diagnosis not present

## 2020-12-29 DIAGNOSIS — R41841 Cognitive communication deficit: Secondary | ICD-10-CM | POA: Diagnosis not present

## 2020-12-29 DIAGNOSIS — R262 Difficulty in walking, not elsewhere classified: Secondary | ICD-10-CM | POA: Diagnosis not present

## 2020-12-29 DIAGNOSIS — R488 Other symbolic dysfunctions: Secondary | ICD-10-CM | POA: Diagnosis not present

## 2020-12-29 DIAGNOSIS — R1312 Dysphagia, oropharyngeal phase: Secondary | ICD-10-CM | POA: Diagnosis not present

## 2020-12-29 DIAGNOSIS — R2689 Other abnormalities of gait and mobility: Secondary | ICD-10-CM | POA: Diagnosis not present

## 2020-12-29 DIAGNOSIS — M6281 Muscle weakness (generalized): Secondary | ICD-10-CM | POA: Diagnosis not present

## 2020-12-30 ENCOUNTER — Telehealth: Payer: Self-pay | Admitting: *Deleted

## 2020-12-30 DIAGNOSIS — R488 Other symbolic dysfunctions: Secondary | ICD-10-CM | POA: Diagnosis not present

## 2020-12-30 DIAGNOSIS — M6281 Muscle weakness (generalized): Secondary | ICD-10-CM | POA: Diagnosis not present

## 2020-12-30 DIAGNOSIS — R41841 Cognitive communication deficit: Secondary | ICD-10-CM | POA: Diagnosis not present

## 2020-12-30 DIAGNOSIS — R262 Difficulty in walking, not elsewhere classified: Secondary | ICD-10-CM | POA: Diagnosis not present

## 2020-12-30 DIAGNOSIS — R1312 Dysphagia, oropharyngeal phase: Secondary | ICD-10-CM | POA: Diagnosis not present

## 2020-12-30 DIAGNOSIS — R2689 Other abnormalities of gait and mobility: Secondary | ICD-10-CM | POA: Diagnosis not present

## 2020-12-30 NOTE — Telephone Encounter (Signed)
Called [facility] to speak to nurse in memory care facility [phone] about obtaining labs in 2 weeks from patient.  Call went to voice mail and left a message for them to call [phone] and ask for triage.

## 2020-12-30 NOTE — Telephone Encounter (Signed)
Medication list updated per Dr. Ross/MyChart messages from patients daughter.    Will ask his facility to check BMET, BNP in 2 weeks.

## 2020-12-30 NOTE — Telephone Encounter (Signed)
Reviewed records from [facility]   On 12/18/20 note that pt's Sats were low at times   Rales noted   CXR ordered and pt started on ABX (Zpac; should be done by now) CXR from 4/5 showed sl infiltrate in RLL.   Also modest LLL infiltrate   Then on 12/22/20 spironolactone increased to 25 bid and repeat CXR ordered for 2 wks   Labs on 12/28/20 are OK     Please amend med list to spironolactone 25 bid    They need to check BMET and BNP in 2 wks

## 2020-12-31 DIAGNOSIS — R488 Other symbolic dysfunctions: Secondary | ICD-10-CM | POA: Diagnosis not present

## 2020-12-31 DIAGNOSIS — R1312 Dysphagia, oropharyngeal phase: Secondary | ICD-10-CM | POA: Diagnosis not present

## 2020-12-31 DIAGNOSIS — M6281 Muscle weakness (generalized): Secondary | ICD-10-CM | POA: Diagnosis not present

## 2020-12-31 DIAGNOSIS — R2689 Other abnormalities of gait and mobility: Secondary | ICD-10-CM | POA: Diagnosis not present

## 2020-12-31 DIAGNOSIS — R262 Difficulty in walking, not elsewhere classified: Secondary | ICD-10-CM | POA: Diagnosis not present

## 2020-12-31 DIAGNOSIS — R41841 Cognitive communication deficit: Secondary | ICD-10-CM | POA: Diagnosis not present

## 2021-01-03 DIAGNOSIS — R41841 Cognitive communication deficit: Secondary | ICD-10-CM | POA: Diagnosis not present

## 2021-01-03 DIAGNOSIS — Z961 Presence of intraocular lens: Secondary | ICD-10-CM | POA: Diagnosis not present

## 2021-01-03 DIAGNOSIS — R2689 Other abnormalities of gait and mobility: Secondary | ICD-10-CM | POA: Diagnosis not present

## 2021-01-03 DIAGNOSIS — R262 Difficulty in walking, not elsewhere classified: Secondary | ICD-10-CM | POA: Diagnosis not present

## 2021-01-03 DIAGNOSIS — R488 Other symbolic dysfunctions: Secondary | ICD-10-CM | POA: Diagnosis not present

## 2021-01-03 DIAGNOSIS — H179 Unspecified corneal scar and opacity: Secondary | ICD-10-CM | POA: Diagnosis not present

## 2021-01-03 DIAGNOSIS — M6281 Muscle weakness (generalized): Secondary | ICD-10-CM | POA: Diagnosis not present

## 2021-01-03 DIAGNOSIS — R1312 Dysphagia, oropharyngeal phase: Secondary | ICD-10-CM | POA: Diagnosis not present

## 2021-01-06 DIAGNOSIS — R1312 Dysphagia, oropharyngeal phase: Secondary | ICD-10-CM | POA: Diagnosis not present

## 2021-01-06 DIAGNOSIS — R488 Other symbolic dysfunctions: Secondary | ICD-10-CM | POA: Diagnosis not present

## 2021-01-06 DIAGNOSIS — R2689 Other abnormalities of gait and mobility: Secondary | ICD-10-CM | POA: Diagnosis not present

## 2021-01-06 DIAGNOSIS — M6281 Muscle weakness (generalized): Secondary | ICD-10-CM | POA: Diagnosis not present

## 2021-01-06 DIAGNOSIS — R41841 Cognitive communication deficit: Secondary | ICD-10-CM | POA: Diagnosis not present

## 2021-01-06 DIAGNOSIS — R262 Difficulty in walking, not elsewhere classified: Secondary | ICD-10-CM | POA: Diagnosis not present

## 2021-01-07 DIAGNOSIS — R1312 Dysphagia, oropharyngeal phase: Secondary | ICD-10-CM | POA: Diagnosis not present

## 2021-01-07 DIAGNOSIS — R41841 Cognitive communication deficit: Secondary | ICD-10-CM | POA: Diagnosis not present

## 2021-01-07 DIAGNOSIS — R488 Other symbolic dysfunctions: Secondary | ICD-10-CM | POA: Diagnosis not present

## 2021-01-07 DIAGNOSIS — M6281 Muscle weakness (generalized): Secondary | ICD-10-CM | POA: Diagnosis not present

## 2021-01-07 DIAGNOSIS — R2689 Other abnormalities of gait and mobility: Secondary | ICD-10-CM | POA: Diagnosis not present

## 2021-01-07 DIAGNOSIS — R262 Difficulty in walking, not elsewhere classified: Secondary | ICD-10-CM | POA: Diagnosis not present

## 2021-01-10 DIAGNOSIS — Z20828 Contact with and (suspected) exposure to other viral communicable diseases: Secondary | ICD-10-CM | POA: Diagnosis not present

## 2021-01-10 DIAGNOSIS — R488 Other symbolic dysfunctions: Secondary | ICD-10-CM | POA: Diagnosis not present

## 2021-01-10 DIAGNOSIS — R41841 Cognitive communication deficit: Secondary | ICD-10-CM | POA: Diagnosis not present

## 2021-01-10 DIAGNOSIS — R1312 Dysphagia, oropharyngeal phase: Secondary | ICD-10-CM | POA: Diagnosis not present

## 2021-01-10 DIAGNOSIS — M6281 Muscle weakness (generalized): Secondary | ICD-10-CM | POA: Diagnosis not present

## 2021-01-10 DIAGNOSIS — R262 Difficulty in walking, not elsewhere classified: Secondary | ICD-10-CM | POA: Diagnosis not present

## 2021-01-10 DIAGNOSIS — R2689 Other abnormalities of gait and mobility: Secondary | ICD-10-CM | POA: Diagnosis not present

## 2021-01-11 DIAGNOSIS — R2689 Other abnormalities of gait and mobility: Secondary | ICD-10-CM | POA: Diagnosis not present

## 2021-01-11 DIAGNOSIS — R1312 Dysphagia, oropharyngeal phase: Secondary | ICD-10-CM | POA: Diagnosis not present

## 2021-01-11 DIAGNOSIS — R41841 Cognitive communication deficit: Secondary | ICD-10-CM | POA: Diagnosis not present

## 2021-01-11 DIAGNOSIS — R488 Other symbolic dysfunctions: Secondary | ICD-10-CM | POA: Diagnosis not present

## 2021-01-11 DIAGNOSIS — R262 Difficulty in walking, not elsewhere classified: Secondary | ICD-10-CM | POA: Diagnosis not present

## 2021-01-11 DIAGNOSIS — M6281 Muscle weakness (generalized): Secondary | ICD-10-CM | POA: Diagnosis not present

## 2021-01-12 DIAGNOSIS — R262 Difficulty in walking, not elsewhere classified: Secondary | ICD-10-CM | POA: Diagnosis not present

## 2021-01-12 DIAGNOSIS — R488 Other symbolic dysfunctions: Secondary | ICD-10-CM | POA: Diagnosis not present

## 2021-01-12 DIAGNOSIS — R41841 Cognitive communication deficit: Secondary | ICD-10-CM | POA: Diagnosis not present

## 2021-01-12 DIAGNOSIS — M6281 Muscle weakness (generalized): Secondary | ICD-10-CM | POA: Diagnosis not present

## 2021-01-12 DIAGNOSIS — J189 Pneumonia, unspecified organism: Secondary | ICD-10-CM | POA: Diagnosis not present

## 2021-01-12 DIAGNOSIS — R2689 Other abnormalities of gait and mobility: Secondary | ICD-10-CM | POA: Diagnosis not present

## 2021-01-12 DIAGNOSIS — R1312 Dysphagia, oropharyngeal phase: Secondary | ICD-10-CM | POA: Diagnosis not present

## 2021-01-13 DIAGNOSIS — R2689 Other abnormalities of gait and mobility: Secondary | ICD-10-CM | POA: Diagnosis not present

## 2021-01-13 DIAGNOSIS — R488 Other symbolic dysfunctions: Secondary | ICD-10-CM | POA: Diagnosis not present

## 2021-01-13 DIAGNOSIS — M6281 Muscle weakness (generalized): Secondary | ICD-10-CM | POA: Diagnosis not present

## 2021-01-13 DIAGNOSIS — R41841 Cognitive communication deficit: Secondary | ICD-10-CM | POA: Diagnosis not present

## 2021-01-13 DIAGNOSIS — R262 Difficulty in walking, not elsewhere classified: Secondary | ICD-10-CM | POA: Diagnosis not present

## 2021-01-13 DIAGNOSIS — R1312 Dysphagia, oropharyngeal phase: Secondary | ICD-10-CM | POA: Diagnosis not present

## 2021-01-14 ENCOUNTER — Telehealth: Payer: Self-pay | Admitting: Internal Medicine

## 2021-01-14 DIAGNOSIS — R262 Difficulty in walking, not elsewhere classified: Secondary | ICD-10-CM | POA: Diagnosis not present

## 2021-01-14 DIAGNOSIS — R1312 Dysphagia, oropharyngeal phase: Secondary | ICD-10-CM | POA: Diagnosis not present

## 2021-01-14 DIAGNOSIS — R41841 Cognitive communication deficit: Secondary | ICD-10-CM | POA: Diagnosis not present

## 2021-01-14 DIAGNOSIS — R600 Localized edema: Secondary | ICD-10-CM | POA: Diagnosis not present

## 2021-01-14 DIAGNOSIS — M6281 Muscle weakness (generalized): Secondary | ICD-10-CM | POA: Diagnosis not present

## 2021-01-14 DIAGNOSIS — R488 Other symbolic dysfunctions: Secondary | ICD-10-CM | POA: Diagnosis not present

## 2021-01-14 DIAGNOSIS — I509 Heart failure, unspecified: Secondary | ICD-10-CM | POA: Diagnosis not present

## 2021-01-14 DIAGNOSIS — R2689 Other abnormalities of gait and mobility: Secondary | ICD-10-CM | POA: Diagnosis not present

## 2021-01-14 DIAGNOSIS — D649 Anemia, unspecified: Secondary | ICD-10-CM | POA: Diagnosis not present

## 2021-01-14 NOTE — Telephone Encounter (Signed)
Spoke with pt's daughter. Discussed that Dr. Harrington Challenger reviewed labs and asked how pt is feeling.  No new concerns from [name].  Pt doing ok.  He has occas cough.  Will call or my chart if any new changes.

## 2021-01-14 NOTE — Telephone Encounter (Signed)
[name] called to return a phone call for her father in regards to the lab results.Please advise

## 2021-01-17 DIAGNOSIS — R262 Difficulty in walking, not elsewhere classified: Secondary | ICD-10-CM | POA: Diagnosis not present

## 2021-01-17 DIAGNOSIS — M6281 Muscle weakness (generalized): Secondary | ICD-10-CM | POA: Diagnosis not present

## 2021-01-17 DIAGNOSIS — R2689 Other abnormalities of gait and mobility: Secondary | ICD-10-CM | POA: Diagnosis not present

## 2021-01-18 DIAGNOSIS — R262 Difficulty in walking, not elsewhere classified: Secondary | ICD-10-CM | POA: Diagnosis not present

## 2021-01-18 DIAGNOSIS — M6281 Muscle weakness (generalized): Secondary | ICD-10-CM | POA: Diagnosis not present

## 2021-01-18 DIAGNOSIS — R2689 Other abnormalities of gait and mobility: Secondary | ICD-10-CM | POA: Diagnosis not present

## 2021-01-20 ENCOUNTER — Other Ambulatory Visit: Payer: Self-pay

## 2021-01-20 ENCOUNTER — Ambulatory Visit (INDEPENDENT_AMBULATORY_CARE_PROVIDER_SITE_OTHER): Payer: Medicare Other | Admitting: Emergency Medicine

## 2021-01-20 DIAGNOSIS — I495 Sick sinus syndrome: Secondary | ICD-10-CM | POA: Diagnosis not present

## 2021-01-20 LAB — CUP PACEART INCLINIC DEVICE CHECK
Battery Impedance: 3700 Ohm
Battery Voltage: 2.76 V
Date Time Interrogation Session: 20220505141710
Implantable Lead Implant Date: 20091023
Implantable Lead Implant Date: 20091023
Implantable Lead Location: 753859
Implantable Lead Location: 753860
Implantable Pulse Generator Implant Date: 20091023
Lead Channel Impedance Value: 382 Ohm
Lead Channel Impedance Value: 467 Ohm
Lead Channel Pacing Threshold Amplitude: 0.75 V
Lead Channel Pacing Threshold Amplitude: 1 V
Lead Channel Pacing Threshold Pulse Width: 0.5 ms
Lead Channel Pacing Threshold Pulse Width: 0.5 ms
Lead Channel Setting Pacing Amplitude: 2 V
Lead Channel Setting Pacing Amplitude: 2.5 V
Lead Channel Setting Pacing Pulse Width: 0.5 ms
Lead Channel Setting Sensing Sensitivity: 2 mV
Pulse Gen Model: 5826
Pulse Gen Serial Number: 2231585

## 2021-01-20 NOTE — Progress Notes (Signed)
Pacemaker check in clinic. Normal device function. Thresholds, sensing, impedances consistent with previous measurements. Device programmed to maximize longevity. AT/AF burden 6.1%, no EGM's available, longest in duration 42 seconds. No high ventricular rates noted. Device programmed at appropriate safety margins. Histogram distribution appropriate for patient activity level. Device programmed to optimize intrinsic conduction. Estimated longevity 3.75 years. Patients device is not compatible with remote monitoring. Next in-clinic scheduled check 07/26/21. . Patient education completed.

## 2021-01-21 DIAGNOSIS — R262 Difficulty in walking, not elsewhere classified: Secondary | ICD-10-CM | POA: Diagnosis not present

## 2021-01-21 DIAGNOSIS — R2689 Other abnormalities of gait and mobility: Secondary | ICD-10-CM | POA: Diagnosis not present

## 2021-01-21 DIAGNOSIS — M6281 Muscle weakness (generalized): Secondary | ICD-10-CM | POA: Diagnosis not present

## 2021-01-24 DIAGNOSIS — Z20828 Contact with and (suspected) exposure to other viral communicable diseases: Secondary | ICD-10-CM | POA: Diagnosis not present

## 2021-01-25 DIAGNOSIS — R2689 Other abnormalities of gait and mobility: Secondary | ICD-10-CM | POA: Diagnosis not present

## 2021-01-25 DIAGNOSIS — M6281 Muscle weakness (generalized): Secondary | ICD-10-CM | POA: Diagnosis not present

## 2021-01-25 DIAGNOSIS — R262 Difficulty in walking, not elsewhere classified: Secondary | ICD-10-CM | POA: Diagnosis not present

## 2021-01-26 DIAGNOSIS — R262 Difficulty in walking, not elsewhere classified: Secondary | ICD-10-CM | POA: Diagnosis not present

## 2021-01-26 DIAGNOSIS — R2689 Other abnormalities of gait and mobility: Secondary | ICD-10-CM | POA: Diagnosis not present

## 2021-01-26 DIAGNOSIS — M6281 Muscle weakness (generalized): Secondary | ICD-10-CM | POA: Diagnosis not present

## 2021-01-28 DIAGNOSIS — M6281 Muscle weakness (generalized): Secondary | ICD-10-CM | POA: Diagnosis not present

## 2021-01-28 DIAGNOSIS — R262 Difficulty in walking, not elsewhere classified: Secondary | ICD-10-CM | POA: Diagnosis not present

## 2021-01-28 DIAGNOSIS — R2689 Other abnormalities of gait and mobility: Secondary | ICD-10-CM | POA: Diagnosis not present

## 2021-01-31 DIAGNOSIS — R2689 Other abnormalities of gait and mobility: Secondary | ICD-10-CM | POA: Diagnosis not present

## 2021-01-31 DIAGNOSIS — R262 Difficulty in walking, not elsewhere classified: Secondary | ICD-10-CM | POA: Diagnosis not present

## 2021-01-31 DIAGNOSIS — M6281 Muscle weakness (generalized): Secondary | ICD-10-CM | POA: Diagnosis not present

## 2021-01-31 DIAGNOSIS — Z20828 Contact with and (suspected) exposure to other viral communicable diseases: Secondary | ICD-10-CM | POA: Diagnosis not present

## 2021-02-01 DIAGNOSIS — Z7952 Long term (current) use of systemic steroids: Secondary | ICD-10-CM | POA: Diagnosis not present

## 2021-02-01 DIAGNOSIS — I495 Sick sinus syndrome: Secondary | ICD-10-CM | POA: Diagnosis not present

## 2021-02-01 DIAGNOSIS — K219 Gastro-esophageal reflux disease without esophagitis: Secondary | ICD-10-CM | POA: Diagnosis not present

## 2021-02-01 DIAGNOSIS — E785 Hyperlipidemia, unspecified: Secondary | ICD-10-CM | POA: Diagnosis not present

## 2021-02-01 DIAGNOSIS — S51811D Laceration without foreign body of right forearm, subsequent encounter: Secondary | ICD-10-CM | POA: Diagnosis not present

## 2021-02-01 DIAGNOSIS — Q600 Renal agenesis, unilateral: Secondary | ICD-10-CM | POA: Diagnosis not present

## 2021-02-01 DIAGNOSIS — Z7902 Long term (current) use of antithrombotics/antiplatelets: Secondary | ICD-10-CM | POA: Diagnosis not present

## 2021-02-01 DIAGNOSIS — Z9181 History of falling: Secondary | ICD-10-CM | POA: Diagnosis not present

## 2021-02-01 DIAGNOSIS — I10 Essential (primary) hypertension: Secondary | ICD-10-CM | POA: Diagnosis not present

## 2021-02-01 DIAGNOSIS — I2510 Atherosclerotic heart disease of native coronary artery without angina pectoris: Secondary | ICD-10-CM | POA: Diagnosis not present

## 2021-02-01 DIAGNOSIS — N400 Enlarged prostate without lower urinary tract symptoms: Secondary | ICD-10-CM | POA: Diagnosis not present

## 2021-02-01 DIAGNOSIS — Z7982 Long term (current) use of aspirin: Secondary | ICD-10-CM | POA: Diagnosis not present

## 2021-02-02 DIAGNOSIS — E785 Hyperlipidemia, unspecified: Secondary | ICD-10-CM | POA: Diagnosis not present

## 2021-02-02 DIAGNOSIS — N400 Enlarged prostate without lower urinary tract symptoms: Secondary | ICD-10-CM | POA: Diagnosis not present

## 2021-02-02 DIAGNOSIS — I2510 Atherosclerotic heart disease of native coronary artery without angina pectoris: Secondary | ICD-10-CM | POA: Diagnosis not present

## 2021-02-02 DIAGNOSIS — S51811D Laceration without foreign body of right forearm, subsequent encounter: Secondary | ICD-10-CM | POA: Diagnosis not present

## 2021-02-02 DIAGNOSIS — I495 Sick sinus syndrome: Secondary | ICD-10-CM | POA: Diagnosis not present

## 2021-02-02 DIAGNOSIS — I10 Essential (primary) hypertension: Secondary | ICD-10-CM | POA: Diagnosis not present

## 2021-02-02 NOTE — Telephone Encounter (Signed)
Reviewed records from neurology along with cardiac records Given problems with healing I would stop Plavix    Keep on ASA

## 2021-02-03 DIAGNOSIS — E785 Hyperlipidemia, unspecified: Secondary | ICD-10-CM | POA: Diagnosis not present

## 2021-02-03 DIAGNOSIS — I2510 Atherosclerotic heart disease of native coronary artery without angina pectoris: Secondary | ICD-10-CM | POA: Diagnosis not present

## 2021-02-03 DIAGNOSIS — I495 Sick sinus syndrome: Secondary | ICD-10-CM | POA: Diagnosis not present

## 2021-02-03 DIAGNOSIS — S51811D Laceration without foreign body of right forearm, subsequent encounter: Secondary | ICD-10-CM | POA: Diagnosis not present

## 2021-02-03 DIAGNOSIS — I10 Essential (primary) hypertension: Secondary | ICD-10-CM | POA: Diagnosis not present

## 2021-02-03 DIAGNOSIS — N400 Enlarged prostate without lower urinary tract symptoms: Secondary | ICD-10-CM | POA: Diagnosis not present

## 2021-02-03 NOTE — Telephone Encounter (Signed)
VM left for nurse at [facility] that Dr. Harrington Challenger recommends to stop Plavix and continue aspirin 81 mg daily.  Asked that they call back to confirm they received message.

## 2021-02-07 ENCOUNTER — Telehealth: Payer: Self-pay | Admitting: Internal Medicine

## 2021-02-07 DIAGNOSIS — I2510 Atherosclerotic heart disease of native coronary artery without angina pectoris: Secondary | ICD-10-CM | POA: Diagnosis not present

## 2021-02-07 DIAGNOSIS — S51811D Laceration without foreign body of right forearm, subsequent encounter: Secondary | ICD-10-CM | POA: Diagnosis not present

## 2021-02-07 DIAGNOSIS — N400 Enlarged prostate without lower urinary tract symptoms: Secondary | ICD-10-CM | POA: Diagnosis not present

## 2021-02-07 DIAGNOSIS — I10 Essential (primary) hypertension: Secondary | ICD-10-CM | POA: Diagnosis not present

## 2021-02-07 DIAGNOSIS — I495 Sick sinus syndrome: Secondary | ICD-10-CM | POA: Diagnosis not present

## 2021-02-07 DIAGNOSIS — E785 Hyperlipidemia, unspecified: Secondary | ICD-10-CM | POA: Diagnosis not present

## 2021-02-07 DIAGNOSIS — I6522 Occlusion and stenosis of left carotid artery: Secondary | ICD-10-CM

## 2021-02-07 NOTE — Telephone Encounter (Signed)
Patient would like to move the carotid appt to July but the order expires on 02/17/21. Could we please have order expiration date moved to later date so we can reschedule?

## 2021-02-08 NOTE — Telephone Encounter (Signed)
Updated carotid doppler order so that pt can get scheduled.  Message back to scheduler K. Thacker.

## 2021-02-14 DIAGNOSIS — Z20828 Contact with and (suspected) exposure to other viral communicable diseases: Secondary | ICD-10-CM | POA: Diagnosis not present

## 2021-02-17 ENCOUNTER — Inpatient Hospital Stay (HOSPITAL_COMMUNITY): Admission: RE | Admit: 2021-02-17 | Payer: Medicare Other | Source: Ambulatory Visit

## 2021-02-17 DIAGNOSIS — S51811D Laceration without foreign body of right forearm, subsequent encounter: Secondary | ICD-10-CM | POA: Diagnosis not present

## 2021-02-17 DIAGNOSIS — E785 Hyperlipidemia, unspecified: Secondary | ICD-10-CM | POA: Diagnosis not present

## 2021-02-17 DIAGNOSIS — N400 Enlarged prostate without lower urinary tract symptoms: Secondary | ICD-10-CM | POA: Diagnosis not present

## 2021-02-17 DIAGNOSIS — I2510 Atherosclerotic heart disease of native coronary artery without angina pectoris: Secondary | ICD-10-CM | POA: Diagnosis not present

## 2021-02-17 DIAGNOSIS — I10 Essential (primary) hypertension: Secondary | ICD-10-CM | POA: Diagnosis not present

## 2021-02-17 DIAGNOSIS — I495 Sick sinus syndrome: Secondary | ICD-10-CM | POA: Diagnosis not present

## 2021-02-21 DIAGNOSIS — Z20828 Contact with and (suspected) exposure to other viral communicable diseases: Secondary | ICD-10-CM | POA: Diagnosis not present

## 2021-02-24 DIAGNOSIS — I2510 Atherosclerotic heart disease of native coronary artery without angina pectoris: Secondary | ICD-10-CM | POA: Diagnosis not present

## 2021-02-24 DIAGNOSIS — I10 Essential (primary) hypertension: Secondary | ICD-10-CM | POA: Diagnosis not present

## 2021-02-24 DIAGNOSIS — E785 Hyperlipidemia, unspecified: Secondary | ICD-10-CM | POA: Diagnosis not present

## 2021-02-24 DIAGNOSIS — N400 Enlarged prostate without lower urinary tract symptoms: Secondary | ICD-10-CM | POA: Diagnosis not present

## 2021-02-24 DIAGNOSIS — S51811D Laceration without foreign body of right forearm, subsequent encounter: Secondary | ICD-10-CM | POA: Diagnosis not present

## 2021-02-24 DIAGNOSIS — I495 Sick sinus syndrome: Secondary | ICD-10-CM | POA: Diagnosis not present

## 2021-02-24 DIAGNOSIS — Z20828 Contact with and (suspected) exposure to other viral communicable diseases: Secondary | ICD-10-CM | POA: Diagnosis not present

## 2021-02-28 DIAGNOSIS — Z20828 Contact with and (suspected) exposure to other viral communicable diseases: Secondary | ICD-10-CM | POA: Diagnosis not present

## 2021-03-07 DIAGNOSIS — Z20828 Contact with and (suspected) exposure to other viral communicable diseases: Secondary | ICD-10-CM | POA: Diagnosis not present

## 2021-03-09 ENCOUNTER — Telehealth: Payer: Self-pay | Admitting: Neurology

## 2021-03-09 NOTE — Telephone Encounter (Signed)
Per Dr Posey Pronto:   Patient has not been seen since 2021, and has a scheduled follow-up coming up. We won't be able to appeal this decision until we have more clinical information.  Thanks,  DP

## 2021-03-09 NOTE — Telephone Encounter (Signed)
Received denial letter 03/03/2021 for Gammagard Inj 10 gm/100   Waiting for Dr Posey Pronto to advise next steps.

## 2021-03-10 DIAGNOSIS — Z20828 Contact with and (suspected) exposure to other viral communicable diseases: Secondary | ICD-10-CM | POA: Diagnosis not present

## 2021-03-17 DIAGNOSIS — M79674 Pain in right toe(s): Secondary | ICD-10-CM | POA: Diagnosis not present

## 2021-03-17 DIAGNOSIS — M79675 Pain in left toe(s): Secondary | ICD-10-CM | POA: Diagnosis not present

## 2021-03-17 DIAGNOSIS — B351 Tinea unguium: Secondary | ICD-10-CM | POA: Diagnosis not present

## 2021-03-18 ENCOUNTER — Emergency Department (HOSPITAL_COMMUNITY): Payer: Medicare Other

## 2021-03-18 ENCOUNTER — Inpatient Hospital Stay (HOSPITAL_COMMUNITY)
Admission: EM | Admit: 2021-03-18 | Discharge: 2021-03-23 | DRG: 481 | Disposition: A | Discharge status: Skilled Nursing Facility | Payer: Medicare Other | Source: Skilled Nursing Facility | Attending: Internal Medicine | Admitting: Internal Medicine

## 2021-03-18 ENCOUNTER — Encounter (HOSPITAL_COMMUNITY): Payer: Self-pay | Admitting: Family Medicine

## 2021-03-18 DIAGNOSIS — Z950 Presence of cardiac pacemaker: Secondary | ICD-10-CM

## 2021-03-18 DIAGNOSIS — D72828 Other elevated white blood cell count: Secondary | ICD-10-CM | POA: Diagnosis present

## 2021-03-18 DIAGNOSIS — D6959 Other secondary thrombocytopenia: Secondary | ICD-10-CM | POA: Diagnosis not present

## 2021-03-18 DIAGNOSIS — S72121A Displaced fracture of lesser trochanter of right femur, initial encounter for closed fracture: Secondary | ICD-10-CM | POA: Diagnosis not present

## 2021-03-18 DIAGNOSIS — R0902 Hypoxemia: Secondary | ICD-10-CM | POA: Diagnosis not present

## 2021-03-18 DIAGNOSIS — E871 Hypo-osmolality and hyponatremia: Secondary | ICD-10-CM | POA: Diagnosis not present

## 2021-03-18 DIAGNOSIS — Z7952 Long term (current) use of systemic steroids: Secondary | ICD-10-CM | POA: Diagnosis not present

## 2021-03-18 DIAGNOSIS — W19XXXA Unspecified fall, initial encounter: Secondary | ICD-10-CM

## 2021-03-18 DIAGNOSIS — N289 Disorder of kidney and ureter, unspecified: Secondary | ICD-10-CM

## 2021-03-18 DIAGNOSIS — S72001A Fracture of unspecified part of neck of right femur, initial encounter for closed fracture: Secondary | ICD-10-CM | POA: Diagnosis not present

## 2021-03-18 DIAGNOSIS — R739 Hyperglycemia, unspecified: Secondary | ICD-10-CM | POA: Diagnosis present

## 2021-03-18 DIAGNOSIS — W010XXA Fall on same level from slipping, tripping and stumbling without subsequent striking against object, initial encounter: Secondary | ICD-10-CM | POA: Diagnosis present

## 2021-03-18 DIAGNOSIS — T380X5A Adverse effect of glucocorticoids and synthetic analogues, initial encounter: Secondary | ICD-10-CM | POA: Diagnosis present

## 2021-03-18 DIAGNOSIS — I10 Essential (primary) hypertension: Secondary | ICD-10-CM | POA: Diagnosis not present

## 2021-03-18 DIAGNOSIS — I672 Cerebral atherosclerosis: Secondary | ICD-10-CM | POA: Diagnosis not present

## 2021-03-18 DIAGNOSIS — S81812A Laceration without foreign body, left lower leg, initial encounter: Secondary | ICD-10-CM | POA: Diagnosis present

## 2021-03-18 DIAGNOSIS — Z419 Encounter for procedure for purposes other than remedying health state, unspecified: Secondary | ICD-10-CM

## 2021-03-18 DIAGNOSIS — Z8249 Family history of ischemic heart disease and other diseases of the circulatory system: Secondary | ICD-10-CM

## 2021-03-18 DIAGNOSIS — N179 Acute kidney failure, unspecified: Secondary | ICD-10-CM | POA: Diagnosis not present

## 2021-03-18 DIAGNOSIS — G7000 Myasthenia gravis without (acute) exacerbation: Secondary | ICD-10-CM

## 2021-03-18 DIAGNOSIS — D6489 Other specified anemias: Secondary | ICD-10-CM | POA: Diagnosis not present

## 2021-03-18 DIAGNOSIS — I2510 Atherosclerotic heart disease of native coronary artery without angina pectoris: Secondary | ICD-10-CM

## 2021-03-18 DIAGNOSIS — J849 Interstitial pulmonary disease, unspecified: Secondary | ICD-10-CM | POA: Diagnosis present

## 2021-03-18 DIAGNOSIS — Y92128 Other place in nursing home as the place of occurrence of the external cause: Secondary | ICD-10-CM | POA: Diagnosis not present

## 2021-03-18 DIAGNOSIS — Z20822 Contact with and (suspected) exposure to covid-19: Secondary | ICD-10-CM | POA: Diagnosis not present

## 2021-03-18 DIAGNOSIS — T84195A Other mechanical complication of internal fixation device of left femur, initial encounter: Secondary | ICD-10-CM | POA: Diagnosis not present

## 2021-03-18 DIAGNOSIS — Z7401 Bed confinement status: Secondary | ICD-10-CM | POA: Diagnosis not present

## 2021-03-18 DIAGNOSIS — R339 Retention of urine, unspecified: Secondary | ICD-10-CM | POA: Diagnosis present

## 2021-03-18 DIAGNOSIS — I700 Atherosclerosis of aorta: Secondary | ICD-10-CM | POA: Diagnosis not present

## 2021-03-18 DIAGNOSIS — S51811A Laceration without foreign body of right forearm, initial encounter: Secondary | ICD-10-CM | POA: Diagnosis not present

## 2021-03-18 DIAGNOSIS — Z79899 Other long term (current) drug therapy: Secondary | ICD-10-CM | POA: Diagnosis not present

## 2021-03-18 DIAGNOSIS — I495 Sick sinus syndrome: Secondary | ICD-10-CM | POA: Diagnosis not present

## 2021-03-18 DIAGNOSIS — Z043 Encounter for examination and observation following other accident: Secondary | ICD-10-CM | POA: Diagnosis not present

## 2021-03-18 DIAGNOSIS — G301 Alzheimer's disease with late onset: Secondary | ICD-10-CM

## 2021-03-18 DIAGNOSIS — M25572 Pain in left ankle and joints of left foot: Secondary | ICD-10-CM | POA: Diagnosis not present

## 2021-03-18 DIAGNOSIS — R58 Hemorrhage, not elsewhere classified: Secondary | ICD-10-CM | POA: Diagnosis not present

## 2021-03-18 DIAGNOSIS — M2578 Osteophyte, vertebrae: Secondary | ICD-10-CM | POA: Diagnosis not present

## 2021-03-18 DIAGNOSIS — K219 Gastro-esophageal reflux disease without esophagitis: Secondary | ICD-10-CM | POA: Diagnosis present

## 2021-03-18 DIAGNOSIS — I739 Peripheral vascular disease, unspecified: Secondary | ICD-10-CM | POA: Diagnosis not present

## 2021-03-18 DIAGNOSIS — R410 Disorientation, unspecified: Secondary | ICD-10-CM | POA: Diagnosis not present

## 2021-03-18 DIAGNOSIS — S72141A Displaced intertrochanteric fracture of right femur, initial encounter for closed fracture: Principal | ICD-10-CM

## 2021-03-18 DIAGNOSIS — Z7982 Long term (current) use of aspirin: Secondary | ICD-10-CM

## 2021-03-18 DIAGNOSIS — F0281 Dementia in other diseases classified elsewhere with behavioral disturbance: Secondary | ICD-10-CM

## 2021-03-18 DIAGNOSIS — M47812 Spondylosis without myelopathy or radiculopathy, cervical region: Secondary | ICD-10-CM | POA: Diagnosis not present

## 2021-03-18 DIAGNOSIS — E785 Hyperlipidemia, unspecified: Secondary | ICD-10-CM | POA: Diagnosis present

## 2021-03-18 DIAGNOSIS — S72111A Displaced fracture of greater trochanter of right femur, initial encounter for closed fracture: Secondary | ICD-10-CM | POA: Diagnosis not present

## 2021-03-18 DIAGNOSIS — R4182 Altered mental status, unspecified: Secondary | ICD-10-CM | POA: Diagnosis not present

## 2021-03-18 DIAGNOSIS — M25551 Pain in right hip: Secondary | ICD-10-CM | POA: Diagnosis not present

## 2021-03-18 DIAGNOSIS — F02818 Dementia in other diseases classified elsewhere, unspecified severity, with other behavioral disturbance: Secondary | ICD-10-CM | POA: Diagnosis present

## 2021-03-18 DIAGNOSIS — E782 Mixed hyperlipidemia: Secondary | ICD-10-CM | POA: Diagnosis not present

## 2021-03-18 LAB — CBC WITH DIFFERENTIAL/PLATELET
Abs Immature Granulocytes: 0.24 10*3/uL — ABNORMAL HIGH (ref 0.00–0.07)
Basophils Absolute: 0 10*3/uL (ref 0.0–0.1)
Basophils Relative: 0 %
Eosinophils Absolute: 0 10*3/uL (ref 0.0–0.5)
Eosinophils Relative: 0 %
HCT: 42.2 % (ref 39.0–52.0)
Hemoglobin: 14.1 g/dL (ref 13.0–17.0)
Immature Granulocytes: 1 %
Lymphocytes Relative: 5 %
Lymphs Abs: 1 10*3/uL (ref 0.7–4.0)
MCH: 32.6 pg (ref 26.0–34.0)
MCHC: 33.4 g/dL (ref 30.0–36.0)
MCV: 97.7 fL (ref 80.0–100.0)
Monocytes Absolute: 1.9 10*3/uL — ABNORMAL HIGH (ref 0.1–1.0)
Monocytes Relative: 9 %
Neutro Abs: 18 10*3/uL — ABNORMAL HIGH (ref 1.7–7.7)
Neutrophils Relative %: 85 %
Platelets: 197 10*3/uL (ref 150–400)
RBC: 4.32 MIL/uL (ref 4.22–5.81)
RDW: 15.4 % (ref 11.5–15.5)
WBC: 21.1 10*3/uL — ABNORMAL HIGH (ref 4.0–10.5)
nRBC: 0 % (ref 0.0–0.2)

## 2021-03-18 LAB — RESP PANEL BY RT-PCR (FLU A&B, COVID) ARPGX2
Influenza A by PCR: NEGATIVE
Influenza B by PCR: NEGATIVE
SARS Coronavirus 2 by RT PCR: NEGATIVE

## 2021-03-18 LAB — BASIC METABOLIC PANEL
Anion gap: 9 (ref 5–15)
BUN: 42 mg/dL — ABNORMAL HIGH (ref 8–23)
CO2: 21 mmol/L — ABNORMAL LOW (ref 22–32)
Calcium: 9.1 mg/dL (ref 8.9–10.3)
Chloride: 100 mmol/L (ref 98–111)
Creatinine, Ser: 1.61 mg/dL — ABNORMAL HIGH (ref 0.61–1.24)
GFR, Estimated: 41 mL/min — ABNORMAL LOW (ref >60)
Glucose, Bld: 170 mg/dL — ABNORMAL HIGH (ref 70–99)
Potassium: 5.1 mmol/L (ref 3.5–5.1)
Sodium: 130 mmol/L — ABNORMAL LOW (ref 135–145)

## 2021-03-18 MED ORDER — ONDANSETRON HCL 4 MG/2ML IJ SOLN
4.0000 mg | Freq: Four times a day (QID) | INTRAMUSCULAR | Status: DC | PRN
  Administered 2021-03-19: 4 mg via INTRAVENOUS
  Filled 2021-03-18: qty 2

## 2021-03-18 MED ORDER — SODIUM CHLORIDE 0.9 % IV BOLUS
500.0000 mL | Freq: Once | INTRAVENOUS | Status: AC
  Administered 2021-03-19: 500 mL via INTRAVENOUS

## 2021-03-18 MED ORDER — FENTANYL CITRATE (PF) 100 MCG/2ML IJ SOLN
100.0000 ug | Freq: Once | INTRAMUSCULAR | Status: AC
  Administered 2021-03-19: 100 ug via INTRAVENOUS
  Filled 2021-03-18: qty 2

## 2021-03-18 MED ORDER — SENNOSIDES-DOCUSATE SODIUM 8.6-50 MG PO TABS: 1.0000 | ORAL_TABLET | Freq: Every evening | ORAL | Status: DC | PRN

## 2021-03-18 MED ORDER — FENTANYL CITRATE (PF) 100 MCG/2ML IJ SOLN
25.0000 ug | INTRAMUSCULAR | Status: DC | PRN
  Administered 2021-03-19: 25 ug via INTRAVENOUS
  Administered 2021-03-19: 50 ug via INTRAVENOUS
  Filled 2021-03-18 (×2): qty 2

## 2021-03-18 MED ORDER — PREDNISONE 20 MG PO TABS
20.0000 mg | ORAL_TABLET | Freq: Every day | ORAL | Status: DC
  Administered 2021-03-19 – 2021-03-23 (×5): 20 mg via ORAL
  Filled 2021-03-18 (×4): qty 1

## 2021-03-18 MED ORDER — METHOCARBAMOL 1000 MG/10ML IJ SOLN
500.0000 mg | Freq: Four times a day (QID) | INTRAMUSCULAR | Status: DC | PRN
  Filled 2021-03-18 (×4): qty 5

## 2021-03-18 MED ORDER — SODIUM CHLORIDE 0.9 % IV SOLN: INTRAVENOUS | Status: AC

## 2021-03-18 MED ORDER — PYRIDOSTIGMINE BROMIDE 60 MG PO TABS
60.0000 mg | ORAL_TABLET | ORAL | Status: DC
  Administered 2021-03-19 – 2021-03-23 (×18): 60 mg via ORAL
  Filled 2021-03-18 (×23): qty 1

## 2021-03-18 MED ORDER — LORAZEPAM 0.5 MG PO TABS
0.5000 mg | ORAL_TABLET | Freq: Every evening | ORAL | Status: DC | PRN
  Administered 2021-03-21 – 2021-03-22 (×3): 0.5 mg via ORAL
  Filled 2021-03-18 (×3): qty 1

## 2021-03-18 MED ORDER — FINASTERIDE 5 MG PO TABS
5.0000 mg | ORAL_TABLET | Freq: Every day | ORAL | Status: DC
  Administered 2021-03-20 – 2021-03-23 (×4): 5 mg via ORAL
  Filled 2021-03-18 (×4): qty 1

## 2021-03-18 MED ORDER — FENTANYL CITRATE (PF) 100 MCG/2ML IJ SOLN
50.0000 ug | Freq: Once | INTRAMUSCULAR | Status: AC
  Administered 2021-03-18: 50 ug via INTRAVENOUS
  Filled 2021-03-18: qty 2

## 2021-03-18 MED ORDER — METHOCARBAMOL 500 MG PO TABS
500.0000 mg | ORAL_TABLET | Freq: Four times a day (QID) | ORAL | Status: DC | PRN
  Administered 2021-03-21 (×3): 500 mg via ORAL
  Filled 2021-03-18 (×3): qty 1

## 2021-03-18 NOTE — H&P (Signed)
History and Physical    REASON HELZER ZOX:[id number] DOB: [birth year] DOA: 03/18/2021  PCP: Shirline Frees, MD   Patient coming from: SNF   Chief Complaint: Fall, right hip pain   HPI: [name] is a 87 y.o. male with medical history significant for dementia, hypertension, sick sinus syndrome with pacer, myasthenia gravis, coronary artery disease, now presenting to the emergency department with right hip pain after fall.  He is accompanied by his daughter who assists with the history.  Patient had been in his usual state of health and was ambulating in his room with a walker when he fell and was complaining of severe right leg pain.  He denies hitting his head or losing consciousness, denies chest pain, and denies shortness of breath or cough.  He has not had any recent illness or respiratory issues per family report.  He was treated with fentanyl prior to arrival in the ED.  ED Course: Upon arrival to the ED, patient is found to be afebrile, saturating well on room air with respiratory rate in 81X and systolic blood pressure in the low 100s.  Chemistry panel notable for glucose 270, sodium 130, and creatinine 1.69, up from 1.09 last December.  CBC features a leukocytosis to 21,100.  Head CT is negative for acute intracranial abnormality.  No acute cervical spine fracture or traumatic listhesis noted on CT.  Chest x-ray with chronic interstitial changes.  Radiographs of the hip demonstrate acute comminuted and displaced right intertrochanteric femur fracture.  Orthopedic surgery was consulted by the ED physician and the patient was treated with fentanyl in the ED.  Review of Systems:  All other systems reviewed and apart from HPI, are negative.  Past Medical History:  Diagnosis Date   Carotid artery disease ([place])    Coronary artery disease    PCI RCA 2009   Dyslipidemia    Hypertension    Scleritis, unspecified    treated with prednisone   Seizure ([place])    felt due to cyclosporin in  setting of electrolyte disorder   Solitary kidney    Symptomatic bradycardia    a. s/p STJ dual chamber pacemaker    Past Surgical History:  Procedure Laterality Date   CARDIAC CATHETERIZATION     12 years ago?   COLONOSCOPY     EYE SURGERY     right eye about 10 years ago   ORIF HUMERUS FRACTURE Right 07/03/2019   Procedure: Right distal humerus open reduction, internal fixation;  Surgeon: Verner Mould, MD;  Location: [facility];  Service: Orthopedics;  Laterality: Right;   PACEMAKER INSERTION  2009   STJ dual chamber pacemaker implanted by Dr Olevia Perches for symptomatic bradycardia    Social History:   reports that he has never smoked. He has never used smokeless tobacco. He reports current alcohol use. He reports that he does not use drugs.  Allergies  Allergen Reactions   Cefuroxime Axetil Other (See Comments)    Reaction not recalled   Cyclosporine Other (See Comments)    Reaction not recalled   Elemental Sulfur Other (See Comments)    No energy, could not walk    Maxzide [Triamterene-Hctz] Other (See Comments)    Adverse reaction- bloating and abdominal pain   Sulfa Antibiotics Other (See Comments)    Could not sleep/eat and experienced gastric pain   Sulfamethoxazole Other (See Comments)    Could not sleep/eat and had gastric pain   Sulfamethoxazole-Trimethoprim Other (See Comments)    Gastric  pain and could not eat/sleep   Prednisone Other (See Comments)    Can tolerate for a short period of time    Family History  Problem Relation Age of Onset   Arthritis/Rheumatoid Mother    Heart disease Mother    COPD Father    Alcohol abuse Son      Prior to Admission medications   Medication Sig Start Date End Date Taking? Authorizing Provider  amLODipine (NORVASC) 2.5 MG tablet Take 2.5 mg by mouth every morning.   Yes [provider]  aspirin 81 MG chewable tablet Chew 81 mg by mouth every morning.   Yes [provider]  carvedilol (COREG) 6.25  MG tablet Take 6.25 mg by mouth 2 (two) times daily.   Yes [provider]  Ensure (ENSURE) Take 237 mLs by mouth daily as needed (meal refusal).   Yes [provider]  Neomycin-Bacitracin-Polymyxin (TRIPLE ANTIBIOTIC) OINT Apply 1 application topically See admin instructions. Apply small amount of triple antibiotic ointment to non-stick pad and cover wound, wrap with coban until healed   Yes [provider]  pyridostigmine (MESTINON) 60 MG tablet Take 1 tablet at 7am, 10am 1pm, and 5pm Patient taking differently: Take 60 mg by mouth See admin instructions. Take one tablet (60 mg) by mouth four times daily - 6:30am, 10am, 1pm and 5pm 07/16/20  Yes Patel, Donika K, DO  spironolactone (ALDACTONE) 25 MG tablet Take 0.5 tablets (12.5 mg total) by mouth daily. Patient taking differently: Take 25 mg by mouth 2 (two) times daily with a meal. 07/19/20  Yes Fay Records, MD  amLODipine (NORVASC) 5 MG tablet Take 1 tablet by mouth once daily Patient not taking: Reported on 03/18/2021 07/19/20   Fay Records, MD  finasteride (PROSCAR) 5 MG tablet Take 5 mg by mouth daily.    [provider]  GAMMAGARD 5 GM/50ML SOLN every 21 ( twenty-one) days. 08/24/20   [provider]  LORazepam (ATIVAN) 1 MG tablet Take 1 mg by mouth at bedtime.    [provider]  NITROSTAT 0.4 MG SL tablet DISSOLVE ONE TABLET UNDER THE TONGUE EVERY 5 MINUTES AS NEEDED FOR CHEST PAIN.  DO NOT EXCEED A TOTAL OF 3 DOSES IN 15 MINUTES Patient taking differently: Place 0.4 mg under the tongue every 5 (five) minutes x 3 doses as needed for chest pain. 11/19/18   Fay Records, MD  pantoprazole (PROTONIX) 40 MG tablet Take 1 tablet (40 mg total) by mouth daily. Patient taking differently: Take 40 mg by mouth daily before breakfast. 08/06/20   Narda Amber K, DO  predniSONE (DELTASONE) 20 MG tablet Take 1 tablet (20 mg total) by mouth daily with breakfast. 08/16/20   Narda Amber K, DO   QUEtiapine (SEROQUEL) 25 MG tablet Take 1 tablet (25 mg total) by mouth at bedtime as needed. 08/31/20   Pahwani, Michell Heinrich, MD  risperiDONE (RISPERDAL) 0.5 MG tablet Take 0.5 mg by mouth at bedtime.    [provider]    Physical Exam: Vitals:   03/18/21 2215 03/18/21 2230 03/18/21 2245 03/18/21 2300  BP: 114/64 104/74 92/64 105/63  Pulse: (!) 102 (!) 102 95 (!) 101  Resp: (!) 22 (!) 32 (!) 26 (!) 30  Temp:      TempSrc:      SpO2: 96% 95% 90% 97%    Constitutional: NAD, calm  Eyes: PERTLA, lids and conjunctivae normal ENMT: Mucous membranes are moist. Posterior pharynx clear of any exudate or  lesions.   Neck:  supple, no masses  Respiratory: no wheezing, no crackles. No accessory muscle use.  Cardiovascular: S1 & S2 heard, regular rate and rhythm. No extremity edema.  Abdomen: No distension, no tenderness, soft. Bowel sounds active.  Musculoskeletal: no clubbing / cyanosis. No joint deformity upper and lower extremities.   Skin: no significant rashes, lesions, ulcers. Warm, dry, well-perfused. Neurologic: CN 2-12 grossly intact. Sensation intact. Moving all extremities.  Psychiatric: Alert and oriented to person and place only. Restless.    Labs and Imaging on Admission: I have personally reviewed following labs and imaging studies  CBC: Recent Labs  Lab 03/18/21 2159  WBC 21.1*  NEUTROABS 18.0*  HGB 14.1  HCT 42.2  MCV 97.7  PLT 536   Basic Metabolic Panel: Recent Labs  Lab 03/18/21 2159  NA 130*  K 5.1  CL 100  CO2 21*  GLUCOSE 170*  BUN 42*  CREATININE 1.61*  CALCIUM 9.1   GFR: CrCl cannot be calculated (Unknown ideal weight.). Liver Function Tests: No results for input(s): AST, ALT, ALKPHOS, BILITOT, PROT, ALBUMIN in the last 168 hours. No results for input(s): LIPASE, AMYLASE in the last 168 hours. No results for input(s): AMMONIA in the last 168 hours. Coagulation Profile: No results for input(s): INR, PROTIME in the last 168  hours. Cardiac Enzymes: No results for input(s): CKTOTAL, CKMB, CKMBINDEX, TROPONINI in the last 168 hours. BNP (last 3 results) No results for input(s): PROBNP in the last 8760 hours. HbA1C: No results for input(s): HGBA1C in the last 72 hours. CBG: No results for input(s): GLUCAP in the last 168 hours. Lipid Profile: No results for input(s): CHOL, HDL, LDLCALC, TRIG, CHOLHDL, LDLDIRECT in the last 72 hours. Thyroid Function Tests: No results for input(s): TSH, T4TOTAL, FREET4, T3FREE, THYROIDAB in the last 72 hours. Anemia Panel: No results for input(s): VITAMINB12, FOLATE, FERRITIN, TIBC, IRON, RETICCTPCT in the last 72 hours. Urine analysis:    Component Value Date/Time   COLORURINE STRAW (A) 08/26/2020 1404   APPEARANCEUR CLEAR 08/26/2020 1404   APPEARANCEUR Cloudy (A) 08/03/2020 1420   LABSPEC 1.010 08/26/2020 1404   PHURINE 6.0 08/26/2020 1404   GLUCOSEU NEGATIVE 08/26/2020 1404   GLUCOSEU NEGATIVE 08/14/2011 1058   HGBUR NEGATIVE 08/26/2020 1404   BILIRUBINUR NEGATIVE 08/26/2020 1404   BILIRUBINUR Negative 08/03/2020 1420   KETONESUR NEGATIVE 08/26/2020 1404   PROTEINUR NEGATIVE 08/26/2020 1404   UROBILINOGEN 0.2 08/14/2011 1058   NITRITE NEGATIVE 08/26/2020 1404   LEUKOCYTESUR NEGATIVE 08/26/2020 1404   Sepsis Labs: @LABRCNTIP (procalcitonin:4,lacticidven:4) ) Recent Results (from the past 240 hour(s))  Resp Panel by RT-PCR (Flu A&B, Covid) Nasopharyngeal Swab     Status: None   Collection Time: 03/18/21 10:01 PM   Specimen: Nasopharyngeal Swab; Nasopharyngeal(NP) swabs in vial transport medium  Result Value Ref Range Status   SARS Coronavirus 2 by RT PCR NEGATIVE NEGATIVE Final    Comment: (NOTE) SARS-CoV-2 target nucleic acids are NOT DETECTED.  The SARS-CoV-2 RNA is generally detectable in upper respiratory specimens during the acute phase of infection. The lowest concentration of SARS-CoV-2 viral copies this assay can detect is 138 copies/mL. A negative  result does not preclude SARS-Cov-2 infection and should not be used as the sole basis for treatment or other patient management decisions. A negative result may occur with  improper specimen collection/handling, submission of specimen other than nasopharyngeal swab, presence of viral mutation(s) within the areas targeted by this assay, and inadequate number of viral copies(<138 copies/mL). A negative result must  be combined with clinical observations, patient history, and epidemiological information. The expected result is Negative.  Fact Sheet for Patients:  EntrepreneurPulse.com.au  Fact Sheet for Healthcare Providers:  IncredibleEmployment.be  This test is no t yet approved or cleared by the [place] FDA and  has been authorized for detection and/or diagnosis of SARS-CoV-2 by FDA under an Emergency Use Authorization (EUA). This EUA will remain  in effect (meaning this test can be used) for the duration of the COVID-19 declaration under Section 564(b)(1) of the Act, 21 U.S.C.section 360bbb-3(b)(1), unless the authorization is terminated  or revoked sooner.       Influenza A by PCR NEGATIVE NEGATIVE Final   Influenza B by PCR NEGATIVE NEGATIVE Final    Comment: (NOTE) The Xpert Xpress SARS-CoV-2/FLU/RSV plus assay is intended as an aid in the diagnosis of influenza from Nasopharyngeal swab specimens and should not be used as a sole basis for treatment. Nasal washings and aspirates are unacceptable for Xpert Xpress SARS-CoV-2/FLU/RSV testing.  Fact Sheet for Patients: EntrepreneurPulse.com.au  Fact Sheet for Healthcare Providers: IncredibleEmployment.be  This test is not yet approved or cleared by the [place] FDA and has been authorized for detection and/or diagnosis of SARS-CoV-2 by FDA under an Emergency Use Authorization (EUA). This EUA will remain in effect (meaning this test can be used)  for the duration of the COVID-19 declaration under Section 564(b)(1) of the Act, 21 U.S.C. section 360bbb-3(b)(1), unless the authorization is terminated or revoked.  Performed at [facility], [place] [address] [place], [place] [address]      Radiological Exams on Admission: DG Chest 1 View  Result Date: 03/18/2021 CLINICAL DATA:  Fall with deformity EXAM: CHEST  1 VIEW COMPARISON:  08/26/2020, 05/12/2020, 04/09/2020 FINDINGS: Apical pleural thickening. Left-sided pacing device as before. Bilateral peripheral and lower lung reticular opacity consistent with interstitial fibrosis and chronic lung disease. No definite acute superimposed airspace disease, pleural effusion, or pneumothorax. Stable cardiomediastinal silhouette with aortic atherosclerosis. IMPRESSION: Chronic interstitial lung disease without definite acute interval change since 08/26/2020. Electronically Signed   By: Donavan Foil M.D.   On: 03/18/2021 21:58   DG Pelvis 1-2 Views  Result Date: 03/18/2021 CLINICAL DATA:  Fall with right femur deformity EXAM: PELVIS - 1-2 VIEW COMPARISON:  None. FINDINGS: Pubic symphysis and rami appear intact. Both femoral heads project in joint. SI joints are non widened. Acute comminuted right intertrochanteric fracture with displaced lesser trochanteric fracture fragments. IMPRESSION: Acute comminuted and displaced right intertrochanteric fracture Electronically Signed   By: Donavan Foil M.D.   On: 03/18/2021 22:00   DG Tibia/Fibula Right  Result Date: 03/18/2021 CLINICAL DATA:  Fall with right femur deformity EXAM: RIGHT TIBIA AND FIBULA - 2 VIEW COMPARISON:  None. FINDINGS: There is no evidence of fracture or other focal bone lesions. Soft tissues are unremarkable. IMPRESSION: Negative. Electronically Signed   By: Donavan Foil M.D.   On: 03/18/2021 21:59   CT Head Wo Contrast  Result Date: 03/18/2021 CLINICAL DATA:  Fall EXAM: CT HEAD WITHOUT CONTRAST CT CERVICAL SPINE WITHOUT CONTRAST  TECHNIQUE: Multidetector CT imaging of the head and cervical spine was performed following the standard protocol without intravenous contrast. Multiplanar CT image reconstructions of the cervical spine were also generated. COMPARISON:  CT head 08/26/2020, cervical spine radiograph 05/13/2020, CT cervical spine 03/03/2007 FINDINGS: CT HEAD FINDINGS Brain: Mild streak artifact across the posterior fossa from dental amalgam. No evidence of acute infarction, hemorrhage, hydrocephalus, extra-axial collection, visible mass lesion or mass  effect. Symmetric prominence of the ventricles, cisterns and sulci compatible with parenchymal volume loss. Patchy areas of white matter hypoattenuation are most compatible with chronic microvascular angiopathy. Vascular: Atherosclerotic calcification of the carotid siphons and intradural vertebral arteries. No hyperdense vessel. Skull: No significant scalp swelling or hematoma. No calvarial fracture or focal osseous abnormality. Sinuses/Orbits: Paranasal sinuses and mastoid air cells are predominantly clear. Middle ear cavities are clear. Debris in the right external auditory canal. Orbital structures are unremarkable aside from prior lens extractions. Other: Bilateral TMJ arthrosis. CT CERVICAL SPINE FINDINGS Alignment: Cervical stabilization collar in place at the time exam. Preservation of the normal cervical lordosis. Levocurvature of the cervical spine with dextrocurvature of the imaged thoracic levels. Some of which may be positional. No evidence of traumatic listhesis. No abnormally widened, perched or jumped facets. Normal alignment of the craniocervical and atlantoaxial articulations accounting for mild rightward cranial rotation. Skull base and vertebrae: No acute skull base fracture. No vertebral body fracture or height loss. Normal bone mineralization. No worrisome osseous lesions. Arthrosis about the atlantodental and basion dens intervals. Additional multilevel cervical  spondylitic changes, further detailed below. Soft tissues and spinal canal: No pre or paravertebral fluid or swelling. No visible canal hematoma. Airways patent. No concerning adenopathy. Calcifications proximal great vessels and aortic arch as well as the cervical carotids. Partial visualization a left chest wall pacer pack and subclavian leads. Disc levels: Multilevel intervertebral disc height loss with spondylitic endplate changes. Some multilevel disc osteophyte complexes partially efface the ventral thecal sac with more pronounced findings at the C3-4, C4-5 level where this results in at least mild canal stenosis. Multilevel uncinate spurring facet hypertrophic changes result in mild multilevel neural foraminal narrowing with more moderate narrowing at C3-4 as well. Upper chest: Progressive biapical pleuroparenchymal scarring and reticular interstitial changes, incompletely assessed on this exam. Consolidative process or convincing edema. No acute traumatic findings in the upper chest. Other: Normal thyroid. IMPRESSION: 1. No acute intracranial abnormality. No significant scalp swelling or hematoma. No calvarial fracture. 2. Background of microvascular angiopathy and diffuse parenchymal loss. 3. No acute fracture or traumatic listhesis of the cervical spine. 4. Possible cervical spondylitic changes; most pronounced about the atlantodental and basion dens interval as well as at the C3-4 level there is mild canal stenosis and moderate bilateral foraminal narrowing. 5. Progressive scarring and interstitial reticular changes in lung apices. Incompletely assessed on this exam. Could consider outpatient HRCT for further interrogation as warranted. 6. Intracranial, cervical carotid and aortic atherosclerosis. Electronically Signed   By: Lovena Le M.D.   On: 03/18/2021 21:47   CT Cervical Spine Wo Contrast  Result Date: 03/18/2021 CLINICAL DATA:  Fall EXAM: CT HEAD WITHOUT CONTRAST CT CERVICAL SPINE WITHOUT  CONTRAST TECHNIQUE: Multidetector CT imaging of the head and cervical spine was performed following the standard protocol without intravenous contrast. Multiplanar CT image reconstructions of the cervical spine were also generated. COMPARISON:  CT head 08/26/2020, cervical spine radiograph 05/13/2020, CT cervical spine 03/03/2007 FINDINGS: CT HEAD FINDINGS Brain: Mild streak artifact across the posterior fossa from dental amalgam. No evidence of acute infarction, hemorrhage, hydrocephalus, extra-axial collection, visible mass lesion or mass effect. Symmetric prominence of the ventricles, cisterns and sulci compatible with parenchymal volume loss. Patchy areas of white matter hypoattenuation are most compatible with chronic microvascular angiopathy. Vascular: Atherosclerotic calcification of the carotid siphons and intradural vertebral arteries. No hyperdense vessel. Skull: No significant scalp swelling or hematoma. No calvarial fracture or focal osseous abnormality. Sinuses/Orbits: Paranasal sinuses and mastoid  air cells are predominantly clear. Middle ear cavities are clear. Debris in the right external auditory canal. Orbital structures are unremarkable aside from prior lens extractions. Other: Bilateral TMJ arthrosis. CT CERVICAL SPINE FINDINGS Alignment: Cervical stabilization collar in place at the time exam. Preservation of the normal cervical lordosis. Levocurvature of the cervical spine with dextrocurvature of the imaged thoracic levels. Some of which may be positional. No evidence of traumatic listhesis. No abnormally widened, perched or jumped facets. Normal alignment of the craniocervical and atlantoaxial articulations accounting for mild rightward cranial rotation. Skull base and vertebrae: No acute skull base fracture. No vertebral body fracture or height loss. Normal bone mineralization. No worrisome osseous lesions. Arthrosis about the atlantodental and basion dens intervals. Additional multilevel  cervical spondylitic changes, further detailed below. Soft tissues and spinal canal: No pre or paravertebral fluid or swelling. No visible canal hematoma. Airways patent. No concerning adenopathy. Calcifications proximal great vessels and aortic arch as well as the cervical carotids. Partial visualization a left chest wall pacer pack and subclavian leads. Disc levels: Multilevel intervertebral disc height loss with spondylitic endplate changes. Some multilevel disc osteophyte complexes partially efface the ventral thecal sac with more pronounced findings at the C3-4, C4-5 level where this results in at least mild canal stenosis. Multilevel uncinate spurring facet hypertrophic changes result in mild multilevel neural foraminal narrowing with more moderate narrowing at C3-4 as well. Upper chest: Progressive biapical pleuroparenchymal scarring and reticular interstitial changes, incompletely assessed on this exam. Consolidative process or convincing edema. No acute traumatic findings in the upper chest. Other: Normal thyroid. IMPRESSION: 1. No acute intracranial abnormality. No significant scalp swelling or hematoma. No calvarial fracture. 2. Background of microvascular angiopathy and diffuse parenchymal loss. 3. No acute fracture or traumatic listhesis of the cervical spine. 4. Possible cervical spondylitic changes; most pronounced about the atlantodental and basion dens interval as well as at the C3-4 level there is mild canal stenosis and moderate bilateral foraminal narrowing. 5. Progressive scarring and interstitial reticular changes in lung apices. Incompletely assessed on this exam. Could consider outpatient HRCT for further interrogation as warranted. 6. Intracranial, cervical carotid and aortic atherosclerosis. Electronically Signed   By: Lovena Le M.D.   On: 03/18/2021 21:47   DG FEMUR, MIN 2 VIEWS RIGHT  Result Date: 03/18/2021 CLINICAL DATA:  Fall with femur deformity EXAM: RIGHT FEMUR 2 VIEWS  COMPARISON:  None. FINDINGS: Acute comminuted and displaced right intertrochanteric femur fracture without femoral head dislocation. Vascular calcifications. Mid to distal femur appears intact IMPRESSION: Acute comminuted and displaced right intertrochanteric fracture Electronically Signed   By: Donavan Foil M.D.   On: 03/18/2021 22:01    EKG: Independently reviewed. Sinus rhythm.   Assessment/Plan   1. Right hip fracture  - Presents with right hip pain after a fall and is found to have acute right intertrochanteric femur fracture  - Orthopedic surgery consulting and much appreciated  - Based on the available data, [name] presents an estimated 3.3% risk of perioperative MI or cardiac arrest  - Keep NPO, hold ASA, continue pain-control, recommend 50 mg IV hydrocortisone just prior to surgery and 25 mg q8h x3 after    2. Renal insufficiency; acute urinary retention  - SCr is 1.61 on admission, up from 1.09 in December 2021; he has acute urinary retention in ED  - Check urine chemistries, place Foley for now, check UA and urine chemistries, renally-dose medications, hold Aldactone, hydrate gently with NS, repeat chem panel   3. Myasthenia gravis;  chronic steroid use  - Stable, no recent crisis or respiratory issues  - Continue Mestinon, continue prednisone, recommend hydrocortisone perioperatively   4. Dementia - High-risk for hospital delirium, institute delirium precautions    5. Hyponatremia  - Serum sodium 130 (131-132 when corrected for hyperglycemia) in ED in setting of hypovolemia  - Continue gentle IVF hydration with NS overnight, repeat chem panel in am   6. CAD - No anginal complaints  - Hold ASA preoperatively, hold beta-blocker initially given low-normal BP in ED    7. Hypertension  - SBP low 100s in ED, will hold antihypertensives initially   8. Hyperglycemia  - Serum glucose 170 in ED; A1c was 6.1% in August 2021  - Monitor CBGs and use a low-intensity SSI if needed     DVT prophylaxis: SCDs  Code Status: Full code per discussion with daughter/POA  Level of Care: Level of care: Med-Surg Family Communication: Daughter, [name], updated at bedside Disposition Plan:  Patient is from: SNF  Anticipated d/c is to: SNF  Anticipated d/c date is: 03/22/21 Patient currently: Pending orthopedic surgery consultation, likely operative hip repair  Consults called: Orthopedic surgery  Admission status: Inpatient     Vianne Bulls, MD [facility]  03/19/2021, 12:02 AM

## 2021-03-18 NOTE — ED Triage Notes (Signed)
Pt arrives via EMS from [facility] d/t a fall with possible right deformity. Skin tears notes bilateral elbow and left shin.. Not currently taking blood thinners. Alert and oriented X4 with EMS  20gLAC 132/80 HR 80 91%  RA placed on 2 liters with increase to 97% 100 mcg fent given with improvement.

## 2021-03-18 NOTE — ED Notes (Signed)
Patient transported to CT 

## 2021-03-18 NOTE — Progress Notes (Signed)
Case d/w Dr. Rex Kras.  Pt with right sided hip fracture.  Will need hip nail.  Plan for surgical fixation in the am.  NPO tonight after MN.  Consult note to follow in am.  Appreciate medicine team admission.

## 2021-03-19 ENCOUNTER — Encounter (HOSPITAL_COMMUNITY): Payer: Self-pay | Admitting: Family Medicine

## 2021-03-19 ENCOUNTER — Inpatient Hospital Stay (HOSPITAL_COMMUNITY): Payer: Medicare Other | Admitting: Certified Registered Nurse Anesthetist

## 2021-03-19 ENCOUNTER — Inpatient Hospital Stay (HOSPITAL_COMMUNITY): Payer: Medicare Other

## 2021-03-19 ENCOUNTER — Other Ambulatory Visit: Payer: Self-pay

## 2021-03-19 ENCOUNTER — Encounter (HOSPITAL_COMMUNITY)
Admission: EM | Disposition: A | Discharge status: Skilled Nursing Facility | Payer: Self-pay | Source: Skilled Nursing Facility | Attending: Internal Medicine

## 2021-03-19 DIAGNOSIS — R739 Hyperglycemia, unspecified: Secondary | ICD-10-CM | POA: Diagnosis present

## 2021-03-19 HISTORY — PX: INTRAMEDULLARY (IM) NAIL INTERTROCHANTERIC: SHX5875

## 2021-03-19 LAB — CBC
HCT: 39.5 % (ref 39.0–52.0)
Hemoglobin: 12.9 g/dL — ABNORMAL LOW (ref 13.0–17.0)
MCH: 32.6 pg (ref 26.0–34.0)
MCHC: 32.7 g/dL (ref 30.0–36.0)
MCV: 99.7 fL (ref 80.0–100.0)
Platelets: 187 10*3/uL (ref 150–400)
RBC: 3.96 MIL/uL — ABNORMAL LOW (ref 4.22–5.81)
RDW: 15.5 % (ref 11.5–15.5)
WBC: 20.9 10*3/uL — ABNORMAL HIGH (ref 4.0–10.5)
nRBC: 0 % (ref 0.0–0.2)

## 2021-03-19 LAB — BASIC METABOLIC PANEL
Anion gap: 8 (ref 5–15)
BUN: 47 mg/dL — ABNORMAL HIGH (ref 8–23)
CO2: 22 mmol/L (ref 22–32)
Calcium: 8.7 mg/dL — ABNORMAL LOW (ref 8.9–10.3)
Chloride: 102 mmol/L (ref 98–111)
Creatinine, Ser: 1.97 mg/dL — ABNORMAL HIGH (ref 0.61–1.24)
GFR, Estimated: 32 mL/min — ABNORMAL LOW (ref >60)
Glucose, Bld: 171 mg/dL — ABNORMAL HIGH (ref 70–99)
Potassium: 5.4 mmol/L — ABNORMAL HIGH (ref 3.5–5.1)
Sodium: 132 mmol/L — ABNORMAL LOW (ref 135–145)

## 2021-03-19 LAB — GLUCOSE, CAPILLARY
Glucose-Capillary: 179 mg/dL — ABNORMAL HIGH (ref 70–99)
Glucose-Capillary: 174 mg/dL — ABNORMAL HIGH (ref 70–99)

## 2021-03-19 LAB — HEMOGLOBIN A1C
Hgb A1c MFr Bld: 5.9 % — ABNORMAL HIGH (ref 4.8–5.6)
Mean Plasma Glucose: 122.63 mg/dL

## 2021-03-19 LAB — URINALYSIS, ROUTINE W REFLEX MICROSCOPIC
Bilirubin Urine: NEGATIVE
Glucose, UA: 50 mg/dL — AB
Hgb urine dipstick: NEGATIVE
Ketones, ur: NEGATIVE mg/dL
Leukocytes,Ua: NEGATIVE
Nitrite: NEGATIVE
Protein, ur: NEGATIVE mg/dL
Specific Gravity, Urine: 1.021 (ref 1.005–1.030)
pH: 5 (ref 5.0–8.0)

## 2021-03-19 LAB — CBG MONITORING, ED
Glucose-Capillary: 191 mg/dL — ABNORMAL HIGH (ref 70–99)
Glucose-Capillary: 185 mg/dL — ABNORMAL HIGH (ref 70–99)
Glucose-Capillary: 161 mg/dL — ABNORMAL HIGH (ref 70–99)

## 2021-03-19 SURGERY — FIXATION, FRACTURE, INTERTROCHANTERIC, WITH INTRAMEDULLARY ROD
Anesthesia: General | Laterality: Right

## 2021-03-19 MED ORDER — METOCLOPRAMIDE HCL 5 MG PO TABS: 5.0000 mg | ORAL_TABLET | Freq: Three times a day (TID) | ORAL | Status: DC | PRN

## 2021-03-19 MED ORDER — DOCUSATE SODIUM 100 MG PO CAPS
100.0000 mg | ORAL_CAPSULE | Freq: Two times a day (BID) | ORAL | Status: DC
  Administered 2021-03-19 – 2021-03-23 (×9): 100 mg via ORAL
  Filled 2021-03-19 (×9): qty 1

## 2021-03-19 MED ORDER — EPHEDRINE SULFATE-NACL 50-0.9 MG/10ML-% IV SOSY
PREFILLED_SYRINGE | INTRAVENOUS | Status: DC | PRN
  Administered 2021-03-19: 5 mg via INTRAVENOUS
  Administered 2021-03-19 (×2): 10 mg via INTRAVENOUS

## 2021-03-19 MED ORDER — GLUCERNA SHAKE PO LIQD
237.0000 mL | Freq: Three times a day (TID) | ORAL | Status: DC
  Administered 2021-03-20 – 2021-03-23 (×11): 237 mL via ORAL

## 2021-03-19 MED ORDER — CEFAZOLIN SODIUM-DEXTROSE 2-4 GM/100ML-% IV SOLN: 2.0000 g | INTRAVENOUS | Status: DC

## 2021-03-19 MED ORDER — DEXAMETHASONE SODIUM PHOSPHATE 10 MG/ML IJ SOLN
INTRAMUSCULAR | Status: DC | PRN
  Administered 2021-03-19: 5 mg via INTRAVENOUS

## 2021-03-19 MED ORDER — ADULT MULTIVITAMIN W/MINERALS CH
1.0000 | ORAL_TABLET | Freq: Every day | ORAL | Status: DC
  Administered 2021-03-20 – 2021-03-23 (×4): 1 via ORAL
  Filled 2021-03-19 (×4): qty 1

## 2021-03-19 MED ORDER — PHENYLEPHRINE 40 MCG/ML (10ML) SYRINGE FOR IV PUSH (FOR BLOOD PRESSURE SUPPORT)
PREFILLED_SYRINGE | INTRAVENOUS | Status: DC | PRN
  Administered 2021-03-19: 200 ug via INTRAVENOUS
  Administered 2021-03-19: 120 ug via INTRAVENOUS
  Administered 2021-03-19: 200 ug via INTRAVENOUS

## 2021-03-19 MED ORDER — VASOPRESSIN 20 UNIT/ML IV SOLN
INTRAVENOUS | Status: AC
  Filled 2021-03-19: qty 1

## 2021-03-19 MED ORDER — HYDROCORTISONE NA SUCCINATE PF 100 MG IJ SOLR
25.0000 mg | Freq: Three times a day (TID) | INTRAMUSCULAR | Status: DC
  Administered 2021-03-20: 25 mg via INTRAVENOUS
  Filled 2021-03-19 (×3): qty 0.5

## 2021-03-19 MED ORDER — PHENOL 1.4 % MT LIQD: 1.0000 | OROMUCOSAL | Status: DC | PRN

## 2021-03-19 MED ORDER — CHLORHEXIDINE GLUCONATE 0.12 % MT SOLN: 15.0000 mL | Freq: Once | OROMUCOSAL | Status: AC

## 2021-03-19 MED ORDER — TRANEXAMIC ACID-NACL 1000-0.7 MG/100ML-% IV SOLN
1000.0000 mg | Freq: Once | INTRAVENOUS | Status: AC
Start: 2021-03-19 — End: 2021-03-19
  Administered 2021-03-19: 1000 mg via INTRAVENOUS
  Filled 2021-03-19: qty 100

## 2021-03-19 MED ORDER — PHENYLEPHRINE HCL-NACL 10-0.9 MG/250ML-% IV SOLN
INTRAVENOUS | Status: DC | PRN
  Administered 2021-03-19: 125 ug/min via INTRAVENOUS

## 2021-03-19 MED ORDER — VASOPRESSIN 20 UNIT/ML IV SOLN
INTRAVENOUS | Status: DC | PRN
  Administered 2021-03-19: 1 [IU] via INTRAVENOUS
  Administered 2021-03-19: .5 [IU] via INTRAVENOUS

## 2021-03-19 MED ORDER — TRANEXAMIC ACID-NACL 1000-0.7 MG/100ML-% IV SOLN
1000.0000 mg | INTRAVENOUS | Status: AC
  Administered 2021-03-19: 1000 mg via INTRAVENOUS
  Filled 2021-03-19 (×2): qty 100

## 2021-03-19 MED ORDER — FENTANYL CITRATE (PF) 250 MCG/5ML IJ SOLN
INTRAMUSCULAR | Status: AC
  Filled 2021-03-19: qty 5

## 2021-03-19 MED ORDER — CEFAZOLIN SODIUM-DEXTROSE 2-4 GM/100ML-% IV SOLN
2.0000 g | Freq: Four times a day (QID) | INTRAVENOUS | Status: AC
  Administered 2021-03-19 (×2): 2 g via INTRAVENOUS
  Filled 2021-03-19 (×2): qty 100

## 2021-03-19 MED ORDER — SODIUM ZIRCONIUM CYCLOSILICATE 10 G PO PACK
10.0000 g | PACK | Freq: Once | ORAL | Status: AC
  Administered 2021-03-19: 10 g via ORAL
  Filled 2021-03-19: qty 1

## 2021-03-19 MED ORDER — CHLORHEXIDINE GLUCONATE 0.12 % MT SOLN
OROMUCOSAL | Status: AC
  Administered 2021-03-19: 15 mL via OROMUCOSAL
  Filled 2021-03-19: qty 15

## 2021-03-19 MED ORDER — POVIDONE-IODINE 10 % EX SWAB: 2.0000 "application " | Freq: Once | CUTANEOUS | Status: DC

## 2021-03-19 MED ORDER — 0.9 % SODIUM CHLORIDE (POUR BTL) OPTIME
TOPICAL | Status: DC | PRN
  Administered 2021-03-19: 1000 mL

## 2021-03-19 MED ORDER — ONDANSETRON HCL 4 MG/2ML IJ SOLN
INTRAMUSCULAR | Status: DC | PRN
  Administered 2021-03-19: 4 mg via INTRAVENOUS

## 2021-03-19 MED ORDER — ONDANSETRON HCL 4 MG/2ML IJ SOLN: 4.0000 mg | Freq: Four times a day (QID) | INTRAMUSCULAR | Status: DC | PRN

## 2021-03-19 MED ORDER — PROPOFOL 10 MG/ML IV BOLUS
INTRAVENOUS | Status: DC | PRN
  Administered 2021-03-19: 150 mg via INTRAVENOUS

## 2021-03-19 MED ORDER — ALBUMIN HUMAN 5 % IV SOLN: INTRAVENOUS | Status: DC | PRN

## 2021-03-19 MED ORDER — FENTANYL CITRATE (PF) 250 MCG/5ML IJ SOLN
INTRAMUSCULAR | Status: DC | PRN
  Administered 2021-03-19: 25 ug via INTRAVENOUS

## 2021-03-19 MED ORDER — ONDANSETRON HCL 4 MG PO TABS: 4.0000 mg | ORAL_TABLET | Freq: Four times a day (QID) | ORAL | Status: DC | PRN

## 2021-03-19 MED ORDER — CHLORHEXIDINE GLUCONATE 4 % EX LIQD
60.0000 mL | Freq: Once | CUTANEOUS | Status: DC
  Filled 2021-03-19: qty 60

## 2021-03-19 MED ORDER — PANTOPRAZOLE SODIUM 40 MG PO TBEC
40.0000 mg | DELAYED_RELEASE_TABLET | Freq: Every day | ORAL | Status: DC
  Administered 2021-03-20 – 2021-03-23 (×4): 40 mg via ORAL
  Filled 2021-03-19 (×4): qty 1

## 2021-03-19 MED ORDER — CEFAZOLIN SODIUM-DEXTROSE 2-4 GM/100ML-% IV SOLN
2.0000 g | INTRAVENOUS | Status: AC
  Administered 2021-03-19: 2 g via INTRAVENOUS
  Filled 2021-03-19: qty 100

## 2021-03-19 MED ORDER — METOCLOPRAMIDE HCL 5 MG/ML IJ SOLN: 5.0000 mg | Freq: Three times a day (TID) | INTRAMUSCULAR | Status: DC | PRN

## 2021-03-19 MED ORDER — CHLORHEXIDINE GLUCONATE CLOTH 2 % EX PADS
6.0000 | MEDICATED_PAD | Freq: Every day | CUTANEOUS | Status: DC
  Administered 2021-03-19 – 2021-03-23 (×5): 6 via TOPICAL

## 2021-03-19 MED ORDER — INSULIN ASPART 100 UNIT/ML IJ SOLN
0.0000 [IU] | INTRAMUSCULAR | Status: DC
  Administered 2021-03-19 – 2021-03-22 (×8): 1 [IU] via SUBCUTANEOUS

## 2021-03-19 MED ORDER — HYDROCORTISONE NA SUCCINATE PF 100 MG IJ SOLR: 25.0000 mg | INTRAMUSCULAR | Status: DC

## 2021-03-19 MED ORDER — LIDOCAINE 2% (20 MG/ML) 5 ML SYRINGE
INTRAMUSCULAR | Status: DC | PRN
  Administered 2021-03-19: 60 mg via INTRAVENOUS

## 2021-03-19 MED ORDER — ASPIRIN EC 325 MG PO TBEC
325.0000 mg | DELAYED_RELEASE_TABLET | Freq: Every day | ORAL | Status: DC
  Administered 2021-03-20 – 2021-03-23 (×4): 325 mg via ORAL
  Filled 2021-03-19 (×4): qty 1

## 2021-03-19 MED ORDER — MENTHOL 3 MG MT LOZG: 1.0000 | LOZENGE | OROMUCOSAL | Status: DC | PRN

## 2021-03-19 MED ORDER — LACTATED RINGERS IV SOLN: INTRAVENOUS | Status: DC

## 2021-03-19 MED ORDER — PROPOFOL 10 MG/ML IV BOLUS
INTRAVENOUS | Status: AC
  Filled 2021-03-19: qty 20

## 2021-03-19 MED ORDER — HYDROCORTISONE NA SUCCINATE PF 100 MG IJ SOLR: 50.0000 mg | Freq: Once | INTRAMUSCULAR | Status: DC

## 2021-03-19 SURGICAL SUPPLY — 44 items
ALCOHOL 70% 16 OZ (MISCELLANEOUS) ×2 IMPLANT
BAG COUNTER SPONGE SURGICOUNT (BAG) ×2 IMPLANT
BIT DRILL CALIBRATED 4.2 (BIT) ×1 IMPLANT
BIT DRILL CANN HP 16 (BIT) ×2 IMPLANT
BNDG COHESIVE 6X5 TAN STRL LF (GAUZE/BANDAGES/DRESSINGS) ×4 IMPLANT
CANISTER SUCT 3000ML PPV (MISCELLANEOUS) ×2 IMPLANT
COVER PERINEAL POST (MISCELLANEOUS) ×2 IMPLANT
COVER SURGICAL LIGHT HANDLE (MISCELLANEOUS) ×2 IMPLANT
DRAPE C-ARM 42X72 X-RAY (DRAPES) ×2 IMPLANT
DRAPE HALF SHEET 40X57 (DRAPES) ×2 IMPLANT
DRAPE INCISE IOBAN 66X45 STRL (DRAPES) ×2 IMPLANT
DRAPE STERI IOBAN 125X83 (DRAPES) ×2 IMPLANT
DRESSING MEPILEX FLEX 4X4 (GAUZE/BANDAGES/DRESSINGS) ×1 IMPLANT
DRILL BIT CALIBRATED 4.2 (BIT) ×2
DRSG ADAPTIC 3X8 NADH LF (GAUZE/BANDAGES/DRESSINGS) ×2 IMPLANT
DRSG MEPILEX FLEX 4X4 (GAUZE/BANDAGES/DRESSINGS) ×2
DURAPREP 26ML APPLICATOR (WOUND CARE) ×2 IMPLANT
ELECT CAUTERY BLADE 6.4 (BLADE) ×2 IMPLANT
ELECT REM PT RETURN 9FT ADLT (ELECTROSURGICAL) ×2
ELECTRODE REM PT RTRN 9FT ADLT (ELECTROSURGICAL) ×1 IMPLANT
GAUZE SPONGE 4X4 12PLY STRL (GAUZE/BANDAGES/DRESSINGS) ×2 IMPLANT
GAUZE SPONGE 4X4 12PLY STRL LF (GAUZE/BANDAGES/DRESSINGS) ×2 IMPLANT
GLOVE SRG 8 PF TXTR STRL LF DI (GLOVE) ×2 IMPLANT
GLOVE SURG ENC MOIS LTX SZ7.5 (GLOVE) ×4 IMPLANT
GLOVE SURG UNDER POLY LF SZ8 (GLOVE) ×4
GOWN STRL REUS W/ TWL LRG LVL3 (GOWN DISPOSABLE) ×1 IMPLANT
GOWN STRL REUS W/ TWL XL LVL3 (GOWN DISPOSABLE) ×2 IMPLANT
GOWN STRL REUS W/TWL LRG LVL3 (GOWN DISPOSABLE) ×2
GOWN STRL REUS W/TWL XL LVL3 (GOWN DISPOSABLE) ×4
GUIDEWIRE 3.2X400 (WIRE) ×2 IMPLANT
KIT BASIN OR (CUSTOM PROCEDURE TRAY) ×2 IMPLANT
KIT TURNOVER KIT B (KITS) ×2 IMPLANT
NAIL TROCH FIX 11X235 RT 130 (Nail) ×2 IMPLANT
NS IRRIG 1000ML POUR BTL (IV SOLUTION) ×2 IMPLANT
PACK GENERAL/GYN (CUSTOM PROCEDURE TRAY) ×2 IMPLANT
PAD ARMBOARD 7.5X6 YLW CONV (MISCELLANEOUS) ×4 IMPLANT
SCREW FENS TFNA 125 STL (Screw) ×2 IMPLANT
SCREW LOCK IM TI 5X42 STRL (Screw) ×2 IMPLANT
SPONGE T-LAP 18X18 ~~LOC~~+RFID (SPONGE) ×4 IMPLANT
STAPLER VISISTAT 35W (STAPLE) ×2 IMPLANT
SUT MON AB 2-0 CT1 36 (SUTURE) ×4 IMPLANT
TOWEL GREEN STERILE (TOWEL DISPOSABLE) ×2 IMPLANT
TOWEL GREEN STERILE FF (TOWEL DISPOSABLE) ×2 IMPLANT
WATER STERILE IRR 1000ML POUR (IV SOLUTION) ×2 IMPLANT

## 2021-03-19 NOTE — Anesthesia Postprocedure Evaluation (Signed)
Anesthesia Post Note  Patient: [name]  Procedure(s) Performed: INTRAMEDULLARY (IM) NAIL ANTERIOR TROCHANTRIC (Right)     Patient location during evaluation: PACU Anesthesia Type: General Level of consciousness: awake and alert Pain management: pain level controlled Vital Signs Assessment: post-procedure vital signs reviewed and stable Respiratory status: spontaneous breathing, nonlabored ventilation, respiratory function stable and patient connected to nasal cannula oxygen Cardiovascular status: blood pressure returned to baseline and stable Postop Assessment: no apparent nausea or vomiting Anesthetic complications: no   No notable events documented.  Last Vitals:  Vitals:   03/19/21 1128 03/19/21 1158  BP: 118/65 104/84  Pulse: 76 87  Resp: (!) 24 18  Temp: 36.6 C 36.9 C  SpO2: 94% 95%    Last Pain:  Vitals:   03/19/21 1158  TempSrc: Oral  PainSc:                  March Rummage Jalyah Weinheimer

## 2021-03-19 NOTE — Transfer of Care (Signed)
Immediate Anesthesia Transfer of Care Note  Patient: [name]  Procedure(s) Performed: INTRAMEDULLARY (IM) NAIL ANTERIOR TROCHANTRIC (Right)  Patient Location: PACU  Anesthesia Type:General  Level of Consciousness: awake, alert  and patient cooperative  Airway & Oxygen Therapy: Patient Spontanous Breathing  Post-op Assessment: Report given to RN and Post -op Vital signs reviewed and stable  Post vital signs: Reviewed and stable  Last Vitals:  Vitals Value Taken Time  BP 120/85 03/19/21 1042  Temp    Pulse 69 03/19/21 1045  Resp 19 03/19/21 1045  SpO2 98 % 03/19/21 1045  Vitals shown include unvalidated device data.  Last Pain:  Vitals:   03/19/21 0815  TempSrc: Oral  PainSc:          Complications: No notable events documented.

## 2021-03-19 NOTE — Progress Notes (Signed)
PROGRESS NOTE    [name]  OIZ:[id number] DOB: 08-07-1931 DOA: 03/18/2021 PCP: Shirline Frees, MD   Chief Complain: Fall, right hip pain  Brief Narrative: Patient is a 87 year old male with history of dementia, hypertension, sick sinus syndrome with pacer, myasthenia gravis, coronary artery disease who presented from [facility] memory care to the emergency department with complaint of fall followed by pain in the right hip.  He was ambulating in his room with a walker when he fell and developed severe right hip pain, unable to walk.  On presentation he was mildly hypertensive.  Lab work showed AKI with creatinine of 1.6, leukocytosis.  CT head was negative for acute abnormalities, CT cervical spine did not show any fractures.  X-ray of the hip showed acute comminuted and displaced right intertrochanteric femur fracture.  Orthopedics consulted and planning for ORIF.  Assessment & Plan:   Principal Problem:   Closed displaced intertrochanteric fracture of right femur ([place]) Active Problems:   Essential hypertension   Coronary artery disease involving native coronary artery of native heart without angina pectoris   SSS (sick sinus syndrome) (HCC)   Myasthenia gravis ([place])   Late onset Alzheimer's disease with behavioral disturbance ([place])   Renal insufficiency   Hyponatremia   Hyperglycemia   Right hip fracture: Fell while ambulating with walker in his room at memory care.  X-ray showedacute comminuted and displaced right intertrochanteric femur fracture.  Orthopedics consulted and planning for ORIF.  Continue pain management, supportive care. SCD for DVT prophylaxis for now, may be switched to Lovenox after surgery.  PT/OT consultation after surgery.  Might need a skilled nursing facility on discharge.  Acute urinary retention/AKI: Creatinine was 1.09 in December 21.  Found to be acutely retaining urine in the emergency department.  Creatinine was 1.6 which trended up to 1.9 today.   Potassium also mildly elevated at 5.4, order Lokelma.  Patient was also noticed to be mildly hypertensive. Continue to monitor kidney function, avoid nephrotoxins.  Continue IV fluids. Foley has been placed in the emergency department.  We can start tamsulosin while blood pressure permits.  Respiratory insufficiency: Patient denies any shortness of breath.  Lungs were almost clear on auscultation.  He was requiring 2 L of oxygen per minute.  He does not use oxygen at baseline.  Chest x-ray showed Chronic interstitial lung disease without definite acute interval change since 08/26/2020.  I hope we can transition him to room air after surgery  History of hypertension: Blood pressure soft.  He takes Aldactone at home, currently on hold.  Other hypertensives on hold  History of myasthenia gravis: On chronic steroid use.  On prednisone, Mestinon at home.  Hydrocortisone ordered perioperatively  History of dementia: Currently alert or oriented.  High risk for delirium.  Continue delirium precaution.  Patient resides in memory care  Coronary artery disease: No anginal symptoms.  Takes aspirin.  Also on beta-blocker which is on hold due to low blood pressure  Hyperglycemia: Continue sliding scale insulin.  Recent hemoglobin A1c of 6.1% as per August 2021.  Laceration of left leg: Wound care consulted  Leukocytosis: Most likely reactive.  UA not impressive for UTI.  Continue to monitor with antibiotics            DVT prophylaxis: SCD Code Status: Full code Family Communication: None at the bedside Status is: Inpatient  Remains inpatient appropriate because:Unsafe d/c plan  Dispo: The patient is from:  Memory care  Anticipated d/c is to: SNF              Patient currently is not medically stable to d/c.   Difficult to place patient No     Consultants: Orthopedics  Procedures:None yet  Antimicrobials:  Anti-infectives (From admission, onward)    Start     Dose/Rate  Route Frequency Ordered Stop   03/19/21 0800  ceFAZolin (ANCEF) IVPB 2g/100 mL premix        2 g 200 mL/hr over 30 Minutes Intravenous To [facility] 03/19/21 0157 03/20/21 0800   03/19/21 0600  ceFAZolin (ANCEF) IVPB 2g/100 mL premix  Status:  Discontinued        2 g 200 mL/hr over 30 Minutes Intravenous On call to O.R. 03/19/21 0150 03/19/21 0157       Subjective:  Patient seen and examined the bedside this morning in the emergency department.  Blood pressure was soft during evaluation.  He was actually alert and oriented.  Complains of right hip pain.  He was on Foley catheter.  Patient complains of feeling hot.   Objective: Vitals:   03/19/21 0645 03/19/21 0700 03/19/21 0730 03/19/21 0736  BP: 101/70 98/71 (!) 102/58   Pulse: 81 85 94   Resp: (!) 26 (!) 22 (!) 36 (!) 26  Temp:   97.9 F (36.6 C)   TempSrc:   Oral   SpO2: 95% 97% 93% 99%    Intake/Output Summary (Last 24 hours) at 03/19/2021 7412 Last data filed at 03/19/2021 0203 Gross per 24 hour  Intake 500 ml  Output --  Net 500 ml   There were no vitals filed for this visit.  Examination:  General exam: Appears calm and comfortable ,Not in distress, pleasant elderly male HEENT:PERRL,Oral mucosa moist, Ear/Nose normal on gross exam Respiratory system: Bilateral equal air entry, normal vesicular breath sounds, no wheezes or crackles  Cardiovascular system: S1 & S2 heard, RRR. No JVD, murmurs, rubs, gallops or clicks. No pedal edema. Gastrointestinal system: Abdomen is nondistended, soft and nontender. No organomegaly or masses felt. Normal bowel sounds heard. Central nervous system: Alert and oriented. No focal neurological deficits. Extremities: No edema, no clubbing ,no cyanosis, tenderness of the right hip with diminished range of motion Skin: Scattered bruises, laceration of left leg GU: Foley    Data Reviewed: I have personally reviewed following labs and imaging studies  CBC: Recent Labs  Lab  03/18/21 2159 03/19/21 0635  WBC 21.1* 20.9*  NEUTROABS 18.0*  --   HGB 14.1 12.9*  HCT 42.2 39.5  MCV 97.7 99.7  PLT 197 878   Basic Metabolic Panel: Recent Labs  Lab 03/18/21 2159 03/19/21 0635  NA 130* 132*  K 5.1 5.4*  CL 100 102  CO2 21* 22  GLUCOSE 170* 171*  BUN 42* 47*  CREATININE 1.61* 1.97*  CALCIUM 9.1 8.7*   GFR: CrCl cannot be calculated (Unknown ideal weight.). Liver Function Tests: No results for input(s): AST, ALT, ALKPHOS, BILITOT, PROT, ALBUMIN in the last 168 hours. No results for input(s): LIPASE, AMYLASE in the last 168 hours. No results for input(s): AMMONIA in the last 168 hours. Coagulation Profile: No results for input(s): INR, PROTIME in the last 168 hours. Cardiac Enzymes: No results for input(s): CKTOTAL, CKMB, CKMBINDEX, TROPONINI in the last 168 hours. BNP (last 3 results) No results for input(s): PROBNP in the last 8760 hours. HbA1C: Recent Labs    03/19/21 0334  HGBA1C 5.9*   CBG: Recent Labs  Lab 03/19/21  0208 03/19/21 0338  GLUCAP 191* 185*   Lipid Profile: No results for input(s): CHOL, HDL, LDLCALC, TRIG, CHOLHDL, LDLDIRECT in the last 72 hours. Thyroid Function Tests: No results for input(s): TSH, T4TOTAL, FREET4, T3FREE, THYROIDAB in the last 72 hours. Anemia Panel: No results for input(s): VITAMINB12, FOLATE, FERRITIN, TIBC, IRON, RETICCTPCT in the last 72 hours. Sepsis Labs: No results for input(s): PROCALCITON, LATICACIDVEN in the last 168 hours.  Recent Results (from the past 240 hour(s))  Resp Panel by RT-PCR (Flu A&B, Covid) Nasopharyngeal Swab     Status: None   Collection Time: 03/18/21 10:01 PM   Specimen: Nasopharyngeal Swab; Nasopharyngeal(NP) swabs in vial transport medium  Result Value Ref Range Status   SARS Coronavirus 2 by RT PCR NEGATIVE NEGATIVE Final    Comment: (NOTE) SARS-CoV-2 target nucleic acids are NOT DETECTED.  The SARS-CoV-2 RNA is generally detectable in upper respiratory specimens  during the acute phase of infection. The lowest concentration of SARS-CoV-2 viral copies this assay can detect is 138 copies/mL. A negative result does not preclude SARS-Cov-2 infection and should not be used as the sole basis for treatment or other patient management decisions. A negative result may occur with  improper specimen collection/handling, submission of specimen other than nasopharyngeal swab, presence of viral mutation(s) within the areas targeted by this assay, and inadequate number of viral copies(<138 copies/mL). A negative result must be combined with clinical observations, patient history, and epidemiological information. The expected result is Negative.  Fact Sheet for Patients:  EntrepreneurPulse.com.au  Fact Sheet for Healthcare Providers:  IncredibleEmployment.be  This test is no t yet approved or cleared by the [place] FDA and  has been authorized for detection and/or diagnosis of SARS-CoV-2 by FDA under an Emergency Use Authorization (EUA). This EUA will remain  in effect (meaning this test can be used) for the duration of the COVID-19 declaration under Section 564(b)(1) of the Act, 21 U.S.C.section 360bbb-3(b)(1), unless the authorization is terminated  or revoked sooner.       Influenza A by PCR NEGATIVE NEGATIVE Final   Influenza B by PCR NEGATIVE NEGATIVE Final    Comment: (NOTE) The Xpert Xpress SARS-CoV-2/FLU/RSV plus assay is intended as an aid in the diagnosis of influenza from Nasopharyngeal swab specimens and should not be used as a sole basis for treatment. Nasal washings and aspirates are unacceptable for Xpert Xpress SARS-CoV-2/FLU/RSV testing.  Fact Sheet for Patients: EntrepreneurPulse.com.au  Fact Sheet for Healthcare Providers: IncredibleEmployment.be  This test is not yet approved or cleared by the [place] FDA and has been authorized for detection  and/or diagnosis of SARS-CoV-2 by FDA under an Emergency Use Authorization (EUA). This EUA will remain in effect (meaning this test can be used) for the duration of the COVID-19 declaration under Section 564(b)(1) of the Act, 21 U.S.C. section 360bbb-3(b)(1), unless the authorization is terminated or revoked.  Performed at [facility], [place] [address] [place], [place] [address]          Radiology Studies: DG Chest 1 View  Result Date: 03/18/2021 CLINICAL DATA:  Fall with deformity EXAM: CHEST  1 VIEW COMPARISON:  08/26/2020, 05/12/2020, 04/09/2020 FINDINGS: Apical pleural thickening. Left-sided pacing device as before. Bilateral peripheral and lower lung reticular opacity consistent with interstitial fibrosis and chronic lung disease. No definite acute superimposed airspace disease, pleural effusion, or pneumothorax. Stable cardiomediastinal silhouette with aortic atherosclerosis. IMPRESSION: Chronic interstitial lung disease without definite acute interval change since 08/26/2020. Electronically Signed   By: Maudie Mercury  Francoise Ceo M.D.   On: 03/18/2021 21:58   DG Pelvis 1-2 Views  Result Date: 03/18/2021 CLINICAL DATA:  Fall with right femur deformity EXAM: PELVIS - 1-2 VIEW COMPARISON:  None. FINDINGS: Pubic symphysis and rami appear intact. Both femoral heads project in joint. SI joints are non widened. Acute comminuted right intertrochanteric fracture with displaced lesser trochanteric fracture fragments. IMPRESSION: Acute comminuted and displaced right intertrochanteric fracture Electronically Signed   By: Donavan Foil M.D.   On: 03/18/2021 22:00   DG Tibia/Fibula Right  Result Date: 03/18/2021 CLINICAL DATA:  Fall with right femur deformity EXAM: RIGHT TIBIA AND FIBULA - 2 VIEW COMPARISON:  None. FINDINGS: There is no evidence of fracture or other focal bone lesions. Soft tissues are unremarkable. IMPRESSION: Negative. Electronically Signed   By: Donavan Foil M.D.   On: 03/18/2021 21:59    CT Head Wo Contrast  Result Date: 03/18/2021 CLINICAL DATA:  Fall EXAM: CT HEAD WITHOUT CONTRAST CT CERVICAL SPINE WITHOUT CONTRAST TECHNIQUE: Multidetector CT imaging of the head and cervical spine was performed following the standard protocol without intravenous contrast. Multiplanar CT image reconstructions of the cervical spine were also generated. COMPARISON:  CT head 08/26/2020, cervical spine radiograph 05/13/2020, CT cervical spine 03/03/2007 FINDINGS: CT HEAD FINDINGS Brain: Mild streak artifact across the posterior fossa from dental amalgam. No evidence of acute infarction, hemorrhage, hydrocephalus, extra-axial collection, visible mass lesion or mass effect. Symmetric prominence of the ventricles, cisterns and sulci compatible with parenchymal volume loss. Patchy areas of white matter hypoattenuation are most compatible with chronic microvascular angiopathy. Vascular: Atherosclerotic calcification of the carotid siphons and intradural vertebral arteries. No hyperdense vessel. Skull: No significant scalp swelling or hematoma. No calvarial fracture or focal osseous abnormality. Sinuses/Orbits: Paranasal sinuses and mastoid air cells are predominantly clear. Middle ear cavities are clear. Debris in the right external auditory canal. Orbital structures are unremarkable aside from prior lens extractions. Other: Bilateral TMJ arthrosis. CT CERVICAL SPINE FINDINGS Alignment: Cervical stabilization collar in place at the time exam. Preservation of the normal cervical lordosis. Levocurvature of the cervical spine with dextrocurvature of the imaged thoracic levels. Some of which may be positional. No evidence of traumatic listhesis. No abnormally widened, perched or jumped facets. Normal alignment of the craniocervical and atlantoaxial articulations accounting for mild rightward cranial rotation. Skull base and vertebrae: No acute skull base fracture. No vertebral body fracture or height loss. Normal bone  mineralization. No worrisome osseous lesions. Arthrosis about the atlantodental and basion dens intervals. Additional multilevel cervical spondylitic changes, further detailed below. Soft tissues and spinal canal: No pre or paravertebral fluid or swelling. No visible canal hematoma. Airways patent. No concerning adenopathy. Calcifications proximal great vessels and aortic arch as well as the cervical carotids. Partial visualization a left chest wall pacer pack and subclavian leads. Disc levels: Multilevel intervertebral disc height loss with spondylitic endplate changes. Some multilevel disc osteophyte complexes partially efface the ventral thecal sac with more pronounced findings at the C3-4, C4-5 level where this results in at least mild canal stenosis. Multilevel uncinate spurring facet hypertrophic changes result in mild multilevel neural foraminal narrowing with more moderate narrowing at C3-4 as well. Upper chest: Progressive biapical pleuroparenchymal scarring and reticular interstitial changes, incompletely assessed on this exam. Consolidative process or convincing edema. No acute traumatic findings in the upper chest. Other: Normal thyroid. IMPRESSION: 1. No acute intracranial abnormality. No significant scalp swelling or hematoma. No calvarial fracture. 2. Background of microvascular angiopathy and diffuse parenchymal loss. 3. No acute fracture  or traumatic listhesis of the cervical spine. 4. Possible cervical spondylitic changes; most pronounced about the atlantodental and basion dens interval as well as at the C3-4 level there is mild canal stenosis and moderate bilateral foraminal narrowing. 5. Progressive scarring and interstitial reticular changes in lung apices. Incompletely assessed on this exam. Could consider outpatient HRCT for further interrogation as warranted. 6. Intracranial, cervical carotid and aortic atherosclerosis. Electronically Signed   By: Lovena Le M.D.   On: 03/18/2021 21:47    CT Cervical Spine Wo Contrast  Result Date: 03/18/2021 CLINICAL DATA:  Fall EXAM: CT HEAD WITHOUT CONTRAST CT CERVICAL SPINE WITHOUT CONTRAST TECHNIQUE: Multidetector CT imaging of the head and cervical spine was performed following the standard protocol without intravenous contrast. Multiplanar CT image reconstructions of the cervical spine were also generated. COMPARISON:  CT head 08/26/2020, cervical spine radiograph 05/13/2020, CT cervical spine 03/03/2007 FINDINGS: CT HEAD FINDINGS Brain: Mild streak artifact across the posterior fossa from dental amalgam. No evidence of acute infarction, hemorrhage, hydrocephalus, extra-axial collection, visible mass lesion or mass effect. Symmetric prominence of the ventricles, cisterns and sulci compatible with parenchymal volume loss. Patchy areas of white matter hypoattenuation are most compatible with chronic microvascular angiopathy. Vascular: Atherosclerotic calcification of the carotid siphons and intradural vertebral arteries. No hyperdense vessel. Skull: No significant scalp swelling or hematoma. No calvarial fracture or focal osseous abnormality. Sinuses/Orbits: Paranasal sinuses and mastoid air cells are predominantly clear. Middle ear cavities are clear. Debris in the right external auditory canal. Orbital structures are unremarkable aside from prior lens extractions. Other: Bilateral TMJ arthrosis. CT CERVICAL SPINE FINDINGS Alignment: Cervical stabilization collar in place at the time exam. Preservation of the normal cervical lordosis. Levocurvature of the cervical spine with dextrocurvature of the imaged thoracic levels. Some of which may be positional. No evidence of traumatic listhesis. No abnormally widened, perched or jumped facets. Normal alignment of the craniocervical and atlantoaxial articulations accounting for mild rightward cranial rotation. Skull base and vertebrae: No acute skull base fracture. No vertebral body fracture or height loss. Normal  bone mineralization. No worrisome osseous lesions. Arthrosis about the atlantodental and basion dens intervals. Additional multilevel cervical spondylitic changes, further detailed below. Soft tissues and spinal canal: No pre or paravertebral fluid or swelling. No visible canal hematoma. Airways patent. No concerning adenopathy. Calcifications proximal great vessels and aortic arch as well as the cervical carotids. Partial visualization a left chest wall pacer pack and subclavian leads. Disc levels: Multilevel intervertebral disc height loss with spondylitic endplate changes. Some multilevel disc osteophyte complexes partially efface the ventral thecal sac with more pronounced findings at the C3-4, C4-5 level where this results in at least mild canal stenosis. Multilevel uncinate spurring facet hypertrophic changes result in mild multilevel neural foraminal narrowing with more moderate narrowing at C3-4 as well. Upper chest: Progressive biapical pleuroparenchymal scarring and reticular interstitial changes, incompletely assessed on this exam. Consolidative process or convincing edema. No acute traumatic findings in the upper chest. Other: Normal thyroid. IMPRESSION: 1. No acute intracranial abnormality. No significant scalp swelling or hematoma. No calvarial fracture. 2. Background of microvascular angiopathy and diffuse parenchymal loss. 3. No acute fracture or traumatic listhesis of the cervical spine. 4. Possible cervical spondylitic changes; most pronounced about the atlantodental and basion dens interval as well as at the C3-4 level there is mild canal stenosis and moderate bilateral foraminal narrowing. 5. Progressive scarring and interstitial reticular changes in lung apices. Incompletely assessed on this exam. Could consider outpatient HRCT for further interrogation  as warranted. 6. Intracranial, cervical carotid and aortic atherosclerosis. Electronically Signed   By: Lovena Le M.D.   On: 03/18/2021 21:47    DG FEMUR, MIN 2 VIEWS RIGHT  Result Date: 03/18/2021 CLINICAL DATA:  Fall with femur deformity EXAM: RIGHT FEMUR 2 VIEWS COMPARISON:  None. FINDINGS: Acute comminuted and displaced right intertrochanteric femur fracture without femoral head dislocation. Vascular calcifications. Mid to distal femur appears intact IMPRESSION: Acute comminuted and displaced right intertrochanteric fracture Electronically Signed   By: Donavan Foil M.D.   On: 03/18/2021 22:01        Scheduled Meds:  chlorhexidine  60 mL Topical Once   finasteride  5 mg Oral Daily   hydrocortisone sod succinate (SOLU-CORTEF) inj  50 mg Intravenous Once   Followed by   hydrocortisone sod succinate (SOLU-CORTEF) inj  25 mg Intravenous Q8H   insulin aspart  0-6 Units Subcutaneous Q4H   pantoprazole  40 mg Oral Daily   povidone-iodine  2 application Topical Once   predniSONE  20 mg Oral Q breakfast   pyridostigmine  60 mg Oral 4 times per day   sodium zirconium cyclosilicate  10 g Oral Once   Continuous Infusions:  sodium chloride 100 mL/hr at 03/19/21 0805    ceFAZolin (ANCEF) IV     methocarbamol (ROBAXIN) IV     tranexamic acid       LOS: 1 day    Time spent: 35 mins.More than 50% of that time was spent in counseling and/or coordination of care.      Shelly Coss, MD [facility] P7/10/2020, 8:14 AM

## 2021-03-19 NOTE — ED Notes (Signed)
Report given to Tanna Furry, RN

## 2021-03-19 NOTE — ED Provider Notes (Signed)
[facility] EMERGENCY DEPARTMENT Provider Note   CSN: [id number] Arrival date & time: 03/18/21  1901     History Chief Complaint  Patient presents with   Lytle Michaels    KAHLE MCQUEEN is a 87 y.o. male.  87 year old male with past medical Stre[place] including Alzheimer's dementia, CAD, hypertension, solitary kidney who presents with fall and right hip pain.  Patient presents from his nursing facility where just prior to arrival he had an unwitnessed fall.  He states that he got tripped up with his walker and landed on his right hip.  He reports pain in his right upper thigh that is currently mild.  He sustained several skin tears.  He denies any other areas of pain.  No anticoagulant use.  Daughter states that he has been in usual state of health recently with no vomiting or cough/cold symptoms.  He received 100 mcg fentanyl in route by EMS.  LEVEL 5 CAVEAT DUE TO DEMENTIA  The history is provided by the patient and a relative.  Fall      Past Medical History:  Diagnosis Date   Carotid artery disease ([place])    Coronary artery disease    PCI RCA 2009   Dyslipidemia    Hypertension    Scleritis, unspecified    treated with prednisone   Seizure ([place])    felt due to cyclosporin in setting of electrolyte disorder   Solitary kidney    Symptomatic bradycardia    a. s/p STJ dual chamber pacemaker    Patient Active Problem List   Diagnosis Date Noted   Hyperglycemia 03/19/2021   Closed right hip fracture, initial encounter ([place]) 03/18/2021   Renal insufficiency 03/18/2021   Hyponatremia 03/18/2021   Late onset Alzheimer's disease with behavioral disturbance ([place]) 11/02/2020   Benign prostatic hyperplasia with urinary frequency 08/27/2020   Myasthenia gravis ([place]) 08/27/2020   AMS (altered mental status) 62/13/0865   Toxic metabolic encephalopathy 78/46/9629   Stroke-like symptom 05/12/2020   Leukocytosis 05/12/2020   Macrocytosis 05/12/2020   Stroke-like symptoms  05/12/2020   Closed fracture of right distal humerus 07/03/2019   Carotid artery disease ([place]) 09/09/2018   Pacemaker-[employer] 07/03/2012   Bruit 09/28/2011   Tongue lesion 08/15/2011   Essential hypertension 06/23/2010   Mixed hyperlipidemia 09/22/2008   Coronary artery disease involving native coronary artery of native heart without angina pectoris 09/22/2008   Edema 09/22/2008   SSS (sick sinus syndrome) ([place]) 09/22/2008    Past Surgical History:  Procedure Laterality Date   CARDIAC CATHETERIZATION     12 years ago?   COLONOSCOPY     EYE SURGERY     right eye about 10 years ago   ORIF HUMERUS FRACTURE Right 07/03/2019   Procedure: Right distal humerus open reduction, internal fixation;  Surgeon: Verner Mould, MD;  Location: [facility] [place];  Service: Orthopedics;  Laterality: Right;   PACEMAKER INSERTION  2009   STJ dual chamber pacemaker implanted by Dr Olevia Perches for symptomatic bradycardia       Family History  Problem Relation Age of Onset   Arthritis/Rheumatoid Mother    Heart disease Mother    COPD Father    Alcohol abuse Son     Social History   Tobacco Use   Smoking status: Never   Smokeless tobacco: Never  Vaping Use   Vaping Use: Never used  Substance Use Topics   Alcohol use: Yes    Comment: rarely   Drug use: No  Home Medications Prior to Admission medications   Medication Sig Start Date End Date Taking? Authorizing Provider  amLODipine (NORVASC) 2.5 MG tablet Take 2.5 mg by mouth every morning.   Yes [provider]  aspirin 81 MG chewable tablet Chew 81 mg by mouth every morning.   Yes [provider]  carvedilol (COREG) 6.25 MG tablet Take 6.25 mg by mouth 2 (two) times daily.   Yes [provider]  Ensure (ENSURE) Take 237 mLs by mouth daily as needed (meal refusal).   Yes [provider]  Neomycin-Bacitracin-Polymyxin (TRIPLE ANTIBIOTIC) OINT Apply 1 application topically See admin instructions. Apply small  amount of triple antibiotic ointment to non-stick pad and cover wound, wrap with coban until healed   Yes [provider]  pyridostigmine (MESTINON) 60 MG tablet Take 1 tablet at 7am, 10am 1pm, and 5pm Patient taking differently: Take 60 mg by mouth See admin instructions. Take one tablet (60 mg) by mouth four times daily - 6:30am, 10am, 1pm and 5pm 07/16/20  Yes Patel, Donika K, DO  spironolactone (ALDACTONE) 25 MG tablet Take 0.5 tablets (12.5 mg total) by mouth daily. Patient taking differently: Take 25 mg by mouth 2 (two) times daily with a meal. 07/19/20  Yes Fay Records, MD  amLODipine (NORVASC) 5 MG tablet Take 1 tablet by mouth once daily Patient not taking: Reported on 03/18/2021 07/19/20   Fay Records, MD  finasteride (PROSCAR) 5 MG tablet Take 5 mg by mouth daily.    [provider]  GAMMAGARD 5 GM/50ML SOLN every 21 ( twenty-one) days. 08/24/20   [provider]  LORazepam (ATIVAN) 1 MG tablet Take 1 mg by mouth at bedtime.    [provider]  NITROSTAT 0.4 MG SL tablet DISSOLVE ONE TABLET UNDER THE TONGUE EVERY 5 MINUTES AS NEEDED FOR CHEST PAIN.  DO NOT EXCEED A TOTAL OF 3 DOSES IN 15 MINUTES Patient taking differently: Place 0.4 mg under the tongue every 5 (five) minutes x 3 doses as needed for chest pain. 11/19/18   Fay Records, MD  pantoprazole (PROTONIX) 40 MG tablet Take 1 tablet (40 mg total) by mouth daily. Patient taking differently: Take 40 mg by mouth daily before breakfast. 08/06/20   Narda Amber K, DO  predniSONE (DELTASONE) 20 MG tablet Take 1 tablet (20 mg total) by mouth daily with breakfast. 08/16/20   Narda Amber K, DO  QUEtiapine (SEROQUEL) 25 MG tablet Take 1 tablet (25 mg total) by mouth at bedtime as needed. 08/31/20   Pahwani, Michell Heinrich, MD  risperiDONE (RISPERDAL) 0.5 MG tablet Take 0.5 mg by mouth at bedtime.    [provider]    Allergies    Cefuroxime axetil, Cyclosporine, Elemental sulfur, Maxzide  [triamterene-hctz], Sulfa antibiotics, Sulfamethoxazole, Sulfamethoxazole-trimethoprim, and Prednisone  Review of Systems   Review of Systems  Unable to perform ROS: Dementia   Physical Exam Updated Vital Signs BP 105/63   Pulse (!) 101   Temp (!) 97.5 F (36.4 C) (Oral)   Resp (!) 30   SpO2 97%   Physical Exam Vitals and nursing note reviewed.  Constitutional:      General: He is not in acute distress.    Appearance: Normal appearance.  HENT:     Head: Normocephalic and atraumatic.  Eyes:     Conjunctiva/sclera: Conjunctivae normal.     Pupils: Pupils are equal, round, and reactive to light.  Cardiovascular:     Rate and Rhythm: Normal rate and regular  rhythm.     Pulses: Normal pulses.     Heart sounds: Normal heart sounds. No murmur heard. Pulmonary:     Effort: Pulmonary effort is normal.     Breath sounds: Normal breath sounds.  Abdominal:     General: Abdomen is flat. Bowel sounds are normal. There is no distension.     Palpations: Abdomen is soft.     Tenderness: There is no abdominal tenderness.  Musculoskeletal:        General: Swelling and tenderness present.     Right lower leg: No edema.     Left lower leg: No edema.     Comments: Swelling and tenderness R medial thigh with leg externally rotated  Skin:    General: Skin is warm and dry.     Comments: Multiple skin tears on arms and L leg  Neurological:     Mental Status: He is alert.     Comments: Fluent speech, oriented x 1  Psychiatric:        Mood and Affect: Mood normal.    ED Results / Procedures / Treatments   Labs (all labs ordered are listed, but only abnormal results are displayed) Labs Reviewed  BASIC METABOLIC PANEL - Abnormal; Notable for the following components:      Result Value   Sodium 130 (*)    CO2 21 (*)    Glucose, Bld 170 (*)    BUN 42 (*)    Creatinine, Ser 1.61 (*)    GFR, Estimated 41 (*)    All other components within normal limits  CBC WITH DIFFERENTIAL/PLATELET -  Abnormal; Notable for the following components:   WBC 21.1 (*)    Neutro Abs 18.0 (*)    Monocytes Absolute 1.9 (*)    Abs Immature Granulocytes 0.24 (*)    All other components within normal limits  RESP PANEL BY RT-PCR (FLU A&B, COVID) ARPGX2  URINE CULTURE  URINALYSIS, ROUTINE W REFLEX MICROSCOPIC  HEMOGLOBIN A1C    EKG None  Radiology DG Chest 1 View  Result Date: 03/18/2021 CLINICAL DATA:  Fall with deformity EXAM: CHEST  1 VIEW COMPARISON:  08/26/2020, 05/12/2020, 04/09/2020 FINDINGS: Apical pleural thickening. Left-sided pacing device as before. Bilateral peripheral and lower lung reticular opacity consistent with interstitial fibrosis and chronic lung disease. No definite acute superimposed airspace disease, pleural effusion, or pneumothorax. Stable cardiomediastinal silhouette with aortic atherosclerosis. IMPRESSION: Chronic interstitial lung disease without definite acute interval change since 08/26/2020. Electronically Signed   By: Donavan Foil M.D.   On: 03/18/2021 21:58   DG Pelvis 1-2 Views  Result Date: 03/18/2021 CLINICAL DATA:  Fall with right femur deformity EXAM: PELVIS - 1-2 VIEW COMPARISON:  None. FINDINGS: Pubic symphysis and rami appear intact. Both femoral heads project in joint. SI joints are non widened. Acute comminuted right intertrochanteric fracture with displaced lesser trochanteric fracture fragments. IMPRESSION: Acute comminuted and displaced right intertrochanteric fracture Electronically Signed   By: Donavan Foil M.D.   On: 03/18/2021 22:00   DG Tibia/Fibula Right  Result Date: 03/18/2021 CLINICAL DATA:  Fall with right femur deformity EXAM: RIGHT TIBIA AND FIBULA - 2 VIEW COMPARISON:  None. FINDINGS: There is no evidence of fracture or other focal bone lesions. Soft tissues are unremarkable. IMPRESSION: Negative. Electronically Signed   By: Donavan Foil M.D.   On: 03/18/2021 21:59   CT Head Wo Contrast  Result Date: 03/18/2021 CLINICAL DATA:  Fall  EXAM: CT HEAD WITHOUT CONTRAST CT CERVICAL SPINE WITHOUT CONTRAST TECHNIQUE:  Multidetector CT imaging of the head and cervical spine was performed following the standard protocol without intravenous contrast. Multiplanar CT image reconstructions of the cervical spine were also generated. COMPARISON:  CT head 08/26/2020, cervical spine radiograph 05/13/2020, CT cervical spine 03/03/2007 FINDINGS: CT HEAD FINDINGS Brain: Mild streak artifact across the posterior fossa from dental amalgam. No evidence of acute infarction, hemorrhage, hydrocephalus, extra-axial collection, visible mass lesion or mass effect. Symmetric prominence of the ventricles, cisterns and sulci compatible with parenchymal volume loss. Patchy areas of white matter hypoattenuation are most compatible with chronic microvascular angiopathy. Vascular: Atherosclerotic calcification of the carotid siphons and intradural vertebral arteries. No hyperdense vessel. Skull: No significant scalp swelling or hematoma. No calvarial fracture or focal osseous abnormality. Sinuses/Orbits: Paranasal sinuses and mastoid air cells are predominantly clear. Middle ear cavities are clear. Debris in the right external auditory canal. Orbital structures are unremarkable aside from prior lens extractions. Other: Bilateral TMJ arthrosis. CT CERVICAL SPINE FINDINGS Alignment: Cervical stabilization collar in place at the time exam. Preservation of the normal cervical lordosis. Levocurvature of the cervical spine with dextrocurvature of the imaged thoracic levels. Some of which may be positional. No evidence of traumatic listhesis. No abnormally widened, perched or jumped facets. Normal alignment of the craniocervical and atlantoaxial articulations accounting for mild rightward cranial rotation. Skull base and vertebrae: No acute skull base fracture. No vertebral body fracture or height loss. Normal bone mineralization. No worrisome osseous lesions. Arthrosis about the  atlantodental and basion dens intervals. Additional multilevel cervical spondylitic changes, further detailed below. Soft tissues and spinal canal: No pre or paravertebral fluid or swelling. No visible canal hematoma. Airways patent. No concerning adenopathy. Calcifications proximal great vessels and aortic arch as well as the cervical carotids. Partial visualization a left chest wall pacer pack and subclavian leads. Disc levels: Multilevel intervertebral disc height loss with spondylitic endplate changes. Some multilevel disc osteophyte complexes partially efface the ventral thecal sac with more pronounced findings at the C3-4, C4-5 level where this results in at least mild canal stenosis. Multilevel uncinate spurring facet hypertrophic changes result in mild multilevel neural foraminal narrowing with more moderate narrowing at C3-4 as well. Upper chest: Progressive biapical pleuroparenchymal scarring and reticular interstitial changes, incompletely assessed on this exam. Consolidative process or convincing edema. No acute traumatic findings in the upper chest. Other: Normal thyroid. IMPRESSION: 1. No acute intracranial abnormality. No significant scalp swelling or hematoma. No calvarial fracture. 2. Background of microvascular angiopathy and diffuse parenchymal loss. 3. No acute fracture or traumatic listhesis of the cervical spine. 4. Possible cervical spondylitic changes; most pronounced about the atlantodental and basion dens interval as well as at the C3-4 level there is mild canal stenosis and moderate bilateral foraminal narrowing. 5. Progressive scarring and interstitial reticular changes in lung apices. Incompletely assessed on this exam. Could consider outpatient HRCT for further interrogation as warranted. 6. Intracranial, cervical carotid and aortic atherosclerosis. Electronically Signed   By: Lovena Le M.D.   On: 03/18/2021 21:47   CT Cervical Spine Wo Contrast  Result Date: 03/18/2021 CLINICAL  DATA:  Fall EXAM: CT HEAD WITHOUT CONTRAST CT CERVICAL SPINE WITHOUT CONTRAST TECHNIQUE: Multidetector CT imaging of the head and cervical spine was performed following the standard protocol without intravenous contrast. Multiplanar CT image reconstructions of the cervical spine were also generated. COMPARISON:  CT head 08/26/2020, cervical spine radiograph 05/13/2020, CT cervical spine 03/03/2007 FINDINGS: CT HEAD FINDINGS Brain: Mild streak artifact across the posterior fossa from dental amalgam. No evidence of  acute infarction, hemorrhage, hydrocephalus, extra-axial collection, visible mass lesion or mass effect. Symmetric prominence of the ventricles, cisterns and sulci compatible with parenchymal volume loss. Patchy areas of white matter hypoattenuation are most compatible with chronic microvascular angiopathy. Vascular: Atherosclerotic calcification of the carotid siphons and intradural vertebral arteries. No hyperdense vessel. Skull: No significant scalp swelling or hematoma. No calvarial fracture or focal osseous abnormality. Sinuses/Orbits: Paranasal sinuses and mastoid air cells are predominantly clear. Middle ear cavities are clear. Debris in the right external auditory canal. Orbital structures are unremarkable aside from prior lens extractions. Other: Bilateral TMJ arthrosis. CT CERVICAL SPINE FINDINGS Alignment: Cervical stabilization collar in place at the time exam. Preservation of the normal cervical lordosis. Levocurvature of the cervical spine with dextrocurvature of the imaged thoracic levels. Some of which may be positional. No evidence of traumatic listhesis. No abnormally widened, perched or jumped facets. Normal alignment of the craniocervical and atlantoaxial articulations accounting for mild rightward cranial rotation. Skull base and vertebrae: No acute skull base fracture. No vertebral body fracture or height loss. Normal bone mineralization. No worrisome osseous lesions. Arthrosis about  the atlantodental and basion dens intervals. Additional multilevel cervical spondylitic changes, further detailed below. Soft tissues and spinal canal: No pre or paravertebral fluid or swelling. No visible canal hematoma. Airways patent. No concerning adenopathy. Calcifications proximal great vessels and aortic arch as well as the cervical carotids. Partial visualization a left chest wall pacer pack and subclavian leads. Disc levels: Multilevel intervertebral disc height loss with spondylitic endplate changes. Some multilevel disc osteophyte complexes partially efface the ventral thecal sac with more pronounced findings at the C3-4, C4-5 level where this results in at least mild canal stenosis. Multilevel uncinate spurring facet hypertrophic changes result in mild multilevel neural foraminal narrowing with more moderate narrowing at C3-4 as well. Upper chest: Progressive biapical pleuroparenchymal scarring and reticular interstitial changes, incompletely assessed on this exam. Consolidative process or convincing edema. No acute traumatic findings in the upper chest. Other: Normal thyroid. IMPRESSION: 1. No acute intracranial abnormality. No significant scalp swelling or hematoma. No calvarial fracture. 2. Background of microvascular angiopathy and diffuse parenchymal loss. 3. No acute fracture or traumatic listhesis of the cervical spine. 4. Possible cervical spondylitic changes; most pronounced about the atlantodental and basion dens interval as well as at the C3-4 level there is mild canal stenosis and moderate bilateral foraminal narrowing. 5. Progressive scarring and interstitial reticular changes in lung apices. Incompletely assessed on this exam. Could consider outpatient HRCT for further interrogation as warranted. 6. Intracranial, cervical carotid and aortic atherosclerosis. Electronically Signed   By: Lovena Le M.D.   On: 03/18/2021 21:47   DG FEMUR, MIN 2 VIEWS RIGHT  Result Date: 03/18/2021 CLINICAL  DATA:  Fall with femur deformity EXAM: RIGHT FEMUR 2 VIEWS COMPARISON:  None. FINDINGS: Acute comminuted and displaced right intertrochanteric femur fracture without femoral head dislocation. Vascular calcifications. Mid to distal femur appears intact IMPRESSION: Acute comminuted and displaced right intertrochanteric fracture Electronically Signed   By: Donavan Foil M.D.   On: 03/18/2021 22:01    Procedures Procedures   Medications Ordered in ED Medications  LORazepam (ATIVAN) tablet 0.5 mg (has no administration in time range)  predniSONE (DELTASONE) tablet 20 mg (has no administration in time range)  finasteride (PROSCAR) tablet 5 mg (has no administration in time range)  pyridostigmine (MESTINON) tablet 60 mg (has no administration in time range)  senna-docusate (Senokot-S) tablet 1 tablet (has no administration in time range)  methocarbamol (ROBAXIN) tablet  500 mg (has no administration in time range)    Or  methocarbamol (ROBAXIN) 500 mg in dextrose 5 % 50 mL IVPB (has no administration in time range)  fentaNYL (SUBLIMAZE) injection 25-50 mcg (has no administration in time range)  ondansetron (ZOFRAN) injection 4 mg (has no administration in time range)  0.9 %  sodium chloride infusion (has no administration in time range)  pantoprazole (PROTONIX) EC tablet 40 mg (has no administration in time range)  insulin aspart (novoLOG) injection 0-6 Units (has no administration in time range)  fentaNYL (SUBLIMAZE) injection 50 mcg (50 mcg Intravenous Given 03/18/21 2231)  fentaNYL (SUBLIMAZE) injection 100 mcg (100 mcg Intravenous Given 03/19/21 0009)  sodium chloride 0.9 % bolus 500 mL (500 mLs Intravenous New Bag/Given 03/19/21 0010)    ED Course  I have reviewed the triage vital signs and the nursing notes.  Pertinent labs & imaging results that were available during my care of the patient were reviewed by me and considered in my medical decision making (see chart for details).    MDM  Rules/Calculators/A&P                          Alert, no acute distress on exam, neurovascularly intact distally.  Reassuring vital signs.  CT head and cervical spine are negative. XR show R intertroch femur fracture. Discussed w/ ortho, Dr. Stann Mainland, who tentatively plans to repair tomorrow. NPO at [place]. Labs show WBC 21; UA without infection, CXR clear, family denies infectious symptoms. Discussed admission w/ [facility], Dr. Myna Hidalgo.  Final Clinical Impression(s) / ED Diagnoses Final diagnoses:  Closed displaced intertrochanteric fracture of right femur, initial encounter [employer])    Rx / [place] Orders ED Discharge Orders     None        Adleigh Mcmasters, Wenda Overland, MD 03/19/21 [phone]

## 2021-03-19 NOTE — ED Notes (Signed)
Update given to pts daughter, robin

## 2021-03-19 NOTE — Progress Notes (Signed)
Initial Nutrition Assessment  DOCUMENTATION CODES:   Not applicable  INTERVENTION:   -Once diet is advanced, add:  -Glucerna Shake po TID, each supplement provides 220 kcal and 10 grams of protein  -MVI with minerals daily  NUTRITION DIAGNOSIS:   Increased nutrient needs related to post-op healing as evidenced by estimated needs.  GOAL:   Patient will meet greater than or equal to 90% of their needs  MONITOR:   PO intake, Supplement acceptance, Diet advancement, Labs, Weight trends, Skin, I & O's  REASON FOR ASSESSMENT:   Consult Assessment of nutrition requirement/status, Hip fracture protocol  ASSESSMENT:   [name] is a 87 y.o. male with medical history significant for dementia, hypertension, sick sinus syndrome with pacer, myasthenia gravis, coronary artery disease, now presenting to the emergency department with right hip pain after fall.  He is accompanied by his daughter who assists with the history.  Patient had been in his usual state of health and was ambulating in his room with a walker when he fell and was complaining of severe right leg pain.  He denies hitting his head or losing consciousness, denies chest pain, and denies shortness of breath or cough.  He has not had any recent illness or respiratory issues per family report.  He was treated with fentanyl prior to arrival in the ED.  Pt admitted with rt hip fracture.   7/2- s/p PROCEDURE: Treatment of intertrochanteric, pertrochanteric, subtrochanteric fracture with intramedullary implant  Reviewed I/O's: +500 ml x 24 hours  UOP: 125 ml x 24 hours  Pt unavailable at time of visit. Pt in OR at time of visit. RD unable to obtain further nutrition-related history or complete nutrition-focused physical exam at this time.    Medications reviewed and include solu-medrol, lokelma, prednisone, lactated ringers infusion @ 10 ml/hr, and 0.9% sodium chloride infusion @ 100 ml/hr.   Reviewed wt hx; wt has been  stable over the past year.   Pt with increased nutritional needs fir post-op healing and would benefit from addition of oral nutrition supplements.   Lab Results  Component Value Date   HGBA1C 5.9 (H) 03/19/2021   PTA DM medications are none.   Labs reviewed: Na: 132, K: 5.4, CBGS: 161-185 (inpatient orders for glycemic control are 0-6 units insulin aspart every 4 hours).    Diet Order:   Diet Order             Diet NPO time specified  Diet effective midnight                   EDUCATION NEEDS:   No education needs have been identified at this time  Skin:  Skin Assessment: Skin Integrity Issues: Skin Integrity Issues:: Incisions Incisions: closed rt thigh  Last BM:  Unknown  Height:   Ht Readings from Last 1 Encounters:  03/19/21 5\' 7"  (1.702 m)    Weight:   Wt Readings from Last 1 Encounters:  03/19/21 73.5 kg    Ideal Body Weight:  67.3 kg  BMI:  Body mass index is 25.37 kg/m.  Estimated Nutritional Needs:   Kcal:  1850-2050  Protein:  95-110 grams  Fluid:  > 1.8 L    Loistine Chance, RD, LDN, [place] Registered Dietitian II Certified Diabetes Care and Education Specialist Please refer to [facility] for RD and/or RD on-call/weekend/after hours pager

## 2021-03-19 NOTE — Op Note (Signed)
Date of Surgery: 03/19/2021  INDICATIONS: Mr. Montalto is a 87 y.o.-year-old male who sustained a right hip fracture. The risks and benefits of the procedure discussed with the patient, family, and Patient prior to the procedure and all questions were answered; consent was obtained.  PREOPERATIVE DIAGNOSIS: right hip fracture   POSTOPERATIVE DIAGNOSIS: Same   PROCEDURE: Treatment of intertrochanteric, pertrochanteric, subtrochanteric fracture with intramedullary implant. CPT [phone]   SURGEON: Dannielle Karvonen. Stann Mainland, M.D.   Assistant: Jonelle Sidle, PA-C  Assistant attestation: PA Mcclung was present for the entire procedure.  He was critical for positioning, prepping and draping, reduction of fracture and instrumentation of the femur.  Present for closure and transport to PACU.  ANESTHESIA: general   IV FLUIDS AND URINE: See anesthesia record   ESTIMATED BLOOD LOSS: 100 cc  IMPLANTS:   Synthes TFN a 11 x 235 mm 125 mm proximal compression screw 5.0 x 42 mm distal interlock  DRAINS: None.   COMPLICATIONS: None.   DESCRIPTION OF PROCEDURE: The patient was brought to the operating room and placed supine on the operating table. The patient's leg had been signed prior to the procedure. The patient had the anesthesia placed by the anesthesiologist. The prep verification and incision time-outs were performed to confirm that this was the correct patient, site, side and location. The patient had an SCD on the opposite lower extremity. The patient did receive antibiotics prior to the incision and was re-dosed during the procedure as needed at indicated intervals. The patient was positioned on the fracture table with the table in traction and internal rotation to reduce the hip. The well leg was placed in a scissor position and all bony prominences were well-padded. The patient had the lower extremity prepped and draped in the standard surgical fashion. The incision was made 4 finger breadths superior  to the greater trochanter. A guide pin was inserted into the tip of the greater trochanter under fluoroscopic guidance. An opening reamer was used to gain access to the femoral canal. The nail length was measured and inserted down the femoral canal to its proper depth. The appropriate version of insertion for the lag screw was found under fluoroscopy. A pin was inserted up the femoral neck through the jig.  A percutaneous incision was made such that a Cobb could be used to reduce the flexion deformity of the femoral neck.  The length of the lag screw was then measured. The lag screw was inserted as near to center-center in the head as possible. The leg was taken out of traction, then the compression screw was used to compress across the fracture. Compression was visualized on serial xrays.   We next turned our attention to the distal interlocking screw.  This was placed through the drill guide of the nail inserter.  A small incision was made overlying the lateral thigh at the screw site, and a tonsil was used to disect down to bone.  A drill pass was made through the jig and across the nail through both cortices.  This was measured, and the appropriate screw was placed under hand power and found to have good bite.    The wound was copiously irrigated with saline and the subcutaneous layer closed with 2.0 vicryl and the skin was reapproximated with staples. The wounds were cleaned and dried a final time and a sterile dressing was placed. The hip was taken through a range of motion at the end of the case under fluoroscopic imaging to visualize the  approach-withdraw phenomenon and confirm implant length in the head. The patient was then awakened from anesthesia and taken to the recovery room in stable condition. All counts were correct at the end of the case.   POSTOPERATIVE PLAN: The patient will be weight bearing as tolerated and will return in 2 weeks for staple removal and the patient will receive DVT  prophylaxis based on other medications, activity level, and risk ratio of bleeding to thrombosis.     Geralynn Rile, MD [facility] [place] [phone] 10:38 AM

## 2021-03-19 NOTE — Anesthesia Preprocedure Evaluation (Addendum)
Anesthesia Evaluation  Patient identified by MRN, date of birth, ID band Patient confused    Reviewed: Allergy & Precautions, NPO status , Patient's Chart, lab work & pertinent test results  Airway Mallampati: II  TM Distance: >3 FB Neck ROM: Full    Dental  (+) Teeth Intact   Pulmonary neg pulmonary ROS,    Pulmonary exam normal        Cardiovascular hypertension, Pt. on medications and Pt. on home beta blockers + CAD and + Cardiac Stents (2009)  + pacemaker  Rhythm:Regular Rate:Normal     Neuro/Psych Seizures -, Well Controlled,  Dementia  Neuromuscular disease (MG)    GI/Hepatic Neg liver ROS, GERD  Medicated,  Endo/Other  negative endocrine ROS  Renal/GU   negative genitourinary   Musculoskeletal Right trochanteric fx   Abdominal (+)  Abdomen: soft.    Peds  Hematology negative hematology ROS (+)   Anesthesia Other Findings   Reproductive/Obstetrics                            Anesthesia Physical Anesthesia Plan  ASA: 3  Anesthesia Plan: General   Post-op Pain Management:    Induction: Intravenous  PONV Risk Score and Plan: 2 and Ondansetron, Dexamethasone and Treatment may vary due to age or medical condition  Airway Management Planned: Mask and LMA  Additional Equipment: None  Intra-op Plan:   Post-operative Plan: Extubation in OR  Informed Consent: I have reviewed the patients History and Physical, chart, labs and discussed the procedure including the risks, benefits and alternatives for the proposed anesthesia with the patient or authorized representative who has indicated his/her understanding and acceptance.     Dental advisory given and Consent reviewed with POA  Plan Discussed with: CRNA  Anesthesia Plan Comments: (- Spoke with daughter DOS. Patient's MG well controlled with IVIG therapy, cleared for solids by SLP without aspiration concerns. He resides in  memory unit of full time care facility.  Lab Results      Component                Value               Date                      WBC                      20.9 (H)            03/19/2021                HGB                      12.9 (L)            03/19/2021                HCT                      39.5                03/19/2021                MCV                      99.7                03/19/2021  PLT                      187                 03/19/2021           Lab Results      Component                Value               Date                      NA                       132 (L)             03/19/2021                K                        5.4 (H)             03/19/2021                CO2                      22                  03/19/2021                GLUCOSE                  171 (H)             03/19/2021                BUN                      47 (H)              03/19/2021                CREATININE               1.97 (H)            03/19/2021                CALCIUM                  8.7 (L)             03/19/2021                GFRNONAA                 32 (L)              03/19/2021                GFRAA                    >60                 05/14/2020           Lab Results      Component                Value  Date                      NA                       132 (L)             03/19/2021                K                        5.4 (H)             03/19/2021                CO2                      22                  03/19/2021                GLUCOSE                  171 (H)             03/19/2021                BUN                      47 (H)              03/19/2021                CREATININE               1.97 (H)            03/19/2021                CALCIUM                  8.7 (L)             03/19/2021                GFRNONAA                 32 (L)              03/19/2021                GFRAA                    >60                 05/14/2020           )       Anesthesia Quick Evaluation

## 2021-03-19 NOTE — Anesthesia Procedure Notes (Signed)
Procedure Name: LMA Insertion Date/Time: 03/19/2021 9:18 AM Performed by: Dorthea Cove, CRNA Pre-anesthesia Checklist: Patient identified, Emergency Drugs available, Suction available and Patient being monitored Patient Re-evaluated:Patient Re-evaluated prior to induction Oxygen Delivery Method: Circle System Utilized Preoxygenation: Pre-oxygenation with 100% oxygen Induction Type: IV induction Ventilation: Mask ventilation without difficulty LMA: LMA inserted LMA Size: 4.0 Number of attempts: 1 Airway Equipment and Method: Bite block Placement Confirmation: positive ETCO2 Tube secured with: Tape Dental Injury: Teeth and Oropharynx as per pre-operative assessment

## 2021-03-19 NOTE — Brief Op Note (Signed)
03/18/2021 - 03/19/2021  10:21 AM  PATIENT:  [name]  87 y.o. male  PRE-OPERATIVE DIAGNOSIS:  RIGHT HIP FRACTURE  POST-OPERATIVE DIAGNOSIS:  RIGHT HIP FRACTURE  PROCEDURE:  Procedure(s): INTRAMEDULLARY (IM) NAIL ANTERIOR TROCHANTRIC (Right)  SURGEON:  Surgeon(s) and Role:    * Treavor Blomquist, Elly Modena, MD - Primary  PHYSICIAN ASSISTANT: Jonelle Sidle, PA-C  ANESTHESIA:   general  EBL:  25 mL   BLOOD ADMINISTERED:none  DRAINS: none   LOCAL MEDICATIONS USED:  NONE  SPECIMEN:  No Specimen  DISPOSITION OF SPECIMEN:  N/A  COUNTS:  YES  TOURNIQUET:  * No tourniquets in log *  DICTATION: .Note written in EPIC  PLAN OF CARE: Discharge to home after PACU  PATIENT DISPOSITION:  PACU - hemodynamically stable.   Delay start of Pharmacological VTE agent (>24hrs) due to surgical blood loss or risk of bleeding: not applicable

## 2021-03-19 NOTE — Consult Note (Signed)
ORTHOPAEDIC CONSULTATION  REQUESTING PHYSICIAN: Shelly Coss, MD  PCP:  Shirline Frees, MD  Chief Complaint: Right Hip Pain   HPI: [name] is a 87 y.o. male who complains of right hip pain after a fall from his nursing facility.  Patient had an unwitnessed fall.  Patient reported that he was tripped up with his walker and landed directly on his right hip.  Patient was brought to the ER via EMS.  Patient has a past medical history of Alzheimer's dementia, CAD, hypertension. Patient reports pain is mild to moderate. Denies numbness or tingling. Denies fever or chills.   Past Medical History:  Diagnosis Date   Carotid artery disease ([place])    Coronary artery disease    PCI RCA 2009   Dyslipidemia    Hypertension    Scleritis, unspecified    treated with prednisone   Seizure ([place])    felt due to cyclosporin in setting of electrolyte disorder   Solitary kidney    Symptomatic bradycardia    a. s/p STJ dual chamber pacemaker   Past Surgical History:  Procedure Laterality Date   CARDIAC CATHETERIZATION     12 years ago?   COLONOSCOPY     EYE SURGERY     right eye about 10 years ago   ORIF HUMERUS FRACTURE Right 07/03/2019   Procedure: Right distal humerus open reduction, internal fixation;  Surgeon: Verner Mould, MD;  Location: [facility];  Service: Orthopedics;  Laterality: Right;   PACEMAKER INSERTION  2009   STJ dual chamber pacemaker implanted by Dr Olevia Perches for symptomatic bradycardia   Social History   Socioeconomic History   Marital status: Widowed    Spouse name: Not on file   Number of children: 2   Years of education: college   Highest education level: Not on file  Occupational History   Occupation: retired  Tobacco Use   Smoking status: Never   Smokeless tobacco: Never  Vaping Use   Vaping Use: Never used  Substance and Sexual Activity   Alcohol use: Yes    Comment: rarely   Drug use: No   Sexual activity: Not on file  Other Topics Concern    Not on file  Social History Narrative   Married   Tobacco   etoh   Right Handed   Lives in a one story home   Drinks half a cup of coffee a day    Social Determinants of Health   Financial Resource Strain: Not on file  Food Insecurity: Not on file  Transportation Needs: Not on file  Physical Activity: Not on file  Stress: Not on file  Social Connections: Not on file   Family History  Problem Relation Age of Onset   Arthritis/Rheumatoid Mother    Heart disease Mother    COPD Father    Alcohol abuse Son    Allergies  Allergen Reactions   Cefuroxime Axetil Other (See Comments)    Reaction not recalled   Cyclosporine Other (See Comments)    Reaction not recalled   Elemental Sulfur Other (See Comments)    No energy, could not walk    Maxzide [Triamterene-Hctz] Other (See Comments)    Adverse reaction- bloating and abdominal pain   Sulfa Antibiotics Other (See Comments)    Could not sleep/eat and experienced gastric pain   Sulfamethoxazole Other (See Comments)    Could not sleep/eat and had gastric pain   Sulfamethoxazole-Trimethoprim Other (See Comments)    Gastric pain  and could not eat/sleep   Prednisone Other (See Comments)    Can tolerate for a short period of time   Prior to Admission medications   Medication Sig Start Date End Date Taking? Authorizing Provider  amLODipine (NORVASC) 2.5 MG tablet Take 2.5 mg by mouth every morning.   Yes [provider]  aspirin 81 MG chewable tablet Chew 81 mg by mouth every morning.   Yes [provider]  carvedilol (COREG) 6.25 MG tablet Take 6.25 mg by mouth 2 (two) times daily.   Yes [provider]  Ensure (ENSURE) Take 237 mLs by mouth daily as needed (meal refusal).   Yes [provider]  Neomycin-Bacitracin-Polymyxin (TRIPLE ANTIBIOTIC) OINT Apply 1 application topically See admin instructions. Apply small amount of triple antibiotic ointment to non-stick pad and cover wound, wrap with  coban until healed   Yes [provider]  pyridostigmine (MESTINON) 60 MG tablet Take 1 tablet at 7am, 10am 1pm, and 5pm Patient taking differently: Take 60 mg by mouth See admin instructions. Take one tablet (60 mg) by mouth four times daily - 6:30am, 10am, 1pm and 5pm 07/16/20  Yes Patel, Donika K, DO  spironolactone (ALDACTONE) 25 MG tablet Take 0.5 tablets (12.5 mg total) by mouth daily. Patient taking differently: Take 25 mg by mouth 2 (two) times daily with a meal. 07/19/20  Yes Fay Records, MD  amLODipine (NORVASC) 5 MG tablet Take 1 tablet by mouth once daily Patient not taking: Reported on 03/18/2021 07/19/20   Fay Records, MD  finasteride (PROSCAR) 5 MG tablet Take 5 mg by mouth daily.    [provider]  GAMMAGARD 5 GM/50ML SOLN every 21 ( twenty-one) days. 08/24/20   [provider]  LORazepam (ATIVAN) 1 MG tablet Take 1 mg by mouth at bedtime.    [provider]  NITROSTAT 0.4 MG SL tablet DISSOLVE ONE TABLET UNDER THE TONGUE EVERY 5 MINUTES AS NEEDED FOR CHEST PAIN.  DO NOT EXCEED A TOTAL OF 3 DOSES IN 15 MINUTES Patient taking differently: Place 0.4 mg under the tongue every 5 (five) minutes x 3 doses as needed for chest pain. 11/19/18   Fay Records, MD  pantoprazole (PROTONIX) 40 MG tablet Take 1 tablet (40 mg total) by mouth daily. Patient taking differently: Take 40 mg by mouth daily before breakfast. 08/06/20   Narda Amber K, DO  predniSONE (DELTASONE) 20 MG tablet Take 1 tablet (20 mg total) by mouth daily with breakfast. 08/16/20   Narda Amber K, DO  QUEtiapine (SEROQUEL) 25 MG tablet Take 1 tablet (25 mg total) by mouth at bedtime as needed. 08/31/20   Pahwani, Michell Heinrich, MD  risperiDONE (RISPERDAL) 0.5 MG tablet Take 0.5 mg by mouth at bedtime.    [provider]   DG Chest 1 View  Result Date: 03/18/2021 CLINICAL DATA:  Fall with deformity EXAM: CHEST  1 VIEW COMPARISON:  08/26/2020, 05/12/2020, 04/09/2020 FINDINGS: Apical  pleural thickening. Left-sided pacing device as before. Bilateral peripheral and lower lung reticular opacity consistent with interstitial fibrosis and chronic lung disease. No definite acute superimposed airspace disease, pleural effusion, or pneumothorax. Stable cardiomediastinal silhouette with aortic atherosclerosis. IMPRESSION: Chronic interstitial lung disease without definite acute interval change since 08/26/2020. Electronically Signed   By: Donavan Foil M.D.   On: 03/18/2021 21:58   DG Pelvis 1-2 Views  Result Date: 03/18/2021 CLINICAL DATA:  Fall with right femur deformity EXAM: PELVIS - 1-2 VIEW COMPARISON:  None. FINDINGS: Pubic  symphysis and rami appear intact. Both femoral heads project in joint. SI joints are non widened. Acute comminuted right intertrochanteric fracture with displaced lesser trochanteric fracture fragments. IMPRESSION: Acute comminuted and displaced right intertrochanteric fracture Electronically Signed   By: Donavan Foil M.D.   On: 03/18/2021 22:00   DG Tibia/Fibula Right  Result Date: 03/18/2021 CLINICAL DATA:  Fall with right femur deformity EXAM: RIGHT TIBIA AND FIBULA - 2 VIEW COMPARISON:  None. FINDINGS: There is no evidence of fracture or other focal bone lesions. Soft tissues are unremarkable. IMPRESSION: Negative. Electronically Signed   By: Donavan Foil M.D.   On: 03/18/2021 21:59   CT Head Wo Contrast  Result Date: 03/18/2021 CLINICAL DATA:  Fall EXAM: CT HEAD WITHOUT CONTRAST CT CERVICAL SPINE WITHOUT CONTRAST TECHNIQUE: Multidetector CT imaging of the head and cervical spine was performed following the standard protocol without intravenous contrast. Multiplanar CT image reconstructions of the cervical spine were also generated. COMPARISON:  CT head 08/26/2020, cervical spine radiograph 05/13/2020, CT cervical spine 03/03/2007 FINDINGS: CT HEAD FINDINGS Brain: Mild streak artifact across the posterior fossa from dental amalgam. No evidence of acute infarction,  hemorrhage, hydrocephalus, extra-axial collection, visible mass lesion or mass effect. Symmetric prominence of the ventricles, cisterns and sulci compatible with parenchymal volume loss. Patchy areas of white matter hypoattenuation are most compatible with chronic microvascular angiopathy. Vascular: Atherosclerotic calcification of the carotid siphons and intradural vertebral arteries. No hyperdense vessel. Skull: No significant scalp swelling or hematoma. No calvarial fracture or focal osseous abnormality. Sinuses/Orbits: Paranasal sinuses and mastoid air cells are predominantly clear. Middle ear cavities are clear. Debris in the right external auditory canal. Orbital structures are unremarkable aside from prior lens extractions. Other: Bilateral TMJ arthrosis. CT CERVICAL SPINE FINDINGS Alignment: Cervical stabilization collar in place at the time exam. Preservation of the normal cervical lordosis. Levocurvature of the cervical spine with dextrocurvature of the imaged thoracic levels. Some of which may be positional. No evidence of traumatic listhesis. No abnormally widened, perched or jumped facets. Normal alignment of the craniocervical and atlantoaxial articulations accounting for mild rightward cranial rotation. Skull base and vertebrae: No acute skull base fracture. No vertebral body fracture or height loss. Normal bone mineralization. No worrisome osseous lesions. Arthrosis about the atlantodental and basion dens intervals. Additional multilevel cervical spondylitic changes, further detailed below. Soft tissues and spinal canal: No pre or paravertebral fluid or swelling. No visible canal hematoma. Airways patent. No concerning adenopathy. Calcifications proximal great vessels and aortic arch as well as the cervical carotids. Partial visualization a left chest wall pacer pack and subclavian leads. Disc levels: Multilevel intervertebral disc height loss with spondylitic endplate changes. Some multilevel disc  osteophyte complexes partially efface the ventral thecal sac with more pronounced findings at the C3-4, C4-5 level where this results in at least mild canal stenosis. Multilevel uncinate spurring facet hypertrophic changes result in mild multilevel neural foraminal narrowing with more moderate narrowing at C3-4 as well. Upper chest: Progressive biapical pleuroparenchymal scarring and reticular interstitial changes, incompletely assessed on this exam. Consolidative process or convincing edema. No acute traumatic findings in the upper chest. Other: Normal thyroid. IMPRESSION: 1. No acute intracranial abnormality. No significant scalp swelling or hematoma. No calvarial fracture. 2. Background of microvascular angiopathy and diffuse parenchymal loss. 3. No acute fracture or traumatic listhesis of the cervical spine. 4. Possible cervical spondylitic changes; most pronounced about the atlantodental and basion dens interval as well as at the C3-4 level there is mild canal stenosis and moderate  bilateral foraminal narrowing. 5. Progressive scarring and interstitial reticular changes in lung apices. Incompletely assessed on this exam. Could consider outpatient HRCT for further interrogation as warranted. 6. Intracranial, cervical carotid and aortic atherosclerosis. Electronically Signed   By: Lovena Le M.D.   On: 03/18/2021 21:47   CT Cervical Spine Wo Contrast  Result Date: 03/18/2021 CLINICAL DATA:  Fall EXAM: CT HEAD WITHOUT CONTRAST CT CERVICAL SPINE WITHOUT CONTRAST TECHNIQUE: Multidetector CT imaging of the head and cervical spine was performed following the standard protocol without intravenous contrast. Multiplanar CT image reconstructions of the cervical spine were also generated. COMPARISON:  CT head 08/26/2020, cervical spine radiograph 05/13/2020, CT cervical spine 03/03/2007 FINDINGS: CT HEAD FINDINGS Brain: Mild streak artifact across the posterior fossa from dental amalgam. No evidence of acute  infarction, hemorrhage, hydrocephalus, extra-axial collection, visible mass lesion or mass effect. Symmetric prominence of the ventricles, cisterns and sulci compatible with parenchymal volume loss. Patchy areas of white matter hypoattenuation are most compatible with chronic microvascular angiopathy. Vascular: Atherosclerotic calcification of the carotid siphons and intradural vertebral arteries. No hyperdense vessel. Skull: No significant scalp swelling or hematoma. No calvarial fracture or focal osseous abnormality. Sinuses/Orbits: Paranasal sinuses and mastoid air cells are predominantly clear. Middle ear cavities are clear. Debris in the right external auditory canal. Orbital structures are unremarkable aside from prior lens extractions. Other: Bilateral TMJ arthrosis. CT CERVICAL SPINE FINDINGS Alignment: Cervical stabilization collar in place at the time exam. Preservation of the normal cervical lordosis. Levocurvature of the cervical spine with dextrocurvature of the imaged thoracic levels. Some of which may be positional. No evidence of traumatic listhesis. No abnormally widened, perched or jumped facets. Normal alignment of the craniocervical and atlantoaxial articulations accounting for mild rightward cranial rotation. Skull base and vertebrae: No acute skull base fracture. No vertebral body fracture or height loss. Normal bone mineralization. No worrisome osseous lesions. Arthrosis about the atlantodental and basion dens intervals. Additional multilevel cervical spondylitic changes, further detailed below. Soft tissues and spinal canal: No pre or paravertebral fluid or swelling. No visible canal hematoma. Airways patent. No concerning adenopathy. Calcifications proximal great vessels and aortic arch as well as the cervical carotids. Partial visualization a left chest wall pacer pack and subclavian leads. Disc levels: Multilevel intervertebral disc height loss with spondylitic endplate changes. Some  multilevel disc osteophyte complexes partially efface the ventral thecal sac with more pronounced findings at the C3-4, C4-5 level where this results in at least mild canal stenosis. Multilevel uncinate spurring facet hypertrophic changes result in mild multilevel neural foraminal narrowing with more moderate narrowing at C3-4 as well. Upper chest: Progressive biapical pleuroparenchymal scarring and reticular interstitial changes, incompletely assessed on this exam. Consolidative process or convincing edema. No acute traumatic findings in the upper chest. Other: Normal thyroid. IMPRESSION: 1. No acute intracranial abnormality. No significant scalp swelling or hematoma. No calvarial fracture. 2. Background of microvascular angiopathy and diffuse parenchymal loss. 3. No acute fracture or traumatic listhesis of the cervical spine. 4. Possible cervical spondylitic changes; most pronounced about the atlantodental and basion dens interval as well as at the C3-4 level there is mild canal stenosis and moderate bilateral foraminal narrowing. 5. Progressive scarring and interstitial reticular changes in lung apices. Incompletely assessed on this exam. Could consider outpatient HRCT for further interrogation as warranted. 6. Intracranial, cervical carotid and aortic atherosclerosis. Electronically Signed   By: Lovena Le M.D.   On: 03/18/2021 21:47   DG FEMUR, MIN 2 VIEWS RIGHT  Result Date: 03/18/2021  CLINICAL DATA:  Fall with femur deformity EXAM: RIGHT FEMUR 2 VIEWS COMPARISON:  None. FINDINGS: Acute comminuted and displaced right intertrochanteric femur fracture without femoral head dislocation. Vascular calcifications. Mid to distal femur appears intact IMPRESSION: Acute comminuted and displaced right intertrochanteric fracture Electronically Signed   By: Donavan Foil M.D.   On: 03/18/2021 22:01    Positive ROS: All other systems have been reviewed and were otherwise negative with the exception of those mentioned  in the HPI and as above.  Physical Exam: General: Alert, no acute distress Cardiovascular: No pedal edema Respiratory: No cyanosis, no use of accessory musculature GI: No organomegaly, abdomen is soft and non-tender Skin: No lesions in the area of chief complaint Neurologic: Sensation intact distally Psychiatric: Patient is competent for consent with normal mood and affect Lymphatic: No axillary or cervical lymphadenopathy  MUSCULOSKELETAL:   Right Upper Extremity: Bandaged skin tear on the right forearm. Full ROM of the right elbow, wrist, and digits. NV intact.   Left Lower Extremity:  Able to move hip, knee, ankle and foot. No Tenderness to palpation. Bandaged skin tear on the left lower leg.   Right Lower Extremity:  Resting in externally rotated position.   TTP of the right hip, mild-moderate swelling of the right hip. No tenderness to the knee. Knee ROM limited secondary to hip pain, positioning. Able to move foot and ankle.   No calf swelling or tenderness   SENSORY: sensation is intact to light touch in:  superficial peroneal nerve distribution (over dorsum of foot) deep peroneal nerve distribution (over first dorsal web space) sural nerve distribution (posterior to lateral malleolus) saphenous nerve distribution (over medial malleolus) medial plantar nerve distribution (medial sole of foot anteriorly) lateral plantar nerve distribution (lateral sole of foot anteriorly)  MOTOR:  +motor EHL (great toe dorsiflexion) + FHL (great toe plantar flexion)  + TA (ankle dorsiflexion)  + GSC (ankle plantar flexion)  VASCULAR: 2+ dorsalis pedis pulse, capillary refill < 2   Assessment: Acute comminuted and displaced right intertrochanteric femur fracture  Skin tears of the left leg, skin tears of the right arm.  Plan:  -Admit to medicine  -Right hip IM nail this morning on 03/19/2021 by Dr. Stann Mainland. Dr. Stann Mainland will call patient's daughter for consent secondary to  dementia.   Discussed with the patient that his fracture would require IM nail for repair.Patient in agreement with this plan.    Faythe Casa, PA    03/19/2021 8:20 AM

## 2021-03-20 LAB — URINE CULTURE: Culture: NO GROWTH

## 2021-03-20 LAB — BASIC METABOLIC PANEL
Anion gap: 8 (ref 5–15)
BUN: 38 mg/dL — ABNORMAL HIGH (ref 8–23)
CO2: 22 mmol/L (ref 22–32)
Calcium: 8.5 mg/dL — ABNORMAL LOW (ref 8.9–10.3)
Chloride: 100 mmol/L (ref 98–111)
Creatinine, Ser: 1.55 mg/dL — ABNORMAL HIGH (ref 0.61–1.24)
GFR, Estimated: 43 mL/min — ABNORMAL LOW (ref >60)
Glucose, Bld: 129 mg/dL — ABNORMAL HIGH (ref 70–99)
Potassium: 5.3 mmol/L — ABNORMAL HIGH (ref 3.5–5.1)
Sodium: 130 mmol/L — ABNORMAL LOW (ref 135–145)

## 2021-03-20 LAB — CBC
HCT: 27.4 % — ABNORMAL LOW (ref 39.0–52.0)
Hemoglobin: 9.3 g/dL — ABNORMAL LOW (ref 13.0–17.0)
MCH: 33.1 pg (ref 26.0–34.0)
MCHC: 33.9 g/dL (ref 30.0–36.0)
MCV: 97.5 fL (ref 80.0–100.0)
Platelets: 135 10*3/uL — ABNORMAL LOW (ref 150–400)
RBC: 2.81 MIL/uL — ABNORMAL LOW (ref 4.22–5.81)
RDW: 15.4 % (ref 11.5–15.5)
WBC: 16.9 10*3/uL — ABNORMAL HIGH (ref 4.0–10.5)
nRBC: 0 % (ref 0.0–0.2)

## 2021-03-20 LAB — GLUCOSE, CAPILLARY
Glucose-Capillary: 117 mg/dL — ABNORMAL HIGH (ref 70–99)
Glucose-Capillary: 124 mg/dL — ABNORMAL HIGH (ref 70–99)
Glucose-Capillary: 131 mg/dL — ABNORMAL HIGH (ref 70–99)
Glucose-Capillary: 134 mg/dL — ABNORMAL HIGH (ref 70–99)
Glucose-Capillary: 148 mg/dL — ABNORMAL HIGH (ref 70–99)
Glucose-Capillary: 163 mg/dL — ABNORMAL HIGH (ref 70–99)
Glucose-Capillary: 171 mg/dL — ABNORMAL HIGH (ref 70–99)
Glucose-Capillary: 181 mg/dL — ABNORMAL HIGH (ref 70–99)

## 2021-03-20 MED ORDER — SODIUM ZIRCONIUM CYCLOSILICATE 10 G PO PACK
10.0000 g | PACK | Freq: Once | ORAL | Status: AC
  Administered 2021-03-20: 10 g via ORAL
  Filled 2021-03-20: qty 1

## 2021-03-20 MED ORDER — ACETAMINOPHEN 325 MG PO TABS
650.0000 mg | ORAL_TABLET | Freq: Four times a day (QID) | ORAL | Status: DC | PRN
  Administered 2021-03-20 – 2021-03-22 (×6): 650 mg via ORAL
  Filled 2021-03-20 (×6): qty 2

## 2021-03-20 MED ORDER — SODIUM CHLORIDE 0.9 % IV SOLN: INTRAVENOUS | Status: DC

## 2021-03-20 MED ORDER — OXYCODONE HCL 5 MG PO TABS
5.0000 mg | ORAL_TABLET | Freq: Four times a day (QID) | ORAL | Status: DC | PRN
  Administered 2021-03-21 – 2021-03-22 (×5): 5 mg via ORAL
  Filled 2021-03-20 (×5): qty 1

## 2021-03-20 NOTE — Progress Notes (Signed)
Roberto MIKESELL  MRN: [id number] DOB/Age: 21-May-1931 87 y.o. Physician: Ander Slade, M.D. 85 Day Post-Op Procedure(s) (LRB): INTRAMEDULLARY (IM) NAIL ANTERIOR TROCHANTRIC (Right)  Subjective: Resting comfortably in bed.  Denies hip pain. Vital Signs Temp:  [97.6 F (36.4 C)-98.5 F (36.9 C)] 98 F (36.7 C) (07/03 0757) Pulse Rate:  [72-105] 92 (07/03 0757) Resp:  [16-24] 19 (07/03 0757) BP: (104-139)/(49-94) 135/80 (07/03 0757) SpO2:  [91 %-98 %] 94 % (07/03 0757)  Lab Results Recent Labs    03/19/21 0635 03/20/21 0137  WBC 20.9* 16.9*  HGB 12.9* 9.3*  HCT 39.5 27.4*  PLT 187 135*   BMET Recent Labs    03/19/21 0635 03/20/21 0137  NA 132* 130*  K 5.4* 5.3*  CL 102 100  CO2 22 22  GLUCOSE 171* 129*  BUN 47* 38*  CREATININE 1.97* 1.55*  CALCIUM 8.7* 8.5*   INR  Date Value Ref Range Status  05/12/2020 1.2 0.8 - 1.2 Final    Comment:    (NOTE) INR goal varies based on device and disease states. Performed at [facility], [place] [address] [place], [place] [address]      Exam  Right hip incision shows the dressing to be intact, clean and dry.  Thigh compartments soft.  Grossly neurovascular intact distally in the right lower extremity.  Plan Continue postop care as per Dr. Stann Mainland: Patient may be weightbearing as tolerated.  Continue DVT prophylaxis as previously outlined.  Ongoing medical management per the hospitalist service McDade Tadashi Burkel 03/20/2021, 9:38 AM   Contact # [phone]

## 2021-03-20 NOTE — Consult Note (Signed)
WOC Nurse Consult Note: Reason for Consult:Left LE skin tear Wound type:trauma Pressure Injury POA: N/A Measurement:6cm  x3cm x 0.1cm Wound bed:red, moist Drainage (amount, consistency, odor) serous to serosanguinous Periwound:intact, friable Dressing procedure/placement/frequency: While other skin tears exist, this is the most severe. Others can be managed by Nursing and the skin care order set; this wound will require twice daily attention. I have provided Nursing with guidance for care using a NS cleanse and application of a folded piece of antimicrobial nonadherent (xeroform, Lawson # 294) topped with an ABD pad for comfort and secured with a few turns of Kerlix roll gauze/paper tape.This is to be changed twice daily so that it does not dry out. Heels are to be floated for PI prevention.  Turning and repositioning is in place.  [facility] nursing team will not follow, but will remain available to this patient, the nursing and medical teams.  Please re-consult if needed. Thanks, Maudie Flakes, MSN, RN, [place], Arther Abbott  Pager# [phone]

## 2021-03-20 NOTE — Progress Notes (Signed)
PROGRESS NOTE    [name]  PZW:[id number] DOB: 1931-04-07 DOA: 03/18/2021 PCP: Shirline Frees, MD   Chief Complain: Fall, right hip pain  Brief Narrative: Patient is a 87 year old male with history of dementia, hypertension, sick sinus syndrome with pacer, myasthenia gravis, coronary artery disease who presented from [facility] memory care to the emergency department with complaint of fall followed by pain in the right hip.  He was ambulating in his room with a walker when he fell and developed severe right hip pain, unable to walk.   Lab work showed AKI with creatinine of 1.6, leukocytosis.  CT head was negative for acute abnormalities, CT cervical spine did not show any fractures.  X-ray of the hip showed acute comminuted and displaced right intertrochanteric femur fracture.  Orthopedics consulted and he underwent ORIF/intramedullary nailing on 03/19/2021  Assessment & Plan:   Principal Problem:   Closed displaced intertrochanteric fracture of right femur ([place]) Active Problems:   Essential hypertension   Coronary artery disease involving native coronary artery of native heart without angina pectoris   SSS (sick sinus syndrome) (HCC)   Myasthenia gravis ([place])   Late onset Alzheimer's disease with behavioral disturbance ([place])   Renal insufficiency   Hyponatremia   Hyperglycemia   Right hip fracture: Fell while ambulating with walker in his room at memory care.  X-ray showedacute comminuted and displaced right intertrochanteric femur fracture.  Orthopedics consulted and  he underwent ORIF/intramedullary nailing on 03/19/2021. Continue pain management, supportive care. On aspirin for dvt ppx.  PT/OT consultation pending.  Might need a skilled nursing facility on discharge.  Acute urinary retention/AKI: Creatinine was 1.09 in December 21.  Found to be acutely retaining urine in the emergency department. Continue to monitor kidney function, avoid nephrotoxins.   Foley has been placed in  the emergency department.  On proscar.  Kidney function improved with IV fluids.We will give a voiding trail.  Continue gentle IV fluids Potassium of 5.3 today, given Lokelma.  Respiratory insufficiency: Resolved.  He was requiring 2 L of oxygen per minute.  He does not use oxygen at baseline.  Chest x-ray showed Chronic interstitial lung disease without definite acute interval change since 08/26/2020.  Currently on room air  History of hypertension: Blood pressure soft on admission.  Now stable.  He takes Aldactone at home, currently on hold.  Other hypertensives on hold  History of myasthenia gravis: On chronic steroid use.  On prednisone, Mestinon at home.  Hydrocortisone ordered perioperatively  History of dementia: Currently alert and oriented. Continue delirium precaution.  Patient resides in memory care  Coronary artery disease: No anginal symptoms.  Takes aspirin at home.  Also on beta-blocker which is on hold due to low blood pressure on admisison  Hyperglycemia: Continue sliding scale insulin.  Recent hemoglobin A1c of 6.1% as per August 2021.  Laceration of left leg: Wound care consulted  Leukocytosis: Likely from steroid therapy.  Continue to monitor  Normocytic anemia/thrombocytopenia: Hemoglobin dropped from 14-9.3 today most likely secondary to hemodilution from IV fluids and surgery.  Continue to monitor.  Continue to monitor platelets.    Nutrition Problem: Increased nutrient needs Etiology: post-op healing      DVT prophylaxis: SCD Code Status: Full code Family Communication: None at the bedside Status is: Inpatient  Remains inpatient appropriate because:Unsafe d/c plan  Dispo: The patient is from:  Memory care              Anticipated d/c is to: SNF  Patient currently is not medically stable to d/c.   Difficult to place patient No     Consultants: Orthopedics  Procedures:None yet  Antimicrobials:  Anti-infectives (From admission, onward)     Start     Dose/Rate Route Frequency Ordered Stop   03/19/21 1230  ceFAZolin (ANCEF) IVPB 2g/100 mL premix        2 g 200 mL/hr over 30 Minutes Intravenous Every 6 hours 03/19/21 1140 03/19/21 1906   03/19/21 0800  ceFAZolin (ANCEF) IVPB 2g/100 mL premix        2 g 200 mL/hr over 30 Minutes Intravenous To [facility] 03/19/21 0157 03/19/21 0922   03/19/21 0600  ceFAZolin (ANCEF) IVPB 2g/100 mL premix  Status:  Discontinued        2 g 200 mL/hr over 30 Minutes Intravenous On call to O.R. 03/19/21 0150 03/19/21 0157       Subjective:  Patient seen and examined at the bedside this morning.  On room air.  Denies any complaints.  Pain well controlled.   Objective: Vitals:   03/19/21 1624 03/19/21 2035 03/20/21 0100 03/20/21 0434  BP: (!) 107/94 139/70 125/66 126/63  Pulse: (!) 101 (!) 105 79 83  Resp: 19 16 16 17   Temp: 97.8 F (36.6 C) 97.6 F (36.4 C) 97.8 F (36.6 C) 98 F (36.7 C)  TempSrc: Oral Oral Axillary Axillary  SpO2: 95% 93% 91% 92%  Weight:      Height:        Intake/Output Summary (Last 24 hours) at 03/20/2021 0753 Last data filed at 03/20/2021 0530 Gross per 24 hour  Intake 1800 ml  Output 1850 ml  Net -50 ml   Filed Weights   03/19/21 0847  Weight: 73.5 kg    Examination:  General exam: Overall comfortable, not in distress HEENT: PERRL Respiratory system:  no wheezes or crackles  Cardiovascular system: S1 & S2 heard, RRR.  Gastrointestinal system: Abdomen is nondistended, soft and nontender. Central nervous system: Alert and oriented Extremities: No edema, no clubbing ,no cyanosis, clean surgical wound on the right hip Skin: No rashes, no ulcers,no icterus      Data Reviewed: I have personally reviewed following labs and imaging studies  CBC: Recent Labs  Lab 03/18/21 2159 03/19/21 0635 03/20/21 0137  WBC 21.1* 20.9* 16.9*  NEUTROABS 18.0*  --   --   HGB 14.1 12.9* 9.3*  HCT 42.2 39.5 27.4*  MCV 97.7 99.7 97.5  PLT 197 187  160*   Basic Metabolic Panel: Recent Labs  Lab 03/18/21 2159 03/19/21 0635 03/20/21 0137  NA 130* 132* 130*  K 5.1 5.4* 5.3*  CL 100 102 100  CO2 21* 22 22  GLUCOSE 170* 171* 129*  BUN 42* 47* 38*  CREATININE 1.61* 1.97* 1.55*  CALCIUM 9.1 8.7* 8.5*   GFR: Estimated Creatinine Clearance: 30.8 mL/min (A) (by C-G formula based on SCr of 1.55 mg/dL (H)). Liver Function Tests: No results for input(s): AST, ALT, ALKPHOS, BILITOT, PROT, ALBUMIN in the last 168 hours. No results for input(s): LIPASE, AMYLASE in the last 168 hours. No results for input(s): AMMONIA in the last 168 hours. Coagulation Profile: No results for input(s): INR, PROTIME in the last 168 hours. Cardiac Enzymes: No results for input(s): CKTOTAL, CKMB, CKMBINDEX, TROPONINI in the last 168 hours. BNP (last 3 results) No results for input(s): PROBNP in the last 8760 hours. HbA1C: Recent Labs    03/19/21 0334  HGBA1C 5.9*   CBG: Recent Labs  Lab  03/19/21 0814 03/19/21 1626 03/19/21 2037 03/19/21 2359 03/20/21 0411  GLUCAP 161* 179* 174* 163* 134*   Lipid Profile: No results for input(s): CHOL, HDL, LDLCALC, TRIG, CHOLHDL, LDLDIRECT in the last 72 hours. Thyroid Function Tests: No results for input(s): TSH, T4TOTAL, FREET4, T3FREE, THYROIDAB in the last 72 hours. Anemia Panel: No results for input(s): VITAMINB12, FOLATE, FERRITIN, TIBC, IRON, RETICCTPCT in the last 72 hours. Sepsis Labs: No results for input(s): PROCALCITON, LATICACIDVEN in the last 168 hours.  Recent Results (from the past 240 hour(s))  Resp Panel by RT-PCR (Flu A&B, Covid) Nasopharyngeal Swab     Status: None   Collection Time: 03/18/21 10:01 PM   Specimen: Nasopharyngeal Swab; Nasopharyngeal(NP) swabs in vial transport medium  Result Value Ref Range Status   SARS Coronavirus 2 by RT PCR NEGATIVE NEGATIVE Final    Comment: (NOTE) SARS-CoV-2 target nucleic acids are NOT DETECTED.  The SARS-CoV-2 RNA is generally detectable in  upper respiratory specimens during the acute phase of infection. The lowest concentration of SARS-CoV-2 viral copies this assay can detect is 138 copies/mL. A negative result does not preclude SARS-Cov-2 infection and should not be used as the sole basis for treatment or other patient management decisions. A negative result may occur with  improper specimen collection/handling, submission of specimen other than nasopharyngeal swab, presence of viral mutation(s) within the areas targeted by this assay, and inadequate number of viral copies(<138 copies/mL). A negative result must be combined with clinical observations, patient history, and epidemiological information. The expected result is Negative.  Fact Sheet for Patients:  EntrepreneurPulse.com.au  Fact Sheet for Healthcare Providers:  IncredibleEmployment.be  This test is no t yet approved or cleared by the [place] FDA and  has been authorized for detection and/or diagnosis of SARS-CoV-2 by FDA under an Emergency Use Authorization (EUA). This EUA will remain  in effect (meaning this test can be used) for the duration of the COVID-19 declaration under Section 564(b)(1) of the Act, 21 U.S.C.section 360bbb-3(b)(1), unless the authorization is terminated  or revoked sooner.       Influenza A by PCR NEGATIVE NEGATIVE Final   Influenza B by PCR NEGATIVE NEGATIVE Final    Comment: (NOTE) The Xpert Xpress SARS-CoV-2/FLU/RSV plus assay is intended as an aid in the diagnosis of influenza from Nasopharyngeal swab specimens and should not be used as a sole basis for treatment. Nasal washings and aspirates are unacceptable for Xpert Xpress SARS-CoV-2/FLU/RSV testing.  Fact Sheet for Patients: EntrepreneurPulse.com.au  Fact Sheet for Healthcare Providers: IncredibleEmployment.be  This test is not yet approved or cleared by the [place] FDA and has been  authorized for detection and/or diagnosis of SARS-CoV-2 by FDA under an Emergency Use Authorization (EUA). This EUA will remain in effect (meaning this test can be used) for the duration of the COVID-19 declaration under Section 564(b)(1) of the Act, 21 U.S.C. section 360bbb-3(b)(1), unless the authorization is terminated or revoked.  Performed at [facility], [place] [address], [place], [place] [address]          Radiology Studies: DG Chest 1 View  Result Date: 03/18/2021 CLINICAL DATA:  Fall with deformity EXAM: CHEST  1 VIEW COMPARISON:  08/26/2020, 05/12/2020, 04/09/2020 FINDINGS: Apical pleural thickening. Left-sided pacing device as before. Bilateral peripheral and lower lung reticular opacity consistent with interstitial fibrosis and chronic lung disease. No definite acute superimposed airspace disease, pleural effusion, or pneumothorax. Stable cardiomediastinal silhouette with aortic atherosclerosis. IMPRESSION: Chronic interstitial lung disease without definite acute interval  change since 08/26/2020. Electronically Signed   By: Donavan Foil M.D.   On: 03/18/2021 21:58   DG Pelvis 1-2 Views  Result Date: 03/18/2021 CLINICAL DATA:  Fall with right femur deformity EXAM: PELVIS - 1-2 VIEW COMPARISON:  None. FINDINGS: Pubic symphysis and rami appear intact. Both femoral heads project in joint. SI joints are non widened. Acute comminuted right intertrochanteric fracture with displaced lesser trochanteric fracture fragments. IMPRESSION: Acute comminuted and displaced right intertrochanteric fracture Electronically Signed   By: Donavan Foil M.D.   On: 03/18/2021 22:00   DG Tibia/Fibula Right  Result Date: 03/18/2021 CLINICAL DATA:  Fall with right femur deformity EXAM: RIGHT TIBIA AND FIBULA - 2 VIEW COMPARISON:  None. FINDINGS: There is no evidence of fracture or other focal bone lesions. Soft tissues are unremarkable. IMPRESSION: Negative. Electronically Signed   By: Donavan Foil  M.D.   On: 03/18/2021 21:59   CT Head Wo Contrast  Result Date: 03/18/2021 CLINICAL DATA:  Fall EXAM: CT HEAD WITHOUT CONTRAST CT CERVICAL SPINE WITHOUT CONTRAST TECHNIQUE: Multidetector CT imaging of the head and cervical spine was performed following the standard protocol without intravenous contrast. Multiplanar CT image reconstructions of the cervical spine were also generated. COMPARISON:  CT head 08/26/2020, cervical spine radiograph 05/13/2020, CT cervical spine 03/03/2007 FINDINGS: CT HEAD FINDINGS Brain: Mild streak artifact across the posterior fossa from dental amalgam. No evidence of acute infarction, hemorrhage, hydrocephalus, extra-axial collection, visible mass lesion or mass effect. Symmetric prominence of the ventricles, cisterns and sulci compatible with parenchymal volume loss. Patchy areas of white matter hypoattenuation are most compatible with chronic microvascular angiopathy. Vascular: Atherosclerotic calcification of the carotid siphons and intradural vertebral arteries. No hyperdense vessel. Skull: No significant scalp swelling or hematoma. No calvarial fracture or focal osseous abnormality. Sinuses/Orbits: Paranasal sinuses and mastoid air cells are predominantly clear. Middle ear cavities are clear. Debris in the right external auditory canal. Orbital structures are unremarkable aside from prior lens extractions. Other: Bilateral TMJ arthrosis. CT CERVICAL SPINE FINDINGS Alignment: Cervical stabilization collar in place at the time exam. Preservation of the normal cervical lordosis. Levocurvature of the cervical spine with dextrocurvature of the imaged thoracic levels. Some of which may be positional. No evidence of traumatic listhesis. No abnormally widened, perched or jumped facets. Normal alignment of the craniocervical and atlantoaxial articulations accounting for mild rightward cranial rotation. Skull base and vertebrae: No acute skull base fracture. No vertebral body fracture or  height loss. Normal bone mineralization. No worrisome osseous lesions. Arthrosis about the atlantodental and basion dens intervals. Additional multilevel cervical spondylitic changes, further detailed below. Soft tissues and spinal canal: No pre or paravertebral fluid or swelling. No visible canal hematoma. Airways patent. No concerning adenopathy. Calcifications proximal great vessels and aortic arch as well as the cervical carotids. Partial visualization a left chest wall pacer pack and subclavian leads. Disc levels: Multilevel intervertebral disc height loss with spondylitic endplate changes. Some multilevel disc osteophyte complexes partially efface the ventral thecal sac with more pronounced findings at the C3-4, C4-5 level where this results in at least mild canal stenosis. Multilevel uncinate spurring facet hypertrophic changes result in mild multilevel neural foraminal narrowing with more moderate narrowing at C3-4 as well. Upper chest: Progressive biapical pleuroparenchymal scarring and reticular interstitial changes, incompletely assessed on this exam. Consolidative process or convincing edema. No acute traumatic findings in the upper chest. Other: Normal thyroid. IMPRESSION: 1. No acute intracranial abnormality. No significant scalp swelling or hematoma. No calvarial fracture. 2. Background of  microvascular angiopathy and diffuse parenchymal loss. 3. No acute fracture or traumatic listhesis of the cervical spine. 4. Possible cervical spondylitic changes; most pronounced about the atlantodental and basion dens interval as well as at the C3-4 level there is mild canal stenosis and moderate bilateral foraminal narrowing. 5. Progressive scarring and interstitial reticular changes in lung apices. Incompletely assessed on this exam. Could consider outpatient HRCT for further interrogation as warranted. 6. Intracranial, cervical carotid and aortic atherosclerosis. Electronically Signed   By: Lovena Le M.D.    On: 03/18/2021 21:47   CT Cervical Spine Wo Contrast  Result Date: 03/18/2021 CLINICAL DATA:  Fall EXAM: CT HEAD WITHOUT CONTRAST CT CERVICAL SPINE WITHOUT CONTRAST TECHNIQUE: Multidetector CT imaging of the head and cervical spine was performed following the standard protocol without intravenous contrast. Multiplanar CT image reconstructions of the cervical spine were also generated. COMPARISON:  CT head 08/26/2020, cervical spine radiograph 05/13/2020, CT cervical spine 03/03/2007 FINDINGS: CT HEAD FINDINGS Brain: Mild streak artifact across the posterior fossa from dental amalgam. No evidence of acute infarction, hemorrhage, hydrocephalus, extra-axial collection, visible mass lesion or mass effect. Symmetric prominence of the ventricles, cisterns and sulci compatible with parenchymal volume loss. Patchy areas of white matter hypoattenuation are most compatible with chronic microvascular angiopathy. Vascular: Atherosclerotic calcification of the carotid siphons and intradural vertebral arteries. No hyperdense vessel. Skull: No significant scalp swelling or hematoma. No calvarial fracture or focal osseous abnormality. Sinuses/Orbits: Paranasal sinuses and mastoid air cells are predominantly clear. Middle ear cavities are clear. Debris in the right external auditory canal. Orbital structures are unremarkable aside from prior lens extractions. Other: Bilateral TMJ arthrosis. CT CERVICAL SPINE FINDINGS Alignment: Cervical stabilization collar in place at the time exam. Preservation of the normal cervical lordosis. Levocurvature of the cervical spine with dextrocurvature of the imaged thoracic levels. Some of which may be positional. No evidence of traumatic listhesis. No abnormally widened, perched or jumped facets. Normal alignment of the craniocervical and atlantoaxial articulations accounting for mild rightward cranial rotation. Skull base and vertebrae: No acute skull base fracture. No vertebral body fracture  or height loss. Normal bone mineralization. No worrisome osseous lesions. Arthrosis about the atlantodental and basion dens intervals. Additional multilevel cervical spondylitic changes, further detailed below. Soft tissues and spinal canal: No pre or paravertebral fluid or swelling. No visible canal hematoma. Airways patent. No concerning adenopathy. Calcifications proximal great vessels and aortic arch as well as the cervical carotids. Partial visualization a left chest wall pacer pack and subclavian leads. Disc levels: Multilevel intervertebral disc height loss with spondylitic endplate changes. Some multilevel disc osteophyte complexes partially efface the ventral thecal sac with more pronounced findings at the C3-4, C4-5 level where this results in at least mild canal stenosis. Multilevel uncinate spurring facet hypertrophic changes result in mild multilevel neural foraminal narrowing with more moderate narrowing at C3-4 as well. Upper chest: Progressive biapical pleuroparenchymal scarring and reticular interstitial changes, incompletely assessed on this exam. Consolidative process or convincing edema. No acute traumatic findings in the upper chest. Other: Normal thyroid. IMPRESSION: 1. No acute intracranial abnormality. No significant scalp swelling or hematoma. No calvarial fracture. 2. Background of microvascular angiopathy and diffuse parenchymal loss. 3. No acute fracture or traumatic listhesis of the cervical spine. 4. Possible cervical spondylitic changes; most pronounced about the atlantodental and basion dens interval as well as at the C3-4 level there is mild canal stenosis and moderate bilateral foraminal narrowing. 5. Progressive scarring and interstitial reticular changes in lung apices. Incompletely assessed  on this exam. Could consider outpatient HRCT for further interrogation as warranted. 6. Intracranial, cervical carotid and aortic atherosclerosis. Electronically Signed   By: Lovena Le M.D.    On: 03/18/2021 21:47   DG C-Arm 1-60 Min  Result Date: 03/19/2021 CLINICAL DATA:  RIGHT IM nail. EXAM: RIGHT FEMUR 2 VIEWS; DG C-ARM 1-60 MIN COMPARISON:  03/18/2021 FINDINGS: Five images demonstrate proximal and distal aspects of intramedullary nail and lag screw across intertrochanteric fracture of the RIGHT hip no interval fractures. Hip is located. IMPRESSION: IM nail of the RIGHT femur. Electronically Signed   By: Nolon Nations M.D.   On: 03/19/2021 12:13   DG FEMUR, MIN 2 VIEWS RIGHT  Result Date: 03/19/2021 CLINICAL DATA:  RIGHT IM nail. EXAM: RIGHT FEMUR 2 VIEWS; DG C-ARM 1-60 MIN COMPARISON:  03/18/2021 FINDINGS: Five images demonstrate proximal and distal aspects of intramedullary nail and lag screw across intertrochanteric fracture of the RIGHT hip no interval fractures. Hip is located. IMPRESSION: IM nail of the RIGHT femur. Electronically Signed   By: Nolon Nations M.D.   On: 03/19/2021 12:13   DG FEMUR, MIN 2 VIEWS RIGHT  Result Date: 03/18/2021 CLINICAL DATA:  Fall with femur deformity EXAM: RIGHT FEMUR 2 VIEWS COMPARISON:  None. FINDINGS: Acute comminuted and displaced right intertrochanteric femur fracture without femoral head dislocation. Vascular calcifications. Mid to distal femur appears intact IMPRESSION: Acute comminuted and displaced right intertrochanteric fracture Electronically Signed   By: Donavan Foil M.D.   On: 03/18/2021 22:01        Scheduled Meds:  aspirin EC  325 mg Oral Q breakfast   Chlorhexidine Gluconate Cloth  6 each Topical Daily   docusate sodium  100 mg Oral BID   feeding supplement (GLUCERNA SHAKE)  237 mL Oral TID BM   finasteride  5 mg Oral Daily   insulin aspart  0-6 Units Subcutaneous Q4H   multivitamin with minerals  1 tablet Oral Daily   pantoprazole  40 mg Oral Daily   predniSONE  20 mg Oral Q breakfast   pyridostigmine  60 mg Oral 4 times per day   sodium zirconium cyclosilicate  10 g Oral Once   Continuous Infusions:   methocarbamol (ROBAXIN) IV       LOS: 2 days    Time spent: 25 mins.More than 50% of that time was spent in counseling and/or coordination of care.      Shelly Coss, MD [facility] P7/11/2020, 7:53 AM

## 2021-03-20 NOTE — Evaluation (Signed)
Physical Therapy Evaluation Patient Details Name: [name] MRN: [id number] DOB: 1931-07-05 Today's Date: 03/20/2021   History of Present Illness  Patient is a 87 year old male who presented from [facility] memory care to the emergency department with complaint of fall followed by pain in the right hip.  He was ambulating in his room with a walker when he fell and developed severe right hip pain, unable to walk; s/p IM Nail on 7/2, WBAT; with history of dementia, hypertension, sick sinus syndrome with pacer, myasthenia gravis, coronary artery disease  Clinical Impression   Pt admitted with above diagnosis. Comes from his apartment at [facility] where he is on a memory care unit Pt currently with functional limitations due to the deficits listed below (see PT Problem List). Not entirely clear on baseline level of function, but he does use a RW for ambulation typically; Presents to PT with significant pain RLE whih effects functional mobility, and standing toelrance; Requires Max assist to get up to sitting EOB, Max assist of 2 to stand; Even with the amountof pain he was in, pt attemtped all the mobility tasks asked of him; Will need SNF for rehab at dc; Pt will benefit from skilled PT to increase their independence and safety with mobility to allow discharge to the venue listed below.       Follow Up Recommendations SNF    Equipment Recommendations  Rolling walker with 5" wheels;3in1 (PT);Wheelchair (measurements PT);Wheelchair cushion (measurements PT)    Recommendations for Other Services       Precautions / Restrictions Precautions Precautions: Fall Restrictions RLE Weight Bearing: Weight bearing as tolerated      Mobility  Bed Mobility Overal bed mobility: Needs Assistance Bed Mobility: Supine to Sit     Supine to sit: Max assist     General bed mobility comments: Step by step cueing with good effort; min assist to help LEs off of the bed; Max assist to elevate  trunk to sit, and to square off hips at EOB    Transfers Overall transfer level: Needs assistance Equipment used: Rolling walker (2 wheeled) Transfers: Sit to/from Stand Sit to Stand: Mod assist;+2 physical assistance         General transfer comment: Mod assist of 2 to power up; heavy antalgic lean to L in standing  Ambulation/Gait Ambulation/Gait assistance: Max assist;+2 safety/equipment;+2 physical assistance Gait Distance (Feet):  (2 small steps to the R towards [facility]) Assistive device: Rolling walker (2 wheeled)       General Gait Details: short steps, decr tolerance of weight bearing RLE  Stairs            Wheelchair Mobility    Modified Rankin (Stroke Patients Only)       Balance Overall balance assessment: Needs assistance   Sitting balance-Leahy Scale: Poor       Standing balance-Leahy Scale: Poor                               Pertinent Vitals/Pain Pain Assessment: Faces Faces Pain Scale: Hurts whole lot Pain Location: R hip with movement and weight bearing Pain Descriptors / Indicators: Grimacing;Guarding Pain Intervention(s): Premedicated before session;Repositioned    Home Living Family/patient expects to be discharged to:: Assisted living (From [place] per ED notes)   Available Help at Discharge: Other (Comment) (staff at [place])  Prior Function Level of Independence: Needs assistance   Gait / Transfers Assistance Needed: With a RW; perhaps with a Rollator -- pt states he keeps falling with the particular RW he was using  ADL's / Homemaking Assistance Needed: Staff assist  Comments: Not a fully reliable historian; peices of prior level of function taken form chart review     Hand Dominance        Extremity/Trunk Assessment   Upper Extremity Assessment Upper Extremity Assessment: Generalized weakness    Lower Extremity Assessment Lower Extremity Assessment: RLE  deficits/detail RLE Deficits / Details: Grossly decr AROM and strength limited by pain postop RLE Coordination: decreased gross motor       Communication   Communication: HOH  Cognition Arousal/Alertness: Awake/alert Behavior During Therapy: WFL for tasks assessed/performed Overall Cognitive Status: History of cognitive impairments - at baseline                                        General Comments General comments (skin integrity, edema, etc.): multiple skin tears, dressed    Exercises     Assessment/Plan    PT Assessment Patient needs continued PT services  PT Problem List Decreased strength;Decreased range of motion;Decreased activity tolerance;Decreased balance;Decreased mobility;Decreased coordination;Decreased cognition;Decreased knowledge of use of DME;Decreased safety awareness;Decreased knowledge of precautions;Pain       PT Treatment Interventions DME instruction;Gait training;Functional mobility training;Therapeutic activities;Therapeutic exercise;Balance training;Patient/family education;Cognitive remediation    PT Goals (Current goals can be found in the Care Plan section)  Acute Rehab PT Goals Patient Stated Goal: Agreeable to try standing PT Goal Formulation: Patient unable to participate in goal setting Time For Goal Achievement: 04/03/21 Potential to Achieve Goals: Fair    Frequency Min 2X/week   Barriers to discharge        Co-evaluation               AM-PAC PT "6 Clicks" Mobility  Outcome Measure Help needed turning from your back to your side while in a flat bed without using bedrails?: A Lot Help needed moving from lying on your back to sitting on the side of a flat bed without using bedrails?: A Lot Help needed moving to and from a bed to a chair (including a wheelchair)?: A Lot Help needed standing up from a chair using your arms (e.g., wheelchair or bedside chair)?: A Lot Help needed to walk in hospital room?:  Total Help needed climbing 3-5 steps with a railing? : Total 6 Click Score: 10    End of Session Equipment Utilized During Treatment: Gait belt Activity Tolerance: Patient limited by pain Patient left: in bed;with call bell/phone within reach;with bed alarm set Nurse Communication: Mobility status PT Visit Diagnosis: Unsteadiness on feet (R26.81);History of falling (Z91.81);Pain Pain - Right/Left: Right Pain - part of body: Hip    Time: 1550-1610 PT Time Calculation (min) (ACUTE ONLY): 20 min   Charges:   PT Evaluation $PT Eval Moderate Complexity: 1 Mod          Roney Marion, PT  Acute Rehabilitation Services Pager [phone] Office [phone]   Colletta [place] 03/20/2021, 7:29 PM

## 2021-03-21 LAB — CBC
HCT: 24.3 % — ABNORMAL LOW (ref 39.0–52.0)
Hemoglobin: 8.1 g/dL — ABNORMAL LOW (ref 13.0–17.0)
MCH: 32.7 pg (ref 26.0–34.0)
MCHC: 33.3 g/dL (ref 30.0–36.0)
MCV: 98 fL (ref 80.0–100.0)
Platelets: 122 10*3/uL — ABNORMAL LOW (ref 150–400)
RBC: 2.48 MIL/uL — ABNORMAL LOW (ref 4.22–5.81)
RDW: 15.9 % — ABNORMAL HIGH (ref 11.5–15.5)
WBC: 15.3 10*3/uL — ABNORMAL HIGH (ref 4.0–10.5)
nRBC: 0.1 % (ref 0.0–0.2)

## 2021-03-21 LAB — GLUCOSE, CAPILLARY
Glucose-Capillary: 113 mg/dL — ABNORMAL HIGH (ref 70–99)
Glucose-Capillary: 94 mg/dL (ref 70–99)
Glucose-Capillary: 135 mg/dL — ABNORMAL HIGH (ref 70–99)
Glucose-Capillary: 148 mg/dL — ABNORMAL HIGH (ref 70–99)

## 2021-03-21 LAB — BASIC METABOLIC PANEL
Anion gap: 5 (ref 5–15)
BUN: 29 mg/dL — ABNORMAL HIGH (ref 8–23)
CO2: 24 mmol/L (ref 22–32)
Calcium: 8.1 mg/dL — ABNORMAL LOW (ref 8.9–10.3)
Chloride: 100 mmol/L (ref 98–111)
Creatinine, Ser: 1.18 mg/dL (ref 0.61–1.24)
GFR, Estimated: 59 mL/min — ABNORMAL LOW (ref >60)
Glucose, Bld: 107 mg/dL — ABNORMAL HIGH (ref 70–99)
Potassium: 4.6 mmol/L (ref 3.5–5.1)
Sodium: 129 mmol/L — ABNORMAL LOW (ref 135–145)

## 2021-03-21 MED ORDER — HYDROXYZINE HCL 50 MG/ML IM SOLN
25.0000 mg | Freq: Once | INTRAMUSCULAR | Status: DC
  Filled 2021-03-21: qty 0.5

## 2021-03-21 MED ORDER — QUETIAPINE FUMARATE 25 MG PO TABS
25.0000 mg | ORAL_TABLET | Freq: Every day | ORAL | Status: DC
  Administered 2021-03-21: 25 mg via ORAL
  Filled 2021-03-21: qty 1

## 2021-03-21 NOTE — TOC Initial Note (Signed)
Transition of Care [facility]) - Initial/Assessment Note    Patient Details  Name: [name] MRN: [id number] Date of Birth: Sep 08, 1931  Transition of Care [facility]) CM/SW Contact:    Bethann Berkshire, [place] Phone Number: 03/21/2021, 10:01 AM  Clinical Narrative:                  Pt is not oriented. CSW called pt daughter Shirlean Mylar to discuss SNF rec. She is agreeable to SNF. She reports pt was ambulating at memory care facility independently with a walker prior to fall. She states he has been to [facility] in the past and didn't like it. She is agreeable to SNF workup at this time. Pt is covid vaccinated x3. She inquires about length of time that pt may need SNF as she is moving away from [place] in the next month and explains if pt would need longer term rehab family may want SNF outside of [place]. For now she is agreeable to SNF search in [place] areas. FL2 completed and bed requests sent in hub.   Expected Discharge Plan: Skilled Nursing Facility Barriers to Discharge: Continued Medical Work up   Patient Goals and CMS Choice     Choice offered to / list presented to : Adult Children  Expected Discharge Plan and Services Expected Discharge Plan: [place] Acute Care Choice: [place] arrangements for the past 2 months: [place] (Memory care)                                      Prior Living Arrangements/Services Living arrangements for the past 2 months: [place] (Memory care) Lives with:: Facility Resident Patient language and need for interpreter reviewed:: Yes        Need for Family Participation in Patient Care: Yes (Comment) Care giver support system in place?: Yes (comment)   Criminal Activity/Legal Involvement Pertinent to Current Situation/Hospitalization: No - Comment as needed  Activities of Daily Living Home Assistive Devices/Equipment: Environmental consultant (specify type) ADL Screening (condition  at time of admission) Patient's cognitive ability adequate to safely complete daily activities?: No Is the patient deaf or have difficulty hearing?: No Does the patient have difficulty seeing, even when wearing glasses/contacts?: No Does the patient have difficulty concentrating, remembering, or making decisions?: Yes Patient able to express need for assistance with ADLs?: No Does the patient have difficulty dressing or bathing?: Yes Independently performs ADLs?: No Communication: Independent Dressing (OT): Independent Grooming: Independent Feeding: Independent Bathing: Independent Toileting: Independent with device (comment), Independent In/Out Bed: Independent with device (comment) Walks in Home: Independent with device (comment) Does the patient have difficulty walking or climbing stairs?: Yes Weakness of Legs: None Weakness of Arms/Hands: None  Permission Sought/Granted                  Emotional Assessment       Orientation: : Oriented to Self, Oriented to Place Alcohol / Substance Use: Not Applicable Psych Involvement: No (comment)  Admission diagnosis:  Fall [W19.XXXA] Closed displaced intertrochanteric fracture of right femur, initial encounter ([place]) [S72.141A] Closed right hip fracture, initial encounter [facility]) [S72.001A] Patient Active Problem List   Diagnosis Date Noted   Hyperglycemia 03/19/2021   Closed displaced intertrochanteric fracture of right femur ([place]) 03/18/2021   Renal insufficiency 03/18/2021   Hyponatremia 03/18/2021   Late onset Alzheimer's disease with behavioral disturbance ([place]) 11/02/2020  Benign prostatic hyperplasia with urinary frequency 08/27/2020   Myasthenia gravis ([place]) 08/27/2020   AMS (altered mental status) 48/25/0037   Toxic metabolic encephalopathy 04/88/8916   Stroke-like symptom 05/12/2020   Leukocytosis 05/12/2020   Macrocytosis 05/12/2020   Stroke-like symptoms 05/12/2020   Closed fracture of right distal humerus  07/03/2019   Carotid artery disease ([place]) 09/09/2018   Pacemaker-St.Jude 07/03/2012   Bruit 09/28/2011   Tongue lesion 08/15/2011   Essential hypertension 06/23/2010   Mixed hyperlipidemia 09/22/2008   Coronary artery disease involving native coronary artery of native heart without angina pectoris 09/22/2008   Edema 09/22/2008   SSS (sick sinus syndrome) ([place]) 09/22/2008   PCP:  Shirline Frees, MD Pharmacy:  No Pharmacies Listed    Social Determinants of Health (SDOH) Interventions    Readmission Risk Interventions No flowsheet data found.

## 2021-03-21 NOTE — Progress Notes (Signed)
PROGRESS NOTE    [name]  VQQ:[id number] DOB: [birth year] DOA: 03/18/2021 PCP: Shirline Frees, MD   Chief Complain: Fall, right hip pain  Brief Narrative: Patient is a 87 year old male with history of dementia, hypertension, sick sinus syndrome with pacer, myasthenia gravis, coronary artery disease who presented from [facility] memory care to the emergency department with complaint of fall followed by pain in the right hip.  He was ambulating in his room with a walker when he fell and developed severe right hip pain, unable to walk.   Lab work showed AKI with creatinine of 1.6, leukocytosis.  CT head was negative for acute abnormalities, CT cervical spine did not show any fractures.  X-ray of the hip showed acute comminuted and displaced right intertrochanteric femur fracture.  Orthopedics consulted and he underwent ORIF/intramedullary nailing on 03/19/2021  Assessment & Plan:   Principal Problem:   Closed displaced intertrochanteric fracture of right femur ([place]) Active Problems:   Essential hypertension   Coronary artery disease involving native coronary artery of native heart without angina pectoris   SSS (sick sinus syndrome) (H[facility])   Myasthenia gravis ([place])   Late onset Alzheimer's disease with behavioral disturbance ([place])   Renal insufficiency   Hyponatremia   Hyperglycemia   Right hip fracture: Fell while ambulating with walker in his room at memory care.  X-ray showedacute comminuted and displaced right intertrochanteric femur fracture.  Orthopedics consulted and  he underwent ORIF/intramedullary nailing on 03/19/2021. Continue pain management, supportive care. On aspirin for dvt ppx.  PT/OT consultation done,recommended SNF on DC.  TOC following  Acute urinary retention/AKI: Creatinine was 1.09 in December 21.  Found to be acutely retaining urine in the emergency department.  Foley was placed in the emergency department.  On proscar.  Kidney function improved with IV  fluids.Foley removed  Respiratory insufficiency: Resolved.  He was requiring 2 L of oxygen per minute.  He does not use oxygen at baseline.  Chest x-ray showed Chronic interstitial lung disease without definite acute interval change since 08/26/2020.  Currently on room air  History of hypertension: Blood pressure soft on admission.  Now stable.  He takes Aldactone at home, currently on hold.  Other hypertensives on hold  History of myasthenia gravis: On chronic steroid use.  On prednisone, Mestinon at home.  Hydrocortisone ordered perioperatively  History of dementia/hospital-acquired delirium: Patient noted to be agitated this morning, disoriented.  He denied opening the curtains when I try to make the room bright with day sunlight.  Started on Seroquel 25 mg daily.  Monitor mental status.  Coronary artery disease: No anginal symptoms.  Takes aspirin at home.  Also on beta-blocker which is on hold due to low blood pressure on admisison  Hyperglycemia: Continue sliding scale insulin.  Recent hemoglobin A1c of 6.1% as per August 2021.  Laceration of left leg: Wound care consulted  Leukocytosis: Likely from steroid therapy.  Continue to monitor  Normocytic anemia/thrombocytopenia: Hemoglobin dropped from 14 to 8 today most likely secondary to hemodilution from IV fluids and surgery.  Continue to monitor.  Continue to monitor platelets.  We will transfuse if hemoglobin drops less than 7.  Hyponatremia: Continue to monitor.    Nutrition Problem: Increased nutrient needs Etiology: post-op healing      DVT prophylaxis: aspirin Code Status: Full code Family Communication:Called daughter on phone,call not received Status is: Inpatient  Remains inpatient appropriate because:Unsafe d/c plan  Dispo: The patient is from:  Memory care  Anticipated d/c is to: SNF              Patient currently is not medically stable to d/c.   Difficult to place patient No     Consultants:  Orthopedics  Procedures:ORIF  Antimicrobials:  Anti-infectives (From admission, onward)    Start     Dose/Rate Route Frequency Ordered Stop   03/19/21 1230  ceFAZolin (ANCEF) IVPB 2g/100 mL premix        2 g 200 mL/hr over 30 Minutes Intravenous Every 6 hours 03/19/21 1140 03/19/21 1906   03/19/21 0800  ceFAZolin (ANCEF) IVPB 2g/100 mL premix        2 g 200 mL/hr over 30 Minutes Intravenous To ShortStay Surgical 03/19/21 0157 03/19/21 0922   03/19/21 0600  ceFAZolin (ANCEF) IVPB 2g/100 mL premix  Status:  Discontinued        2 g 200 mL/hr over 30 Minutes Intravenous On call to O.R. 03/19/21 0150 03/19/21 0157       Subjective:  Patient seen and examined at the bedside this morning.  On room air.  Denies any complaints.  Pain well controlled.   Objective: Vitals:   03/20/21 1707 03/20/21 2048 03/21/21 0552 03/21/21 0747  BP: (!) 124/57 (!) 105/56 (!) 114/97 114/64  Pulse: 83 82 77 83  Resp:  20 (!) 21 14  Temp: (!) 97.5 F (36.4 C) 98.9 F (37.2 C) 98.4 F (36.9 C) 97.6 F (36.4 C)  TempSrc: Oral   Oral  SpO2: 97% 99% 96% 98%  Weight:      Height:        Intake/Output Summary (Last 24 hours) at 03/21/2021 1106 Last data filed at 03/21/2021 0600 Gross per 24 hour  Intake 1157.13 ml  Output 1600 ml  Net -442.87 ml   Filed Weights   03/19/21 0847  Weight: 73.5 kg    Examination:  General exam: Disoriented, mildly agitated today. HEENT: PERRL Respiratory system:  no wheezes or crackles  Cardiovascular system: S1 & S2 heard, RRR.  Gastrointestinal system: Abdomen is nondistended, soft and nontender. Central nervous system: Alert and awake,not oriented Extremities: No edema, no clubbing ,no cyanosis, clean surgical wound on the right hip Skin: No rashes, no ulcers,no icterus       Data Reviewed: I have personally reviewed following labs and imaging studies  CBC: Recent Labs  Lab 03/18/21 2159 03/19/21 0635 03/20/21 0137 03/21/21 0105  WBC 21.1* 20.9*  16.9* 15.3*  NEUTROABS 18.0*  --   --   --   HGB 14.1 12.9* 9.3* 8.1*  HCT 42.2 39.5 27.4* 24.3*  MCV 97.7 99.7 97.5 98.0  PLT 197 187 135* 767*   Basic Metabolic Panel: Recent Labs  Lab 03/18/21 2159 03/19/21 0635 03/20/21 0137 03/21/21 0105  NA 130* 132* 130* 129*  K 5.1 5.4* 5.3* 4.6  CL 100 102 100 100  CO2 21* 22 22 24   GLUCOSE 170* 171* 129* 107*  BUN 42* 47* 38* 29*  CREATININE 1.61* 1.97* 1.55* 1.18  CALCIUM 9.1 8.7* 8.5* 8.1*   GFR: Estimated Creatinine Clearance: 40.5 mL/min (by C-G formula based on SCr of 1.18 mg/dL). Liver Function Tests: No results for input(s): AST, ALT, ALKPHOS, BILITOT, PROT, ALBUMIN in the last 168 hours. No results for input(s): LIPASE, AMYLASE in the last 168 hours. No results for input(s): AMMONIA in the last 168 hours. Coagulation Profile: No results for input(s): INR, PROTIME in the last 168 hours. Cardiac Enzymes: No results for input(s): CKTOTAL, CKMB, CKMBINDEX,  TROPONINI in the last 168 hours. BNP (last 3 results) No results for input(s): PROBNP in the last 8760 hours. HbA1C: Recent Labs    03/19/21 0334  HGBA1C 5.9*   CBG: Recent Labs  Lab 03/20/21 1602 03/20/21 1931 03/20/21 2335 03/21/21 0318 03/21/21 0747  GLUCAP 171* 131* 117* 113* 94   Lipid Profile: No results for input(s): CHOL, HDL, LDLCALC, TRIG, CHOLHDL, LDLDIRECT in the last 72 hours. Thyroid Function Tests: No results for input(s): TSH, T4TOTAL, FREET4, T3FREE, THYROIDAB in the last 72 hours. Anemia Panel: No results for input(s): VITAMINB12, FOLATE, FERRITIN, TIBC, IRON, RETICCTPCT in the last 72 hours. Sepsis Labs: No results for input(s): PROCALCITON, LATICACIDVEN in the last 168 hours.  Recent Results (from the past 240 hour(s))  Resp Panel by RT-PCR (Flu A&B, Covid) Nasopharyngeal Swab     Status: None   Collection Time: 03/18/21 10:01 PM   Specimen: Nasopharyngeal Swab; Nasopharyngeal(NP) swabs in vial transport medium  Result Value Ref Range  Status   SARS Coronavirus 2 by RT PCR NEGATIVE NEGATIVE Final    Comment: (NOTE) SARS-CoV-2 target nucleic acids are NOT DETECTED.  The SARS-CoV-2 RNA is generally detectable in upper respiratory specimens during the acute phase of infection. The lowest concentration of SARS-CoV-2 viral copies this assay can detect is 138 copies/mL. A negative result does not preclude SARS-Cov-2 infection and should not be used as the sole basis for treatment or other patient management decisions. A negative result may occur with  improper specimen collection/handling, submission of specimen other than nasopharyngeal swab, presence of viral mutation(s) within the areas targeted by this assay, and inadequate number of viral copies(<138 copies/mL). A negative result must be combined with clinical observations, patient history, and epidemiological information. The expected result is Negative.  Fact Sheet for Patients:  EntrepreneurPulse.com.au  Fact Sheet for Healthcare Providers:  IncredibleEmployment.be  This test is no t yet approved or cleared by the [place] FDA and  has been authorized for detection and/or diagnosis of SARS-CoV-2 by FDA under an Emergency Use Authorization (EUA). This EUA will remain  in effect (meaning this test can be used) for the duration of the COVID-19 declaration under Section 564(b)(1) of the Act, 21 U.S.C.section 360bbb-3(b)(1), unless the authorization is terminated  or revoked sooner.       Influenza A by PCR NEGATIVE NEGATIVE Final   Influenza B by PCR NEGATIVE NEGATIVE Final    Comment: (NOTE) The Xpert Xpress SARS-CoV-2/FLU/RSV plus assay is intended as an aid in the diagnosis of influenza from Nasopharyngeal swab specimens and should not be used as a sole basis for treatment. Nasal washings and aspirates are unacceptable for Xpert Xpress SARS-CoV-2/FLU/RSV testing.  Fact Sheet for  Patients: EntrepreneurPulse.com.au  Fact Sheet for Healthcare Providers: IncredibleEmployment.be  This test is not yet approved or cleared by the [place] FDA and has been authorized for detection and/or diagnosis of SARS-CoV-2 by FDA under an Emergency Use Authorization (EUA). This EUA will remain in effect (meaning this test can be used) for the duration of the COVID-19 declaration under Section 564(b)(1) of the Act, 21 U.S.C. section 360bbb-3(b)(1), unless the authorization is terminated or revoked.  Performed at [facility], [place] [address], [place], [place] [address]   Urine culture     Status: None   Collection Time: 03/19/21 12:05 AM   Specimen: Urine, Random  Result Value Ref Range Status   Specimen Description URINE, RANDOM  Final   Special Requests NONE  Final   Culture  Final    NO GROWTH Performed at [facility], [place] [address] [place], [place] [address]    Report Status 03/20/2021 FINAL  Final         Radiology Studies: No results found.      Scheduled Meds:  aspirin EC  325 mg Oral Q breakfast   Chlorhexidine Gluconate Cloth  6 each Topical Daily   docusate sodium  100 mg Oral BID   feeding supplement (GLUCERNA SHAKE)  237 mL Oral TID BM   finasteride  5 mg Oral Daily   hydrOXYzine  25 mg Intramuscular Once   insulin aspart  0-6 Units Subcutaneous Q4H   multivitamin with minerals  1 tablet Oral Daily   pantoprazole  40 mg Oral Daily   predniSONE  20 mg Oral Q breakfast   pyridostigmine  60 mg Oral 4 times per day   QUEtiapine  25 mg Oral QHS   Continuous Infusions:  methocarbamol (ROBAXIN) IV       LOS: 3 days    Time spent: 25 mins.More than 50% of that time was spent in counseling and/or coordination of care.      Shelly Coss, MD [facility] P7/12/2020, 11:06 AM

## 2021-03-21 NOTE — Progress Notes (Signed)
Physical Therapy Treatment Patient Details Name: [name] MRN: [id number] DOB: 1930-12-18 Today's Date: 03/21/2021    History of Present Illness Patient is a 87 year old male who presented from [facility] memory care to the emergency department with complaint of fall followed by pain in the right hip.  He was ambulating in his room with a walker when he fell and developed severe right hip pain, unable to walk; s/p IM Nail on 7/2, WBAT; with history of dementia, hypertension, sick sinus syndrome with pacer, myasthenia gravis, coronary artery disease    PT Comments    Continuing work on functional mobility and activity tolerance;  Co-session with OT; Still with pain limiting activity tolerance and weight bearing on R LE; Notably better sit to stand, though, with light mod assist of one person (without RW -- he clearly states he does not trust the RW); much more difficult with the dynamic weight shifts needed for a basic pivot transfer, and needed 2 person assist for safety; continue to recommend SNF for post-acute rehab   Follow Up Recommendations  SNF     Equipment Recommendations  Rolling walker with 5" wheels;3in1 (PT);Wheelchair (measurements PT);Wheelchair cushion (measurements PT)    Recommendations for Other Services       Precautions / Restrictions Precautions Precautions: Fall Restrictions RLE Weight Bearing: Weight bearing as tolerated    Mobility  Bed Mobility Overal bed mobility: Needs Assistance Bed Mobility: Supine to Sit     Supine to sit: Max assist;+2 for safety/equipment;HOB elevated     General bed mobility comments: Step by step cueing with good effort; min assist to help LEs off of the bed; Max assist to elevate trunk to sit, and to square off hips at EOB    Transfers Overall transfer level: Needs assistance Equipment used: Rolling walker (2 wheeled);1 person hand held assist Transfers: Sit to/from [facility] Sit to Stand: Mod  assist;+2 safety/equipment;+2 physical assistance Stand pivot transfers: Mod assist;Max assist;+2 physical assistance;+2 safety/equipment       General transfer comment: Pt mod +2 to rise into standing wtih rw; mod +1 for sit<>stand without rw. Pt is fearful of the rw becuase of recent falls/ max +2 for pivot due to weight shift  Ambulation/Gait                 Stairs             Wheelchair Mobility    Modified Rankin (Stroke Patients Only)       Balance     Sitting balance-Leahy Scale: Fair       Standing balance-Leahy Scale: Poor                              Cognition Arousal/Alertness: Awake/alert Behavior During Therapy: WFL for tasks assessed/performed Overall Cognitive Status: History of cognitive impairments - at baseline                                        Exercises      General Comments General comments (skin integrity, edema, etc.): HR elevated to 150 and SpO2 desatted to 85% after stand pivot to chair; Rn notified; Pt placed on 2L O2 nasal cannula; HR eased back to the 110s by end of session; Pt's daughter, Shirlean Mylar present and supportive      Pertinent Vitals/Pain Pain Assessment: Faces Faces  Pain Scale: Hurts whole lot Pain Location: R hip with movement and weight bearing Pain Descriptors / Indicators: Grimacing;Guarding Pain Intervention(s): Premedicated before session    Home Living                      Prior Function            PT Goals (current goals can now be found in the care plan section) Acute Rehab PT Goals Patient Stated Goal: sit up in the chair for awhile PT Goal Formulation: Patient unable to participate in goal setting Time For Goal Achievement: 04/03/21 Potential to Achieve Goals: Fair Progress towards PT goals: Progressing toward goals    Frequency    Min 2X/week      PT Plan Current plan remains appropriate    Co-evaluation PT/OT/SLP Co-Evaluation/Treatment:  Yes Reason for Co-Treatment: Complexity of the patient's impairments (multi-system involvement);For patient/therapist safety;To address functional/ADL transfers;Necessary to address cognition/behavior during functional activity PT goals addressed during session: Mobility/safety with mobility        AM-PAC PT "6 Clicks" Mobility   Outcome Measure  Help needed turning from your back to your side while in a flat bed without using bedrails?: A Lot Help needed moving from lying on your back to sitting on the side of a flat bed without using bedrails?: A Lot Help needed moving to and from a bed to a chair (including a wheelchair)?: A Lot Help needed standing up from a chair using your arms (e.g., wheelchair or bedside chair)?: A Lot Help needed to walk in hospital room?: Total Help needed climbing 3-5 steps with a railing? : Total 6 Click Score: 10    End of Session Equipment Utilized During Treatment: Gait belt Activity Tolerance: Patient limited by pain Patient left: in chair;with call bell/phone within reach;with chair alarm set;with family/visitor present Nurse Communication: Mobility status;Other (comment) (and incr HR) PT Visit Diagnosis: Unsteadiness on feet (R26.81);History of falling (Z91.81);Pain Pain - Right/Left: Right Pain - part of body: Hip     Time: 1210-1253 PT Time Calculation (min) (ACUTE ONLY): 43 min  Charges:  $Therapeutic Activity: 8-22 mins                     Roney Marion, PT  Acute Rehabilitation Services Pager [phone] Office [place] 03/21/2021, 5:09 PM

## 2021-03-21 NOTE — NC FL2 (Signed)
Fellows LEVEL OF CARE SCREENING TOOL     IDENTIFICATION  Patient Name: [name] Birthdate: 11-22-1930 Sex: male Admission Date (Current Location): 03/18/2021  [facility] and [place] Number:  Herbalist and Address:  [facility] [place], [place] [address], [place], [place] [address]      Provider Number: [id number]  Attending Physician Name and Address:  Shelly Coss, MD  Relative Name and Phone Number:  Raiford Simmonds (Daughter)   [phone] (Mobile)    Current Level of Care: Hospital Recommended Level of Care: [place] Prior Approval Number:    Date Approved/Denied:   PASRR Number: [id number]  Discharge Plan: SNF    Current Diagnoses: Patient Active Problem List   Diagnosis Date Noted   Hyperglycemia 03/19/2021   Closed displaced intertrochanteric fracture of right femur ([place]) 03/18/2021   Renal insufficiency 03/18/2021   Hyponatremia 03/18/2021   Late onset Alzheimer's disease with behavioral disturbance ([place]) 11/02/2020   Benign prostatic hyperplasia with urinary frequency 08/27/2020   Myasthenia gravis ([place]) 08/27/2020   AMS (altered mental status) 26/71/2458   Toxic metabolic encephalopathy 09/98/3382   Stroke-like symptom 05/12/2020   Leukocytosis 05/12/2020   Macrocytosis 05/12/2020   Stroke-like symptoms 05/12/2020   Closed fracture of right distal humerus 07/03/2019   Carotid artery disease ([place]) 09/09/2018   Pacemaker-St.Jude 07/03/2012   Bruit 09/28/2011   Tongue lesion 08/15/2011   Essential hypertension 06/23/2010   Mixed hyperlipidemia 09/22/2008   Coronary artery disease involving native coronary artery of native heart without angina pectoris 09/22/2008   Edema 09/22/2008   SSS (sick sinus syndrome) ([place]) 09/22/2008    Orientation RESPIRATION BLADDER Height & Weight     Self, Place  Normal Indwelling catheter Weight: 162 lb (73.5 kg) Height:  5\' 7"  (170.2 cm)  BEHAVIORAL  SYMPTOMS/MOOD NEUROLOGICAL BOWEL NUTRITION STATUS      Continent Diet (see d/c summary)  AMBULATORY STATUS COMMUNICATION OF NEEDS Skin   Extensive Assist Verbally Surgical wounds, Skin abrasions (Incision Right Thigh; skin tear right arm from fall; skin tear left leg from fall)                       Personal Care Assistance Level of Assistance  Bathing, Feeding, Dressing Bathing Assistance: Maximum assistance Feeding assistance: Independent Dressing Assistance: Maximum assistance     Functional Limitations Info  Sight, Hearing, Speech Sight Info: Adequate Hearing Info: Adequate Speech Info: Adequate    SPECIAL CARE FACTORS FREQUENCY  PT (By licensed PT), OT (By licensed OT)     PT Frequency: 5x/week OT Frequency: 5x/week            Contractures Contractures Info: Not present    Additional Factors Info  Code Status, Allergies Code Status Info: Full code Allergies Info: Cefuroxime Axetil, Cyclosporine, Elemental Sulfur, Sulfamethoxazole, sulfa antibiotics, Maxzide (triamterene-hctz), Sulfamethoxazole-trimethoprim, prednisone           Current Medications (03/21/2021):  This is the current hospital active medication list Current Facility-Administered Medications  Medication Dose Route Frequency Provider Last Rate Last Admin   acetaminophen (TYLENOL) tablet 650 mg  650 mg Oral Q6H PRN Shelly Coss, MD   650 mg at 03/21/21 0813   aspirin EC tablet 325 mg  325 mg Oral Q breakfast Nicholes Stairs, MD   325 mg at 03/21/21 0810   Chlorhexidine Gluconate Cloth 2 % PADS 6 each  6 each Topical Daily Opyd, Ilene Qua, MD   6 each at 03/21/21  7106   docusate sodium (COLACE) capsule 100 mg  100 mg Oral BID Nicholes Stairs, MD   100 mg at 03/21/21 0839   feeding supplement (GLUCERNA SHAKE) (GLUCERNA SHAKE) liquid 237 mL  237 mL Oral TID BM Nicholes Stairs, MD   237 mL at 03/21/21 0839   finasteride (PROSCAR) tablet 5 mg  5 mg Oral Daily Nicholes Stairs,  MD   5 mg at 03/21/21 0830   hydrOXYzine (VISTARIL) injection 25 mg  25 mg Intramuscular Once Lovey Newcomer T, NP       insulin aspart (novoLOG) injection 0-6 Units  0-6 Units Subcutaneous Q4H Nicholes Stairs, MD   1 Units at 03/20/21 1703   LORazepam (ATIVAN) tablet 0.5 mg  0.5 mg Oral QHS PRN Nicholes Stairs, MD   0.5 mg at 03/21/21 0005   menthol-cetylpyridinium (CEPACOL) lozenge 3 mg  1 lozenge Oral PRN Nicholes Stairs, MD       Or   phenol [facility]) mouth spray 1 spray  1 spray Mouth/Throat PRN Nicholes Stairs, MD       methocarbamol (ROBAXIN) tablet 500 mg  500 mg Oral Q6H PRN Nicholes Stairs, MD   500 mg at 03/21/21 0815   Or   methocarbamol (ROBAXIN) 500 mg in dextrose 5 % 50 mL IVPB  500 mg Intravenous Q6H PRN Nicholes Stairs, MD       metoCLOPramide (REGLAN) tablet 5-10 mg  5-10 mg Oral Q8H PRN Nicholes Stairs, MD       Or   metoCLOPramide (REGLAN) injection 5-10 mg  5-10 mg Intravenous Q8H PRN Nicholes Stairs, MD       multivitamin with minerals tablet 1 tablet  1 tablet Oral Daily Nicholes Stairs, MD   1 tablet at 03/21/21 0839   ondansetron (ZOFRAN) tablet 4 mg  4 mg Oral Q6H PRN Nicholes Stairs, MD       Or   ondansetron [facility]) injection 4 mg  4 mg Intravenous Q6H PRN Nicholes Stairs, MD       oxyCODONE (Oxy IR/ROXICODONE) immediate release tablet 5 mg  5 mg Oral Q6H PRN Shelly Coss, MD   5 mg at 03/21/21 0550   pantoprazole (PROTONIX) EC tablet 40 mg  40 mg Oral Daily Nicholes Stairs, MD   40 mg at 03/21/21 0839   predniSONE (DELTASONE) tablet 20 mg  20 mg Oral Q breakfast Nicholes Stairs, MD   20 mg at 03/21/21 0809   pyridostigmine (MESTINON) tablet 60 mg  60 mg Oral 4 times per day Nicholes Stairs, MD   60 mg at 03/21/21 2694   senna-docusate (Senokot-S) tablet 1 tablet  1 tablet Oral QHS PRN Nicholes Stairs, MD         Discharge Medications: Please see discharge summary  for a list of discharge medications.  Relevant Imaging Results:  Relevant Lab Results:   Additional Information SS# [id number] Pt is covid vaccinated with 1 booster  [place], LCSW

## 2021-03-21 NOTE — Progress Notes (Signed)
BRELAND TROUTEN  MRN: [id number] DOB/Age: 85/03/1931 85 y.o. Physician Assistant: Cherlynn June PA-C 2 Days Post-Op Procedure(s) (LRB): INTRAMEDULLARY (IM) NAIL ANTERIOR TROCHANTRIC (Right)  Subjective: Resting comfortably in bed.  Denies hip pain. Vital Signs Temp:  [97.5 F (36.4 C)-98.9 F (37.2 C)] 97.6 F (36.4 C) (07/04 0747) Pulse Rate:  [77-83] 83 (07/04 0747) Resp:  [14-21] 14 (07/04 0747) BP: (105-124)/(56-97) 114/64 (07/04 0747) SpO2:  [96 %-99 %] 98 % (07/04 0747)  Lab Results Recent Labs    03/20/21 0137 03/21/21 0105  WBC 16.9* 15.3*  HGB 9.3* 8.1*  HCT 27.4* 24.3*  PLT 135* 122*    BMET Recent Labs    03/20/21 0137 03/21/21 0105  NA 130* 129*  K 5.3* 4.6  CL 100 100  CO2 22 24  GLUCOSE 129* 107*  BUN 38* 29*  CREATININE 1.55* 1.18  CALCIUM 8.5* 8.1*    INR  Date Value Ref Range Status  05/12/2020 1.2 0.8 - 1.2 Final    Comment:    (NOTE) INR goal varies based on device and disease states. Performed at [facility], [place] [address] [place], [place] [address]      Exam  Right hip incision shows the dressing to be intact, clean and dry.  Thigh compartments soft.  Grossly neurovascular intact distally in the right lower extremity.  Plan Continue postop care as per Dr. Stann Mainland: Patient may be weightbearing as tolerated.  Continue DVT prophylaxis as previously outlined.  Ongoing medical management per the hospitalist service Dorothyann Peng 03/21/2021, 10:42 AM   Contact # [phone]

## 2021-03-21 NOTE — Progress Notes (Signed)
Occupational Therapy Evaluation Patient Details Name: [name] MRN: [id number] DOB: [birth year] Today's Date: 03/21/2021    History of Present Illness Patient is a 87 year old male who presented from [facility] memory care to the emergency department with complaint of fall followed by pain in the right hip.  He was ambulating in his room with a walker when he fell and developed severe right hip pain, unable to walk; s/p IM Nail on 7/2, WBAT; with history of dementia, hypertension, sick sinus syndrome with pacer, myasthenia gravis, coronary artery disease   Clinical Impression   Jemarcus was evaluated s/p the above and R hip IM nail placement. PTA pt was living at [facility] facility where he received minA for ADLs and mobility with reports of some falls. Upon evaluation pt was mod/max +2 for all mobility, minA for upper ADLs and max/totalA for lower body ADLs. Pt does well with 1 person assist to sit<>stand, as he is fearful of the rw because it has led to falls in the past. Pt benefits from simple one step verbal cues throughout for sequencing functional tasks. Pt benefits from OT acutely to progress function in ADLs and mobility. Recommend d/c to SNF.    Follow Up Recommendations  SNF;Supervision/Assistance - 24 hour    Equipment Recommendations  None recommended by OT       Precautions / Restrictions Precautions Precautions: Fall Restrictions Weight Bearing Restrictions: Yes RLE Weight Bearing: Weight bearing as tolerated      Mobility Bed Mobility Overal bed mobility: Needs Assistance Bed Mobility: Supine to Sit     Supine to sit: Max assist;+2 for safety/equipment;HOB elevated     General bed mobility comments: Step by step cueing with good effort; min assist to help LEs off of the bed; Max assist to elevate trunk to sit, and to square off hips at EOB    Transfers Overall transfer level: Needs assistance Equipment used: Rolling walker (2  wheeled) Transfers: Sit to/from Stand Sit to Stand: Mod assist;+2 safety/equipment;+2 physical assistance         General transfer comment: Pt mod +2 to rise into standing wtih rw; mod +1 for sit<>stand without rw. Pt is fearful of the rw becuase of recent falls/ max +2 for pivot due to weight shift    Balance Overall balance assessment: Needs assistance Sitting-balance support: Feet supported;Single extremity supported Sitting balance-Leahy Scale: Fair       Standing balance-Leahy Scale: Poor             ADL either performed or assessed with clinical judgement   ADL Overall ADL's : Needs assistance/impaired Eating/Feeding: Set up;Sitting   Grooming: Wash/dry hands;Wash/dry face;Oral care;Brushing hair;Applying deodorant;Minimal assistance;Sitting   Upper Body Bathing: Moderate assistance;Sitting   Lower Body Bathing: Maximal assistance;+2 for physical assistance;+2 for safety/equipment;Sit to/from stand   Upper Body Dressing : Minimal assistance;Sitting   Lower Body Dressing: +2 for physical assistance;+2 for safety/equipment;Total assistance;Sit to/from stand   Toilet Transfer: Maximal assistance;+2 for physical assistance;+2 for safety/equipment;BSC;Stand-pivot   Toileting- Clothing Manipulation and Hygiene: Total assistance;+2 for physical assistance;+2 for safety/equipment       Functional mobility during ADLs: Maximal assistance;+2 for physical assistance;+2 for safety/equipment General ADL Comments: max/total ADLs +2 for sit<>stand and functional activity in [place]      Pertinent Vitals/Pain Pain Assessment: Faces Faces Pain Scale: Hurts whole lot Pain Location: R hip with movement and weight bearing Pain Descriptors / Indicators: Grimacing;Guarding Pain Intervention(s): Premedicated before session     Hand  Dominance     Extremity/Trunk Assessment Upper Extremity Assessment Upper Extremity Assessment: Generalized weakness;Overall [facility] for tasks  assessed   Lower Extremity Assessment Lower Extremity Assessment: Defer to PT evaluation RLE Deficits / Details: `   Cervical / Trunk Assessment Cervical / Trunk Assessment: Kyphotic   Communication Communication Communication: HOH   Cognition Arousal/Alertness: Awake/alert Behavior During Therapy: WFL for tasks assessed/performed Overall Cognitive Status: History of cognitive impairments - at baseline                General Comments  HR elevated to 150 and SpO2 desatted to 85% after stand pivot to chair. Pt placed on 2L O2 nasal canual            Home Living Family/patient expects to be discharged to:: Assisted living ([place])   Available Help at Discharge: Other (Comment) (staff at ALF)                         Home Equipment: Shower seat - built in;Grab bars - tub/shower;Walker - 4 wheels   Additional Comments: PLOF confirmed with daughter, Shirlean Mylar      Prior Functioning/Environment Level of Independence: Needs assistance  Gait / Transfers Assistance Needed: With a RW; perhaps with a Rollator -- pt states he keeps falling with the particular RW he was using ADL's / Homemaking Assistance Needed: Staff assist (pt reports he showers himself with an "observer") Communication / Swallowing Assistance Needed: Advanced dementia Comments: PLOF confirmed with daughter        OT Problem List: Decreased strength;Decreased range of motion;Decreased activity tolerance;Impaired balance (sitting and/or standing);Decreased cognition;Decreased safety awareness;Decreased knowledge of use of DME or AE;Decreased knowledge of precautions;Pain      OT Treatment/Interventions: Self-care/ADL training;Therapeutic exercise;DME and/or AE instruction;Therapeutic activities;Patient/family education    OT Goals(Current goals can be found in the care plan section) Acute Rehab OT Goals Patient Stated Goal: sit up in the chair for awhile OT Goal Formulation:  With patient Time For Goal Achievement: 04/04/21 Potential to Achieve Goals: Fair ADL Goals Pt Will Perform Lower Body Bathing: with min assist;sit to/from stand Pt Will Perform Lower Body Dressing: with min assist;sit to/from stand Pt Will Transfer to Toilet: with min guard assist;ambulating;bedside commode Pt Will Perform Toileting - Clothing Manipulation and hygiene: with supervision;sit to/from stand  OT Frequency: Min 2X/week           Co-evaluation PT/OT/SLP Co-Evaluation/Treatment: Yes Reason for Co-Treatment: Complexity of the patient's impairments (multi-system involvement);Necessary to address cognition/behavior during functional activity;For patient/therapist safety;To address functional/ADL transfers   OT goals addressed during session: ADL's and self-care      AM-PAC OT "6 Clicks" Daily Activity     Outcome Measure Help from another person eating meals?: A Little Help from another person taking care of personal grooming?: A Little Help from another person toileting, which includes using toliet, bedpan, or urinal?: A Lot Help from another person bathing (including washing, rinsing, drying)?: A Lot Help from another person to put on and taking off regular upper body clothing?: A Little Help from another person to put on and taking off regular lower body clothing?: A Lot 6 Click Score: 15   End of Session Equipment Utilized During Treatment: Gait belt;Rolling walker;Oxygen Nurse Communication: Mobility status;Weight bearing status;Precautions  Activity Tolerance: Patient tolerated treatment well;Patient limited by pain Patient left: in chair;with chair alarm set;with family/visitor present;with call bell/phone within reach  OT Visit Diagnosis: Unsteadiness on feet (R26.81);Other abnormalities of  gait and mobility (R26.89);Repeated falls (R29.6);Muscle weakness (generalized) (M62.81);History of falling (Z91.81);Pain;Other symptoms and signs involving cognitive function                 Time: 1210-1252 OT Time Calculation (min): 42 min Charges:  OT General Charges $OT Visit: 1 Visit OT Evaluation $OT Eval Moderate Complexity: 1 Mod OT Treatments $Self Care/Home Management : 8-22 mins    Luciana Cammarata A Faten Frieson 03/21/2021, 1:17 PM

## 2021-03-22 ENCOUNTER — Encounter (HOSPITAL_COMMUNITY): Payer: Medicare Other

## 2021-03-22 LAB — BASIC METABOLIC PANEL
Anion gap: 5 (ref 5–15)
BUN: 21 mg/dL (ref 8–23)
CO2: 26 mmol/L (ref 22–32)
Calcium: 8.4 mg/dL — ABNORMAL LOW (ref 8.9–10.3)
Chloride: 101 mmol/L (ref 98–111)
Creatinine, Ser: 1.07 mg/dL (ref 0.61–1.24)
GFR, Estimated: >60 mL/min (ref >60)
Glucose, Bld: 107 mg/dL — ABNORMAL HIGH (ref 70–99)
Potassium: 4.5 mmol/L (ref 3.5–5.1)
Sodium: 132 mmol/L — ABNORMAL LOW (ref 135–145)

## 2021-03-22 LAB — GLUCOSE, CAPILLARY
Glucose-Capillary: 118 mg/dL — ABNORMAL HIGH (ref 70–99)
Glucose-Capillary: 138 mg/dL — ABNORMAL HIGH (ref 70–99)
Glucose-Capillary: 142 mg/dL — ABNORMAL HIGH (ref 70–99)
Glucose-Capillary: 143 mg/dL — ABNORMAL HIGH (ref 70–99)
Glucose-Capillary: 154 mg/dL — ABNORMAL HIGH (ref 70–99)
Glucose-Capillary: 169 mg/dL — ABNORMAL HIGH (ref 70–99)
Glucose-Capillary: 93 mg/dL (ref 70–99)
Glucose-Capillary: 97 mg/dL (ref 70–99)

## 2021-03-22 LAB — CBC
HCT: 23.9 % — ABNORMAL LOW (ref 39.0–52.0)
Hemoglobin: 7.9 g/dL — ABNORMAL LOW (ref 13.0–17.0)
MCH: 32.8 pg (ref 26.0–34.0)
MCHC: 33.1 g/dL (ref 30.0–36.0)
MCV: 99.2 fL (ref 80.0–100.0)
Platelets: 114 10*3/uL — ABNORMAL LOW (ref 150–400)
RBC: 2.41 MIL/uL — ABNORMAL LOW (ref 4.22–5.81)
RDW: 16 % — ABNORMAL HIGH (ref 11.5–15.5)
WBC: 10.8 10*3/uL — ABNORMAL HIGH (ref 4.0–10.5)
nRBC: 0 % (ref 0.0–0.2)

## 2021-03-22 MED ORDER — OXYCODONE HCL 5 MG PO TABS: 5.0000 mg | ORAL_TABLET | Freq: Four times a day (QID) | ORAL | 0 refills | Status: AC | PRN

## 2021-03-22 MED ORDER — RISPERIDONE 0.5 MG PO TABS
0.5000 mg | ORAL_TABLET | Freq: Every day | ORAL | Status: DC
  Filled 2021-03-22: qty 1

## 2021-03-22 MED ORDER — RISPERIDONE 0.5 MG PO TABS
0.5000 mg | ORAL_TABLET | Freq: Every evening | ORAL | Status: DC | PRN
  Administered 2021-03-22: 0.5 mg via ORAL
  Filled 2021-03-22 (×2): qty 1

## 2021-03-22 MED ORDER — LORAZEPAM 0.5 MG PO TABS: 0.5000 mg | ORAL_TABLET | Freq: Every evening | ORAL | 0 refills | Status: AC | PRN

## 2021-03-22 NOTE — TOC Progression Note (Addendum)
Transition of Care [facility]) - Progression Note    Patient Details  Name: YAZID POP MRN: [id number] Date of Birth: 06-May-1931  Transition of Care [facility]) CM/SW [place],  Phone Number: 03/22/2021, 10:12 AM  Clinical Narrative:     Daughter requested [facility]. CSW called and left message with Vickii Chafe at [phone] requesting return call  [phone]: Called Peggy again; no answer   Expected Discharge Plan: [place] Barriers to Discharge: Continued Medical Work up  Expected Discharge Plan and Services Expected Discharge Plan: [place] Choice: [place] arrangements for the past 2 months: [place] (Memory care)                                       Social Determinants of Health (SDOH) Interventions    Readmission Risk Interventions No flowsheet data found.

## 2021-03-22 NOTE — Progress Notes (Signed)
Unable to void bladder scan showed 556- got a urine return of 575-placed 31fr indwelling cath- no complications-pt expressed immediate relief

## 2021-03-22 NOTE — Progress Notes (Deleted)
POCT CBG not crossing into Epic.   CBG: 138 @ 2017.

## 2021-03-22 NOTE — Progress Notes (Signed)
PROGRESS NOTE    [name]  YQI:[id number] DOB: 10-11-1930 DOA: 03/18/2021 PCP: Shirline Frees, MD   Chief Complain: Fall, right hip pain  Brief Narrative: Patient is a 87 year old male with history of dementia, hypertension, sick sinus syndrome with pacer, myasthenia gravis, coronary artery disease who presented from [facility] memory care to the emergency department with complaint of fall followed by pain in the right hip.  He was ambulating in his room with a walker when he fell and developed severe right hip pain, unable to walk.   Lab work on presentation showed AKI with creatinine of 1.6, leukocytosis.  CT head was negative for acute abnormalities, CT cervical spine did not show any fractures.  X-ray of the hip showed acute comminuted and displaced right intertrochanteric femur fracture.  Orthopedics consulted and he underwent ORIF/intramedullary nailing on 03/19/2021.  Plan for skilled nursing facility placement.  Patient is medically stable for discharge as soon as bed is available.  Assessment & Plan:   Principal Problem:   Closed displaced intertrochanteric fracture of right femur ([place]) Active Problems:   Essential hypertension   Coronary artery disease involving native coronary artery of native heart without angina pectoris   SSS (sick sinus syndrome) (HCC)   Myasthenia gravis ([place])   Late onset Alzheimer's disease with behavioral disturbance ([place])   Renal insufficiency   Hyponatremia   Hyperglycemia   Right hip fracture: Fell while ambulating with walker in his room at memory care.  X-ray showedacute comminuted and displaced right intertrochanteric femur fracture.  Orthopedics consulted and  he underwent ORIF/intramedullary nailing on 03/19/2021. Continue pain management, supportive care. On aspirin for dvt ppx.  PT/OT consultation done,recommended SNF on DC.  TOC following  Acute urinary retention/AKI: Creatinine was 1.09 in December 21.  Found to be acutely retaining  urine in the emergency department.  Foley was placed in the emergency department.  On proscar.  Kidney function improved with IV fluids.We will give a voiding trial today.  Respiratory insufficiency: Resolved.  He was requiring 2 L of oxygen per minute.  He does not use oxygen at baseline.  Chest x-ray showed Chronic interstitial lung disease without definite acute interval change since 08/26/2020.  Currently on room air  History of hypertension: Blood pressure soft on admission.  He takes Aldactone at home, currently on hold.  Other hypertensives on hold.  Will restart if he continues to be hypertensive.  History of myasthenia gravis: On chronic steroid use.  On prednisone, Mestinon at home.   History of dementia/hospital-acquired delirium: Patient was noted to be agitated in the mrng of 03/21/21, disoriented.   Started on Seroquel 25 mg daily.  Monitor mental status.  Continue delirium precautions  Coronary artery disease: No anginal symptoms.  Takes aspirin at home.  Also on beta-blocker which is on hold due to low blood pressure on admisison  Hyperglycemia: Continue sliding scale insulin.  Recent hemoglobin A1c of 6.1% as per August 2021.  Laceration of left leg: Wound care was consulted  Leukocytosis: Likely from steroid therapy.  Continue to monitor  Normocytic anemia/thrombocytopenia: Hemoglobin dropped from 14 to 7.9  today most likely secondary to hemodilution from IV fluids and surgery.  Continue to monitor.  We will transfuse if hemoglobin drops less than 7.  Hyponatremia: Continue to monitor.    Nutrition Problem: Increased nutrient needs Etiology: post-op healing      DVT prophylaxis: aspirin Code Status: Full code Family Communication:Called daughter on phone on 03/21/21 Status is: Inpatient  Remains  inpatient appropriate because:Unsafe d/c plan  Dispo: The patient is from:  Memory care              Anticipated d/c is to: SNF              Patient currently is  medically stable for dc   Difficult to place patient No     Consultants: Orthopedics  Procedures:ORIF  Antimicrobials:  Anti-infectives (From admission, onward)    Start     Dose/Rate Route Frequency Ordered Stop   03/19/21 1230  ceFAZolin (ANCEF) IVPB 2g/100 mL premix        2 g 200 mL/hr over 30 Minutes Intravenous Every 6 hours 03/19/21 1140 03/19/21 1906   03/19/21 0800  ceFAZolin (ANCEF) IVPB 2g/100 mL premix        2 g 200 mL/hr over 30 Minutes Intravenous To ShortStay Surgical 03/19/21 0157 03/19/21 0922   03/19/21 0600  ceFAZolin (ANCEF) IVPB 2g/100 mL premix  Status:  Discontinued        2 g 200 mL/hr over 30 Minutes Intravenous On call to O.R. 03/19/21 0150 03/19/21 0157       Subjective:  Patient seen and examined the bedside this morning.  He was calm and cooperative today.  Denies any pain on the surgical site.  He was still on  Foley catheter.  Denies any new complaints today.  Objective: Vitals:   03/21/21 1657 03/21/21 2034 03/22/21 0441 03/22/21 0734  BP: (!) 142/94 (!) 146/66 139/72 (!) 159/88  Pulse: 86 87 78 (!) 107  Resp: 17 18 16 17   Temp:  98.5 F (36.9 C) 98.8 F (37.1 C) 98.4 F (36.9 C)  TempSrc:  Oral Oral Oral  SpO2: 95% 95% 96% 96%  Weight:      Height:        Intake/Output Summary (Last 24 hours) at 03/22/2021 0754 Last data filed at 03/22/2021 0459 Gross per 24 hour  Intake --  Output 1475 ml  Net -1475 ml   Filed Weights   03/19/21 0847  Weight: 73.5 kg    Examination:  General exam: Overall comfortable, not in distress, very deconditioned, elderly male HEENT: PERRL Respiratory system:  no wheezes or crackles  Cardiovascular system: S1 & S2 heard, RRR.  Gastrointestinal system: Abdomen is nondistended, soft and nontender. Central nervous system: Alert and awake but not oriented Extremities: No edema, no clubbing ,no cyanosis, clean surgical wound on the right hip Skin: Left leg wrapped with dressing GU: Foley   Data  Reviewed: I have personally reviewed following labs and imaging studies  CBC: Recent Labs  Lab 03/18/21 2159 03/19/21 0635 03/20/21 0137 03/21/21 0105 03/22/21 0335  WBC 21.1* 20.9* 16.9* 15.3* 10.8*  NEUTROABS 18.0*  --   --   --   --   HGB 14.1 12.9* 9.3* 8.1* 7.9*  HCT 42.2 39.5 27.4* 24.3* 23.9*  MCV 97.7 99.7 97.5 98.0 99.2  PLT 197 187 135* 122* 283*   Basic Metabolic Panel: Recent Labs  Lab 03/18/21 2159 03/19/21 0635 03/20/21 0137 03/21/21 0105 03/22/21 0335  NA 130* 132* 130* 129* 132*  K 5.1 5.4* 5.3* 4.6 4.5  CL 100 102 100 100 101  CO2 21* 22 22 24 26   GLUCOSE 170* 171* 129* 107* 107*  BUN 42* 47* 38* 29* 21  CREATININE 1.61* 1.97* 1.55* 1.18 1.07  CALCIUM 9.1 8.7* 8.5* 8.1* 8.4*   GFR: Estimated Creatinine Clearance: 44.6 mL/min (by C-G formula based on SCr of 1.07 mg/dL).  Liver Function Tests: No results for input(s): AST, ALT, ALKPHOS, BILITOT, PROT, ALBUMIN in the last 168 hours. No results for input(s): LIPASE, AMYLASE in the last 168 hours. No results for input(s): AMMONIA in the last 168 hours. Coagulation Profile: No results for input(s): INR, PROTIME in the last 168 hours. Cardiac Enzymes: No results for input(s): CKTOTAL, CKMB, CKMBINDEX, TROPONINI in the last 168 hours. BNP (last 3 results) No results for input(s): PROBNP in the last 8760 hours. HbA1C: No results for input(s): HGBA1C in the last 72 hours.  CBG: Recent Labs  Lab 03/21/21 1655 03/21/21 2017 03/22/21 0025 03/22/21 0503 03/22/21 0735  GLUCAP 148* 138* 118* 97 93   Lipid Profile: No results for input(s): CHOL, HDL, LDLCALC, TRIG, CHOLHDL, LDLDIRECT in the last 72 hours. Thyroid Function Tests: No results for input(s): TSH, T4TOTAL, FREET4, T3FREE, THYROIDAB in the last 72 hours. Anemia Panel: No results for input(s): VITAMINB12, FOLATE, FERRITIN, TIBC, IRON, RETICCTPCT in the last 72 hours. Sepsis Labs: No results for input(s): PROCALCITON, LATICACIDVEN in the last  168 hours.  Recent Results (from the past 240 hour(s))  Resp Panel by RT-PCR (Flu A&B, Covid) Nasopharyngeal Swab     Status: None   Collection Time: 03/18/21 10:01 PM   Specimen: Nasopharyngeal Swab; Nasopharyngeal(NP) swabs in vial transport medium  Result Value Ref Range Status   SARS Coronavirus 2 by RT PCR NEGATIVE NEGATIVE Final    Comment: (NOTE) SARS-CoV-2 target nucleic acids are NOT DETECTED.  The SARS-CoV-2 RNA is generally detectable in upper respiratory specimens during the acute phase of infection. The lowest concentration of SARS-CoV-2 viral copies this assay can detect is 138 copies/mL. A negative result does not preclude SARS-Cov-2 infection and should not be used as the sole basis for treatment or other patient management decisions. A negative result may occur with  improper specimen collection/handling, submission of specimen other than nasopharyngeal swab, presence of viral mutation(s) within the areas targeted by this assay, and inadequate number of viral copies(<138 copies/mL). A negative result must be combined with clinical observations, patient history, and epidemiological information. The expected result is Negative.  Fact Sheet for Patients:  EntrepreneurPulse.com.au  Fact Sheet for Healthcare Providers:  IncredibleEmployment.be  This test is no t yet approved or cleared by the [place] FDA and  has been authorized for detection and/or diagnosis of SARS-CoV-2 by FDA under an Emergency Use Authorization (EUA). This EUA will remain  in effect (meaning this test can be used) for the duration of the COVID-19 declaration under Section 564(b)(1) of the Act, 21 U.S.C.section 360bbb-3(b)(1), unless the authorization is terminated  or revoked sooner.       Influenza A by PCR NEGATIVE NEGATIVE Final   Influenza B by PCR NEGATIVE NEGATIVE Final    Comment: (NOTE) The Xpert Xpress SARS-CoV-2/FLU/RSV plus assay is  intended as an aid in the diagnosis of influenza from Nasopharyngeal swab specimens and should not be used as a sole basis for treatment. Nasal washings and aspirates are unacceptable for Xpert Xpress SARS-CoV-2/FLU/RSV testing.  Fact Sheet for Patients: EntrepreneurPulse.com.au  Fact Sheet for Healthcare Providers: IncredibleEmployment.be  This test is not yet approved or cleared by the [place] FDA and has been authorized for detection and/or diagnosis of SARS-CoV-2 by FDA under an Emergency Use Authorization (EUA). This EUA will remain in effect (meaning this test can be used) for the duration of the COVID-19 declaration under Section 564(b)(1) of the Act, 21 U.S.C. section 360bbb-3(b)(1), unless the authorization is terminated or revoked.  Performed at [facility], [place] [address] [place], [place] [address]   Urine culture     Status: None   Collection Time: 03/19/21 12:05 AM   Specimen: Urine, Random  Result Value Ref Range Status   Specimen Description URINE, RANDOM  Final   Special Requests NONE  Final   Culture   Final    NO GROWTH Performed at [facility], [place] [address], [place], [place] [address]    Report Status 03/20/2021 FINAL  Final         Radiology Studies: No results found.      Scheduled Meds:  aspirin EC  325 mg Oral Q breakfast   Chlorhexidine Gluconate Cloth  6 each Topical Daily   docusate sodium  100 mg Oral BID   feeding supplement (GLUCERNA SHAKE)  237 mL Oral TID BM   finasteride  5 mg Oral Daily   hydrOXYzine  25 mg Intramuscular Once   insulin aspart  0-6 Units Subcutaneous Q4H   multivitamin with minerals  1 tablet Oral Daily   pantoprazole  40 mg Oral Daily   predniSONE  20 mg Oral Q breakfast   pyridostigmine  60 mg Oral 4 times per day   QUEtiapine  25 mg Oral QHS   Continuous Infusions:  methocarbamol (ROBAXIN) IV       LOS: 4 days    Time spent: 25 mins.More  than 50% of that time was spent in counseling and/or coordination of care.      Shelly Coss, MD [facility] P7/01/2021, 7:54 AM

## 2021-03-22 NOTE — TOC Progression Note (Addendum)
Transition of Care [facility]) - Progression Note    Patient Details  Name: [name] MRN: [id number] Date of Birth: 18-Jun-1931  Transition of Care [facility]) CM/SW [place], [place] Phone Number: 03/22/2021, 1:50 PM  Clinical Narrative:     Spoke with pt's daughter and provided bed offers over the phone. She will discuss with her sister and notify CSW with choice.   1459: Neither [facility] or [facility] have a bed  Expected Discharge Plan: [place] Barriers to Discharge: Continued Medical Work up  Expected Discharge Plan and Services Expected Discharge Plan: [place] Choice: [place] arrangements for the past 2 months: [place] (Memory care)                                       Social Determinants of Health (SDOH) Interventions    Readmission Risk Interventions No flowsheet data found.

## 2021-03-22 NOTE — Plan of Care (Signed)
Pt developed confusion and agitation this shift @ approx 0000 and made several attempts to exit bed jeopardizing pt safety. Staff attempted to re-orient and re-direct pt with little success. PRN anxiolytic and analgesic given which had therapeutic effect. Will continue to attempt to re-orient pt this AM and educate on fall prevention strategies. Delirium prevention strategies such as encouraging healthy sleep pattern and pain/anxiety management will also be implemented.   Problem: Education: Goal: Knowledge of General Education information will improve Description: Including pain rating scale, medication(s)/side effects and non-pharmacologic comfort measures Outcome: Progressing   Problem: Health Behavior/Discharge Planning: Goal: Ability to manage health-related needs will improve Outcome: Progressing   Problem: Clinical Measurements: Goal: Ability to maintain clinical measurements within normal limits will improve Outcome: Progressing Goal: Cardiovascular complication will be avoided Outcome: Progressing   Problem: Nutrition: Goal: Adequate nutrition will be maintained Outcome: Progressing   Problem: Pain Managment: Goal: General experience of comfort will improve Outcome: Progressing   Problem: Skin Integrity: Goal: Risk for impaired skin integrity will decrease Outcome: Progressing

## 2021-03-22 NOTE — Care Management Important Message (Signed)
Important Message  Patient Details  Name: [name] MRN: [id number] Date of Birth: 14-May-1931   Medicare Important Message Given:  Yes     [name] P [place] 03/22/2021, 3:41 PM

## 2021-03-23 ENCOUNTER — Encounter (HOSPITAL_COMMUNITY): Payer: Self-pay | Admitting: Orthopedic Surgery

## 2021-03-23 LAB — CBC WITH DIFFERENTIAL/PLATELET
Abs Immature Granulocytes: 0.08 10*3/uL — ABNORMAL HIGH (ref 0.00–0.07)
Basophils Absolute: 0 10*3/uL (ref 0.0–0.1)
Basophils Relative: 0 %
Eosinophils Absolute: 0 10*3/uL (ref 0.0–0.5)
Eosinophils Relative: 0 %
HCT: 24.9 % — ABNORMAL LOW (ref 39.0–52.0)
Hemoglobin: 8.4 g/dL — ABNORMAL LOW (ref 13.0–17.0)
Immature Granulocytes: 1 %
Lymphocytes Relative: 9 %
Lymphs Abs: 1 10*3/uL (ref 0.7–4.0)
MCH: 33.1 pg (ref 26.0–34.0)
MCHC: 33.7 g/dL (ref 30.0–36.0)
MCV: 98 fL (ref 80.0–100.0)
Monocytes Absolute: 1.3 10*3/uL — ABNORMAL HIGH (ref 0.1–1.0)
Monocytes Relative: 12 %
Neutro Abs: 8.6 10*3/uL — ABNORMAL HIGH (ref 1.7–7.7)
Neutrophils Relative %: 78 %
Platelets: 147 10*3/uL — ABNORMAL LOW (ref 150–400)
RBC: 2.54 MIL/uL — ABNORMAL LOW (ref 4.22–5.81)
RDW: 16 % — ABNORMAL HIGH (ref 11.5–15.5)
WBC: 10.9 10*3/uL — ABNORMAL HIGH (ref 4.0–10.5)
nRBC: 0.5 % — ABNORMAL HIGH (ref 0.0–0.2)

## 2021-03-23 LAB — RESP PANEL BY RT-PCR (FLU A&B, COVID) ARPGX2
Influenza A by PCR: NEGATIVE
Influenza B by PCR: NEGATIVE
SARS Coronavirus 2 by RT PCR: NEGATIVE

## 2021-03-23 LAB — GLUCOSE, CAPILLARY
Glucose-Capillary: 117 mg/dL — ABNORMAL HIGH (ref 70–99)
Glucose-Capillary: 138 mg/dL — ABNORMAL HIGH (ref 70–99)
Glucose-Capillary: 150 mg/dL — ABNORMAL HIGH (ref 70–99)
Glucose-Capillary: 89 mg/dL (ref 70–99)

## 2021-03-23 MED ORDER — METHOCARBAMOL 500 MG PO TABS: 500.0000 mg | ORAL_TABLET | Freq: Four times a day (QID) | ORAL | Status: AC | PRN

## 2021-03-23 MED ORDER — TAMSULOSIN HCL 0.4 MG PO CAPS: 0.4000 mg | ORAL_CAPSULE | Freq: Every day | ORAL | Status: AC

## 2021-03-23 MED ORDER — CARVEDILOL 3.125 MG PO TABS: 3.1250 mg | ORAL_TABLET | Freq: Two times a day (BID) | ORAL | Status: AC

## 2021-03-23 MED ORDER — TAMSULOSIN HCL 0.4 MG PO CAPS
0.4000 mg | ORAL_CAPSULE | Freq: Every day | ORAL | Status: DC
  Administered 2021-03-23: 0.4 mg via ORAL
  Filled 2021-03-23: qty 1

## 2021-03-23 MED ORDER — RISPERIDONE 0.5 MG PO TABS: 0.5000 mg | ORAL_TABLET | Freq: Every evening | ORAL | Status: AC | PRN

## 2021-03-23 MED ORDER — DOCUSATE SODIUM 100 MG PO CAPS: 100.0000 mg | ORAL_CAPSULE | Freq: Two times a day (BID) | ORAL | 0 refills | Status: AC

## 2021-03-23 MED ORDER — ASPIRIN 325 MG PO TBEC: 325.0000 mg | DELAYED_RELEASE_TABLET | Freq: Every day | ORAL | 0 refills | Status: DC

## 2021-03-23 NOTE — Plan of Care (Signed)
  Problem: Clinical Measurements: Goal: Will remain free from infection Outcome: Progressing Goal: Diagnostic test results will improve Outcome: Progressing   

## 2021-03-23 NOTE — Progress Notes (Signed)
Occupational Therapy Treatment Patient Details Name: [name] MRN: [id number] DOB: Nov 07, 1930 Today's Date: 03/23/2021    History of present illness Patient is a 87 year old male who presented from [facility] memory care to the emergency department with complaint of fall followed by pain in the right hip.  He was ambulating in his room with a walker when he fell and developed severe right hip pain, unable to walk; s/p IM Nail on 7/2, WBAT; with history of dementia, hypertension, sick sinus syndrome with pacer, myasthenia gravis, coronary artery disease   OT comments  Pt. Required max cues to follow directions. Pt. Worked on bed mobility, sit to stand with walker. Attempted to transfer pt. With walker and 2 person assist. Pt. Was not able to move feet. Pt. Was sat back down on bed and steady was used for transfer. Pt. Sat in chair for ADL training. Pt. To be seen for acute ot while in the hospital.   Follow Up Recommendations  SNF;Supervision/Assistance - 24 hour    Equipment Recommendations  None recommended by OT    Recommendations for Other Services      Precautions / Restrictions Precautions Precautions: Fall Restrictions RLE Weight Bearing: Weight bearing as tolerated       Mobility Bed Mobility Overal bed mobility: Needs Assistance Bed Mobility: Supine to Sit     Supine to sit: Max assist     General bed mobility comments: cues for technique.    Transfers Overall transfer level: Needs assistance Equipment used: Rolling walker (2 wheeled) (steady transfer)   Sit to Stand: Max assist;+2 physical assistance;+2 safety/equipment Stand pivot transfers:  (Pt. unable to move feet and had to be sat back down on bed.)       General transfer comment: Pt. would not move feet with transfer with max cues.    Balance Overall balance assessment: Needs assistance   Sitting balance-Leahy Scale: Fair       Standing balance-Leahy Scale: Poor                              ADL either performed or assessed with clinical judgement   ADL Overall ADL's : Needs assistance/impaired Eating/Feeding: Set up;Sitting   Grooming: Wash/dry hands;Wash/dry face;Oral care;Brushing hair;Applying deodorant;Minimal assistance;Sitting   Upper Body Bathing: Moderate assistance;Sitting   Lower Body Bathing: Maximal assistance;+2 for physical assistance;+2 for safety/equipment;Sit to/from stand   Upper Body Dressing : Minimal assistance;Sitting   Lower Body Dressing: Total assistance;+2 for physical assistance;+2 for safety/equipment   Toilet Transfer: Total assistance;+2 for physical assistance;+2 for safety/equipment (attempted to stand pivot and then had to use steady)   Toileting- Clothing Manipulation and Hygiene: Total assistance;+2 for physical assistance;+2 for safety/equipment       Functional mobility during ADLs: Maximal assistance;+2 for physical assistance;+2 for safety/equipment (sit to stand. steady for transfer.) General ADL Comments: Pt. is max a with ADLs. Pt. was ed on AE for LE dressing but was not able to consistanly follow directions.     Vision   Vision Assessment?: No apparent visual deficits   Perception     Praxis      Cognition Arousal/Alertness: Awake/alert Behavior During Therapy: WFL for tasks assessed/performed Overall Cognitive Status: History of cognitive impairments - at baseline  Exercises     Shoulder Instructions       General Comments      Pertinent Vitals/ Pain       Pain Assessment: 0-10 Pain Score: 2  Pain Location: r hip Pain Descriptors / Indicators: Grimacing;Guarding Pain Intervention(s): Premedicated before session  Home Living                                          Prior Functioning/Environment              Frequency  Min 2X/week        Progress Toward Goals  OT Goals(current goals can now be  found in the care plan section)  Progress towards OT goals: Progressing toward goals  Acute Rehab OT Goals Patient Stated Goal: to get better OT Goal Formulation: With patient Time For Goal Achievement: 04/04/21 Potential to Achieve Goals: Fair ADL Goals Pt Will Perform Lower Body Bathing: with min assist;sit to/from stand Pt Will Perform Lower Body Dressing: with min assist;sit to/from stand Pt Will Transfer to Toilet: with min guard assist;ambulating;bedside commode Pt Will Perform Toileting - Clothing Manipulation and hygiene: with supervision;sit to/from stand  Plan Discharge plan remains appropriate    Co-evaluation                 AM-PAC OT "6 Clicks" Daily Activity     Outcome Measure   Help from another person eating meals?: A Little Help from another person taking care of personal grooming?: A Little Help from another person toileting, which includes using toliet, bedpan, or urinal?: Total Help from another person bathing (including washing, rinsing, drying)?: Total Help from another person to put on and taking off regular upper body clothing?: A Lot Help from another person to put on and taking off regular lower body clothing?: Total 6 Click Score: 11    End of Session Equipment Utilized During Treatment: Gait belt;Rolling walker  OT Visit Diagnosis: Unsteadiness on feet (R26.81);Other abnormalities of gait and mobility (R26.89);Repeated falls (R29.6);Muscle weakness (generalized) (M62.81);History of falling (Z91.81);Pain;Other symptoms and signs involving cognitive function   Activity Tolerance Patient tolerated treatment well;Patient limited by pain   Patient Left in chair;with call bell/phone within reach;with chair alarm set;with family/visitor present   Nurse Communication  (ok therapy)        Time: 0349-1791 OT Time Calculation (min): 53 min  Charges: OT General Charges $OT Visit: 1 Visit OT Treatments $Self Care/Home Management : 23-37  mins $Therapeutic Activity: 23-37 mins  Reece Packer OT/L    Kelson Queenan 03/23/2021, 10:58 AM

## 2021-03-23 NOTE — TOC Transition Note (Signed)
Transition of Care [facility]) - CM/SW Discharge Note   Patient Details  Name: [name] MRN: [id number] Date of Birth: 05/13/1931  Transition of [facility]) CM/SW Contact:  [name], [place] Phone Number: 03/23/2021, 1:25 PM   Clinical Narrative:     Patient will DC to: [facility] Anticipated DC date: 03/23/21 Family notified: [name] Transport by: [name]   Per MD patient ready for DC to [facility]. RN, patient, patient's family, and facility notified of DC. Discharge Summary and FL2 sent to facility. RN to call report prior to discharge [phone] Room 35). DC packet on chart. Ambulance transport requested for patient.   CSW will sign off for now as social work intervention is no longer needed. Please consult [place] again if new needs arise.    Final next level of care: [place] Barriers to Discharge: No Barriers Identified   Patient Goals and CMS Choice     Choice offered to / list presented to : Adult Children  Discharge Placement              Patient chooses bed at: [facility] Patient to be transferred to facility by: [place] Name of family member notified: [name] Patient and family notified of of transfer: 03/23/21  Discharge Plan and Services     Post Acute Care Choice: [place]                               Social Determinants of Health (SDOH) Interventions     Readmission Risk Interventions No flowsheet data found.

## 2021-03-23 NOTE — Progress Notes (Signed)
2101 RN given report to [facility]).   2130 Pt transported to SNF by [facility]. Pt denies any pain. Pt vital signs stable. Pt belongings transported with him.

## 2021-03-23 NOTE — Discharge Summary (Signed)
Physician Discharge Summary  [name]:[id number] DOB: 1931/08/02 DOA: 03/18/2021  PCP: Shirline Frees, MD  Admit date: 03/18/2021 Discharge date: 03/23/2021  Admitted From: Home Disposition:  SNF  Discharge Condition:Stable CODE STATUS:FULL Diet recommendation: Heart Healthy   Brief/Interim Summary:  Patient is a 87 year old male with history of dementia, hypertension, sick sinus syndrome with pacer, myasthenia gravis, coronary artery disease who presented from [facility] memory care to the emergency department with complaint of fall followed by pain in the right hip.  He was ambulating in his room with a walker when he fell and developed severe right hip pain, unable to walk.   Lab work showed AKI with creatinine of 1.6, leukocytosis.  CT head was negative for acute abnormalities, CT cervical spine did not show any fractures.  X-ray of the hip showed acute comminuted and displaced right intertrochanteric femur fracture.  Orthopedics consulted and he underwent ORIF/intramedullary nailing on 03/19/2021.  PT/OT recommended skilled nursing facility on discharge.  Medically stable for discharge today.  Following problems were addressed during his hospitalization:   Right hip fracture: [place] while ambulating with walker in his room at memory care.  X-ray showedacute comminuted and displaced right intertrochanteric femur fracture.  Orthopedics consulted and  he underwent ORIF/intramedullary nailing on 03/19/2021. Continue pain management, supportive care. On aspirin for dvt ppx.  PT/OT consultation done,recommended SNF on DC.   Follow-up with orthopedics in 2 weeks.  Acute urinary retention/AKI: Creatinine was 1.09 in December 21.  Found to be acutely retaining urine in the emergency department.  Foley was removed but he again retained and had to be placed back.  We recommend to follow-up with [facility] in a week ,added tamsulosin .   History of hypertension: Takes carvedilol,amlodipine  and Aldactone at home.  Continue carvedilol at reduced dose   History of myasthenia gravis: On chronic steroid use.  On prednisone, Mestinon at home.  Continue   History of dementia/hospital-acquired delirium: Patient noted to be agitated during this hospitalization.  Mental status is stable today and he is calmer.Continue risperidone 0.5 mg at night  as needed for agitation.   Coronary artery disease: No anginal symptoms.  Takes aspirin at home.  Continue carvedilol  Hyperglycemia:  Recent hemoglobin A1c of 6.1% as per August 2021.Blood sugar stable   Laceration of left leg: Wound care consulted   Leukocytosis: Likely from steroid therapy.  Continue to monitor as an outpatient   Normocytic anemia/thrombocytopenia: Hemoglobin dropped from 14 to 8 today most likely secondary to hemodilution from IV fluids and surgery.Check CBC in a week         Discharge Diagnoses:  Principal Problem:   Closed displaced intertrochanteric fracture of right femur ([place]) Active Problems:   Essential hypertension   Coronary artery disease involving native coronary artery of native heart without angina pectoris   SSS (sick sinus syndrome) (HCC)   Myasthenia gravis ([place])   Late onset Alzheimer's disease with behavioral disturbance (HCC)   Renal insufficiency   Hyponatremia   Hyperglycemia    Discharge Instructions  Discharge Instructions     Diet - low sodium heart healthy   Complete by: As directed    Discharge instructions   Complete by: As directed    1) Please take prescribed medications as instructed 2)Follow up with orthopedics as an outpatient in 2 weeks.  Name and number of the provider has been attached 3) Do a CBC test in a week to check your Hb 4)You are being discharged with a  Foley catheter.  Please follow-up with [facility] as an outpatient in a week in order to consider for removal.   Discharge wound care:   Complete by: As directed    As above   Increase activity  slowly   Complete by: As directed       Allergies as of 03/23/2021       Reactions   Cefuroxime Axetil Other (See Comments)   Reaction not recalled   Cyclosporine Other (See Comments)   Reaction not recalled   Elemental Sulfur Other (See Comments)   No energy, could not walk    Maxzide [triamterene-hctz] Other (See Comments)   Adverse reaction- bloating and abdominal pain   Sulfa Antibiotics Other (See Comments)   Could not sleep/eat and experienced gastric pain   Sulfamethoxazole Other (See Comments)   Could not sleep/eat and had gastric pain   Sulfamethoxazole-trimethoprim Other (See Comments)   Gastric pain and could not eat/sleep   Prednisone Other (See Comments)   Can tolerate for a short period of time        Medication List     STOP taking these medications    aspirin 81 MG chewable tablet Replaced by: aspirin 325 MG EC tablet   QUEtiapine 25 MG tablet Commonly known as: SEROQUEL       TAKE these medications    amLODipine 2.5 MG tablet Commonly known as: NORVASC Take 2.5 mg by mouth every morning. What changed: Another medication with the same name was removed. Continue taking this medication, and follow the directions you see here.   aspirin 325 MG EC tablet Take 1 tablet (325 mg total) by mouth daily with breakfast. Replaces: aspirin 81 MG chewable tablet   carvedilol 3.125 MG tablet Commonly known as: COREG Take 1 tablet (3.125 mg total) by mouth 2 (two) times daily. What changed:  medication strength how much to take   Dextromethorphan-guaiFENesin 10-100 MG/5ML liquid Take 10 mLs by mouth every 4 (four) hours as needed (cough/congestion).   docusate sodium 100 MG capsule Commonly known as: COLACE Take 1 capsule (100 mg total) by mouth 2 (two) times daily.   Ensure Take 237 mLs by mouth daily as needed (meal refusal).   NUTRITIONAL SUPPLEMENT PO Take 1 Can by mouth 2 (two) times daily. Muscle Milk (provided by family)   finasteride 5 MG  tablet Commonly known as: PROSCAR Take 5 mg by mouth every morning.   LORazepam 0.5 MG tablet Commonly known as: ATIVAN Take 1 tablet (0.5 mg total) by mouth at bedtime as needed (insomnia). What changed: Another medication with the same name was removed. Continue taking this medication, and follow the directions you see here.   methocarbamol 500 MG tablet Commonly known as: ROBAXIN Take 1 tablet (500 mg total) by mouth every 6 (six) hours as needed for muscle spasms.   ondansetron 4 MG tablet Commonly known as: ZOFRAN Take 4 mg by mouth every 8 (eight) hours as needed for nausea or vomiting.   oxyCODONE 5 MG immediate release tablet Commonly known as: Oxy IR/ROXICODONE Take 1 tablet (5 mg total) by mouth every 6 (six) hours as needed for severe pain.   predniSONE 20 MG tablet Commonly known as: DELTASONE Take 1 tablet (20 mg total) by mouth daily with breakfast.   risperiDONE 0.5 MG tablet Commonly known as: RISPERDAL Take 1 tablet (0.5 mg total) by mouth at bedtime as needed (agitation).   tamsulosin 0.4 MG Caps capsule Commonly known as: FLOMAX Take 1 capsule (0.4  mg total) by mouth daily.   Triple Antibiotic Oint Apply 1 application topically See admin instructions. Apply small amount of triple antibiotic ointment to non-stick pad and cover wound, wrap with coban until healed       ASK your doctor about these medications    Nitrostat 0.4 MG SL tablet Generic drug: nitroGLYCERIN DISSOLVE ONE TABLET UNDER THE TONGUE EVERY 5 MINUTES AS NEEDED FOR CHEST PAIN.  DO NOT EXCEED A TOTAL OF 3 DOSES IN 15 MINUTES   pyridostigmine 60 MG tablet Commonly known as: MESTINON Take 1 tablet at 7am, 10am 1pm, and 5pm   spironolactone 25 MG tablet Commonly known as: ALDACTONE Take 0.5 tablets (12.5 mg total) by mouth daily.               Discharge Care Instructions  (From admission, onward)           Start     Ordered   03/23/21 0000  Discharge wound care:        Comments: As above   03/23/21 1032            Follow-up Information     [facility]. Schedule an appointment as soon as possible for a visit in 1 week(s).   Contact information: [place]        Nicholes Stairs, MD. Schedule an appointment as soon as possible for a visit in 2 week(s).   Specialty: Orthopedic Surgery Contact information: [address] [place] [address] [place] [address] [phone]                Allergies  Allergen Reactions   Cefuroxime Axetil Other (See Comments)    Reaction not recalled   Cyclosporine Other (See Comments)    Reaction not recalled   Elemental Sulfur Other (See Comments)    No energy, could not walk    Maxzide [Triamterene-Hctz] Other (See Comments)    Adverse reaction- bloating and abdominal pain   Sulfa Antibiotics Other (See Comments)    Could not sleep/eat and experienced gastric pain   Sulfamethoxazole Other (See Comments)    Could not sleep/eat and had gastric pain   Sulfamethoxazole-Trimethoprim Other (See Comments)    Gastric pain and could not eat/sleep   Prednisone Other (See Comments)    Can tolerate for a short period of time    Consultations: Orthopedics   Procedures/Studies: DG Chest 1 View  Result Date: 03/18/2021 CLINICAL DATA:  Fall with deformity EXAM: CHEST  1 VIEW COMPARISON:  08/26/2020, 05/12/2020, 04/09/2020 FINDINGS: Apical pleural thickening. Left-sided pacing device as before. Bilateral peripheral and lower lung reticular opacity consistent with interstitial fibrosis and chronic lung disease. No definite acute superimposed airspace disease, pleural effusion, or pneumothorax. Stable cardiomediastinal silhouette with aortic atherosclerosis. IMPRESSION: Chronic interstitial lung disease without definite acute interval change since 08/26/2020. Electronically Signed   By: Donavan Foil M.D.   On: 03/18/2021 21:58   DG  Pelvis 1-2 Views  Result Date: 03/18/2021 CLINICAL DATA:  Fall with right femur deformity EXAM: PELVIS - 1-2 VIEW COMPARISON:  None. FINDINGS: Pubic symphysis and rami appear intact. Both femoral heads project in joint. SI joints are non widened. Acute comminuted right intertrochanteric fracture with displaced lesser trochanteric fracture fragments. IMPRESSION: Acute comminuted and displaced right intertrochanteric fracture Electronically Signed   By: Donavan Foil M.D.   On: 03/18/2021 22:00   DG Tibia/Fibula Right  Result Date: 03/18/2021 CLINICAL DATA:  Fall with  right femur deformity EXAM: RIGHT TIBIA AND FIBULA - 2 VIEW COMPARISON:  None. FINDINGS: There is no evidence of fracture or other focal bone lesions. Soft tissues are unremarkable. IMPRESSION: Negative. Electronically Signed   By: Donavan Foil M.D.   On: 03/18/2021 21:59   CT Head Wo Contrast  Result Date: 03/18/2021 CLINICAL DATA:  Fall EXAM: CT HEAD WITHOUT CONTRAST CT CERVICAL SPINE WITHOUT CONTRAST TECHNIQUE: Multidetector CT imaging of the head and cervical spine was performed following the standard protocol without intravenous contrast. Multiplanar CT image reconstructions of the cervical spine were also generated. COMPARISON:  CT head 08/26/2020, cervical spine radiograph 05/13/2020, CT cervical spine 03/03/2007 FINDINGS: CT HEAD FINDINGS Brain: Mild streak artifact across the posterior fossa from dental amalgam. No evidence of acute infarction, hemorrhage, hydrocephalus, extra-axial collection, visible mass lesion or mass effect. Symmetric prominence of the ventricles, cisterns and sulci compatible with parenchymal volume loss. Patchy areas of white matter hypoattenuation are most compatible with chronic microvascular angiopathy. Vascular: Atherosclerotic calcification of the carotid siphons and intradural vertebral arteries. No hyperdense vessel. Skull: No significant scalp swelling or hematoma. No calvarial fracture or focal osseous  abnormality. Sinuses/Orbits: Paranasal sinuses and mastoid air cells are predominantly clear. Middle ear cavities are clear. Debris in the right external auditory canal. Orbital structures are unremarkable aside from prior lens extractions. Other: Bilateral TMJ arthrosis. CT CERVICAL SPINE FINDINGS Alignment: Cervical stabilization collar in place at the time exam. Preservation of the normal cervical lordosis. Levocurvature of the cervical spine with dextrocurvature of the imaged thoracic levels. Some of which may be positional. No evidence of traumatic listhesis. No abnormally widened, perched or jumped facets. Normal alignment of the craniocervical and atlantoaxial articulations accounting for mild rightward cranial rotation. Skull base and vertebrae: No acute skull base fracture. No vertebral body fracture or height loss. Normal bone mineralization. No worrisome osseous lesions. Arthrosis about the atlantodental and basion dens intervals. Additional multilevel cervical spondylitic changes, further detailed below. Soft tissues and spinal canal: No pre or paravertebral fluid or swelling. No visible canal hematoma. Airways patent. No concerning adenopathy. Calcifications proximal great vessels and aortic arch as well as the cervical carotids. Partial visualization a left chest wall pacer pack and subclavian leads. Disc levels: Multilevel intervertebral disc height loss with spondylitic endplate changes. Some multilevel disc osteophyte complexes partially efface the ventral thecal sac with more pronounced findings at the C3-4, C4-5 level where this results in at least mild canal stenosis. Multilevel uncinate spurring facet hypertrophic changes result in mild multilevel neural foraminal narrowing with more moderate narrowing at C3-4 as well. Upper chest: Progressive biapical pleuroparenchymal scarring and reticular interstitial changes, incompletely assessed on this exam. Consolidative process or convincing edema. No  acute traumatic findings in the upper chest. Other: Normal thyroid. IMPRESSION: 1. No acute intracranial abnormality. No significant scalp swelling or hematoma. No calvarial fracture. 2. Background of microvascular angiopathy and diffuse parenchymal loss. 3. No acute fracture or traumatic listhesis of the cervical spine. 4. Possible cervical spondylitic changes; most pronounced about the atlantodental and basion dens interval as well as at the C3-4 level there is mild canal stenosis and moderate bilateral foraminal narrowing. 5. Progressive scarring and interstitial reticular changes in lung apices. Incompletely assessed on this exam. Could consider outpatient HRCT for further interrogation as warranted. 6. Intracranial, cervical carotid and aortic atherosclerosis. Electronically Signed   By: Lovena Le M.D.   On: 03/18/2021 21:47   CT Cervical Spine Wo Contrast  Result Date: 03/18/2021 CLINICAL DATA:  Fall  EXAM: CT HEAD WITHOUT CONTRAST CT CERVICAL SPINE WITHOUT CONTRAST TECHNIQUE: Multidetector CT imaging of the head and cervical spine was performed following the standard protocol without intravenous contrast. Multiplanar CT image reconstructions of the cervical spine were also generated. COMPARISON:  CT head 08/26/2020, cervical spine radiograph 05/13/2020, CT cervical spine 03/03/2007 FINDINGS: CT HEAD FINDINGS Brain: Mild streak artifact across the posterior fossa from dental amalgam. No evidence of acute infarction, hemorrhage, hydrocephalus, extra-axial collection, visible mass lesion or mass effect. Symmetric prominence of the ventricles, cisterns and sulci compatible with parenchymal volume loss. Patchy areas of white matter hypoattenuation are most compatible with chronic microvascular angiopathy. Vascular: Atherosclerotic calcification of the carotid siphons and intradural vertebral arteries. No hyperdense vessel. Skull: No significant scalp swelling or hematoma. No calvarial fracture or focal  osseous abnormality. Sinuses/Orbits: Paranasal sinuses and mastoid air cells are predominantly clear. Middle ear cavities are clear. Debris in the right external auditory canal. Orbital structures are unremarkable aside from prior lens extractions. Other: Bilateral TMJ arthrosis. CT CERVICAL SPINE FINDINGS Alignment: Cervical stabilization collar in place at the time exam. Preservation of the normal cervical lordosis. Levocurvature of the cervical spine with dextrocurvature of the imaged thoracic levels. Some of which may be positional. No evidence of traumatic listhesis. No abnormally widened, perched or jumped facets. Normal alignment of the craniocervical and atlantoaxial articulations accounting for mild rightward cranial rotation. Skull base and vertebrae: No acute skull base fracture. No vertebral body fracture or height loss. Normal bone mineralization. No worrisome osseous lesions. Arthrosis about the atlantodental and basion dens intervals. Additional multilevel cervical spondylitic changes, further detailed below. Soft tissues and spinal canal: No pre or paravertebral fluid or swelling. No visible canal hematoma. Airways patent. No concerning adenopathy. Calcifications proximal great vessels and aortic arch as well as the cervical carotids. Partial visualization a left chest wall pacer pack and subclavian leads. Disc levels: Multilevel intervertebral disc height loss with spondylitic endplate changes. Some multilevel disc osteophyte complexes partially efface the ventral thecal sac with more pronounced findings at the C3-4, C4-5 level where this results in at least mild canal stenosis. Multilevel uncinate spurring facet hypertrophic changes result in mild multilevel neural foraminal narrowing with more moderate narrowing at C3-4 as well. Upper chest: Progressive biapical pleuroparenchymal scarring and reticular interstitial changes, incompletely assessed on this exam. Consolidative process or convincing  edema. No acute traumatic findings in the upper chest. Other: Normal thyroid. IMPRESSION: 1. No acute intracranial abnormality. No significant scalp swelling or hematoma. No calvarial fracture. 2. Background of microvascular angiopathy and diffuse parenchymal loss. 3. No acute fracture or traumatic listhesis of the cervical spine. 4. Possible cervical spondylitic changes; most pronounced about the atlantodental and basion dens interval as well as at the C3-4 level there is mild canal stenosis and moderate bilateral foraminal narrowing. 5. Progressive scarring and interstitial reticular changes in lung apices. Incompletely assessed on this exam. Could consider outpatient HRCT for further interrogation as warranted. 6. Intracranial, cervical carotid and aortic atherosclerosis. Electronically Signed   By: Lovena Le M.D.   On: 03/18/2021 21:47   DG C-Arm 1-60 Min  Result Date: 03/19/2021 CLINICAL DATA:  RIGHT IM nail. EXAM: RIGHT FEMUR 2 VIEWS; DG C-ARM 1-60 MIN COMPARISON:  03/18/2021 FINDINGS: Five images demonstrate proximal and distal aspects of intramedullary nail and lag screw across intertrochanteric fracture of the RIGHT hip no interval fractures. Hip is located. IMPRESSION: IM nail of the RIGHT femur. Electronically Signed   By: Nolon Nations M.D.   On: 03/19/2021  12:13   DG FEMUR, MIN 2 VIEWS RIGHT  Result Date: 03/19/2021 CLINICAL DATA:  RIGHT IM nail. EXAM: RIGHT FEMUR 2 VIEWS; DG C-ARM 1-60 MIN COMPARISON:  03/18/2021 FINDINGS: Five images demonstrate proximal and distal aspects of intramedullary nail and lag screw across intertrochanteric fracture of the RIGHT hip no interval fractures. Hip is located. IMPRESSION: IM nail of the RIGHT femur. Electronically Signed   By: Nolon Nations M.D.   On: 03/19/2021 12:13   DG FEMUR, MIN 2 VIEWS RIGHT  Result Date: 03/18/2021 CLINICAL DATA:  Fall with femur deformity EXAM: RIGHT FEMUR 2 VIEWS COMPARISON:  None. FINDINGS: Acute comminuted and  displaced right intertrochanteric femur fracture without femoral head dislocation. Vascular calcifications. Mid to distal femur appears intact IMPRESSION: Acute comminuted and displaced right intertrochanteric fracture Electronically Signed   By: Donavan Foil M.D.   On: 03/18/2021 22:01      Subjective: Patient seen and examined the bedside this morning.  Hemodynamically stable for discharge today.  Discharge Exam: Vitals:   03/22/21 1955 03/23/21 0740  BP: (!) 145/122 127/75  Pulse: 78 99  Resp: 18 17  Temp: 98.7 F (37.1 C) 98.4 F (36.9 C)  SpO2: 98% 96%   Vitals:   03/22/21 0734 03/22/21 1638 03/22/21 1955 03/23/21 0740  BP: (!) 159/88 128/76 (!) 145/122 127/75  Pulse: (!) 107 86 78 99  Resp: 17 17 18 17   Temp: 98.4 F (36.9 C) 98 F (36.7 C) 98.7 F (37.1 C) 98.4 F (36.9 C)  TempSrc: Oral Oral Oral Oral  SpO2: 96% 94% 98% 96%  Weight:      Height:        General: Pt is alert, awake, not in acute distress Cardiovascular: RRR, S1/S2 +, no rubs, no gallops Respiratory: CTA bilaterally, no wheezing, no rhonchi Abdominal: Soft, NT, ND, bowel sounds + Extremities: no edema, no cyanosis, clean surgical wound on the right hip GU: Foley    The results of significant diagnostics from this hospitalization (including imaging, microbiology, ancillary and laboratory) are listed below for reference.     Microbiology: Recent Results (from the past 240 hour(s))  Resp Panel by RT-PCR (Flu A&B, Covid) Nasopharyngeal Swab     Status: None   Collection Time: 03/18/21 10:01 PM   Specimen: Nasopharyngeal Swab; Nasopharyngeal(NP) swabs in vial transport medium  Result Value Ref Range Status   SARS Coronavirus 2 by RT PCR NEGATIVE NEGATIVE Final    Comment: (NOTE) SARS-CoV-2 target nucleic acids are NOT DETECTED.  The SARS-CoV-2 RNA is generally detectable in upper respiratory specimens during the acute phase of infection. The lowest concentration of SARS-CoV-2 viral copies  this assay can detect is 138 copies/mL. A negative result does not preclude SARS-Cov-2 infection and should not be used as the sole basis for treatment or other patient management decisions. A negative result may occur with  improper specimen collection/handling, submission of specimen other than nasopharyngeal swab, presence of viral mutation(s) within the areas targeted by this assay, and inadequate number of viral copies(<138 copies/mL). A negative result must be combined with clinical observations, patient history, and epidemiological information. The expected result is Negative.  Fact Sheet for Patients:  EntrepreneurPulse.com.au  Fact Sheet for Healthcare Providers:  IncredibleEmployment.be  This test is no t yet approved or cleared by the [place] FDA and  has been authorized for detection and/or diagnosis of SARS-CoV-2 by FDA under an Emergency Use Authorization (EUA). This EUA will remain  in effect (meaning this test can be used)  for the duration of the COVID-19 declaration under Section 564(b)(1) of the Act, 21 U.S.C.section 360bbb-3(b)(1), unless the authorization is terminated  or revoked sooner.       Influenza A by PCR NEGATIVE NEGATIVE Final   Influenza B by PCR NEGATIVE NEGATIVE Final    Comment: (NOTE) The Xpert Xpress SARS-CoV-2/FLU/RSV plus assay is intended as an aid in the diagnosis of influenza from Nasopharyngeal swab specimens and should not be used as a sole basis for treatment. Nasal washings and aspirates are unacceptable for Xpert Xpress SARS-CoV-2/FLU/RSV testing.  Fact Sheet for Patients: EntrepreneurPulse.com.au  Fact Sheet for Healthcare Providers: IncredibleEmployment.be  This test is not yet approved or cleared by the [place] FDA and has been authorized for detection and/or diagnosis of SARS-CoV-2 by FDA under an Emergency Use Authorization (EUA). This EUA  will remain in effect (meaning this test can be used) for the duration of the COVID-19 declaration under Section 564(b)(1) of the Act, 21 U.S.C. section 360bbb-3(b)(1), unless the authorization is terminated or revoked.  Performed at [facility], [place] [address], [place], [place] [address]   Urine culture     Status: None   Collection Time: 03/19/21 12:05 AM   Specimen: Urine, Random  Result Value Ref Range Status   Specimen Description URINE, RANDOM  Final   Special Requests NONE  Final   Culture   Final    NO GROWTH Performed at [facility], [place] [address] [place], [place] [address]    Report Status 03/20/2021 FINAL  Final     Labs: BNP (last 3 results) No results for input(s): BNP in the last 8760 hours. Basic Metabolic Panel: Recent Labs  Lab 03/18/21 2159 03/19/21 0635 03/20/21 0137 03/21/21 0105 03/22/21 0335  NA 130* 132* 130* 129* 132*  K 5.1 5.4* 5.3* 4.6 4.5  CL 100 102 100 100 101  CO2 21* 22 22 24 26   GLUCOSE 170* 171* 129* 107* 107*  BUN 42* 47* 38* 29* 21  CREATININE 1.61* 1.97* 1.55* 1.18 1.07  CALCIUM 9.1 8.7* 8.5* 8.1* 8.4*   Liver Function Tests: No results for input(s): AST, ALT, ALKPHOS, BILITOT, PROT, ALBUMIN in the last 168 hours. No results for input(s): LIPASE, AMYLASE in the last 168 hours. No results for input(s): AMMONIA in the last 168 hours. CBC: Recent Labs  Lab 03/18/21 2159 03/19/21 0635 03/20/21 0137 03/21/21 0105 03/22/21 0335 03/23/21 0352  WBC 21.1* 20.9* 16.9* 15.3* 10.8* 10.9*  NEUTROABS 18.0*  --   --   --   --  8.6*  HGB 14.1 12.9* 9.3* 8.1* 7.9* 8.4*  HCT 42.2 39.5 27.4* 24.3* 23.9* 24.9*  MCV 97.7 99.7 97.5 98.0 99.2 98.0  PLT 197 187 135* 122* 114* 147*   Cardiac Enzymes: No results for input(s): CKTOTAL, CKMB, CKMBINDEX, TROPONINI in the last 168 hours. BNP: Invalid input(s): POCBNP CBG: Recent Labs  Lab 03/22/21 1121 03/22/21 1639 03/22/21 2013 03/23/21 0029 03/23/21 0620  GLUCAP 143*  142* 169* 117* 89   D-Dimer No results for input(s): DDIMER in the last 72 hours. Hgb A1c No results for input(s): HGBA1C in the last 72 hours. Lipid Profile No results for input(s): CHOL, HDL, LDLCALC, TRIG, CHOLHDL, LDLDIRECT in the last 72 hours. Thyroid function studies No results for input(s): TSH, T4TOTAL, T3FREE, THYROIDAB in the last 72 hours.  Invalid input(s): FREET3 Anemia work up No results for input(s): VITAMINB12, FOLATE, FERRITIN, TIBC, IRON, RETICCTPCT in the last 72 hours. Urinalysis  Component Value Date/Time   COLORURINE YELLOW 03/18/2021 2255   APPEARANCEUR CLEAR 03/18/2021 2255   APPEARANCEUR Cloudy (A) 08/03/2020 1420   LABSPEC 1.021 03/18/2021 2255   PHURINE 5.0 03/18/2021 2255   GLUCOSEU 50 (A) 03/18/2021 2255   GLUCOSEU NEGATIVE 08/14/2011 1058   HGBUR NEGATIVE 03/18/2021 2255   BILIRUBINUR NEGATIVE 03/18/2021 2255   BILIRUBINUR Negative 08/03/2020 1420   KETONESUR NEGATIVE 03/18/2021 2255   PROTEINUR NEGATIVE 03/18/2021 2255   UROBILINOGEN 0.2 08/14/2011 1058   NITRITE NEGATIVE 03/18/2021 2255   LEUKOCYTESUR NEGATIVE 03/18/2021 2255   Sepsis Labs Invalid input(s): PROCALCITONIN,  WBC,  LACTICIDVEN Microbiology Recent Results (from the past 240 hour(s))  Resp Panel by RT-PCR (Flu A&B, Covid) Nasopharyngeal Swab     Status: None   Collection Time: 03/18/21 10:01 PM   Specimen: Nasopharyngeal Swab; Nasopharyngeal(NP) swabs in vial transport medium  Result Value Ref Range Status   SARS Coronavirus 2 by RT PCR NEGATIVE NEGATIVE Final    Comment: (NOTE) SARS-CoV-2 target nucleic acids are NOT DETECTED.  The SARS-CoV-2 RNA is generally detectable in upper respiratory specimens during the acute phase of infection. The lowest concentration of SARS-CoV-2 viral copies this assay can detect is 138 copies/mL. A negative result does not preclude SARS-Cov-2 infection and should not be used as the sole basis for treatment or other patient management  decisions. A negative result may occur with  improper specimen collection/handling, submission of specimen other than nasopharyngeal swab, presence of viral mutation(s) within the areas targeted by this assay, and inadequate number of viral copies(<138 copies/mL). A negative result must be combined with clinical observations, patient history, and epidemiological information. The expected result is Negative.  Fact Sheet for Patients:  EntrepreneurPulse.com.au  Fact Sheet for Healthcare Providers:  IncredibleEmployment.be  This test is no t yet approved or cleared by the [place] FDA and  has been authorized for detection and/or diagnosis of SARS-CoV-2 by FDA under an Emergency Use Authorization (EUA). This EUA will remain  in effect (meaning this test can be used) for the duration of the COVID-19 declaration under Section 564(b)(1) of the Act, 21 U.S.C.section 360bbb-3(b)(1), unless the authorization is terminated  or revoked sooner.       Influenza A by PCR NEGATIVE NEGATIVE Final   Influenza B by PCR NEGATIVE NEGATIVE Final    Comment: (NOTE) The Xpert Xpress SARS-CoV-2/FLU/RSV plus assay is intended as an aid in the diagnosis of influenza from Nasopharyngeal swab specimens and should not be used as a sole basis for treatment. Nasal washings and aspirates are unacceptable for Xpert Xpress SARS-CoV-2/FLU/RSV testing.  Fact Sheet for Patients: EntrepreneurPulse.com.au  Fact Sheet for Healthcare Providers: IncredibleEmployment.be  This test is not yet approved or cleared by the [place] FDA and has been authorized for detection and/or diagnosis of SARS-CoV-2 by FDA under an Emergency Use Authorization (EUA). This EUA will remain in effect (meaning this test can be used) for the duration of the COVID-19 declaration under Section 564(b)(1) of the Act, 21 U.S.C. section 360bbb-3(b)(1), unless the  authorization is terminated or revoked.  Performed at [facility], [place] [address], [place], [place] [address]   Urine culture     Status: None   Collection Time: 03/19/21 12:05 AM   Specimen: Urine, Random  Result Value Ref Range Status   Specimen Description URINE, RANDOM  Final   Special Requests NONE  Final   Culture   Final    NO GROWTH Performed at [facility], [address] [place], [place] [address]    Report Status 03/20/2021 FINAL  Final    Please note: You were cared for by a hospitalist during your hospital stay. Once you are discharged, your primary care physician will handle any further medical issues. Please note that NO REFILLS for any discharge medications will be authorized once you are discharged, as it is imperative that you return to your primary care physician (or establish a relationship with a primary care physician if you do not have one) for your post hospital discharge needs so that they can reassess your need for medications and monitor your lab values.    Time coordinating discharge: 40 minutes  SIGNED:   Shelly Coss, MD  [facility] 03/23/2021, 10:41 AM Pager [phone]  If 7PM-7AM, please contact night-coverage [email or web address] Password [email or web address]

## 2021-03-23 NOTE — Discharge Planning (Signed)
Per MD order - pt will d/c to facility with Foley. Foley/peri care & assessment completed. No complications; skin intact.

## 2021-03-28 DIAGNOSIS — E871 Hypo-osmolality and hyponatremia: Secondary | ICD-10-CM | POA: Diagnosis not present

## 2021-03-28 DIAGNOSIS — R339 Retention of urine, unspecified: Secondary | ICD-10-CM | POA: Diagnosis not present

## 2021-03-28 DIAGNOSIS — K5900 Constipation, unspecified: Secondary | ICD-10-CM | POA: Diagnosis not present

## 2021-03-28 DIAGNOSIS — D72829 Elevated white blood cell count, unspecified: Secondary | ICD-10-CM | POA: Diagnosis not present

## 2021-03-28 DIAGNOSIS — N179 Acute kidney failure, unspecified: Secondary | ICD-10-CM | POA: Diagnosis not present

## 2021-03-28 DIAGNOSIS — R410 Disorientation, unspecified: Secondary | ICD-10-CM | POA: Diagnosis not present

## 2021-03-28 DIAGNOSIS — R600 Localized edema: Secondary | ICD-10-CM | POA: Diagnosis not present

## 2021-03-28 DIAGNOSIS — D649 Anemia, unspecified: Secondary | ICD-10-CM | POA: Diagnosis not present

## 2021-03-28 DIAGNOSIS — D696 Thrombocytopenia, unspecified: Secondary | ICD-10-CM | POA: Diagnosis not present

## 2021-03-28 DIAGNOSIS — I2510 Atherosclerotic heart disease of native coronary artery without angina pectoris: Secondary | ICD-10-CM | POA: Diagnosis not present

## 2021-03-28 DIAGNOSIS — F0390 Unspecified dementia without behavioral disturbance: Secondary | ICD-10-CM | POA: Diagnosis not present

## 2021-03-29 DIAGNOSIS — R601 Generalized edema: Secondary | ICD-10-CM | POA: Diagnosis not present

## 2021-03-30 ENCOUNTER — Inpatient Hospital Stay (HOSPITAL_COMMUNITY): Payer: Medicare Other

## 2021-03-30 ENCOUNTER — Encounter (HOSPITAL_COMMUNITY): Payer: Self-pay

## 2021-03-30 ENCOUNTER — Emergency Department (HOSPITAL_COMMUNITY): Payer: Medicare Other

## 2021-03-30 ENCOUNTER — Other Ambulatory Visit: Payer: Self-pay

## 2021-03-30 ENCOUNTER — Inpatient Hospital Stay (HOSPITAL_COMMUNITY)
Admission: EM | Admit: 2021-03-30 | Discharge: 2021-04-18 | DRG: 871 | Disposition: E | Payer: Medicare Other | Source: Skilled Nursing Facility | Attending: Family Medicine | Admitting: Family Medicine

## 2021-03-30 DIAGNOSIS — N179 Acute kidney failure, unspecified: Secondary | ICD-10-CM | POA: Diagnosis not present

## 2021-03-30 DIAGNOSIS — N401 Enlarged prostate with lower urinary tract symptoms: Secondary | ICD-10-CM | POA: Diagnosis present

## 2021-03-30 DIAGNOSIS — I5033 Acute on chronic diastolic (congestive) heart failure: Secondary | ICD-10-CM | POA: Diagnosis present

## 2021-03-30 DIAGNOSIS — J440 Chronic obstructive pulmonary disease with acute lower respiratory infection: Secondary | ICD-10-CM | POA: Diagnosis not present

## 2021-03-30 DIAGNOSIS — J449 Chronic obstructive pulmonary disease, unspecified: Secondary | ICD-10-CM | POA: Diagnosis not present

## 2021-03-30 DIAGNOSIS — R5381 Other malaise: Secondary | ICD-10-CM | POA: Diagnosis not present

## 2021-03-30 DIAGNOSIS — J9601 Acute respiratory failure with hypoxia: Secondary | ICD-10-CM | POA: Diagnosis not present

## 2021-03-30 DIAGNOSIS — I959 Hypotension, unspecified: Secondary | ICD-10-CM

## 2021-03-30 DIAGNOSIS — Z881 Allergy status to other antibiotic agents status: Secondary | ICD-10-CM

## 2021-03-30 DIAGNOSIS — A4159 Other Gram-negative sepsis: Principal | ICD-10-CM | POA: Diagnosis present

## 2021-03-30 DIAGNOSIS — I2609 Other pulmonary embolism with acute cor pulmonale: Secondary | ICD-10-CM | POA: Diagnosis not present

## 2021-03-30 DIAGNOSIS — Z882 Allergy status to sulfonamides status: Secondary | ICD-10-CM

## 2021-03-30 DIAGNOSIS — Y95 Nosocomial condition: Secondary | ICD-10-CM | POA: Diagnosis not present

## 2021-03-30 DIAGNOSIS — S72141D Displaced intertrochanteric fracture of right femur, subsequent encounter for closed fracture with routine healing: Secondary | ICD-10-CM | POA: Diagnosis not present

## 2021-03-30 DIAGNOSIS — E785 Hyperlipidemia, unspecified: Secondary | ICD-10-CM | POA: Diagnosis present

## 2021-03-30 DIAGNOSIS — Z7901 Long term (current) use of anticoagulants: Secondary | ICD-10-CM

## 2021-03-30 DIAGNOSIS — J90 Pleural effusion, not elsewhere classified: Secondary | ICD-10-CM | POA: Diagnosis not present

## 2021-03-30 DIAGNOSIS — Z811 Family history of alcohol abuse and dependence: Secondary | ICD-10-CM

## 2021-03-30 DIAGNOSIS — J8410 Pulmonary fibrosis, unspecified: Secondary | ICD-10-CM | POA: Diagnosis present

## 2021-03-30 DIAGNOSIS — I2510 Atherosclerotic heart disease of native coronary artery without angina pectoris: Secondary | ICD-10-CM | POA: Diagnosis present

## 2021-03-30 DIAGNOSIS — Z888 Allergy status to other drugs, medicaments and biological substances status: Secondary | ICD-10-CM

## 2021-03-30 DIAGNOSIS — I517 Cardiomegaly: Secondary | ICD-10-CM | POA: Diagnosis not present

## 2021-03-30 DIAGNOSIS — K567 Ileus, unspecified: Secondary | ICD-10-CM | POA: Diagnosis not present

## 2021-03-30 DIAGNOSIS — I2699 Other pulmonary embolism without acute cor pulmonale: Secondary | ICD-10-CM | POA: Diagnosis not present

## 2021-03-30 DIAGNOSIS — R0902 Hypoxemia: Secondary | ICD-10-CM

## 2021-03-30 DIAGNOSIS — R6520 Severe sepsis without septic shock: Secondary | ICD-10-CM | POA: Diagnosis present

## 2021-03-30 DIAGNOSIS — Z66 Do not resuscitate: Secondary | ICD-10-CM | POA: Diagnosis not present

## 2021-03-30 DIAGNOSIS — W19XXXD Unspecified fall, subsequent encounter: Secondary | ICD-10-CM | POA: Diagnosis present

## 2021-03-30 DIAGNOSIS — J181 Lobar pneumonia, unspecified organism: Secondary | ICD-10-CM | POA: Diagnosis not present

## 2021-03-30 DIAGNOSIS — R404 Transient alteration of awareness: Secondary | ICD-10-CM | POA: Diagnosis not present

## 2021-03-30 DIAGNOSIS — Z20822 Contact with and (suspected) exposure to covid-19: Secondary | ICD-10-CM | POA: Diagnosis present

## 2021-03-30 DIAGNOSIS — A419 Sepsis, unspecified organism: Secondary | ICD-10-CM | POA: Diagnosis not present

## 2021-03-30 DIAGNOSIS — I2693 Single subsegmental pulmonary embolism without acute cor pulmonale: Secondary | ICD-10-CM | POA: Diagnosis present

## 2021-03-30 DIAGNOSIS — G301 Alzheimer's disease with late onset: Secondary | ICD-10-CM | POA: Diagnosis present

## 2021-03-30 DIAGNOSIS — R069 Unspecified abnormalities of breathing: Secondary | ICD-10-CM

## 2021-03-30 DIAGNOSIS — J9600 Acute respiratory failure, unspecified whether with hypoxia or hypercapnia: Secondary | ICD-10-CM | POA: Diagnosis not present

## 2021-03-30 DIAGNOSIS — L03114 Cellulitis of left upper limb: Principal | ICD-10-CM | POA: Insufficient documentation

## 2021-03-30 DIAGNOSIS — F0281 Dementia in other diseases classified elsewhere with behavioral disturbance: Secondary | ICD-10-CM | POA: Diagnosis present

## 2021-03-30 DIAGNOSIS — Z79899 Other long term (current) drug therapy: Secondary | ICD-10-CM

## 2021-03-30 DIAGNOSIS — R109 Unspecified abdominal pain: Secondary | ICD-10-CM

## 2021-03-30 DIAGNOSIS — J439 Emphysema, unspecified: Secondary | ICD-10-CM | POA: Diagnosis not present

## 2021-03-30 DIAGNOSIS — D649 Anemia, unspecified: Secondary | ICD-10-CM | POA: Diagnosis present

## 2021-03-30 DIAGNOSIS — I824Y1 Acute embolism and thrombosis of unspecified deep veins of right proximal lower extremity: Secondary | ICD-10-CM | POA: Diagnosis present

## 2021-03-30 DIAGNOSIS — R52 Pain, unspecified: Secondary | ICD-10-CM

## 2021-03-30 DIAGNOSIS — J9690 Respiratory failure, unspecified, unspecified whether with hypoxia or hypercapnia: Secondary | ICD-10-CM | POA: Diagnosis not present

## 2021-03-30 DIAGNOSIS — Z7189 Other specified counseling: Secondary | ICD-10-CM | POA: Diagnosis not present

## 2021-03-30 DIAGNOSIS — R0602 Shortness of breath: Secondary | ICD-10-CM | POA: Diagnosis not present

## 2021-03-30 DIAGNOSIS — G250 Essential tremor: Secondary | ICD-10-CM | POA: Diagnosis present

## 2021-03-30 DIAGNOSIS — R7881 Bacteremia: Secondary | ICD-10-CM

## 2021-03-30 DIAGNOSIS — R338 Other retention of urine: Secondary | ICD-10-CM | POA: Diagnosis present

## 2021-03-30 DIAGNOSIS — Z8249 Family history of ischemic heart disease and other diseases of the circulatory system: Secondary | ICD-10-CM

## 2021-03-30 DIAGNOSIS — E871 Hypo-osmolality and hyponatremia: Secondary | ICD-10-CM | POA: Diagnosis present

## 2021-03-30 DIAGNOSIS — Z515 Encounter for palliative care: Secondary | ICD-10-CM

## 2021-03-30 DIAGNOSIS — I495 Sick sinus syndrome: Secondary | ICD-10-CM | POA: Diagnosis present

## 2021-03-30 DIAGNOSIS — J189 Pneumonia, unspecified organism: Secondary | ICD-10-CM | POA: Diagnosis not present

## 2021-03-30 DIAGNOSIS — I10 Essential (primary) hypertension: Secondary | ICD-10-CM | POA: Diagnosis not present

## 2021-03-30 DIAGNOSIS — Z950 Presence of cardiac pacemaker: Secondary | ICD-10-CM | POA: Diagnosis not present

## 2021-03-30 DIAGNOSIS — E782 Mixed hyperlipidemia: Secondary | ICD-10-CM | POA: Diagnosis present

## 2021-03-30 DIAGNOSIS — Z825 Family history of asthma and other chronic lower respiratory diseases: Secondary | ICD-10-CM

## 2021-03-30 DIAGNOSIS — D849 Immunodeficiency, unspecified: Secondary | ICD-10-CM | POA: Diagnosis present

## 2021-03-30 DIAGNOSIS — G7000 Myasthenia gravis without (acute) exacerbation: Secondary | ICD-10-CM | POA: Diagnosis present

## 2021-03-30 DIAGNOSIS — I82401 Acute embolism and thrombosis of unspecified deep veins of right lower extremity: Secondary | ICD-10-CM | POA: Diagnosis not present

## 2021-03-30 DIAGNOSIS — I110 Hypertensive heart disease with heart failure: Secondary | ICD-10-CM | POA: Diagnosis present

## 2021-03-30 DIAGNOSIS — R451 Restlessness and agitation: Secondary | ICD-10-CM | POA: Diagnosis not present

## 2021-03-30 DIAGNOSIS — M7989 Other specified soft tissue disorders: Secondary | ICD-10-CM | POA: Diagnosis not present

## 2021-03-30 DIAGNOSIS — Z7952 Long term (current) use of systemic steroids: Secondary | ICD-10-CM

## 2021-03-30 DIAGNOSIS — K5900 Constipation, unspecified: Secondary | ICD-10-CM | POA: Diagnosis not present

## 2021-03-30 DIAGNOSIS — Z8261 Family history of arthritis: Secondary | ICD-10-CM

## 2021-03-30 DIAGNOSIS — F0390 Unspecified dementia without behavioral disturbance: Secondary | ICD-10-CM | POA: Diagnosis not present

## 2021-03-30 DIAGNOSIS — R609 Edema, unspecified: Secondary | ICD-10-CM | POA: Diagnosis not present

## 2021-03-30 LAB — CBC WITH DIFFERENTIAL/PLATELET
Abs Immature Granulocytes: 0.2 10*3/uL — ABNORMAL HIGH (ref 0.00–0.07)
Basophils Absolute: 0 10*3/uL (ref 0.0–0.1)
Basophils Relative: 0 %
Eosinophils Absolute: 0 10*3/uL (ref 0.0–0.5)
Eosinophils Relative: 0 %
HCT: 28.8 % — ABNORMAL LOW (ref 39.0–52.0)
Hemoglobin: 9.1 g/dL — ABNORMAL LOW (ref 13.0–17.0)
Immature Granulocytes: 1 %
Lymphocytes Relative: 2 %
Lymphs Abs: 0.3 10*3/uL — ABNORMAL LOW (ref 0.7–4.0)
MCH: 32.9 pg (ref 26.0–34.0)
MCHC: 31.6 g/dL (ref 30.0–36.0)
MCV: 104 fL — ABNORMAL HIGH (ref 80.0–100.0)
Monocytes Absolute: 0.7 10*3/uL (ref 0.1–1.0)
Monocytes Relative: 4 %
Neutro Abs: 16.8 10*3/uL — ABNORMAL HIGH (ref 1.7–7.7)
Neutrophils Relative %: 93 %
Platelets: 231 10*3/uL (ref 150–400)
RBC: 2.77 MIL/uL — ABNORMAL LOW (ref 4.22–5.81)
RDW: 17.9 % — ABNORMAL HIGH (ref 11.5–15.5)
WBC: 18 10*3/uL — ABNORMAL HIGH (ref 4.0–10.5)
nRBC: 0 % (ref 0.0–0.2)

## 2021-03-30 LAB — COMPREHENSIVE METABOLIC PANEL
ALT: 15 U/L (ref 0–44)
AST: 20 U/L (ref 15–41)
Albumin: 2.5 g/dL — ABNORMAL LOW (ref 3.5–5.0)
Alkaline Phosphatase: 92 U/L (ref 38–126)
Anion gap: 7 (ref 5–15)
BUN: 20 mg/dL (ref 8–23)
CO2: 24 mmol/L (ref 22–32)
Calcium: 8.2 mg/dL — ABNORMAL LOW (ref 8.9–10.3)
Chloride: 100 mmol/L (ref 98–111)
Creatinine, Ser: 1.15 mg/dL (ref 0.61–1.24)
GFR, Estimated: >60 mL/min (ref >60)
Glucose, Bld: 126 mg/dL — ABNORMAL HIGH (ref 70–99)
Potassium: 4.2 mmol/L (ref 3.5–5.1)
Sodium: 131 mmol/L — ABNORMAL LOW (ref 135–145)
Total Bilirubin: 1.3 mg/dL — ABNORMAL HIGH (ref 0.3–1.2)
Total Protein: 5.6 g/dL — ABNORMAL LOW (ref 6.5–8.1)

## 2021-03-30 LAB — PROTIME-INR
INR: 2.6 — ABNORMAL HIGH (ref 0.8–1.2)
Prothrombin Time: 27.9 seconds — ABNORMAL HIGH (ref 11.4–15.2)

## 2021-03-30 LAB — RESP PANEL BY RT-PCR (FLU A&B, COVID) ARPGX2
Influenza A by PCR: NEGATIVE
Influenza B by PCR: NEGATIVE
SARS Coronavirus 2 by RT PCR: NEGATIVE

## 2021-03-30 LAB — RETICULOCYTES
Immature Retic Fract: 25.9 % — ABNORMAL HIGH (ref 2.3–15.9)
RBC.: 2.43 MIL/uL — ABNORMAL LOW (ref 4.22–5.81)
Retic Count, Absolute: 190.3 10*3/uL — ABNORMAL HIGH (ref 19.0–186.0)
Retic Ct Pct: 7.8 % — ABNORMAL HIGH (ref 0.4–3.1)

## 2021-03-30 LAB — LACTIC ACID, PLASMA: Lactic Acid, Venous: 1.2 mmol/L (ref 0.5–1.9)

## 2021-03-30 MED ORDER — NUTRITIONAL SUPPLEMENT PO LIQD: Freq: Two times a day (BID) | ORAL | Status: DC

## 2021-03-30 MED ORDER — FENTANYL CITRATE (PF) 100 MCG/2ML IJ SOLN
100.0000 ug | Freq: Once | INTRAMUSCULAR | Status: AC
  Administered 2021-03-30: 100 ug via INTRAVENOUS
  Filled 2021-03-30: qty 2

## 2021-03-30 MED ORDER — METHOCARBAMOL 500 MG PO TABS
500.0000 mg | ORAL_TABLET | Freq: Four times a day (QID) | ORAL | Status: DC | PRN
  Administered 2021-03-30 – 2021-04-06 (×7): 500 mg via ORAL
  Filled 2021-03-30 (×7): qty 1

## 2021-03-30 MED ORDER — HYDRALAZINE HCL 25 MG PO TABS: 25.0000 mg | ORAL_TABLET | Freq: Four times a day (QID) | ORAL | Status: DC | PRN

## 2021-03-30 MED ORDER — DOXYCYCLINE HYCLATE 100 MG PO TABS
100.0000 mg | ORAL_TABLET | Freq: Two times a day (BID) | ORAL | Status: DC
  Administered 2021-03-31 – 2021-04-04 (×9): 100 mg via ORAL
  Filled 2021-03-30 (×9): qty 1

## 2021-03-30 MED ORDER — PYRIDOSTIGMINE BROMIDE 60 MG PO TABS
60.0000 mg | ORAL_TABLET | ORAL | Status: DC
  Administered 2021-03-30 – 2021-04-07 (×30): 60 mg via ORAL
  Filled 2021-03-30 (×34): qty 1

## 2021-03-30 MED ORDER — TAMSULOSIN HCL 0.4 MG PO CAPS
0.4000 mg | ORAL_CAPSULE | Freq: Every day | ORAL | Status: DC
  Administered 2021-03-30 – 2021-04-07 (×9): 0.4 mg via ORAL
  Filled 2021-03-30 (×9): qty 1

## 2021-03-30 MED ORDER — FINASTERIDE 5 MG PO TABS
5.0000 mg | ORAL_TABLET | Freq: Every morning | ORAL | Status: DC
  Administered 2021-03-31 – 2021-04-07 (×8): 5 mg via ORAL
  Filled 2021-03-30 (×6): qty 1

## 2021-03-30 MED ORDER — DOXYCYCLINE HYCLATE 100 MG PO TABS
100.0000 mg | ORAL_TABLET | Freq: Once | ORAL | Status: AC
  Administered 2021-03-30: 100 mg via ORAL
  Filled 2021-03-30: qty 1

## 2021-03-30 MED ORDER — TRIPLE ANTIBIOTIC EX OINT: 1.0000 "application " | TOPICAL_OINTMENT | CUTANEOUS | Status: DC

## 2021-03-30 MED ORDER — ONDANSETRON HCL 4 MG PO TABS: 4.0000 mg | ORAL_TABLET | Freq: Three times a day (TID) | ORAL | Status: DC | PRN

## 2021-03-30 MED ORDER — HYDROCORTISONE NA SUCCINATE PF 100 MG IJ SOLR
50.0000 mg | Freq: Four times a day (QID) | INTRAMUSCULAR | Status: DC
  Administered 2021-03-30 – 2021-04-02 (×12): 50 mg via INTRAVENOUS
  Filled 2021-03-30 (×12): qty 2

## 2021-03-30 MED ORDER — LORAZEPAM 0.5 MG PO TABS
0.5000 mg | ORAL_TABLET | Freq: Every evening | ORAL | Status: DC | PRN
  Administered 2021-03-30 – 2021-04-04 (×6): 0.5 mg via ORAL
  Administered 2021-04-05: 0.25 mg via ORAL
  Filled 2021-03-30 (×7): qty 1

## 2021-03-30 MED ORDER — GUAIFENESIN-DM 100-10 MG/5ML PO SYRP: 5.0000 mL | ORAL_SOLUTION | ORAL | Status: DC | PRN

## 2021-03-30 MED ORDER — BACITRACIN-NEOMYCIN-POLYMYXIN OINTMENT TUBE
TOPICAL_OINTMENT | CUTANEOUS | Status: DC
  Filled 2021-03-30: qty 14

## 2021-03-30 MED ORDER — DOCUSATE SODIUM 100 MG PO CAPS
100.0000 mg | ORAL_CAPSULE | Freq: Two times a day (BID) | ORAL | Status: DC
  Administered 2021-03-30 – 2021-04-05 (×13): 100 mg via ORAL
  Filled 2021-03-30 (×13): qty 1

## 2021-03-30 MED ORDER — RISPERIDONE 0.5 MG PO TABS
0.5000 mg | ORAL_TABLET | Freq: Every evening | ORAL | Status: DC | PRN
  Administered 2021-03-30: 0.5 mg via ORAL
  Filled 2021-03-30: qty 1

## 2021-03-30 MED ORDER — ACETAMINOPHEN 325 MG PO TABS
650.0000 mg | ORAL_TABLET | Freq: Four times a day (QID) | ORAL | Status: DC | PRN
  Administered 2021-03-30 – 2021-04-05 (×10): 650 mg via ORAL
  Filled 2021-03-30 (×11): qty 2

## 2021-03-30 MED ORDER — DEXTROMETHORPHAN-GUAIFENESIN 10-100 MG/5ML PO SYRP: 10.0000 mL | ORAL_SOLUTION | ORAL | Status: DC | PRN

## 2021-03-30 MED ORDER — OXYCODONE HCL 5 MG PO TABS
5.0000 mg | ORAL_TABLET | Freq: Four times a day (QID) | ORAL | Status: DC | PRN
  Administered 2021-03-30 – 2021-04-06 (×11): 5 mg via ORAL
  Filled 2021-03-30 (×11): qty 1

## 2021-03-30 MED ORDER — IOHEXOL 350 MG/ML SOLN
100.0000 mL | Freq: Once | INTRAVENOUS | Status: AC | PRN
  Administered 2021-03-30: 100 mL via INTRAVENOUS

## 2021-03-30 MED ORDER — ALBUMIN HUMAN 25 % IV SOLN
25.0000 g | Freq: Four times a day (QID) | INTRAVENOUS | Status: AC
  Administered 2021-03-30 – 2021-03-31 (×4): 25 g via INTRAVENOUS
  Filled 2021-03-30 (×4): qty 100

## 2021-03-30 NOTE — ED Notes (Addendum)
Pt seen by EDP prior to RN assessment, see MD notes, orders received and initiated, pt in xray/ CTA at this time.

## 2021-03-30 NOTE — ED Notes (Signed)
Pt alert, NAD, calm, interactive, resps e/u, speaking in clear complete sentences, holds conversation, intermittent and frequent moaning, denies pain, verbalizes nothing more than usual, moans out of habit b/c it makes him feel better, denies any areas of pain.

## 2021-03-30 NOTE — H&P (Signed)
History and Physical    [name] KZS:[id number] DOB: [birth year] DOA: 03/22/2021  PCP: Shirline Frees, MD (Confirm with patient/family/NH records and if not entered, this has to be entered at [facility] point of entry) Patient coming from: Rehab  I have personally briefly reviewed patient's old medical records in [place]  Chief Complaint: I have clot in my leg  HPI: [name] is a 87 y.o. male with medical history significant of right hip fracture status post ORIF with intramedullary nailing, urinary retention status post Foley catheter, myasthenia gravis on chronic steroid, HTN, BPH, dementia, essential tremor presented with hypotension.  Patient was discharged from hospital service 1 week ago after ORIF of right hip.  Patient was discharged on aspirin Plavix.  Then, over the last 2 to 3 days, patient developed right leg swelling and pain, and yesterday DVT study turned positive for right leg DVT.  Then patient was started on Xarelto overnight.  This morning, patient was found to be in respiratory distress and blood pressure borderline low, and rehab facility staff decided to send patient to ED for further evaluation.  Patient complains about feeling shortness of breath but no cough no fever chills.  Patient developed a rash on his left elbow, no pain.  No fever or chills.   ED Course: BP borderline low, hypoxia stabilized on 2 L, CTA positive for left lower lobe subsegmental PE.  Blood work WBC 18, sodium 126.  Review of Systems: As per HPI otherwise 14 point review of systems negative.    Past Medical History:  Diagnosis Date   Carotid artery disease ([employer])    Coronary artery disease    PCI RCA 2009   Dyslipidemia    Hypertension    Scleritis, unspecified    treated with prednisone   Seizure ([place])    felt due to cyclosporin in setting of electrolyte disorder   Solitary kidney    Symptomatic bradycardia    a. s/p STJ dual chamber pacemaker    Past Surgical History:   Procedure Laterality Date   CARDIAC CATHETERIZATION     12 years ago?   COLONOSCOPY     EYE SURGERY     right eye about 10 years ago   INTRAMEDULLARY (IM) NAIL INTERTROCHANTERIC Right 03/19/2021   Procedure: INTRAMEDULLARY (IM) NAIL ANTERIOR TROCHANTRIC;  Surgeon: Nicholes Stairs, MD;  Location: [facility];  Service: Orthopedics;  Laterality: Right;   ORIF HUMERUS FRACTURE Right 07/03/2019   Procedure: Right distal humerus open reduction, internal fixation;  Surgeon: Verner Mould, MD;  Location: [place];  Service: Orthopedics;  Laterality: Right;   PACEMAKER INSERTION  2009   STJ dual chamber pacemaker implanted by Dr Olevia Perches for symptomatic bradycardia     reports that he has never smoked. He has never used smokeless tobacco. He reports current alcohol use. He reports that he does not use drugs.  Allergies  Allergen Reactions   Cefuroxime Axetil Other (See Comments)    Reaction not recalled   Cyclosporine Other (See Comments)    Reaction not recalled   Elemental Sulfur Other (See Comments)    No energy, could not walk    Maxzide [Triamterene-Hctz] Other (See Comments)    Adverse reaction- bloating and abdominal pain   Sulfa Antibiotics Other (See Comments)    Could not sleep/eat and experienced gastric pain   Sulfamethoxazole Other (See Comments)    Could not sleep/eat and had gastric pain   Sulfamethoxazole-Trimethoprim Other (See Comments)  Gastric pain and could not eat/sleep   Prednisone Other (See Comments)    Can tolerate for a short period of time    Family History  Problem Relation Age of Onset   Arthritis/Rheumatoid Mother    Heart disease Mother    COPD Father    Alcohol abuse Son      Prior to Admission medications   Medication Sig Start Date End Date Taking? Authorizing Provider  acetaminophen (TYLENOL) 325 MG tablet Take 650 mg by mouth every 6 (six) hours as needed for mild pain or fever.   Yes [provider]  amLODipine (NORVASC) 2.5  MG tablet Take 2.5 mg by mouth every morning.   Yes [provider]  carvedilol (COREG) 3.125 MG tablet Take 1 tablet (3.125 mg total) by mouth 2 (two) times daily. 03/23/21  Yes Shelly Coss, MD  Dextromethorphan-guaiFENesin 10-100 MG/5ML liquid Take 10 mLs by mouth every 4 (four) hours as needed (cough/congestion).   Yes [provider]  docusate sodium (COLACE) 100 MG capsule Take 1 capsule (100 mg total) by mouth 2 (two) times daily. 03/23/21  Yes Shelly Coss, MD  finasteride (PROSCAR) 5 MG tablet Take 5 mg by mouth every morning.   Yes [provider]  LORazepam (ATIVAN) 0.5 MG tablet Take 1 tablet (0.5 mg total) by mouth at bedtime as needed (insomnia). 03/22/21  Yes Shelly Coss, MD  methocarbamol (ROBAXIN) 500 MG tablet Take 1 tablet (500 mg total) by mouth every 6 (six) hours as needed for muscle spasms. 03/23/21  Yes Shelly Coss, MD  Neomycin-Bacitracin-Polymyxin (TRIPLE ANTIBIOTIC) OINT Apply 1 application topically See admin instructions. Apply small amount of triple antibiotic ointment to non-stick pad and cover wound, wrap with coban until healed   Yes [provider]  NITROSTAT 0.4 MG SL tablet DISSOLVE ONE TABLET UNDER THE TONGUE EVERY 5 MINUTES AS NEEDED FOR CHEST PAIN.  DO NOT EXCEED A TOTAL OF 3 DOSES IN 15 MINUTES Patient taking differently: Place 0.4 mg under the tongue every 5 (five) minutes x 3 doses as needed for chest pain. 11/19/18  Yes Fay Records, MD  ondansetron (ZOFRAN) 4 MG tablet Take 4 mg by mouth every 8 (eight) hours as needed for nausea or vomiting.   Yes [provider]  oxyCODONE (OXY IR/ROXICODONE) 5 MG immediate release tablet Take 1 tablet (5 mg total) by mouth every 6 (six) hours as needed for severe pain. 03/22/21  Yes Shelly Coss, MD  predniSONE (DELTASONE) 20 MG tablet Take 1 tablet (20 mg total) by mouth daily with breakfast. 08/16/20  Yes Patel, Donika K, DO  pyridostigmine (MESTINON) 60 MG tablet Take 1  tablet at 7am, 10am 1pm, and 5pm Patient taking differently: Take 60 mg by mouth See admin instructions. Take one tablet (60 mg) by mouth four times daily - 6:30am, 10am, 1pm and 5pm 07/16/20  Yes Patel, Donika K, DO  risperiDONE (RISPERDAL) 0.5 MG tablet Take 1 tablet (0.5 mg total) by mouth at bedtime as needed (agitation). 03/23/21  Yes Shelly Coss, MD  tamsulosin (FLOMAX) 0.4 MG CAPS capsule Take 1 capsule (0.4 mg total) by mouth daily. 03/23/21  Yes Adhikari, Tamsen Meek, MD  XARELTO 15 MG TABS tablet Take 15 mg by mouth 2 (two) times daily. 03/29/21 04/19/21 Yes [provider]  Ensure (ENSURE) Take 237 mLs by mouth daily as needed (meal refusal).    [provider]  Nutritional Supplements (NUTRITIONAL SUPPLEMENT PO) Take 1 Can by mouth 2 (two) times daily. Muscle  Milk (provided by family)    [provider]    Physical Exam: Vitals:   03/26/2021 1530 04/04/2021 1545 03/20/2021 1600 03/29/2021 1615  BP: (!) 117/57 (!) 91/50 (!) 100/47 (!) 92/47  Pulse: 76 70 66 68  Resp: 19 12 19 12   Temp:      TempSrc:      SpO2: 98% 98% 93% 99%  Weight:      Height:        Constitutional: NAD, calm, comfortable Vitals:   04/09/2021 1530 04/12/2021 1545 04/03/2021 1600 03/29/2021 1615  BP: (!) 117/57 (!) 91/50 (!) 100/47 (!) 92/47  Pulse: 76 70 66 68  Resp: 19 12 19 12   Temp:      TempSrc:      SpO2: 98% 98% 93% 99%  Weight:      Height:       Eyes: PERRL, lids and conjunctivae normal ENMT: Mucous membranes are moist. Posterior pharynx clear of any exudate or lesions.Normal dentition.  Neck: normal, supple, no masses, no thyromegaly Respiratory: clear to auscultation bilaterally, no wheezing, no crackles. Normal respiratory effort. No accessory muscle use.  Cardiovascular: Regular rate and rhythm, no murmurs / rubs / gallops. 2+ extremity edema right shin area. 2+ pedal pulses. No carotid bruits.  Abdomen: no tenderness, no masses palpated. No hepatosplenomegaly. Bowel sounds  positive.  Musculoskeletal: no clubbing / cyanosis. No joint deformity upper and lower extremities. Good ROM, no contractures. Normal muscle tone.  Skin: Rash, swelling on left elbow, normal ROM Neurologic: CN 2-12 grossly intact. Sensation intact, DTR normal. Strength 5/5 in all 4.  Psychiatric: Normal judgment and insight. Alert and oriented x 3. Normal mood.     Labs on Admission: I have personally reviewed following labs and imaging studies  CBC: Recent Labs  Lab 04/07/2021 1255  WBC 18.0*  NEUTROABS 16.8*  HGB 9.1*  HCT 28.8*  MCV 104.0*  PLT 539   Basic Metabolic Panel: Recent Labs  Lab 04/04/2021 1255  NA 131*  K 4.2  CL 100  CO2 24  GLUCOSE 126*  BUN 20  CREATININE 1.15  CALCIUM 8.2*   GFR: Estimated Creatinine Clearance: 41.5 mL/min (by C-G formula based on SCr of 1.15 mg/dL). Liver Function Tests: Recent Labs  Lab 04/03/2021 1255  AST 20  ALT 15  ALKPHOS 92  BILITOT 1.3*  PROT 5.6*  ALBUMIN 2.5*   No results for input(s): LIPASE, AMYLASE in the last 168 hours. No results for input(s): AMMONIA in the last 168 hours. Coagulation Profile: Recent Labs  Lab 04/11/2021 1255  INR 2.6*   Cardiac Enzymes: No results for input(s): CKTOTAL, CKMB, CKMBINDEX, TROPONINI in the last 168 hours. BNP (last 3 results) No results for input(s): PROBNP in the last 8760 hours. HbA1C: No results for input(s): HGBA1C in the last 72 hours. CBG: No results for input(s): GLUCAP in the last 168 hours. Lipid Profile: No results for input(s): CHOL, HDL, LDLCALC, TRIG, CHOLHDL, LDLDIRECT in the last 72 hours. Thyroid Function Tests: No results for input(s): TSH, T4TOTAL, FREET4, T3FREE, THYROIDAB in the last 72 hours. Anemia Panel: No results for input(s): VITAMINB12, FOLATE, FERRITIN, TIBC, IRON, RETICCTPCT in the last 72 hours. Urine analysis:    Component Value Date/Time   COLORURINE YELLOW 03/18/2021 2255   APPEARANCEUR CLEAR 03/18/2021 2255   APPEARANCEUR Cloudy (A)  08/03/2020 1420   LABSPEC 1.021 03/18/2021 2255   PHURINE 5.0 03/18/2021 2255   GLUCOSEU 50 (A) 03/18/2021 2255   GLUCOSEU NEGATIVE 08/14/2011 1058  HGBUR NEGATIVE 03/18/2021 2255   BILIRUBINUR NEGATIVE 03/18/2021 2255   BILIRUBINUR Negative 08/03/2020 1420   [place] 03/18/2021 2255   PROTEINUR NEGATIVE 03/18/2021 2255   UROBILINOGEN 0.2 08/14/2011 1058   NITRITE NEGATIVE 03/18/2021 2255   LEUKOCYTESUR NEGATIVE 03/18/2021 2255    Radiological Exams on Admission: DG Chest 2 View  Result Date: 03/25/2021 CLINICAL DATA:  Sepsis EXAM: CHEST - 2 VIEW COMPARISON:  03/18/2021 FINDINGS: Bilateral diffuse interstitial thickening consistent with chronic interstitial lung disease. No focal consolidation, pleural effusion or pneumothorax. Stable cardiomegaly. Thoracic aortic atherosclerosis. Dual lead cardiac pacemaker. No acute osseous abnormality. IMPRESSION: No acute cardiopulmonary disease. Chronic interstitial lung disease. Electronically Signed   By: Kathreen Devoid   On: 04/01/2021 13:08   CT Angio Chest PE W and/or Wo Contrast  Result Date: 03/23/2021 CLINICAL DATA:  Positive for right lower extremity DVT. Right hip surgery 1 month ago. EXAM: CT ANGIOGRAPHY CHEST WITH CONTRAST TECHNIQUE: Multidetector CT imaging of the chest was performed using the standard protocol during bolus administration of intravenous contrast. Multiplanar CT image reconstructions and MIPs were obtained to evaluate the vascular anatomy. CONTRAST:  155mL OMNIPAQUE IOHEXOL 350 MG/ML SOLN COMPARISON:  10/15/2008 FINDINGS: Cardiovascular: Satisfactory opacification of the pulmonary arteries to the segmental level. Occlusive pulmonary embolus involving a segmental branch of the left lower lobe. Stable cardiomegaly. No pericardial effusion. Thoracic aortic atherosclerosis. Multi vessel coronary artery atherosclerosis. Mediastinum/Nodes: No enlarged mediastinal, hilar, or axillary lymph nodes. Thyroid gland, trachea, and  esophagus demonstrate no significant findings. Lungs/Pleura: Bilateral chronic interstitial lung disease. 7.4 mm right lower lobe bulla. Upper Abdomen: No acute upper abdominal abnormality. Musculoskeletal: No acute osseous abnormality. No aggressive osseous lesion. Mild thoracic spine spondylosis. Review of the MIP images confirms the above findings. IMPRESSION: 1. Occlusive pulmonary embolus involving a segmental branch of the left lower lobe. 2. Chronic interstitial lung disease. 3.  Aortic Atherosclerosis (ICD10-I70.0). 4. Multi vessel coronary artery atherosclerosis. Electronically Signed   By: Kathreen Devoid   On: 03/23/2021 15:44    EKG: Independently reviewed.  Sinus, no acute ST changes  Assessment/Plan Active Problems:   Pulmonary emboli (HCC)  (please populate well all problems here in Problem List. (For example, if patient is on BP meds at home and you resume or decide to hold them, it is a problem that needs to be her. Same for CAD, COPD, HLD and so on)  Sepsis -Evidenced by hypotension and elevated WBC count, source is left elbow cellulitis. -Ordered x-ray to rule out foreign body and gas formation. -Continue doxycycline twice daily.  Acute hypoxic respite failure -Secondary to PE. -No chest pains, hypoxia is mild, continue Xarelto. -Given the size of the clotting and no signs of right heart strain on CTA, PE unlike to cause hypotension in this case. -Echocardiogram.  DVT, provoked -Xarelto 3 to 73-month.  HTN -Hold all home BP meds now.  MG -Continue Pyridostigmine  Chronic steroid use -switch to stress dose of hydrocortisone 50 mg every 6 hours.  Anemia -Chronic, probably from surgical loss with ORIF -Check iron study.  Hyponatremia -Stable, monitoring.  Recent ORIF on right hip -Surgical wound clean, no S/S of acute issue -PT evaluation  Chronic indwelling Foley -UA pending -Continue Foley care.   DVT prophylaxis: Xarelto Code Status: Full Code Family  Communication: Daughter at bedside Disposition Plan: Expect more than 2 midnight hospital stay, to treat  Consults called: None Admission status: Tele admit   Lequita Halt MD [facility] Pager [phone]  03/26/2021, 5:17  PM

## 2021-03-30 NOTE — ED Provider Notes (Signed)
[facility] EMERGENCY DEPARTMENT Provider Note   CSN: [id number] Arrival date & time: 03/21/2021  1158     History Chief Complaint  Patient presents with   Circulatory Problem    [name] is a 87 y.o. male.  HPI  This is an 87 year old male with a history of coronary disease dyslipidemia hypertension, he has a solitary kidney.  He presents to the hospital today with a complaint of right leg swelling, he was evidently evaluated for blood clot at his facility at the rehab where he is currently staying after having surgery on his right femur 11 days ago.  He was found to have a positive DVT study and sent to the hospital.  He was also found to be slightly hypoxic by paramedics at 86% on room air and required 2 L by nasal cannula.  The patient does not have any other significant complaints at this time, he does have dementia and a level 5 caveat applies  Past Medical History:  Diagnosis Date   Carotid artery disease ([place])    Coronary artery disease    PCI RCA 2009   Dyslipidemia    Hypertension    Scleritis, unspecified    treated with prednisone   Seizure ([place])    felt due to cyclosporin in setting of electrolyte disorder   Solitary kidney    Symptomatic bradycardia    a. s/p STJ dual chamber pacemaker    Patient Active Problem List   Diagnosis Date Noted   Hyperglycemia 03/19/2021   Closed displaced intertrochanteric fracture of right femur ([place]) 03/18/2021   Renal insufficiency 03/18/2021   Hyponatremia 03/18/2021   Late onset Alzheimer's disease with behavioral disturbance ([place]) 11/02/2020   Benign prostatic hyperplasia with urinary frequency 08/27/2020   Myasthenia gravis ([place]) 08/27/2020   AMS (altered mental status) 86/[birth year]1950   Toxic metabolic encephalopathy 93/26/7124   Stroke-like symptom 05/12/2020   Leukocytosis 05/12/2020   Macrocytosis 05/12/2020   Stroke-like symptoms 05/12/2020   Closed fracture of right distal humerus 07/03/2019    Carotid artery disease ([place]) 09/09/2018   Pacemaker-[employer] 07/03/2012   Bruit 09/28/2011   Tongue lesion 08/15/2011   Essential hypertension 06/23/2010   Mixed hyperlipidemia 09/22/2008   Coronary artery disease involving native coronary artery of native heart without angina pectoris 09/22/2008   Edema 09/22/2008   SSS (sick sinus syndrome) ([place]) 09/22/2008    Past Surgical History:  Procedure Laterality Date   CARDIAC CATHETERIZATION     12 years ago?   COLONOSCOPY     EYE SURGERY     right eye about 10 years ago   INTRAMEDULLARY (IM) NAIL INTERTROCHANTERIC Right 03/19/2021   Procedure: INTRAMEDULLARY (IM) NAIL ANTERIOR TROCHANTRIC;  Surgeon: Nicholes Stairs, MD;  Location: [place];  Service: Orthopedics;  Laterality: Right;   ORIF HUMERUS FRACTURE Right 07/03/2019   Procedure: Right distal humerus open reduction, internal fixation;  Surgeon: Verner Mould, MD;  Location: [place];  Service: Orthopedics;  Laterality: Right;   PACEMAKER INSERTION  2009   STJ dual chamber pacemaker implanted by Dr Olevia Perches for symptomatic bradycardia       Family History  Problem Relation Age of Onset   Arthritis/Rheumatoid Mother    Heart disease Mother    COPD Father    Alcohol abuse Son     Social History   Tobacco Use   Smoking status: Never   Smokeless tobacco: Never  Vaping Use   Vaping Use: Never used  Substance Use Topics  Alcohol use: Yes    Comment: rarely   Drug use: No    Home Medications Prior to Admission medications   Medication Sig Start Date End Date Taking? Authorizing Provider  acetaminophen (TYLENOL) 325 MG tablet Take 650 mg by mouth every 6 (six) hours as needed for mild pain or fever.   Yes [provider]  amLODipine (NORVASC) 2.5 MG tablet Take 2.5 mg by mouth every morning.   Yes [provider]  carvedilol (COREG) 3.125 MG tablet Take 1 tablet (3.125 mg total) by mouth 2 (two) times daily. 03/23/21  Yes Shelly Coss, MD   Dextromethorphan-guaiFENesin 10-100 MG/5ML liquid Take 10 mLs by mouth every 4 (four) hours as needed (cough/congestion).   Yes [provider]  docusate sodium (COLACE) 100 MG capsule Take 1 capsule (100 mg total) by mouth 2 (two) times daily. 03/23/21  Yes Shelly Coss, MD  finasteride (PROSCAR) 5 MG tablet Take 5 mg by mouth every morning.   Yes [provider]  LORazepam (ATIVAN) 0.5 MG tablet Take 1 tablet (0.5 mg total) by mouth at bedtime as needed (insomnia). 03/22/21  Yes Shelly Coss, MD  methocarbamol (ROBAXIN) 500 MG tablet Take 1 tablet (500 mg total) by mouth every 6 (six) hours as needed for muscle spasms. 03/23/21  Yes Shelly Coss, MD  Neomycin-Bacitracin-Polymyxin (TRIPLE ANTIBIOTIC) OINT Apply 1 application topically See admin instructions. Apply small amount of triple antibiotic ointment to non-stick pad and cover wound, wrap with coban until healed   Yes [provider]  NITROSTAT 0.4 MG SL tablet DISSOLVE ONE TABLET UNDER THE TONGUE EVERY 5 MINUTES AS NEEDED FOR CHEST PAIN.  DO NOT EXCEED A TOTAL OF 3 DOSES IN 15 MINUTES Patient taking differently: Place 0.4 mg under the tongue every 5 (five) minutes x 3 doses as needed for chest pain. 11/19/18  Yes Fay Records, MD  ondansetron (ZOFRAN) 4 MG tablet Take 4 mg by mouth every 8 (eight) hours as needed for nausea or vomiting.   Yes [provider]  oxyCODONE (OXY IR/ROXICODONE) 5 MG immediate release tablet Take 1 tablet (5 mg total) by mouth every 6 (six) hours as needed for severe pain. 03/22/21  Yes Shelly Coss, MD  predniSONE (DELTASONE) 20 MG tablet Take 1 tablet (20 mg total) by mouth daily with breakfast. 08/16/20  Yes Patel, Donika K, DO  pyridostigmine (MESTINON) 60 MG tablet Take 1 tablet at 7am, 10am 1pm, and 5pm Patient taking differently: Take 60 mg by mouth See admin instructions. Take one tablet (60 mg) by mouth four times daily - 6:30am, 10am, 1pm and 5pm 07/16/20  Yes Patel,  Donika K, DO  risperiDONE (RISPERDAL) 0.5 MG tablet Take 1 tablet (0.5 mg total) by mouth at bedtime as needed (agitation). 03/23/21  Yes Shelly Coss, MD  tamsulosin (FLOMAX) 0.4 MG CAPS capsule Take 1 capsule (0.4 mg total) by mouth daily. 03/23/21  Yes Adhikari, Tamsen Meek, MD  XARELTO 15 MG TABS tablet Take 15 mg by mouth 2 (two) times daily. 03/29/21 04/19/21 Yes [provider]  Ensure (ENSURE) Take 237 mLs by mouth daily as needed (meal refusal).    [provider]  Nutritional Supplements (NUTRITIONAL SUPPLEMENT PO) Take 1 Can by mouth 2 (two) times daily. Muscle Milk (provided by family)    [provider]    Allergies    Cefuroxime axetil, Cyclosporine, Elemental sulfur, Maxzide [triamterene-hctz], Sulfa antibiotics, Sulfamethoxazole, Sulfamethoxazole-trimethoprim, and Prednisone  Review of Systems   Review of Systems  Unable to  perform ROS: Dementia   Physical Exam Updated Vital Signs BP (!) 117/57   Pulse 76   Temp 98.6 F (37 C) (Oral)   Resp 19   Ht 1.702 m (5\' 7" )   Wt 73.5 kg   SpO2 98%   BMI 25.37 kg/m   Physical Exam Vitals and nursing note reviewed.  Constitutional:      General: He is not in acute distress.    Appearance: He is well-developed.  HENT:     Head: Normocephalic and atraumatic.     Mouth/Throat:     Pharynx: No oropharyngeal exudate.  Eyes:     General: No scleral icterus.       Right eye: No discharge.        Left eye: No discharge.     Conjunctiva/sclera: Conjunctivae normal.     Pupils: Pupils are equal, round, and reactive to light.  Neck:     Thyroid: No thyromegaly.     Vascular: No JVD.  Cardiovascular:     Rate and Rhythm: Normal rate and regular rhythm.     Heart sounds: Normal heart sounds. No murmur heard.   No friction rub. No gallop.  Pulmonary:     Effort: Pulmonary effort is normal. No respiratory distress.     Breath sounds: Normal breath sounds. No wheezing or rales.  Abdominal:     General: Bowel  sounds are normal. There is no distension.     Palpations: Abdomen is soft. There is no mass.     Tenderness: There is no abdominal tenderness.  Genitourinary:    Comments: Foley catheter in place Musculoskeletal:        General: No tenderness. Normal range of motion.     Cervical back: Normal range of motion and neck supple.     Right lower leg: Edema present.  Lymphadenopathy:     Cervical: No cervical adenopathy.  Skin:    General: Skin is warm and dry.     Findings: No erythema or rash.     Comments: Warmth and edema around the left elbow  Neurological:     Mental Status: He is alert.     Coordination: Coordination normal.     Comments: Follows commands, no facial droop, speech seems clear, answers questions to the best of his ability, is able to tell me where he is, does not know the name of the facility where he is currently living, confused about circumstances over the last couple of weeks  Psychiatric:        Behavior: Behavior normal.    ED Results / Procedures / Treatments   Labs (all labs ordered are listed, but only abnormal results are displayed) Labs Reviewed  COMPREHENSIVE METABOLIC PANEL - Abnormal; Notable for the following components:      Result Value   Sodium 131 (*)    Glucose, Bld 126 (*)    Calcium 8.2 (*)    Total Protein 5.6 (*)    Albumin 2.5 (*)    Total Bilirubin 1.3 (*)    All other components within normal limits  CBC WITH DIFFERENTIAL/PLATELET - Abnormal; Notable for the following components:   WBC 18.0 (*)    RBC 2.77 (*)    Hemoglobin 9.1 (*)    HCT 28.8 (*)    MCV 104.0 (*)    RDW 17.9 (*)    Neutro Abs 16.8 (*)    Lymphs Abs 0.3 (*)    Abs Immature Granulocytes 0.20 (*)    All  other components within normal limits  PROTIME-INR - Abnormal; Notable for the following components:   Prothrombin Time 27.9 (*)    INR 2.6 (*)    All other components within normal limits  CULTURE, BLOOD (ROUTINE X 2)  CULTURE, BLOOD (ROUTINE X 2)  URINE  CULTURE  RESP PANEL BY RT-PCR (FLU A&B, COVID) ARPGX2  LACTIC ACID, PLASMA  URINALYSIS, ROUTINE W REFLEX MICROSCOPIC    EKG EKG Interpretation  Date/Time:  Wednesday March 30 2021 13:11:25 EDT Ventricular Rate:  79 PR Interval:  165 QRS Duration: 88 QT Interval:  369 QTC Calculation: 423 R Axis:   -14 Text Interpretation: Sinus rhythm Confirmed by Noemi Chapel [phone]) on 03/29/2021 3:32:46 PM  Radiology DG Chest 2 View  Result Date: 04/05/2021 CLINICAL DATA:  Sepsis EXAM: CHEST - 2 VIEW COMPARISON:  03/18/2021 FINDINGS: Bilateral diffuse interstitial thickening consistent with chronic interstitial lung disease. No focal consolidation, pleural effusion or pneumothorax. Stable cardiomegaly. Thoracic aortic atherosclerosis. Dual lead cardiac pacemaker. No acute osseous abnormality. IMPRESSION: No acute cardiopulmonary disease. Chronic interstitial lung disease. Electronically Signed   By: Kathreen Devoid   On: 03/22/2021 13:08    Procedures .Critical Care  Date/Time: 03/18/2021 3:42 PM Performed by: Noemi Chapel, MD Authorized by: Noemi Chapel, MD   Critical care provider statement:    Critical care time (minutes):  35   Critical care time was exclusive of:  Separately billable procedures and treating other patients and teaching time   Critical care was necessary to treat or prevent imminent or life-threatening deterioration of the following conditions: PE.   Critical care was time spent personally by me on the following activities:  Blood draw for specimens, development of treatment plan with patient or surrogate, discussions with consultants, evaluation of patient's response to treatment, examination of patient, obtaining history from patient or surrogate, ordering and performing treatments and interventions, ordering and review of laboratory studies, ordering and review of radiographic studies, pulse oximetry, re-evaluation of patient's condition and review of old charts    Medications Ordered in ED Medications  doxycycline (VIBRA-TABS) tablet 100 mg (has no administration in time range)  iohexol (OMNIPAQUE) 350 MG/ML injection 100 mL (100 mLs Intravenous Contrast Given 03/18/2021 1507)  fentaNYL (SUBLIMAZE) injection 100 mcg (100 mcg Intravenous Given 03/21/2021 1532)    ED Course  I have reviewed the triage vital signs and the nursing notes.  Pertinent labs & imaging results that were available during my care of the patient were reviewed by me and considered in my medical decision making (see chart for details).    MDM Rules/Calculators/A&P                          Overall this patient is awake and alert and able to follow my commands however he clearly has edema of his right leg and a reported history of a DVT on that side.  He possibly has an increased oxygen requirement and will need to be investigated for pulmonary embolism.  He is accompanied by his DVT ultrasound report showing a positive study, he was started on Xarelto yesterday, he has also had a leukocytosis which has increased today compared to the labs that accompany him from yesterday.  His blood pressure was initially very hypotensive but with some fluids he has improved, given his mild hypotension his leukocytosis the change in mental status from baseline the presence of cellulitis on his arm the new onset DVT and the 2 L of nasal  cannula oxygen that he requires he would likely be better served in the hospital today.  Will discuss with hospitalist for admission.  I did discuss the case with radiology and the hospitalist, the patient does have a segmental pulmonary embolism, thankfully he is already anticoagulated on Xarelto.  Final Clinical Impression(s) / ED Diagnoses Final diagnoses:  Cellulitis of left arm  Acute deep vein thrombosis (DVT) of proximal vein of right lower extremity (HCC)     Noemi Chapel, MD 03/27/2021 [phone]

## 2021-03-30 NOTE — ED Notes (Signed)
Back from CT, admits to hurting all over.

## 2021-03-30 NOTE — ED Notes (Signed)
Pt to CT, alert, NAD, calm, intermittent moaning, denies pain

## 2021-03-30 NOTE — ED Triage Notes (Signed)
Pt BIB [facility] from [facility] for a positive RLE DVT. Facility noted a DVT during an US exam this am. Pt w/recent Right Hip surgery approx 1 month ago. Also reports cellulitis to Left arm, skin tears noted to Right arm/elbow.   A/O x3, per staff this is not his baseline. Pt with an indwelling foley, unsure if it was placed during recent hip replacement. Staff also reports acute onset of "respiratory Failure" as pt was originally RA and is now today requiring 2L [place]. Pt sats on RA 86% increased to 96% w/2 L [place]    BP 90/60 HR 72 RR 16 CBG 131  18g RAC  Received 500cc NS, BP increased 102/68

## 2021-03-30 NOTE — ED Notes (Signed)
daughter arrives to [facility].

## 2021-03-31 ENCOUNTER — Inpatient Hospital Stay (HOSPITAL_COMMUNITY): Payer: Medicare Other

## 2021-03-31 DIAGNOSIS — I959 Hypotension, unspecified: Secondary | ICD-10-CM

## 2021-03-31 DIAGNOSIS — I2609 Other pulmonary embolism with acute cor pulmonale: Secondary | ICD-10-CM | POA: Diagnosis not present

## 2021-03-31 DIAGNOSIS — J9601 Acute respiratory failure with hypoxia: Secondary | ICD-10-CM

## 2021-03-31 DIAGNOSIS — L03114 Cellulitis of left upper limb: Secondary | ICD-10-CM | POA: Diagnosis not present

## 2021-03-31 LAB — BLOOD CULTURE ID PANEL (REFLEXED) - BCID2

## 2021-03-31 LAB — ECHOCARDIOGRAM COMPLETE
Area-P 1/2: 5.27 cm2
Height: 67 in
P 1/2 time: 428 msec
Radius: 0.4 cm
S' Lateral: 2.3 cm
Weight: 2786.61 oz

## 2021-03-31 LAB — IRON AND TIBC
Iron: 22 ug/dL — ABNORMAL LOW (ref 45–182)
Saturation Ratios: 8 % — ABNORMAL LOW (ref 17.9–39.5)
TIBC: 272 ug/dL (ref 250–450)
UIBC: 250 ug/dL

## 2021-03-31 LAB — CBC
HCT: 22.9 % — ABNORMAL LOW (ref 39.0–52.0)
Hemoglobin: 7.2 g/dL — ABNORMAL LOW (ref 13.0–17.0)
MCH: 32 pg (ref 26.0–34.0)
MCHC: 31.4 g/dL (ref 30.0–36.0)
MCV: 101.8 fL — ABNORMAL HIGH (ref 80.0–100.0)
Platelets: 196 10*3/uL (ref 150–400)
RBC: 2.25 MIL/uL — ABNORMAL LOW (ref 4.22–5.81)
RDW: 17.3 % — ABNORMAL HIGH (ref 11.5–15.5)
WBC: 13 10*3/uL — ABNORMAL HIGH (ref 4.0–10.5)
nRBC: 0 % (ref 0.0–0.2)

## 2021-03-31 LAB — BASIC METABOLIC PANEL
Anion gap: 7 (ref 5–15)
BUN: 21 mg/dL (ref 8–23)
CO2: 22 mmol/L (ref 22–32)
Calcium: 8.1 mg/dL — ABNORMAL LOW (ref 8.9–10.3)
Chloride: 99 mmol/L (ref 98–111)
Creatinine, Ser: 1.13 mg/dL (ref 0.61–1.24)
GFR, Estimated: >60 mL/min (ref >60)
Glucose, Bld: 148 mg/dL — ABNORMAL HIGH (ref 70–99)
Potassium: 3.9 mmol/L (ref 3.5–5.1)
Sodium: 128 mmol/L — ABNORMAL LOW (ref 135–145)

## 2021-03-31 LAB — PREPARE RBC (CROSSMATCH)

## 2021-03-31 LAB — HEMOGLOBIN AND HEMATOCRIT, BLOOD
HCT: 27.5 % — ABNORMAL LOW (ref 39.0–52.0)
Hemoglobin: 8.9 g/dL — ABNORMAL LOW (ref 13.0–17.0)

## 2021-03-31 LAB — APTT: aPTT: 134 seconds — ABNORMAL HIGH (ref 24–36)

## 2021-03-31 LAB — FERRITIN: Ferritin: 244 ng/mL (ref 24–336)

## 2021-03-31 LAB — ABO/RH: ABO/RH(D): O POS

## 2021-03-31 LAB — LACTIC ACID, PLASMA
Lactic Acid, Venous: 3.6 mmol/L (ref 0.5–1.9)
Lactic Acid, Venous: 2.4 mmol/L (ref 0.5–1.9)

## 2021-03-31 LAB — SURGICAL PCR SCREEN
MRSA, PCR: NEGATIVE
Staphylococcus aureus: NEGATIVE

## 2021-03-31 LAB — HEPARIN LEVEL (UNFRACTIONATED): Heparin Unfractionated: 0.59 IU/mL (ref 0.30–0.70)

## 2021-03-31 MED ORDER — SODIUM CHLORIDE 0.9 % IV BOLUS
500.0000 mL | Freq: Once | INTRAVENOUS | Status: AC
  Administered 2021-03-31: 500 mL via INTRAVENOUS

## 2021-03-31 MED ORDER — HEPARIN (PORCINE) 25000 UT/250ML-% IV SOLN
1000.0000 [IU]/h | INTRAVENOUS | Status: DC
  Administered 2021-03-31: 1100 [IU]/h via INTRAVENOUS
  Administered 2021-04-01 – 2021-04-02 (×2): 1000 [IU]/h via INTRAVENOUS
  Filled 2021-03-31 (×3): qty 250

## 2021-03-31 MED ORDER — SODIUM CHLORIDE 0.9 % IV SOLN
2.0000 g | Freq: Two times a day (BID) | INTRAVENOUS | Status: AC
  Administered 2021-03-31 – 2021-04-06 (×14): 2 g via INTRAVENOUS
  Filled 2021-03-31 (×14): qty 2

## 2021-03-31 MED ORDER — SODIUM CHLORIDE 0.9 % IV SOLN: INTRAVENOUS | Status: DC

## 2021-03-31 MED ORDER — PERFLUTREN LIPID MICROSPHERE
1.0000 mL | INTRAVENOUS | Status: AC | PRN
  Administered 2021-03-31: 1.5 mL via INTRAVENOUS
  Filled 2021-03-31: qty 10

## 2021-03-31 MED ORDER — PANTOPRAZOLE SODIUM 40 MG IV SOLR
40.0000 mg | INTRAVENOUS | Status: DC
  Administered 2021-03-31 – 2021-04-03 (×4): 40 mg via INTRAVENOUS
  Filled 2021-03-31 (×4): qty 40

## 2021-03-31 MED ORDER — SODIUM CHLORIDE 0.9% IV SOLUTION: Freq: Once | INTRAVENOUS | Status: AC

## 2021-03-31 MED ORDER — CHLORHEXIDINE GLUCONATE CLOTH 2 % EX PADS
6.0000 | MEDICATED_PAD | Freq: Every day | CUTANEOUS | Status: DC
  Administered 2021-03-31 – 2021-04-08 (×8): 6 via TOPICAL

## 2021-03-31 MED ORDER — LACTATED RINGERS IV BOLUS
1000.0000 mL | Freq: Once | INTRAVENOUS | Status: AC
  Administered 2021-03-31: 1000 mL via INTRAVENOUS

## 2021-03-31 NOTE — Progress Notes (Signed)
Patient ID: [name], male   DOB: January 08, 1931, 87 y.o.   MRN: [id number]   LOS: 1 day   Subjective: Doing well from hip standpoint. Denies excess pain, working well with rehab.   Objective: Vital signs in last 24 hours: Temp:  [97.6 F (36.4 C)-98.1 F (36.7 C)] 97.8 F (36.6 C) (07/14 1147) Pulse Rate:  [60-91] 91 (07/14 1200) Resp:  [12-23] 20 (07/14 1200) BP: (75-142)/(47-117) 81/71 (07/14 1200) SpO2:  [90 %-100 %] 96 % (07/14 1200) Weight:  [79 kg] 79 kg (07/13 2204) Last BM Date: 03/31/21   Laboratory  CBC Recent Labs    03/28/2021 1255 03/31/21 0057  WBC 18.0* 13.0*  HGB 9.1* 7.2*  HCT 28.8* 22.9*  PLT 231 196   BMET Recent Labs    03/21/2021 1255 03/31/21 0057  NA 131* 128*  K 4.2 3.9  CL 100 99  CO2 24 22  GLUCOSE 126* 148*  BUN 20 21  CREATININE 1.15 1.13  CALCIUM 8.2* 8.1*     Physical Exam General appearance: alert and no distress Right hip: Incisions intact, staples in place, mild TTP superiorly, extensive ecchymosis, no surrounding erythema.   Assessment/Plan: S/p right IMN hip -- Continue staples for now. Dry dressing prn. Continue PT/OT.    Lisette Abu, PA-C Orthopedic Surgery [phone] 03/31/2021

## 2021-03-31 NOTE — Consult Note (Signed)
NAME:  [name], MRN:  [id number], DOB:  [birth year], LOS: 1 ADMISSION DATE:  04/12/2021, CONSULTATION DATE:  7/14 REFERRING MD:  Dr. Tyrell Antonio, CHIEF COMPLAINT:  hypotension; PE   History of Present Illness:  Patient is a 87 yo M w/ PMH of R hip fracture post ORIF 1 week ago, chronic foley catheter, myasthenia gravis on chronic steroid, HTN, BPH, dementia presents to [facility] on 7/13 with hypotension.  Patient was discharged from [facility] 1 week ago after ORIF of right hip. Discharged on aspirin and plavix. Patient developed right leg swelling and pain around 7/9. On 7/12, DVT ultrasound positive for fight leg DVT. Started on Xarelto.  On 7/14, patient was hypotensive and in respiratory distress at his rehab facility and was admitted to [facility]. Denies cough, fever, chills.  ED course: BP borderline low, hypoxia resolved on 2 l/m, CTA positive for LLL subsegmental PE. WBC 18, Na 126.  PCCM consulted on 7/14 to evaluate management for hypotension and hypoxemia.  Pertinent  Medical History   Past Medical History:  Diagnosis Date   Carotid artery disease ([place])    Coronary artery disease    PCI RCA 2009   Dyslipidemia    Hypertension    Scleritis, unspecified    treated with prednisone   Seizure ([place])    felt due to cyclosporin in setting of electrolyte disorder   Solitary kidney    Symptomatic bradycardia    a. s/p STJ dual chamber pacemaker     Significant Hospital Events: Including procedures, antibiotic start and stop dates in addition to other pertinent events   7/13: patient admitted to [facility] for hypotension and PE 7/14: PCCM consulted for hypotension. Responding to IV fluids.   Interim History / Subjective:  Patient alert and has no complaints Denies SOB; On 2 l/m [place] w/ sats 92%; patient never smoker and denies wearing O2 at home BP stable at 92/64. IV fluids going. On stress dose steroids. WBC trending down. Afebrile On IV heparin  Objective   Blood pressure (!) 81/71, pulse  91, temperature 97.8 F (36.6 C), resp. rate 20, height 5\' 7"  (1.702 m), weight 79 kg, SpO2 96 %.        Intake/Output Summary (Last 24 hours) at 03/31/2021 1230 Last data filed at 03/31/2021 1147 Gross per 24 hour  Intake 569 ml  Output 2125 ml  Net -1556 ml   Filed Weights   03/28/2021 1229 04/01/2021 2204  Weight: 73.5 kg 79 kg    Examination: General:  NAD HEENT: MM pink/moist; Thorp in place Neuro: AOx3 CV: s1s2, RRR, no m/r/g PULM:  crackles left lung; dim clear bs on right; on 2 liters [place] sats 92% GI: soft, bsx4 active    Resolved Hospital Problem list     Assessment & Plan:   Sepsis Hypotension of unclear etiology and significance P: -continue doxy; adding cefepime for coverage of positive Blood culture -follow Bcx2 sensitivities; UA and urine culture pending -Lactic acid ordered -continue stress dose steroids -continue IV bolus; seems to be responding to fluids -consider pressors if hypotension continues to decline -echo pending  Acute PE: CTA 7/13 occlusive PE LLL with no signs of heart strain; Echo DVT: started on Xarelto 3 days prior to admission P: -Xarelto stopped; started on Heparin today -echo pending  Acute hypoxic resp failure: CT chest 7/13: shows chronic interstitial lung disease P: -stat CXR ordered -on 2 l/m [place]; wean o2 for sats >90% -IS; flutter; pulm toiletry -PT/OT and OOB when more  stable  Anemia P: -type and screen performed -will transfuse 1 unit -trend cbc  HTN P: -continue to hold home meds  MG, chronic steroid use, hyponatremia, recent orif P: -per primary  PCCM available as needed   Best Practice (right click and "Reselect all SmartList Selections" daily)   Diet/type: clear liquids DVT prophylaxis: systemic heparin GI prophylaxis: N/A Lines: N/A Foley:  Yes, and it is still needed Code Status:  full code Last date of multidisciplinary goals of care discussion [per primary]  Labs   CBC: Recent Labs  Lab  03/18/2021 1255 03/31/21 0057  WBC 18.0* 13.0*  NEUTROABS 16.8*  --   HGB 9.1* 7.2*  HCT 28.8* 22.9*  MCV 104.0* 101.8*  PLT 231 053    Basic Metabolic Panel: Recent Labs  Lab 03/27/2021 1255 03/31/21 0057  NA 131* 128*  K 4.2 3.9  CL 100 99  CO2 24 22  GLUCOSE 126* 148*  BUN 20 21  CREATININE 1.15 1.13  CALCIUM 8.2* 8.1*   GFR: Estimated Creatinine Clearance: 42.2 mL/min (by C-G formula based on SCr of 1.13 mg/dL). Recent Labs  Lab 03/26/2021 1255 03/31/21 0057  WBC 18.0* 13.0*  LATICACIDVEN 1.2  --     Liver Function Tests: Recent Labs  Lab 03/26/2021 1255  AST 20  ALT 15  ALKPHOS 92  BILITOT 1.3*  PROT 5.6*  ALBUMIN 2.5*   No results for input(s): LIPASE, AMYLASE in the last 168 hours. No results for input(s): AMMONIA in the last 168 hours.  ABG    Component Value Date/Time   HCO3 27.6 08/26/2020 2208   TCO2 29 08/26/2020 2208   O2SAT 99.0 08/26/2020 2208     Coagulation Profile: Recent Labs  Lab 04/05/2021 1255  INR 2.6*    Cardiac Enzymes: No results for input(s): CKTOTAL, CKMB, CKMBINDEX, TROPONINI in the last 168 hours.  HbA1C: Hgb A1c MFr Bld  Date/Time Value Ref Range Status  03/19/2021 03:34 AM 5.9 (H) 4.8 - 5.6 % Final    Comment:    (NOTE) Pre diabetes:          5.7%-6.4%  Diabetes:              >6.4%  Glycemic control for   <7.0% adults with diabetes   05/13/2020 06:44 AM 6.1 (H) 4.8 - 5.6 % Final    Comment:    (NOTE) Pre diabetes:          5.7%-6.4%  Diabetes:              >6.4%  Glycemic control for   <7.0% adults with diabetes     CBG: No results for input(s): GLUCAP in the last 168 hours.  Review of Systems:   See hpi  Past Medical History:  He,  has a past medical history of Carotid artery disease ([place]), Coronary artery disease, Dyslipidemia, Hypertension, Scleritis, unspecified, Seizure ([place]), Solitary kidney, and Symptomatic bradycardia.   Surgical History:   Past Surgical History:  Procedure Laterality  Date   CARDIAC CATHETERIZATION     12 years ago?   COLONOSCOPY     EYE SURGERY     right eye about 10 years ago   INTRAMEDULLARY (IM) NAIL INTERTROCHANTERIC Right 03/19/2021   Procedure: INTRAMEDULLARY (IM) NAIL ANTERIOR TROCHANTRIC;  Surgeon: Nicholes Stairs, MD;  Location: [place];  Service: Orthopedics;  Laterality: Right;   ORIF HUMERUS FRACTURE Right 07/03/2019   Procedure: Right distal humerus open reduction, internal fixation;  Surgeon: Avanell Shackleton III,  MD;  Location: [place];  Service: Orthopedics;  Laterality: Right;   PACEMAKER INSERTION  2009   STJ dual chamber pacemaker implanted by Dr Olevia Perches for symptomatic bradycardia     Social History:   reports that he has never smoked. He has never used smokeless tobacco. He reports current alcohol use. He reports that he does not use drugs.   Family History:  His family history includes Alcohol abuse in his son; Arthritis/Rheumatoid in his mother; COPD in his father; Heart disease in his mother.   Allergies Allergies  Allergen Reactions   Cefuroxime Axetil Other (See Comments)    Reaction not recalled   Cyclosporine Other (See Comments)    Reaction not recalled   Elemental Sulfur Other (See Comments)    No energy, could not walk    Maxzide [Triamterene-Hctz] Other (See Comments)    Adverse reaction- bloating and abdominal pain   Sulfa Antibiotics Other (See Comments)    Could not sleep/eat and experienced gastric pain   Sulfamethoxazole Other (See Comments)    Could not sleep/eat and had gastric pain   Sulfamethoxazole-Trimethoprim Other (See Comments)    Gastric pain and could not eat/sleep   Prednisone Other (See Comments)    Can tolerate for a short period of time     Home Medications  Prior to Admission medications   Medication Sig Start Date End Date Taking? Authorizing Provider  acetaminophen (TYLENOL) 325 MG tablet Take 650 mg by mouth every 6 (six) hours as needed for mild pain or fever.   Yes [provider]  amLODipine (NORVASC) 2.5 MG tablet Take 2.5 mg by mouth every morning.   Yes [provider]  carvedilol (COREG) 3.125 MG tablet Take 1 tablet (3.125 mg total) by mouth 2 (two) times daily. 03/23/21  Yes Shelly Coss, MD  Dextromethorphan-guaiFENesin 10-100 MG/5ML liquid Take 10 mLs by mouth every 4 (four) hours as needed (cough/congestion).   Yes [provider]  docusate sodium (COLACE) 100 MG capsule Take 1 capsule (100 mg total) by mouth 2 (two) times daily. 03/23/21  Yes Shelly Coss, MD  finasteride (PROSCAR) 5 MG tablet Take 5 mg by mouth every morning.   Yes [provider]  LORazepam (ATIVAN) 0.5 MG tablet Take 1 tablet (0.5 mg total) by mouth at bedtime as needed (insomnia). 03/22/21  Yes Shelly Coss, MD  methocarbamol (ROBAXIN) 500 MG tablet Take 1 tablet (500 mg total) by mouth every 6 (six) hours as needed for muscle spasms. 03/23/21  Yes Shelly Coss, MD  Neomycin-Bacitracin-Polymyxin (TRIPLE ANTIBIOTIC) OINT Apply 1 application topically See admin instructions. Apply small amount of triple antibiotic ointment to non-stick pad and cover wound, wrap with coban until healed   Yes [provider]  NITROSTAT 0.4 MG SL tablet DISSOLVE ONE TABLET UNDER THE TONGUE EVERY 5 MINUTES AS NEEDED FOR CHEST PAIN.  DO NOT EXCEED A TOTAL OF 3 DOSES IN 15 MINUTES Patient taking differently: Place 0.4 mg under the tongue every 5 (five) minutes x 3 doses as needed for chest pain. 11/19/18  Yes Fay Records, MD  ondansetron (ZOFRAN) 4 MG tablet Take 4 mg by mouth every 8 (eight) hours as needed for nausea or vomiting.   Yes [provider]  oxyCODONE (OXY IR/ROXICODONE) 5 MG immediate release tablet Take 1 tablet (5 mg total) by mouth every 6 (six) hours as needed for severe pain. 03/22/21  Yes Shelly Coss, MD  predniSONE (DELTASONE) 20 MG tablet Take 1 tablet (20 mg  total) by mouth daily with breakfast. 08/16/20  Yes Patel, Donika K, DO   pyridostigmine (MESTINON) 60 MG tablet Take 1 tablet at 7am, 10am 1pm, and 5pm Patient taking differently: Take 60 mg by mouth See admin instructions. Take one tablet (60 mg) by mouth four times daily - 6:30am, 10am, 1pm and 5pm 07/16/20  Yes Patel, Donika K, DO  risperiDONE (RISPERDAL) 0.5 MG tablet Take 1 tablet (0.5 mg total) by mouth at bedtime as needed (agitation). 03/23/21  Yes Shelly Coss, MD  tamsulosin (FLOMAX) 0.4 MG CAPS capsule Take 1 capsule (0.4 mg total) by mouth daily. 03/23/21  Yes Adhikari, Tamsen Meek, MD  XARELTO 15 MG TABS tablet Take 15 mg by mouth 2 (two) times daily. 03/29/21 04/19/21 Yes [provider]  Ensure (ENSURE) Take 237 mLs by mouth daily as needed (meal refusal).    [provider]  Nutritional Supplements (NUTRITIONAL SUPPLEMENT PO) Take 1 Can by mouth 2 (two) times daily. Muscle Milk (provided by family)    [provider]     Critical care time: 72    JD Rexene Agent [facility] 03/31/2021, 12:30 PM  Please see [email or web address] for pager details.  From 7A-7P if no response, please call [phone]. After hours, please call ELink [phone].

## 2021-03-31 NOTE — Progress Notes (Signed)
PT Cancellation Note  Patient Details Name: [name] MRN: [id number] DOB: Jan 28, 1931   Cancelled Treatment:    Reason Eval/Treat Not Completed: Patient not medically ready. Pt with new DVT/PE and heparin just initiated. Per protocol with anticoagulation just started will defer eval until tomorrow.    [name] [facility] 03/31/2021, 9:11 AM [place] Pager [phone] Office [phone]

## 2021-03-31 NOTE — Progress Notes (Addendum)
PROGRESS NOTE    [name]  UDJ:[id number] DOB: March 12, 1931 DOA: 03/23/2021 PCP: Shirline Frees, MD   Brief Narrative: 87 year old with past medical history significant for right hip fracture status post ORIF with intramedullary nailing, urinary retention status post Foley catheter, myasthenia gravis on chronic Steroids, hypertension, BPH, dementia, essential tremor presents with hypotension.  Patient was recently discharged from the hospital 1 week ago after ORIF of the right hip.  Patient over the last 2 or 3 days developed right leg swelling and pain.  Doppler lower extremity was positive for DVT.  Patient was a started on Xarelto overnight.  The morning of admission patient was found to be in respiratory distress and an low blood pressure.  Patient was referred to the ED for further evaluation.  Patient was noted to have a rash on his left elbow.  Evaluation in the ED patient was noted to have borderline low blood pressure, hypoxia.  CTA positive for left lower lobe subsegmental PE.    Assessment & Plan:   Active Problems:   Pulmonary emboli (HCC)   1-Subsegmental left lower lobe PE, DVT, Acute Hypoxic Respiratory Failure.  -Plan to start heparin drip, and monitor hemoglobin. -Transition to Xarelto, when hemoglobin and blood pressure stable. -SBP soft this am, will consult CCM.   2-Anemia;  Hb down to 7.6 Plan to transfuse one unit PRBC.  Check Occult blood.  No melena.  Monitor.  Prior HD; 7.9 July 5.    3-Sepsis, 1/2 Blood culture positive for Enterobacter Cloaceae.  Started on Cefepime.  Presents with leukocytosis, hypotension, left elbow cellulitis.  On IV steroid.  On antibiotics, Doxy.  BP low again. CCM consulted. IV bolus ordered.   Chronic steroid use;  On chronic prednisone.  Started IV hydrocortisone.   Hyponatremia:  Change diet to regular.  Received IV fluids.  Monitor.   Recent ORIF, Right Hip:  Ortho : Dr Victorino December.  Spoke with ortho,  they will check on patient.   Chronic Indwelling Foley catheter:  Needs to follow up with urology.     Estimated body mass index is 27.28 kg/m as calculated from the following:   Height as of this encounter: 5\' 7"  (1.702 m).   Weight as of this encounter: 79 kg.   DVT prophylaxis: Heparin gtt Code Status: Full code Family Communication: Daughter updated.  Disposition Plan:  Status is: Inpatient  Remains inpatient appropriate because:Hemodynamically unstable  Dispo: The patient is from: SNF              Anticipated d/c is to: SNF              Patient currently is not medically stable to d/c.   Difficult to place patient No        Consultants:  None  Procedures:  ECHO P  Antimicrobials:    Subjective: He is alert, denies chest pain . He report breathing better today.  He had BM, no blood notice.    Objective: Vitals:   04/03/2021 2204 04/11/2021 2332 03/31/21 0400 03/31/21 0432  BP: (!) 142/84 125/80 (!) 93/59 (!) 104/57  Pulse: 84 82 65 60  Resp: 20 (!) 23 12 15   Temp: 97.6 F (36.4 C)  98.1 F (36.7 C)   TempSrc: Oral  Oral   SpO2: 98% 100% 92% 100%  Weight: 79 kg     Height: 5\' 7"  (1.702 m)       Intake/Output Summary (Last 24 hours) at 03/31/2021 5885 Last data filed at  03/31/2021 0624 Gross per 24 hour  Intake 569 ml  Output 1325 ml  Net -756 ml   Filed Weights   04/04/2021 1229 03/20/2021 2204  Weight: 73.5 kg 79 kg    Examination:  General exam: Appears calm and comfortable  Respiratory system: Clear to auscultation. Respiratory effort normal. Cardiovascular system: S1 & S2 heard, RRR.  Gastrointestinal system: Abdomen is nondistended, soft and nontender. No organomegaly or masses felt. Normal bowel sounds heard. Central nervous system: Alert and  Extremities: Symmetric 5 x 5 power. Dressing right hip, bruises.     Data Reviewed: I have personally reviewed following labs and imaging studies  CBC: Recent Labs  Lab 04/14/2021 1255  03/31/21 0057  WBC 18.0* 13.0*  NEUTROABS 16.8*  --   HGB 9.1* 7.2*  HCT 28.8* 22.9*  MCV 104.0* 101.8*  PLT 231 740   Basic Metabolic Panel: Recent Labs  Lab 03/29/2021 1255 03/31/21 0057  NA 131* 128*  K 4.2 3.9  CL 100 99  CO2 24 22  GLUCOSE 126* 148*  BUN 20 21  CREATININE 1.15 1.13  CALCIUM 8.2* 8.1*   GFR: Estimated Creatinine Clearance: 42.2 mL/min (by C-G formula based on SCr of 1.13 mg/dL). Liver Function Tests: Recent Labs  Lab 04/11/2021 1255  AST 20  ALT 15  ALKPHOS 92  BILITOT 1.3*  PROT 5.6*  ALBUMIN 2.5*   No results for input(s): LIPASE, AMYLASE in the last 168 hours. No results for input(s): AMMONIA in the last 168 hours. Coagulation Profile: Recent Labs  Lab 04/04/2021 1255  INR 2.6*   Cardiac Enzymes: No results for input(s): CKTOTAL, CKMB, CKMBINDEX, TROPONINI in the last 168 hours. BNP (last 3 results) No results for input(s): PROBNP in the last 8760 hours. HbA1C: No results for input(s): HGBA1C in the last 72 hours. CBG: No results for input(s): GLUCAP in the last 168 hours. Lipid Profile: No results for input(s): CHOL, HDL, LDLCALC, TRIG, CHOLHDL, LDLDIRECT in the last 72 hours. Thyroid Function Tests: No results for input(s): TSH, T4TOTAL, FREET4, T3FREE, THYROIDAB in the last 72 hours. Anemia Panel: Recent Labs    04/04/2021 2253  FERRITIN 244  TIBC 272  IRON 22*  RETICCTPCT 7.8*   Sepsis Labs: Recent Labs  Lab 04/04/2021 1255  LATICACIDVEN 1.2    Recent Results (from the past 240 hour(s))  Resp Panel by RT-PCR (Flu A&B, Covid) Nasopharyngeal Swab     Status: None   Collection Time: 03/23/21  9:33 AM   Specimen: Nasopharyngeal Swab; Nasopharyngeal(NP) swabs in vial transport medium  Result Value Ref Range Status   SARS Coronavirus 2 by RT PCR NEGATIVE NEGATIVE Final    Comment: (NOTE) SARS-CoV-2 target nucleic acids are NOT DETECTED.  The SARS-CoV-2 RNA is generally detectable in upper respiratory specimens during the  acute phase of infection. The lowest concentration of SARS-CoV-2 viral copies this assay can detect is 138 copies/mL. A negative result does not preclude SARS-Cov-2 infection and should not be used as the sole basis for treatment or other patient management decisions. A negative result may occur with  improper specimen collection/handling, submission of specimen other than nasopharyngeal swab, presence of viral mutation(s) within the areas targeted by this assay, and inadequate number of viral copies(<138 copies/mL). A negative result must be combined with clinical observations, patient history, and epidemiological information. The expected result is Negative.  Fact Sheet for Patients:  EntrepreneurPulse.com.au  Fact Sheet for Healthcare Providers:  IncredibleEmployment.be  This test is no t yet approved or cleared  by the [place] and  has been authorized for detection and/or diagnosis of SARS-CoV-2 by FDA under an Emergency Use Authorization (EUA). This EUA will remain  in effect (meaning this test can be used) for the duration of the COVID-19 declaration under Section 564(b)(1) of the Act, 21 U.S.C.section 360bbb-3(b)(1), unless the authorization is terminated  or revoked sooner.       Influenza A by PCR NEGATIVE NEGATIVE Final   Influenza B by PCR NEGATIVE NEGATIVE Final    Comment: (NOTE) The Xpert Xpress SARS-CoV-2/FLU/RSV plus assay is intended as an aid in the diagnosis of influenza from Nasopharyngeal swab specimens and should not be used as a sole basis for treatment. Nasal washings and aspirates are unacceptable for Xpert Xpress SARS-CoV-2/FLU/RSV testing.  Fact Sheet for Patients: EntrepreneurPulse.com.au  Fact Sheet for Healthcare Providers: IncredibleEmployment.be  This test is not yet approved or cleared by the [place] FDA and has been authorized for detection and/or  diagnosis of SARS-CoV-2 by FDA under an Emergency Use Authorization (EUA). This EUA will remain in effect (meaning this test can be used) for the duration of the COVID-19 declaration under Section 564(b)(1) of the Act, 21 U.S.C. section 360bbb-3(b)(1), unless the authorization is terminated or revoked.  Performed at [facility], [place] [address], [place], [place] [address]   Resp Panel by RT-PCR (Flu A&B, Covid) Nasopharyngeal Swab     Status: None   Collection Time: 04/14/2021  1:00 PM   Specimen: Nasopharyngeal Swab; Nasopharyngeal(NP) swabs in vial transport medium  Result Value Ref Range Status   SARS Coronavirus 2 by RT PCR NEGATIVE NEGATIVE Final    Comment: (NOTE) SARS-CoV-2 target nucleic acids are NOT DETECTED.  The SARS-CoV-2 RNA is generally detectable in upper respiratory specimens during the acute phase of infection. The lowest concentration of SARS-CoV-2 viral copies this assay can detect is 138 copies/mL. A negative result does not preclude SARS-Cov-2 infection and should not be used as the sole basis for treatment or other patient management decisions. A negative result may occur with  improper specimen collection/handling, submission of specimen other than nasopharyngeal swab, presence of viral mutation(s) within the areas targeted by this assay, and inadequate number of viral copies(<138 copies/mL). A negative result must be combined with clinical observations, patient history, and epidemiological information. The expected result is Negative.  Fact Sheet for Patients:  EntrepreneurPulse.com.au  Fact Sheet for Healthcare Providers:  IncredibleEmployment.be  This test is no t yet approved or cleared by the [place] FDA and  has been authorized for detection and/or diagnosis of SARS-CoV-2 by FDA under an Emergency Use Authorization (EUA). This EUA will remain  in effect (meaning this test can be used) for the duration  of the COVID-19 declaration under Section 564(b)(1) of the Act, 21 U.S.C.section 360bbb-3(b)(1), unless the authorization is terminated  or revoked sooner.       Influenza A by PCR NEGATIVE NEGATIVE Final   Influenza B by PCR NEGATIVE NEGATIVE Final    Comment: (NOTE) The Xpert Xpress SARS-CoV-2/FLU/RSV plus assay is intended as an aid in the diagnosis of influenza from Nasopharyngeal swab specimens and should not be used as a sole basis for treatment. Nasal washings and aspirates are unacceptable for Xpert Xpress SARS-CoV-2/FLU/RSV testing.  Fact Sheet for Patients: EntrepreneurPulse.com.au  Fact Sheet for Healthcare Providers: IncredibleEmployment.be  This test is not yet approved or cleared by the [place] FDA and has been authorized for detection and/or diagnosis of SARS-CoV-2 by FDA under an Emergency  Use Authorization (EUA). This EUA will remain in effect (meaning this test can be used) for the duration of the COVID-19 declaration under Section 564(b)(1) of the Act, 21 U.S.C. section 360bbb-3(b)(1), unless the authorization is terminated or revoked.  Performed at [facility], [place] [address] [place], [place] [address]   Surgical pcr screen     Status: None   Collection Time: 03/23/2021  5:23 PM   Specimen: Nasal Mucosa; Nasal Swab  Result Value Ref Range Status   MRSA, PCR NEGATIVE NEGATIVE Final   Staphylococcus aureus NEGATIVE NEGATIVE Final    Comment: (NOTE) The Xpert SA Assay (FDA approved for NASAL specimens in patients 13 years of age and older), is one component of a comprehensive surveillance program. It is not intended to diagnose infection nor to guide or monitor treatment. Performed at [facility], [place] [address] [place], [place] [address]          Radiology Studies: DG Chest 2 View  Result Date: 04/13/2021 CLINICAL DATA:  Sepsis EXAM: CHEST - 2 VIEW COMPARISON:  03/18/2021 FINDINGS:  Bilateral diffuse interstitial thickening consistent with chronic interstitial lung disease. No focal consolidation, pleural effusion or pneumothorax. Stable cardiomegaly. Thoracic aortic atherosclerosis. Dual lead cardiac pacemaker. No acute osseous abnormality. IMPRESSION: No acute cardiopulmonary disease. Chronic interstitial lung disease. Electronically Signed   By: Kathreen Devoid   On: 04/04/2021 13:08   DG Elbow 2 Views Left  Result Date: 03/29/2021 CLINICAL DATA:  Left elbow swelling. EXAM: LEFT ELBOW - 2 VIEW COMPARISON:  None. FINDINGS: Diffuse soft tissue swelling is present in the left upper extremity both above and below the elbow. This is most significant posterior to the humerus. No underlying fracture or effusion is present. IMPRESSION: Diffuse soft tissue swelling without underlying fracture or effusion. Question cellulitis or obstructive edema. Electronically Signed   By: San Morelle M.D.   On: 04/11/2021 18:09   CT Angio Chest PE W and/or Wo Contrast  Result Date: 04/10/2021 CLINICAL DATA:  Positive for right lower extremity DVT. Right hip surgery 1 month ago. EXAM: CT ANGIOGRAPHY CHEST WITH CONTRAST TECHNIQUE: Multidetector CT imaging of the chest was performed using the standard protocol during bolus administration of intravenous contrast. Multiplanar CT image reconstructions and MIPs were obtained to evaluate the vascular anatomy. CONTRAST:  154mL OMNIPAQUE IOHEXOL 350 MG/ML SOLN COMPARISON:  10/15/2008 FINDINGS: Cardiovascular: Satisfactory opacification of the pulmonary arteries to the segmental level. Occlusive pulmonary embolus involving a segmental branch of the left lower lobe. Stable cardiomegaly. No pericardial effusion. Thoracic aortic atherosclerosis. Multi vessel coronary artery atherosclerosis. Mediastinum/Nodes: No enlarged mediastinal, hilar, or axillary lymph nodes. Thyroid gland, trachea, and esophagus demonstrate no significant findings. Lungs/Pleura: Bilateral  chronic interstitial lung disease. 7.4 mm right lower lobe bulla. Upper Abdomen: No acute upper abdominal abnormality. Musculoskeletal: No acute osseous abnormality. No aggressive osseous lesion. Mild thoracic spine spondylosis. Review of the MIP images confirms the above findings. IMPRESSION: 1. Occlusive pulmonary embolus involving a segmental branch of the left lower lobe. 2. Chronic interstitial lung disease. 3.  Aortic Atherosclerosis (ICD10-I70.0). 4. Multi vessel coronary artery atherosclerosis. Electronically Signed   By: Kathreen Devoid   On: 04/04/2021 15:44        Scheduled Meds:  Chlorhexidine Gluconate Cloth  6 each Topical Daily   docusate sodium  100 mg Oral BID   doxycycline  100 mg Oral Q12H   finasteride  5 mg Oral q morning   hydrocortisone sod succinate (SOLU-CORTEF) inj  50 mg  Intravenous Q6H   neomycin-bacitracin-polymyxin   Topical See admin instructions   pyridostigmine  60 mg Oral 4 times per day   tamsulosin  0.4 mg Oral Daily   Continuous Infusions:  albumin human 25 g (03/31/21 0455)     LOS: 1 day    Time spent: 35 minutes,.     Elmarie Shiley, MD [facility]   If 7PM-7AM, please contact night-coverage [email or web address]  03/31/2021, 7:06 AM

## 2021-03-31 NOTE — Progress Notes (Signed)
ANTICOAGULATION CONSULT NOTE - Initial Consult  Pharmacy Consult for Heparin  Indication: pulmonary embolus and DVT  Allergies  Allergen Reactions   Cefuroxime Axetil Other (See Comments)    Reaction not recalled   Cyclosporine Other (See Comments)    Reaction not recalled   Elemental Sulfur Other (See Comments)    No energy, could not walk    Maxzide [Triamterene-Hctz] Other (See Comments)    Adverse reaction- bloating and abdominal pain   Sulfa Antibiotics Other (See Comments)    Could not sleep/eat and experienced gastric pain   Sulfamethoxazole Other (See Comments)    Could not sleep/eat and had gastric pain   Sulfamethoxazole-Trimethoprim Other (See Comments)    Gastric pain and could not eat/sleep   Prednisone Other (See Comments)    Can tolerate for a short period of time    Patient Measurements: Height: 5\' 7"  (170.2 cm) Weight: 79 kg (174 lb 2.6 oz) IBW/kg (Calculated) : 66.1  Vital Signs: Temp: 98.1 F (36.7 C) (07/14 0400) Temp Source: Oral (07/14 0400) BP: 104/57 (07/14 0432) Pulse Rate: 60 (07/14 0432)  Labs: Recent Labs    03/18/2021 1255 03/31/21 0057  HGB 9.1* 7.2*  HCT 28.8* 22.9*  PLT 231 196  LABPROT 27.9*  --   INR 2.6*  --   CREATININE 1.15 1.13    Estimated Creatinine Clearance: 42.2 mL/min (by C-G formula based on SCr of 1.13 mg/dL).   Medical History: Past Medical History:  Diagnosis Date   Carotid artery disease ([place])    Coronary artery disease    PCI RCA 2009   Dyslipidemia    Hypertension    Scleritis, unspecified    treated with prednisone   Seizure ([place])    felt due to cyclosporin in setting of electrolyte disorder   Solitary kidney    Symptomatic bradycardia    a. s/p STJ dual chamber pacemaker    Assessment: 87 y/o M came from a nursing facility yesterday with a reported new onset DVT. Was supposedly started on Xarelto at the facility. It has been ~24 hours since patient has received any anti-coagulation CT angio is  also positive for PE. Starting heparin now. May need to use aPTT to dose with recent Xarelto. Hgb is 7.2. No overt bleeding noted-Dr. Tyrell Antonio is aware.   Goal of Therapy:  Heparin level 0.3-0.7 units/ml aPTT 66-102 seconds Monitor platelets by anticoagulation protocol: Yes   Plan:  Will avoid bolus with lower Hgb Start heparin drip at 1100 units/hr 1600 aPTT/heparin level Daily CBC/heparin level Monitor for bleeding  Narda Bonds, PharmD, BCPS Clinical Pharmacist Phone: [phone]

## 2021-03-31 NOTE — Progress Notes (Signed)
PHARMACY - PHYSICIAN COMMUNICATION CRITICAL VALUE ALERT - BLOOD CULTURE IDENTIFICATION (BCID)  [name] is an 87 y.o. male who presented to [facility] on 03/19/2021 with a chief complaint of DVT / PE / sepsis  Assessment:  One blood culture positive for enterobacter cloacae.  Possible source is left elbow.  Allergy to cefuroxime listed, reaction unknown.  Tolerated Cefazolin earlier this month  Name of physician (or Provider) Contacted: Dr. Tyrell Antonio  Current antibiotics: Oral doxycycline  Changes to prescribed antibiotics recommended: add cefepime Recommendations accepted by provider  Results for orders placed or performed during the hospital encounter of 04/17/2021  Blood Culture ID Panel (Reflexed) (Collected: 03/23/2021 12:55 PM)  Result Value Ref Range   Enterococcus faecalis NOT DETECTED NOT DETECTED   Enterococcus Faecium NOT DETECTED NOT DETECTED   Listeria monocytogenes NOT DETECTED NOT DETECTED   Staphylococcus species NOT DETECTED NOT DETECTED   Staphylococcus aureus (BCID) NOT DETECTED NOT DETECTED   Staphylococcus epidermidis NOT DETECTED NOT DETECTED   Staphylococcus lugdunensis NOT DETECTED NOT DETECTED   Streptococcus species NOT DETECTED NOT DETECTED   Streptococcus agalactiae NOT DETECTED NOT DETECTED   Streptococcus pneumoniae NOT DETECTED NOT DETECTED   Streptococcus pyogenes NOT DETECTED NOT DETECTED   A.calcoaceticus-baumannii NOT DETECTED NOT DETECTED   Bacteroides fragilis NOT DETECTED NOT DETECTED   Enterobacterales DETECTED (A) NOT DETECTED   Enterobacter cloacae complex DETECTED (A) NOT DETECTED   Escherichia coli NOT DETECTED NOT DETECTED   Klebsiella aerogenes NOT DETECTED NOT DETECTED   Klebsiella oxytoca NOT DETECTED NOT DETECTED   Klebsiella pneumoniae NOT DETECTED NOT DETECTED   Proteus species NOT DETECTED NOT DETECTED   Salmonella species NOT DETECTED NOT DETECTED   Serratia marcescens NOT DETECTED NOT DETECTED   Haemophilus influenzae NOT  DETECTED NOT DETECTED   Neisseria meningitidis NOT DETECTED NOT DETECTED   Pseudomonas aeruginosa NOT DETECTED NOT DETECTED   Stenotrophomonas maltophilia NOT DETECTED NOT DETECTED   Candida albicans NOT DETECTED NOT DETECTED   Candida auris NOT DETECTED NOT DETECTED   Candida glabrata NOT DETECTED NOT DETECTED   Candida krusei NOT DETECTED NOT DETECTED   Candida parapsilosis NOT DETECTED NOT DETECTED   Candida tropicalis NOT DETECTED NOT DETECTED   Cryptococcus neoformans/gattii NOT DETECTED NOT DETECTED   CTX-M ESBL NOT DETECTED NOT DETECTED   Carbapenem resistance IMP NOT DETECTED NOT DETECTED   Carbapenem resistance KPC NOT DETECTED NOT DETECTED   Carbapenem resistance NDM NOT DETECTED NOT DETECTED   Carbapenem resist OXA 48 LIKE NOT DETECTED NOT DETECTED   Carbapenem resistance VIM NOT DETECTED NOT DETECTED    [name] 03/31/2021  1:34 PM

## 2021-03-31 NOTE — Progress Notes (Signed)
ANTICOAGULATION CONSULT NOTE - Initial Consult  Pharmacy Consult for Heparin  Indication: pulmonary embolus and DVT  Allergies  Allergen Reactions   Cefuroxime Axetil Other (See Comments)    Reaction not recalled   Cyclosporine Other (See Comments)    Reaction not recalled   Elemental Sulfur Other (See Comments)    No energy, could not walk    Maxzide [Triamterene-Hctz] Other (See Comments)    Adverse reaction- bloating and abdominal pain   Sulfa Antibiotics Other (See Comments)    Could not sleep/eat and experienced gastric pain   Sulfamethoxazole Other (See Comments)    Could not sleep/eat and had gastric pain   Sulfamethoxazole-Trimethoprim Other (See Comments)    Gastric pain and could not eat/sleep   Prednisone Other (See Comments)    Can tolerate for a short period of time    Patient Measurements: Height: 5\' 7"  (170.2 cm) Weight: 79 kg (174 lb 2.6 oz) IBW/kg (Calculated) : 66.1  Vital Signs: Temp: 97.8 F (36.6 C) (07/14 1937) Temp Source: Oral (07/14 1937) BP: 123/63 (07/14 1937) Pulse Rate: 85 (07/14 1937)  Labs: Recent Labs    03/21/2021 1255 03/31/21 0057 03/31/21 1935  HGB 9.1* 7.2* 8.9*  HCT 28.8* 22.9* 27.5*  PLT 231 196  --   APTT  --   --  134*  LABPROT 27.9*  --   --   INR 2.6*  --   --   HEPARINUNFRC  --   --  0.59  CREATININE 1.15 1.13  --     Estimated Creatinine Clearance: 42.2 mL/min (by C-G formula based on SCr of 1.13 mg/dL).   Medical History: Past Medical History:  Diagnosis Date   Carotid artery disease ([place])    Coronary artery disease    PCI RCA 2009   Dyslipidemia    Hypertension    Scleritis, unspecified    treated with prednisone   Seizure ([place])    felt due to cyclosporin in setting of electrolyte disorder   Solitary kidney    Symptomatic bradycardia    a. s/p STJ dual chamber pacemaker    Assessment: 87 y/o M came from a nursing facility yesterday with a reported new onset DVT. Was supposedly started on Xarelto at  the facility. It has been ~24 hours since patient has received any anti-coagulation and CT angio is also positive for PE. Starting heparin now. May need to use aPTT to dose with recent Xarelto. Hgb is 7.2. No overt bleeding noted-Dr. Tyrell Antonio is aware.   Initial levels - aPTT prolonged at 134, but Heparin level therapeutic at 0.59 - no over bleeding or issues with infusion per RN. No baselines obtained to compare. Hgb low-stable at 8.9. Platelets within normal limits. Inclined to trust heparin level more in this case, but will reduce to prevent accumulation.   Goal of Therapy:  Heparin level 0.3-0.7 units/ml aPTT 66-102 seconds Monitor platelets by anticoagulation protocol: Yes   Plan:  Reduce heparin drip at 1000 units/hr to keep Repeat Heparin level in ~6-8 hours.  Daily CBC/heparin level Monitor for bleeding  Sloan Leiter, PharmD, BCPS, BCCCP Clinical Pharmacist Please refer to [facility] for [place] numbers 03/31/2021, 9:48 PM

## 2021-04-01 ENCOUNTER — Telehealth: Payer: Self-pay | Admitting: Neurology

## 2021-04-01 ENCOUNTER — Inpatient Hospital Stay (HOSPITAL_COMMUNITY): Payer: Medicare Other

## 2021-04-01 DIAGNOSIS — L03114 Cellulitis of left upper limb: Secondary | ICD-10-CM | POA: Diagnosis not present

## 2021-04-01 LAB — BASIC METABOLIC PANEL
Anion gap: 10 (ref 5–15)
BUN: 16 mg/dL (ref 8–23)
CO2: 19 mmol/L — ABNORMAL LOW (ref 22–32)
Calcium: 8.7 mg/dL — ABNORMAL LOW (ref 8.9–10.3)
Chloride: 105 mmol/L (ref 98–111)
Creatinine, Ser: 0.93 mg/dL (ref 0.61–1.24)
GFR, Estimated: >60 mL/min (ref >60)
Glucose, Bld: 137 mg/dL — ABNORMAL HIGH (ref 70–99)
Potassium: 3.8 mmol/L (ref 3.5–5.1)
Sodium: 134 mmol/L — ABNORMAL LOW (ref 135–145)

## 2021-04-01 LAB — URINALYSIS, MICROSCOPIC (REFLEX): WBC, UA: NONE SEEN WBC/hpf (ref 0–5)

## 2021-04-01 LAB — URINALYSIS, ROUTINE W REFLEX MICROSCOPIC
Bilirubin Urine: NEGATIVE
Glucose, UA: NEGATIVE mg/dL
Ketones, ur: NEGATIVE mg/dL
Leukocytes,Ua: NEGATIVE
Nitrite: NEGATIVE
Protein, ur: NEGATIVE mg/dL
Specific Gravity, Urine: <1.005 — ABNORMAL LOW (ref 1.005–1.030)
pH: 6 (ref 5.0–8.0)

## 2021-04-01 LAB — CBC
HCT: 26 % — ABNORMAL LOW (ref 39.0–52.0)
Hemoglobin: 8.2 g/dL — ABNORMAL LOW (ref 13.0–17.0)
MCH: 30.9 pg (ref 26.0–34.0)
MCHC: 31.5 g/dL (ref 30.0–36.0)
MCV: 98.1 fL (ref 80.0–100.0)
Platelets: 199 10*3/uL (ref 150–400)
RBC: 2.65 MIL/uL — ABNORMAL LOW (ref 4.22–5.81)
RDW: 20.2 % — ABNORMAL HIGH (ref 11.5–15.5)
WBC: 12.2 10*3/uL — ABNORMAL HIGH (ref 4.0–10.5)
nRBC: 0 % (ref 0.0–0.2)

## 2021-04-01 LAB — TYPE AND SCREEN
ABO/RH(D): O POS
Antibody Screen: NEGATIVE
Unit division: 0

## 2021-04-01 LAB — BPAM RBC
Blood Product Expiration Date: 202208142359
ISSUE DATE / TIME: 202207141337
Unit Type and Rh: 5100

## 2021-04-01 LAB — OCCULT BLOOD X 1 CARD TO LAB, STOOL: Fecal Occult Bld: NEGATIVE

## 2021-04-01 LAB — HEPARIN LEVEL (UNFRACTIONATED): Heparin Unfractionated: 0.47 IU/mL (ref 0.30–0.70)

## 2021-04-01 MED ORDER — SENNA 8.6 MG PO TABS
1.0000 | ORAL_TABLET | Freq: Every day | ORAL | Status: DC
  Administered 2021-04-02 – 2021-04-06 (×6): 8.6 mg via ORAL
  Filled 2021-04-01 (×6): qty 1

## 2021-04-01 MED ORDER — BISACODYL 10 MG RE SUPP: 10.0000 mg | Freq: Every day | RECTAL | Status: DC | PRN

## 2021-04-01 MED ORDER — ALUM & MAG HYDROXIDE-SIMETH 200-200-20 MG/5ML PO SUSP: 30.0000 mL | ORAL | Status: DC | PRN

## 2021-04-01 MED ORDER — FLEET ENEMA 7-19 GM/118ML RE ENEM
1.0000 | ENEMA | Freq: Once | RECTAL | Status: AC
  Administered 2021-04-03: 1 via RECTAL
  Filled 2021-04-01: qty 1

## 2021-04-01 MED ORDER — ALBUTEROL SULFATE (2.5 MG/3ML) 0.083% IN NEBU
2.5000 mg | INHALATION_SOLUTION | Freq: Four times a day (QID) | RESPIRATORY_TRACT | Status: DC
  Administered 2021-04-01 – 2021-04-03 (×11): 2.5 mg via RESPIRATORY_TRACT
  Filled 2021-04-01 (×12): qty 3

## 2021-04-01 MED ORDER — FUROSEMIDE 10 MG/ML IJ SOLN
20.0000 mg | Freq: Once | INTRAMUSCULAR | Status: AC
  Administered 2021-04-01: 20 mg via INTRAVENOUS
  Filled 2021-04-01: qty 2

## 2021-04-01 MED ORDER — POTASSIUM CHLORIDE CRYS ER 20 MEQ PO TBCR
40.0000 meq | EXTENDED_RELEASE_TABLET | Freq: Once | ORAL | Status: AC
  Administered 2021-04-01: 40 meq via ORAL
  Filled 2021-04-01: qty 2

## 2021-04-01 MED ORDER — FUROSEMIDE 10 MG/ML IJ SOLN
20.0000 mg | Freq: Two times a day (BID) | INTRAMUSCULAR | Status: DC
  Administered 2021-04-01 – 2021-04-03 (×4): 20 mg via INTRAVENOUS
  Filled 2021-04-01 (×4): qty 2

## 2021-04-01 MED ORDER — ALBUMIN HUMAN 25 % IV SOLN
25.0000 g | Freq: Once | INTRAVENOUS | Status: AC
  Administered 2021-04-01: 25 g via INTRAVENOUS
  Filled 2021-04-01: qty 100

## 2021-04-01 MED ORDER — BISACODYL 10 MG RE SUPP: 10.0000 mg | Freq: Once | RECTAL | Status: DC

## 2021-04-01 MED ORDER — RISPERIDONE 0.5 MG PO TABS
0.5000 mg | ORAL_TABLET | Freq: Two times a day (BID) | ORAL | Status: DC | PRN
  Administered 2021-04-01 – 2021-04-07 (×7): 0.5 mg via ORAL
  Filled 2021-04-01 (×10): qty 1

## 2021-04-01 NOTE — Care Management Important Message (Signed)
Important Message  Patient Details  Name: [name] MRN: [id number] Date of Birth: 01/11/1931   Medicare Important Message Given:  Yes     Andraya Frigon 04/01/2021, 2:10 PM

## 2021-04-01 NOTE — Telephone Encounter (Signed)
Patient's daughter [name] called and cancelled the patient's follow up appointment for 04/04/21. The patient is currently in step-down intensive care and she wants Dr. Posey Pronto to be aware. FYI. They will call back to reschedule at a later date.

## 2021-04-01 NOTE — Plan of Care (Signed)

## 2021-04-01 NOTE — Evaluation (Signed)
Physical Therapy Evaluation Patient Details Name: [name] MRN: [id number] DOB: 30-Jun-1931 Today's Date: 04/01/2021   History of Present Illness  Pt adm from SNF on 7/13 with hypoxia and hypotension. Pt found to have PE and heparin initiated 7/14. Pt also with sepsis due to lt elbow cellulitis. PMH - rt hip fx with ORIF 03/19/21,, myasthenia gravis, dementia, essential tremor,  bradycardia, solitary kidney, HTN, CAD, s/p ORIF of Rt humerus, pacer, cad, seizure  Clinical Impression  Pt admitted with above diagnosis and presents to PT with functional limitations due to deficits listed below (See PT problem list). Pt needs skilled PT to maximize independence and safety to allow discharge to SNF.      Follow Up Recommendations SNF    Equipment Recommendations  Wheelchair (measurements PT);Wheelchair cushion (measurements PT);Rolling walker with 5" wheels    Recommendations for Other Services       Precautions / Restrictions Precautions Precautions: Fall Restrictions Weight Bearing Restrictions: Yes RLE Weight Bearing: Weight bearing as tolerated      Mobility  Bed Mobility Overal bed mobility: Needs Assistance Bed Mobility: Supine to Sit     Supine to sit: Mod assist     General bed mobility comments: Assist to elevate trunk into sitting and bring hips to EOB    Transfers Overall transfer level: Needs assistance Equipment used: Ambulation equipment used Transfers: Sit to/from Stand Sit to Stand: Mod assist;From elevated surface         General transfer comment: Assist to bring hips up and for balance using Stedy from EOB. Stood x 3. Used Stedy for bed to recliner  Ambulation/Gait             General Gait Details: Did not attempt with 1 person assist  Stairs            Wheelchair Mobility    Modified Rankin (Stroke Patients Only)       Balance Overall balance assessment: Needs assistance Sitting-balance support: No upper extremity  supported;Feet supported Sitting balance-Leahy Scale: Fair     Standing balance support: Bilateral upper extremity supported Standing balance-Leahy Scale: Poor Standing balance comment: Stedy and min assist for static standing. Initial verbal/tactile cues to fully extend hips and trunk. Stood x 3 for 15-45 seconds                             Pertinent Vitals/Pain Pain Assessment: Faces Faces Pain Scale: Hurts little more Pain Location: abdomen and rt hip Pain Descriptors / Indicators: Moaning Pain Intervention(s): Limited activity within patient's tolerance;Monitored during session    Home Living Family/patient expects to be discharged to:: [place]: Shower seat - built in;Grab bars - tub/shower;Walker - 4 wheels Additional Comments: Pt has been at [facility] since hip fx 03/19/21    Prior Function Level of Independence: Needs assistance   Gait / Transfers Assistance Needed: Mod to max assist with transfers since rt hip ORIF on 03/19/21.           Hand Dominance   Dominant Hand: Right    Extremity/Trunk Assessment   Upper Extremity Assessment Upper Extremity Assessment: Generalized weakness    Lower Extremity Assessment Lower Extremity Assessment: Generalized weakness;RLE deficits/detail RLE Deficits / Details: Decr strength and AROM due to post op pain    Cervical / Trunk Assessment Cervical / Trunk Assessment: Kyphotic  Communication  Communication: HOH  Cognition Arousal/Alertness: Awake/alert Behavior During Therapy: WFL for tasks assessed/performed Overall Cognitive Status: No family/caregiver present to determine baseline cognitive functioning                                 General Comments: Dementia at baseline. Follows 1 step commands      General Comments General comments (skin integrity, edema, etc.): Pt on 4L o2 at rest. SpO2 88-89% after transfer to chair. Incr O2  to 5L with SpO2 to 92%.    Exercises     Assessment/Plan    PT Assessment Patient needs continued PT services  PT Problem List Decreased strength;Decreased activity tolerance;Decreased balance;Decreased mobility;Decreased knowledge of use of DME;Pain       PT Treatment Interventions DME instruction;Gait training;Functional mobility training;Therapeutic activities;Therapeutic exercise;Balance training;Patient/family education    PT Goals (Current goals can be found in the Care Plan section)  Acute Rehab PT Goals Patient Stated Goal: not stated PT Goal Formulation: Patient unable to participate in goal setting Time For Goal Achievement: 04/15/21 Potential to Achieve Goals: Fair    Frequency Min 2X/week   Barriers to discharge        Co-evaluation               AM-PAC PT "6 Clicks" Mobility  Outcome Measure Help needed turning from your back to your side while in a flat bed without using bedrails?: A Lot Help needed moving from lying on your back to sitting on the side of a flat bed without using bedrails?: A Lot Help needed moving to and from a bed to a chair (including a wheelchair)?: Total Help needed standing up from a chair using your arms (e.g., wheelchair or bedside chair)?: A Lot Help needed to walk in hospital room?: Total Help needed climbing 3-5 steps with a railing? : Total 6 Click Score: 9    End of Session Equipment Utilized During Treatment: Gait belt Activity Tolerance: Patient limited by fatigue Patient left: in chair;with chair alarm set;with call bell/phone within reach Nurse Communication: Mobility status PT Visit Diagnosis: Other abnormalities of gait and mobility (R26.89);Pain Pain - Right/Left: Right Pain - part of body: Hip    Time: 8786-7672 PT Time Calculation (min) (ACUTE ONLY): 30 min   Charges:   PT Evaluation $PT Eval Moderate Complexity: 1 Mod PT Treatments $Therapeutic Activity: 8-22 mins        Rafael Hernandez Pager [phone] Office [place] 04/01/2021, 12:18 PM

## 2021-04-01 NOTE — Progress Notes (Signed)
ANTICOAGULATION CONSULT NOTE  Pharmacy Consult for Heparin  Indication: pulmonary embolus and DVT  Allergies  Allergen Reactions   Cefuroxime Axetil Other (See Comments)    Reaction not recalled   Cyclosporine Other (See Comments)    Reaction not recalled   Elemental Sulfur Other (See Comments)    No energy, could not walk    Maxzide [Triamterene-Hctz] Other (See Comments)    Adverse reaction- bloating and abdominal pain   Sulfa Antibiotics Other (See Comments)    Could not sleep/eat and experienced gastric pain   Sulfamethoxazole Other (See Comments)    Could not sleep/eat and had gastric pain   Sulfamethoxazole-Trimethoprim Other (See Comments)    Gastric pain and could not eat/sleep   Prednisone Other (See Comments)    Can tolerate for a short period of time    Patient Measurements: Height: 5\' 7"  (170.2 cm) Weight: 79 kg (174 lb 2.6 oz) IBW/kg (Calculated) : 66.1  Vital Signs: Temp: 98.2 F (36.8 C) (07/15 0700) Temp Source: Oral (07/15 0700) BP: 105/65 (07/15 0700) Pulse Rate: 88 (07/15 0700)  Labs: Recent Labs    04/03/2021 1255 03/31/21 0057 03/31/21 1935 04/01/21 0532  HGB 9.1* 7.2* 8.9* 8.2*  HCT 28.8* 22.9* 27.5* 26.0*  PLT 231 196  --  199  APTT  --   --  134*  --   LABPROT 27.9*  --   --   --   INR 2.6*  --   --   --   HEPARINUNFRC  --   --  0.59 0.47  CREATININE 1.15 1.13  --   --      Estimated Creatinine Clearance: 42.2 mL/min (by C-G formula based on SCr of 1.13 mg/dL).   Medical History: Past Medical History:  Diagnosis Date   Carotid artery disease ([place])    Coronary artery disease    PCI RCA 2009   Dyslipidemia    Hypertension    Scleritis, unspecified    treated with prednisone   Seizure ([place])    felt due to cyclosporin in setting of electrolyte disorder   Solitary kidney    Symptomatic bradycardia    a. s/p STJ dual chamber pacemaker    Assessment: 87 y/o M came from a nursing facility yesterday with a reported new onset  DVT. Was supposedly started on Xarelto at the facility. Pt now with CT positive PE so heparin gtt started.  Heparin level therapeutic at 0.47, CBC stable, FOBT negative.   Goal of Therapy:  Heparin Level 0.3-0.7 units/ml Monitor platelets by anticoagulation protocol: Yes   Plan:  Continue heparin 1000 units/h Daily heparin level and CBC   Arrie Senate, PharmD, [place], [facility] Clinical Pharmacist [phone] Please check AMION for all [facility] Pharmacy numbers 04/01/2021

## 2021-04-01 NOTE — NC FL2 (Signed)
[place] LEVEL OF CARE SCREENING TOOL     IDENTIFICATION  Patient Name: [name] Birthdate: [birth year] Sex: male Admission Date (Current Location): 04/13/2021  [facility] and [place] Number:  Herbalist and Address:  [facility] [place], [place] [address], [place], [place] [address]      Provider Number: [id number]  Attending Physician Name and Address:  Elmarie Shiley, MD  Relative Name and Phone Number:  Raiford Simmonds (Daughter)   [phone] (Mobile)    Current Level of Care: Hospital Recommended Level of Care: [place] Prior Approval Number:    Date Approved/Denied:   PASRR Number: [id number]  Discharge Plan: SNF    Current Diagnoses: Patient Active Problem List   Diagnosis Date Noted   Acute respiratory failure with hypoxia ([place])    Hypotension    Pulmonary emboli ([place]) 04/01/2021   Cellulitis of left arm    Hyperglycemia 03/19/2021   Closed displaced intertrochanteric fracture of right femur ([place]) 03/18/2021   Renal insufficiency 03/18/2021   Hyponatremia 03/18/2021   Late onset Alzheimer's disease with behavioral disturbance ([place]) 11/02/2020   Benign prostatic hyperplasia with urinary frequency 08/27/2020   Myasthenia gravis ([place]) 08/27/2020   AMS (altered mental status) 26/94/8546   Toxic metabolic encephalopathy 27/11/5007   Stroke-like symptom 05/12/2020   Leukocytosis 05/12/2020   Macrocytosis 05/12/2020   Stroke-like symptoms 05/12/2020   Closed fracture of right distal humerus 07/03/2019   Carotid artery disease ([place]) 09/09/2018   Pacemaker-St.Jude 07/03/2012   Bruit 09/28/2011   Tongue lesion 08/15/2011   Essential hypertension 06/23/2010   Mixed hyperlipidemia 09/22/2008   Coronary artery disease involving native coronary artery of native heart without angina pectoris 09/22/2008   Edema 09/22/2008   SSS (sick sinus syndrome) ([place]) 09/22/2008    Orientation RESPIRATION BLADDER  Height & Weight     Self, Place  Normal Indwelling catheter Weight: 174 lb 2.6 oz (79 kg) Height:  5\' 7"  (170.2 cm)  BEHAVIORAL SYMPTOMS/MOOD NEUROLOGICAL BOWEL NUTRITION STATUS      Continent Diet (see d/c summary)  AMBULATORY STATUS COMMUNICATION OF NEEDS Skin   Extensive Assist Verbally Surgical wounds, Skin abrasions (Incision Right Thigh; skin tear right arm from fall; skin tear left leg from fall)                       Personal Care Assistance Level of Assistance  Bathing, Feeding, Dressing Bathing Assistance: Maximum assistance Feeding assistance: Independent Dressing Assistance: Maximum assistance     Functional Limitations Info  Sight, Hearing, Speech Sight Info: Adequate Hearing Info: Adequate Speech Info: Adequate    SPECIAL CARE FACTORS FREQUENCY  PT (By licensed PT), OT (By licensed OT)     PT Frequency: 5x week OT Frequency: 5x week            Contractures Contractures Info: Not present    Additional Factors Info  Code Status, Allergies Code Status Info: Full Code Allergies Info: Cefuroxime Axetil   Cyclosporine   Elemental Sulfur   Maxzide (Triamterene-hctz)   Sulfa Antibiotics   Sulfamethoxazole   Sulfamethoxazole-trimethoprim   Prednisone           Current Medications (04/01/2021):  This is the current hospital active medication list Current Facility-Administered Medications  Medication Dose Route Frequency Provider Last Rate Last Admin   acetaminophen (TYLENOL) tablet 650 mg  650 mg Oral Q6H PRN Lequita Halt, MD   650 mg at 03/27/2021 2341   albuterol (  PROVENTIL) (2.5 MG/3ML) 0.083% nebulizer solution 2.5 mg  2.5 mg Nebulization Q6H Regalado, Belkys A, MD   2.5 mg at 04/01/21 1517   bisacodyl (DULCOLAX) suppository 10 mg  10 mg Rectal Daily PRN Regalado, Belkys A, MD       ceFEPIme (MAXIPIME) 2 g in sodium chloride 0.9 % 100 mL IVPB  2 g Intravenous Q12H Regalado, Belkys A, MD 200 mL/hr at 04/01/21 0953 2 g at 04/01/21 0953   Chlorhexidine  Gluconate Cloth 2 % PADS 6 each  6 each Topical Daily Lequita Halt, MD   6 each at 04/01/21 0918   docusate sodium (COLACE) capsule 100 mg  100 mg Oral BID Wynetta Fines T, MD   100 mg at 04/01/21 0947   doxycycline (VIBRA-TABS) tablet 100 mg  100 mg Oral Q12H Wynetta Fines T, MD   100 mg at 04/01/21 0947   finasteride (PROSCAR) tablet 5 mg  5 mg Oral q morning Wynetta Fines T, MD   5 mg at 04/01/21 1152   guaiFENesin-dextromethorphan (ROBITUSSIN DM) 100-10 MG/5ML syrup 5 mL  5 mL Oral Q4H PRN Heloise Purpura, RPH       heparin ADULT infusion 100 units/mL (25000 units/252mL)  1,000 Units/hr Intravenous Continuous Sloan Leiter B, RPH 10 mL/hr at 04/01/21 0712 1,000 Units/hr at 04/01/21 3536   hydrocortisone sodium succinate (SOLU-CORTEF) 100 MG injection 50 mg  50 mg Intravenous Q6H Wynetta Fines T, MD   50 mg at 04/01/21 1646   LORazepam (ATIVAN) tablet 0.5 mg  0.5 mg Oral QHS PRN Wynetta Fines T, MD   0.5 mg at 04/01/21 0235   methocarbamol (ROBAXIN) tablet 500 mg  500 mg Oral Q6H PRN Wynetta Fines T, MD   500 mg at 04/01/21 1152   neomycin-bacitracin-polymyxin (NEOSPORIN) ointment   Topical See admin instructions Heloise Purpura, RPH       ondansetron [facility]) tablet 4 mg  4 mg Oral Q8H PRN Wynetta Fines T, MD       oxyCODONE (Oxy IR/ROXICODONE) immediate release tablet 5 mg  5 mg Oral Q6H PRN Wynetta Fines T, MD   5 mg at 04/01/21 1152   pantoprazole (PROTONIX) injection 40 mg  40 mg Intravenous Q24H Regalado, Belkys A, MD   40 mg at 04/01/21 1646   pyridostigmine (MESTINON) tablet 60 mg  60 mg Oral 4 times per day Wynetta Fines T, MD   60 mg at 04/01/21 1647   risperiDONE (RISPERDAL) tablet 0.5 mg  0.5 mg Oral BID PRN Regalado, Belkys A, MD       senna (SENOKOT) tablet 8.6 mg  1 tablet Oral Daily Regalado, Belkys A, MD       sodium phosphate (FLEET) 7-19 GM/118ML enema 1 enema  1 enema Rectal Once Regalado, Belkys A, MD       tamsulosin (FLOMAX) capsule 0.4 mg  0.4 mg Oral Daily Wynetta Fines T, MD   0.4  mg at 04/01/21 1443     Discharge Medications: Please see discharge summary for a list of discharge medications.  Relevant Imaging Results:  Relevant Lab Results:   Additional Information SS# [id number] Pt is covid vaccinated with 1 booster  Coralee Pesa, LCSWA

## 2021-04-01 NOTE — TOC Initial Note (Signed)
Transition of Care [facility]) - Initial/Assessment Note    Patient Details  Name: [name] MRN: [id number] Date of Birth: 1931/03/16  [facility] of [facility]) CM/SW Contact:    Coralee Pesa, [place] Phone Number: 04/01/2021, 4:46 PM  Clinical Narrative:                 CSW noted that pt is not oriented at this time. CSW spoke with dtr Shirlean Mylar who noted that pt is from [place] and the plan is for him to return. CSW spoke with [place] at [facility], who noted they would hold pt's bed and will take him back whenever he is ready. They will need a covid test before DC. CSW will complete FL2 in preparation. SW will continue to follow for DC.   Expected Discharge Plan: Skilled Nursing Facility Barriers to Discharge: Continued Medical Work up   Patient Goals and CMS Choice Patient states their goals for this hospitalization and ongoing recovery are:: Rehab CMS Medicare.gov Compare Post Acute Care list provided to:: Patient Choice offered to / list presented to : Adult Children  Expected Discharge Plan and Services Expected Discharge Plan: [place] In-house Referral: Clinical Social Work   Post Acute Care Choice: [place] Living arrangements for the past 2 months: [place]                                      Prior Living Arrangements/Services Living arrangements for the past 2 months: [place] Lives with:: Facility Resident Patient language and need for interpreter reviewed:: Yes Do you feel safe going back to the place where you live?: Yes      Need for Family Participation in Patient Care: Yes (Comment) Care giver support system in place?: Yes (comment)   Criminal Activity/Legal Involvement Pertinent to Current Situation/Hospitalization: No - Comment as needed  Activities of Daily Living      Permission Sought/Granted Permission sought to share information with : Family Supports, Research scientist (medical) granted to share information with : Yes, Verbal Permission Granted  Share Information with NAME: Shirlean Mylar  Permission granted to share info w AGENCY: [facility]  Permission granted to share info w Relationship: Daughter  Permission granted to share info w Contact Information: [phone]  Emotional Assessment Appearance:: Appears stated age Attitude/Demeanor/Rapport: Unable to Assess Affect (typically observed): Unable to Assess Orientation: : Oriented to Self Alcohol / Substance Use: Not Applicable Psych Involvement: No (comment)  Admission diagnosis:  Pain [R52] Cellulitis of left arm [L03.114] Pulmonary emboli (HCC) [I26.99] Acute deep vein thrombosis (DVT) of proximal vein of right lower extremity (HCC) [I82.4Y1] Acute pulmonary embolism, unspecified pulmonary embolism type, unspecified whether acute cor pulmonale present [facility]) [I26.99] Patient Active Problem List   Diagnosis Date Noted   Acute respiratory failure with hypoxia (HCC)    Hypotension    Pulmonary emboli ([place]) 04/15/2021   Cellulitis of left arm    Hyperglycemia 03/19/2021   Closed displaced intertrochanteric fracture of right femur ([place]) 03/18/2021   Renal insufficiency 03/18/2021   Hyponatremia 03/18/2021   Late onset Alzheimer's disease with behavioral disturbance ([place]) 11/02/2020   Benign prostatic hyperplasia with urinary frequency 08/27/2020   Myasthenia gravis () 08/27/2020   AMS (altered mental status) 67/67/2094   Toxic metabolic encephalopathy 70/96/2836   Stroke-like symptom 05/12/2020   Leukocytosis 05/12/2020   Macrocytosis 05/12/2020   Stroke-like symptoms 05/12/2020  Closed fracture of right distal humerus 07/03/2019   Carotid artery disease ([place]) 09/09/2018   Pacemaker-St.Jude 07/03/2012   Bruit 09/28/2011   Tongue lesion 08/15/2011   Essential hypertension 06/23/2010   Mixed hyperlipidemia 09/22/2008   Coronary artery disease involving native  coronary artery of native heart without angina pectoris 09/22/2008   Edema 09/22/2008   SSS (sick sinus syndrome) ([place]) 09/22/2008   PCP:  Shirline Frees, MD Pharmacy:  No Pharmacies Listed    Social Determinants of Health (SDOH) Interventions    Readmission Risk Interventions No flowsheet data found.

## 2021-04-01 NOTE — Progress Notes (Addendum)
PROGRESS NOTE    [name]  QMG:[id number] DOB: [birth year] DOA: 04/11/2021 PCP: Shirline Frees, MD   Brief Narrative: 87 year old with past medical history significant for right hip fracture status post ORIF with intramedullary nailing, urinary retention status post Foley catheter, myasthenia gravis on chronic Steroids, hypertension, BPH, dementia, essential tremor presents with hypotension.  Patient was recently discharged from the hospital 1 week ago after ORIF of the right hip.  Patient over the last 2 or 3 days developed right leg swelling and pain.  Doppler lower extremity was positive for DVT.  Patient was a started on Xarelto overnight.  The morning of admission patient was found to be in respiratory distress and an low blood pressure.  Patient was referred to the ED for further evaluation.  Patient was noted to have a rash on his left elbow.  Evaluation in the ED patient was noted to have borderline low blood pressure, hypoxia.  CTA positive for left lower lobe subsegmental PE.    Assessment & Plan:   Active Problems:   Pulmonary emboli (HCC)   Acute respiratory failure with hypoxia (HCC)   Hypotension   1-Subsegmental left lower lobe PE, DVT, Acute Hypoxic Respiratory Failure.  -Plan to start heparin drip, and monitor hemoglobin. -Transition to Xarelto, when hemoglobin and blood pressure stable. -BP stable. Plan to continue with heparin for now.  -Stop IV fluids. Plan to give him IV lasix.   2-Anemia;  Hb down to 7.6 Received one unit PRBC.  No melena.  Monitor.  Prior HD; 7.9 July 5.  Hb stable 8 range.   3-Sepsis, 1/2 Blood culture positive for Enterobacter Cloaceae.  Started on Cefepime.  Presents with leukocytosis, hypotension, left elbow cellulitis.  On IV steroid.  On antibiotics, Doxy. Started on Cefepime 7/14. BP low again. CCM consulted. IV bolus ordered.  WBC trending down, afebrile.   Abdominal pain;  On IV protonix.  Check KUB.  Had BM  7/14  Chronic steroid use;  On chronic prednisone.  Continue with IV hydrocortisone.   Hyponatremia:  Change diet to regular.  Received IV fluids.  Monitor.   Recent ORIF, Right Hip:  Ortho : Dr Victorino December.  Spoke with ortho, they will check on patient.   Chronic Indwelling Foley catheter:  Needs to follow up with urology.   Delirium;  More agitated today.  Plan to change risperidone to BID PRN agitation.  Sitter ordered.    Estimated body mass index is 27.28 kg/m as calculated from the following:   Height as of this encounter: 5\' 7"  (1.702 m).   Weight as of this encounter: 79 kg.   DVT prophylaxis: Heparin gtt Code Status: Full code Family Communication: Daughter updated. 7/15 Disposition Plan:  Status is: Inpatient  Remains inpatient appropriate because:Hemodynamically unstable  Dispo: The patient is from: SNF              Anticipated d/c is to: SNF              Patient currently is not medically stable to d/c.   Difficult to place patient No        Consultants:  None  Procedures:  ECHO P  Antimicrobials:    Subjective: He has been more agitated. Complaining of abdominal pain.  He denies worsening dyspnea.  His oxygen requirement increase to 3 L,, he is mouth breather.     Objective: Vitals:   04/01/21 0901 04/01/21 1100 04/01/21 1200 04/01/21 1517  BP: 126/71  126/73 126/73  Pulse: 84 90 (!) 103 91  Resp: 18 19 (!) 23 19  Temp:  97.9 F (36.6 C)    TempSrc:  Oral    SpO2: 97%  (!) 79% 99%  Weight:      Height:        Intake/Output Summary (Last 24 hours) at 04/01/2021 1520 Last data filed at 04/01/2021 1200 Gross per 24 hour  Intake 3227.33 ml  Output 1050 ml  Net 2177.33 ml   Filed Weights   04/16/2021 1229 03/26/2021 2204  Weight: 73.5 kg 79 kg    Examination:  General exam: NAD Respiratory system: BL crackles, wheezing Cardiovascular system: S 1. S 2 RRR Gastrointestinal system: BS present, soft, nt Central nervous  system: alert, confuse Extremities: Symmetric 5 x 5 power. Dressing right hip, bruises.     Data Reviewed: I have personally reviewed following labs and imaging studies  CBC: Recent Labs  Lab 04/15/2021 1255 03/31/21 0057 03/31/21 1935 04/01/21 0532  WBC 18.0* 13.0*  --  12.2*  NEUTROABS 16.8*  --   --   --   HGB 9.1* 7.2* 8.9* 8.2*  HCT 28.8* 22.9* 27.5* 26.0*  MCV 104.0* 101.8*  --  98.1  PLT 231 196  --  979   Basic Metabolic Panel: Recent Labs  Lab 04/05/2021 1255 03/31/21 0057 04/01/21 0726  NA 131* 128* 134*  K 4.2 3.9 3.8  CL 100 99 105  CO2 24 22 19*  GLUCOSE 126* 148* 137*  BUN 20 21 16   CREATININE 1.15 1.13 0.93  CALCIUM 8.2* 8.1* 8.7*   GFR: Estimated Creatinine Clearance: 51.3 mL/min (by C-G formula based on SCr of 0.93 mg/dL). Liver Function Tests: Recent Labs  Lab 03/27/2021 1255  AST 20  ALT 15  ALKPHOS 92  BILITOT 1.3*  PROT 5.6*  ALBUMIN 2.5*   No results for input(s): LIPASE, AMYLASE in the last 168 hours. No results for input(s): AMMONIA in the last 168 hours. Coagulation Profile: Recent Labs  Lab 03/18/2021 1255  INR 2.6*   Cardiac Enzymes: No results for input(s): CKTOTAL, CKMB, CKMBINDEX, TROPONINI in the last 168 hours. BNP (last 3 results) No results for input(s): PROBNP in the last 8760 hours. HbA1C: No results for input(s): HGBA1C in the last 72 hours. CBG: No results for input(s): GLUCAP in the last 168 hours. Lipid Profile: No results for input(s): CHOL, HDL, LDLCALC, TRIG, CHOLHDL, LDLDIRECT in the last 72 hours. Thyroid Function Tests: No results for input(s): TSH, T4TOTAL, FREET4, T3FREE, THYROIDAB in the last 72 hours. Anemia Panel: Recent Labs    03/21/2021 2253  FERRITIN 244  TIBC 272  IRON 22*  RETICCTPCT 7.8*   Sepsis Labs: Recent Labs  Lab 04/09/2021 1255 03/31/21 1354 03/31/21 1935  LATICACIDVEN 1.2 3.6* 2.4*    Recent Results (from the past 240 hour(s))  Resp Panel by RT-PCR (Flu A&B, Covid)  Nasopharyngeal Swab     Status: None   Collection Time: 03/23/21  9:33 AM   Specimen: Nasopharyngeal Swab; Nasopharyngeal(NP) swabs in vial transport medium  Result Value Ref Range Status   SARS Coronavirus 2 by RT PCR NEGATIVE NEGATIVE Final    Comment: (NOTE) SARS-CoV-2 target nucleic acids are NOT DETECTED.  The SARS-CoV-2 RNA is generally detectable in upper respiratory specimens during the acute phase of infection. The lowest concentration of SARS-CoV-2 viral copies this assay can detect is 138 copies/mL. A negative result does not preclude SARS-Cov-2 infection and should not be used as the sole basis for treatment  or other patient management decisions. A negative result may occur with  improper specimen collection/handling, submission of specimen other than nasopharyngeal swab, presence of viral mutation(s) within the areas targeted by this assay, and inadequate number of viral copies(<138 copies/mL). A negative result must be combined with clinical observations, patient history, and epidemiological information. The expected result is Negative.  Fact Sheet for Patients:  EntrepreneurPulse.com.au  Fact Sheet for Healthcare Providers:  IncredibleEmployment.be  This test is no t yet approved or cleared by the [place] FDA and  has been authorized for detection and/or diagnosis of SARS-CoV-2 by FDA under an Emergency Use Authorization (EUA). This EUA will remain  in effect (meaning this test can be used) for the duration of the COVID-19 declaration under Section 564(b)(1) of the Act, 21 U.S.C.section 360bbb-3(b)(1), unless the authorization is terminated  or revoked sooner.       Influenza A by PCR NEGATIVE NEGATIVE Final   Influenza B by PCR NEGATIVE NEGATIVE Final    Comment: (NOTE) The Xpert Xpress SARS-CoV-2/FLU/RSV plus assay is intended as an aid in the diagnosis of influenza from Nasopharyngeal swab specimens and should not be  used as a sole basis for treatment. Nasal washings and aspirates are unacceptable for Xpert Xpress SARS-CoV-2/FLU/RSV testing.  Fact Sheet for Patients: EntrepreneurPulse.com.au  Fact Sheet for Healthcare Providers: IncredibleEmployment.be  This test is not yet approved or cleared by the [place] FDA and has been authorized for detection and/or diagnosis of SARS-CoV-2 by FDA under an Emergency Use Authorization (EUA). This EUA will remain in effect (meaning this test can be used) for the duration of the COVID-19 declaration under Section 564(b)(1) of the Act, 21 U.S.C. section 360bbb-3(b)(1), unless the authorization is terminated or revoked.  Performed at [facility], [place] [address] [place], [place] [address]   Culture, blood (Routine x 2)     Status: Abnormal (Preliminary result)   Collection Time: 03/24/2021 12:55 PM   Specimen: BLOOD  Result Value Ref Range Status   Specimen Description BLOOD RIGHT ANTECUBITAL  Final   Special Requests   Final    BOTTLES DRAWN AEROBIC AND ANAEROBIC Blood Culture results may not be optimal due to an excessive volume of blood received in culture bottles   Culture  Setup Time   Final    GRAM NEGATIVE RODS AEROBIC BOTTLE ONLY Organism ID to follow CRITICAL RESULT CALLED TO, READ BACK BY AND VERIFIED WITHAndres Shad PHARMD 1327 03/31/21 A BROWNING    Culture (A)  Final    ENTEROBACTER CLOACAE SUSCEPTIBILITIES TO FOLLOW Performed at [facility], [place] [address], [place], [place] [address]    Report Status PENDING  Incomplete  Blood Culture ID Panel (Reflexed)     Status: Abnormal   Collection Time: 04/09/2021 12:55 PM  Result Value Ref Range Status   Enterococcus faecalis NOT DETECTED NOT DETECTED Final   Enterococcus Faecium NOT DETECTED NOT DETECTED Final   Listeria monocytogenes NOT DETECTED NOT DETECTED Final   Staphylococcus species NOT DETECTED NOT DETECTED Final   Staphylococcus aureus  (BCID) NOT DETECTED NOT DETECTED Final   Staphylococcus epidermidis NOT DETECTED NOT DETECTED Final   Staphylococcus lugdunensis NOT DETECTED NOT DETECTED Final   Streptococcus species NOT DETECTED NOT DETECTED Final   Streptococcus agalactiae NOT DETECTED NOT DETECTED Final   Streptococcus pneumoniae NOT DETECTED NOT DETECTED Final   Streptococcus pyogenes NOT DETECTED NOT DETECTED Final   A.calcoaceticus-baumannii NOT DETECTED NOT DETECTED Final   Bacteroides fragilis NOT DETECTED  NOT DETECTED Final   Enterobacterales DETECTED (A) NOT DETECTED Final    Comment: Enterobacterales represent a large order of gram negative bacteria, not a single organism.   Enterobacter cloacae complex DETECTED (A) NOT DETECTED Final    Comment: CRITICAL RESULT CALLED TO, READ BACK BY AND VERIFIED WITH: Andres Shad PHARMD 1327 03/31/21 A BROWNING    Escherichia coli NOT DETECTED NOT DETECTED Final   Klebsiella aerogenes NOT DETECTED NOT DETECTED Final   Klebsiella oxytoca NOT DETECTED NOT DETECTED Final   Klebsiella pneumoniae NOT DETECTED NOT DETECTED Final   Proteus species NOT DETECTED NOT DETECTED Final   Salmonella species NOT DETECTED NOT DETECTED Final   Serratia marcescens NOT DETECTED NOT DETECTED Final   Haemophilus influenzae NOT DETECTED NOT DETECTED Final   Neisseria meningitidis NOT DETECTED NOT DETECTED Final   Pseudomonas aeruginosa NOT DETECTED NOT DETECTED Final   Stenotrophomonas maltophilia NOT DETECTED NOT DETECTED Final   Candida albicans NOT DETECTED NOT DETECTED Final   Candida auris NOT DETECTED NOT DETECTED Final   Candida glabrata NOT DETECTED NOT DETECTED Final   Candida krusei NOT DETECTED NOT DETECTED Final   Candida parapsilosis NOT DETECTED NOT DETECTED Final   Candida tropicalis NOT DETECTED NOT DETECTED Final   Cryptococcus neoformans/gattii NOT DETECTED NOT DETECTED Final   CTX-M ESBL NOT DETECTED NOT DETECTED Final   Carbapenem resistance IMP NOT DETECTED NOT DETECTED  Final   Carbapenem resistance KPC NOT DETECTED NOT DETECTED Final   Carbapenem resistance NDM NOT DETECTED NOT DETECTED Final   Carbapenem resist OXA 48 LIKE NOT DETECTED NOT DETECTED Final   Carbapenem resistance VIM NOT DETECTED NOT DETECTED Final    Comment: Performed at [facility], [address], [place], [place] [address]  Resp Panel by RT-PCR (Flu A&B, Covid) Nasopharyngeal Swab     Status: None   Collection Time: 04/13/2021  1:00 PM   Specimen: Nasopharyngeal Swab; Nasopharyngeal(NP) swabs in vial transport medium  Result Value Ref Range Status   SARS Coronavirus 2 by RT PCR NEGATIVE NEGATIVE Final    Comment: (NOTE) SARS-CoV-2 target nucleic acids are NOT DETECTED.  The SARS-CoV-2 RNA is generally detectable in upper respiratory specimens during the acute phase of infection. The lowest concentration of SARS-CoV-2 viral copies this assay can detect is 138 copies/mL. A negative result does not preclude SARS-Cov-2 infection and should not be used as the sole basis for treatment or other patient management decisions. A negative result may occur with  improper specimen collection/handling, submission of specimen other than nasopharyngeal swab, presence of viral mutation(s) within the areas targeted by this assay, and inadequate number of viral copies(<138 copies/mL). A negative result must be combined with clinical observations, patient history, and epidemiological information. The expected result is Negative.  Fact Sheet for Patients:  EntrepreneurPulse.com.au  Fact Sheet for Healthcare Providers:  IncredibleEmployment.be  This test is no t yet approved or cleared by the [place] FDA and  has been authorized for detection and/or diagnosis of SARS-CoV-2 by FDA under an Emergency Use Authorization (EUA). This EUA will remain  in effect (meaning this test can be used) for the duration of the COVID-19 declaration under Section  564(b)(1) of the Act, 21 U.S.C.section 360bbb-3(b)(1), unless the authorization is terminated  or revoked sooner.       Influenza A by PCR NEGATIVE NEGATIVE Final   Influenza B by PCR NEGATIVE NEGATIVE Final    Comment: (NOTE) The Xpert Xpress SARS-CoV-2/FLU/RSV plus assay is intended as an aid  in the diagnosis of influenza from Nasopharyngeal swab specimens and should not be used as a sole basis for treatment. Nasal washings and aspirates are unacceptable for Xpert Xpress SARS-CoV-2/FLU/RSV testing.  Fact Sheet for Patients: EntrepreneurPulse.com.au  Fact Sheet for Healthcare Providers: IncredibleEmployment.be  This test is not yet approved or cleared by the [place] FDA and has been authorized for detection and/or diagnosis of SARS-CoV-2 by FDA under an Emergency Use Authorization (EUA). This EUA will remain in effect (meaning this test can be used) for the duration of the COVID-19 declaration under Section 564(b)(1) of the Act, 21 U.S.C. section 360bbb-3(b)(1), unless the authorization is terminated or revoked.  Performed at [facility], [place] [address] [place], [place] [address]   Surgical pcr screen     Status: None   Collection Time: 04/07/2021  5:23 PM   Specimen: Nasal Mucosa; Nasal Swab  Result Value Ref Range Status   MRSA, PCR NEGATIVE NEGATIVE Final   Staphylococcus aureus NEGATIVE NEGATIVE Final    Comment: (NOTE) The Xpert SA Assay (FDA approved for NASAL specimens in patients 70 years of age and older), is one component of a comprehensive surveillance program. It is not intended to diagnose infection nor to guide or monitor treatment. Performed at [facility], [place] [address], [place], [place] [address]   Culture, blood (Routine x 2)     Status: None (Preliminary result)   Collection Time: 03/22/2021 10:53 PM   Specimen: BLOOD  Result Value Ref Range Status   Specimen Description BLOOD LEFT ANTECUBITAL   Final   Special Requests   Final    BOTTLES DRAWN AEROBIC ONLY Blood Culture adequate volume   Culture   Final    NO GROWTH 2 DAYS Performed at [facility], [place] [address], [place], [place] [address]    Report Status PENDING  Incomplete         Radiology Studies: DG Elbow 2 Views Left  Result Date: 04/16/2021 CLINICAL DATA:  Left elbow swelling. EXAM: LEFT ELBOW - 2 VIEW COMPARISON:  None. FINDINGS: Diffuse soft tissue swelling is present in the left upper extremity both above and below the elbow. This is most significant posterior to the humerus. No underlying fracture or effusion is present. IMPRESSION: Diffuse soft tissue swelling without underlying fracture or effusion. Question cellulitis or obstructive edema. Electronically Signed   By: San Morelle M.D.   On: 04/07/2021 18:09   DG Chest Port 1 View  Result Date: 03/31/2021 CLINICAL DATA:  Acute respiratory failure with hypoxia EXAM: PORTABLE CHEST 1 VIEW COMPARISON:  03/19/2021 FINDINGS: Cardiac enlargement. Dual lead pacemaker. Atherosclerotic calcification aorta. COPD with pulmonary fibrosis in the bases. There is progression of bibasilar airspace disease which may be due to superimposed infiltrate or edema. Small left effusion. IMPRESSION: Progressive bibasilar airspace disease on background fibrosis. Possible edema or pneumonia. Electronically Signed   By: Franchot Gallo M.D.   On: 03/31/2021 13:42   ECHOCARDIOGRAM COMPLETE  Result Date: 03/31/2021    ECHOCARDIOGRAM REPORT   Patient Name:   [name] Date of Exam: 03/31/2021 Medical Rec #:  [id number]     Height:       67.0 in Accession #:    [id number]    Weight:       174.2 lb Date of Birth:  November 27, 1930      BSA:          1.907 m Patient Age:    79 years  BP:           104/57 mmHg Patient Gender: M             HR:           71 bpm. Exam Location:  Inpatient Procedure: 2D Echo, Cardiac Doppler, Color Doppler and Intracardiac            Opacification Agent  Indications:    Pulmonary Embolus I26.09  History:        Patient has prior history of Echocardiogram examinations, most                 recent 12/22/2019. CAD; Risk Factors:Dyslipidemia and                 Hypertension.  Sonographer:    Bernadene Person RDCS Referring Phys: [id number] [place]  1. Left ventricular ejection fraction, by estimation, is 55 to 60%. The left ventricle has normal function. The left ventricle has no regional wall motion abnormalities. Left ventricular diastolic parameters are consistent with Grade II diastolic dysfunction (pseudonormalization).  2. Peak RV-RA gradient 60.5 mmHg. The IVC was poorly visualized. Right ventricular systolic function is mildly reduced. The right ventricular size is mildly enlarged.  3. Left atrial size was mildly dilated.  4. The mitral valve is normal in structure. Moderate mitral valve regurgitation. No evidence of mitral stenosis.  5. The aortic valve is tricuspid. Aortic valve regurgitation is mild. Mild aortic valve sclerosis is present, with no evidence of aortic valve stenosis. FINDINGS  Left Ventricle: Left ventricular ejection fraction, by estimation, is 55 to 60%. The left ventricle has normal function. The left ventricle has no regional wall motion abnormalities. Definity contrast agent was given IV to delineate the left ventricular  endocardial borders. The left ventricular internal cavity size was normal in size. There is no left ventricular hypertrophy. Left ventricular diastolic parameters are consistent with Grade II diastolic dysfunction (pseudonormalization). Right Ventricle: Peak RV-RA gradient 60.5 mmHg. The IVC was poorly visualized. The right ventricular size is mildly enlarged. No increase in right ventricular wall thickness. Right ventricular systolic function is mildly reduced. Left Atrium: Left atrial size was mildly dilated. Right Atrium: Right atrial size was normal in size. Pericardium: There is no evidence of pericardial  effusion. Mitral Valve: The mitral valve is normal in structure. There is mild calcification of the mitral valve leaflet(s). Mild mitral annular calcification. Moderate mitral valve regurgitation. No evidence of mitral valve stenosis. Tricuspid Valve: The tricuspid valve is normal in structure. Tricuspid valve regurgitation is mild. Aortic Valve: The aortic valve is tricuspid. Aortic valve regurgitation is mild. Aortic regurgitation PHT measures 428 msec. Mild aortic valve sclerosis is present, with no evidence of aortic valve stenosis. Pulmonic Valve: The pulmonic valve was normal in structure. Pulmonic valve regurgitation is trivial. Aorta: The aortic root is normal in size and structure. Venous: The inferior vena cava was not well visualized. IAS/Shunts: No atrial level shunt detected by color flow Doppler.  LEFT VENTRICLE PLAX 2D LVIDd:         3.90 cm  Diastology LVIDs:         2.30 cm  LV e' medial:    8.41 cm/s LV PW:         1.10 cm  LV E/e' medial:  12.1 LV IVS:        1.00 cm  LV e' lateral:   9.37 cm/s LVOT diam:     2.10 cm  LV E/e' lateral: 10.9  LV SV:         77 LV SV Index:   40 LVOT Area:     3.46 cm  RIGHT VENTRICLE RV S prime:     9.37 cm/s TAPSE (M-mode): 2.4 cm LEFT ATRIUM           Index       RIGHT ATRIUM           Index LA diam:      4.20 cm 2.20 cm/m  RA Area:     15.40 cm LA Vol (A2C): 52.2 ml 27.38 ml/m RA Volume:   34.60 ml  18.15 ml/m LA Vol (A4C): 54.4 ml 28.53 ml/m  AORTIC VALVE LVOT Vmax:   92.70 cm/s LVOT Vmean:  67.900 cm/s LVOT VTI:    0.221 m AI PHT:      428 msec  AORTA Ao Root diam: 3.30 cm Ao Asc diam:  3.40 cm MITRAL VALVE                TRICUSPID VALVE MV Area (PHT): 5.27 cm     TR Peak grad:   60.5 mmHg MV Decel Time: 144 msec     TR Vmax:        389.00 cm/s MR PISA:        1.01 cm MR PISA Radius: 0.40 cm     SHUNTS MV E velocity: 102.00 cm/s  Systemic VTI:  0.22 m MV A velocity: 55.30 cm/s   Systemic Diam: 2.10 cm MV E/A ratio:  1.84 Loralie Champagne MD Electronically  signed by Loralie Champagne MD Signature Date/Time: 03/31/2021/4:20:40 PM    Final         Scheduled Meds:  albuterol  2.5 mg Nebulization Q6H   Chlorhexidine Gluconate Cloth  6 each Topical Daily   docusate sodium  100 mg Oral BID   doxycycline  100 mg Oral Q12H   finasteride  5 mg Oral q morning   hydrocortisone sod succinate (SOLU-CORTEF) inj  50 mg Intravenous Q6H   neomycin-bacitracin-polymyxin   Topical See admin instructions   pantoprazole (PROTONIX) IV  40 mg Intravenous Q24H   pyridostigmine  60 mg Oral 4 times per day   tamsulosin  0.4 mg Oral Daily   Continuous Infusions:  ceFEPime (MAXIPIME) IV 2 g (04/01/21 0953)   heparin 1,000 Units/hr (04/01/21 0712)     LOS: 2 days    Time spent: 35 minutes,.     Elmarie Shiley, MD [facility]   If 7PM-7AM, please contact night-coverage [email or web address]  04/01/2021, 3:20 PM

## 2021-04-02 DIAGNOSIS — L03114 Cellulitis of left upper limb: Secondary | ICD-10-CM | POA: Diagnosis not present

## 2021-04-02 LAB — CULTURE, BLOOD (ROUTINE X 2)

## 2021-04-02 LAB — BASIC METABOLIC PANEL
Anion gap: 8 (ref 5–15)
BUN: 16 mg/dL (ref 8–23)
CO2: 23 mmol/L (ref 22–32)
Calcium: 8.6 mg/dL — ABNORMAL LOW (ref 8.9–10.3)
Chloride: 103 mmol/L (ref 98–111)
Creatinine, Ser: 0.91 mg/dL (ref 0.61–1.24)
GFR, Estimated: >60 mL/min (ref >60)
Glucose, Bld: 144 mg/dL — ABNORMAL HIGH (ref 70–99)
Potassium: 3.3 mmol/L — ABNORMAL LOW (ref 3.5–5.1)
Sodium: 134 mmol/L — ABNORMAL LOW (ref 135–145)

## 2021-04-02 LAB — CBC
HCT: 28 % — ABNORMAL LOW (ref 39.0–52.0)
Hemoglobin: 8.9 g/dL — ABNORMAL LOW (ref 13.0–17.0)
MCH: 30.5 pg (ref 26.0–34.0)
MCHC: 31.8 g/dL (ref 30.0–36.0)
MCV: 95.9 fL (ref 80.0–100.0)
Platelets: 201 10*3/uL (ref 150–400)
RBC: 2.92 MIL/uL — ABNORMAL LOW (ref 4.22–5.81)
RDW: 19.8 % — ABNORMAL HIGH (ref 11.5–15.5)
WBC: 12.4 10*3/uL — ABNORMAL HIGH (ref 4.0–10.5)
nRBC: 0 % (ref 0.0–0.2)

## 2021-04-02 LAB — URINE CULTURE: Culture: NO GROWTH

## 2021-04-02 LAB — HEPARIN LEVEL (UNFRACTIONATED): Heparin Unfractionated: 0.44 IU/mL (ref 0.30–0.70)

## 2021-04-02 MED ORDER — POLYETHYLENE GLYCOL 3350 17 G PO PACK
17.0000 g | PACK | Freq: Every day | ORAL | Status: DC
  Administered 2021-04-02 – 2021-04-03 (×2): 17 g via ORAL
  Filled 2021-04-02 (×2): qty 1

## 2021-04-02 MED ORDER — POTASSIUM CHLORIDE CRYS ER 20 MEQ PO TBCR
20.0000 meq | EXTENDED_RELEASE_TABLET | Freq: Once | ORAL | Status: AC
  Administered 2021-04-02: 20 meq via ORAL
  Filled 2021-04-02: qty 1

## 2021-04-02 MED ORDER — RIVAROXABAN 15 MG PO TABS
15.0000 mg | ORAL_TABLET | Freq: Two times a day (BID) | ORAL | Status: DC
  Administered 2021-04-02 – 2021-04-07 (×11): 15 mg via ORAL
  Filled 2021-04-02 (×11): qty 1

## 2021-04-02 MED ORDER — RIVAROXABAN 20 MG PO TABS: 20.0000 mg | ORAL_TABLET | Freq: Every day | ORAL | Status: DC

## 2021-04-02 MED ORDER — POTASSIUM CHLORIDE CRYS ER 20 MEQ PO TBCR: 40.0000 meq | EXTENDED_RELEASE_TABLET | Freq: Two times a day (BID) | ORAL | Status: DC

## 2021-04-02 MED ORDER — POTASSIUM CHLORIDE CRYS ER 20 MEQ PO TBCR
40.0000 meq | EXTENDED_RELEASE_TABLET | Freq: Two times a day (BID) | ORAL | Status: DC
  Administered 2021-04-02 – 2021-04-06 (×8): 40 meq via ORAL
  Filled 2021-04-02 (×9): qty 2

## 2021-04-02 MED ORDER — HYDROCORTISONE NA SUCCINATE PF 100 MG IJ SOLR
50.0000 mg | Freq: Two times a day (BID) | INTRAMUSCULAR | Status: DC
  Administered 2021-04-02 – 2021-04-04 (×4): 50 mg via INTRAVENOUS
  Filled 2021-04-02 (×4): qty 2

## 2021-04-02 NOTE — Progress Notes (Addendum)
ANTICOAGULATION CONSULT NOTE  Pharmacy Consult for Heparin  Indication: pulmonary embolus and DVT  Allergies  Allergen Reactions   Cefuroxime Axetil Other (See Comments)    Reaction not recalled   Cyclosporine Other (See Comments)    Reaction not recalled   Elemental Sulfur Other (See Comments)    No energy, could not walk    Maxzide [Triamterene-Hctz] Other (See Comments)    Adverse reaction- bloating and abdominal pain   Sulfa Antibiotics Other (See Comments)    Could not sleep/eat and experienced gastric pain   Sulfamethoxazole Other (See Comments)    Could not sleep/eat and had gastric pain   Sulfamethoxazole-Trimethoprim Other (See Comments)    Gastric pain and could not eat/sleep   Prednisone Other (See Comments)    Can tolerate for a short period of time    Patient Measurements: Height: 5\' 7"  (170.2 cm) Weight: 79 kg (174 lb 2.6 oz) IBW/kg (Calculated) : 66.1  Vital Signs: Temp: 97.9 F (36.6 C) (07/16 0300) Temp Source: Oral (07/16 0300) BP: 149/75 (07/16 0300) Pulse Rate: 79 (07/16 0300)  Labs: Recent Labs    03/29/2021 1255 03/31/21 0057 03/31/21 1935 04/01/21 0532 04/01/21 0726 04/02/21 0034  HGB 9.1* 7.2* 8.9* 8.2*  --  8.9*  HCT 28.8* 22.9* 27.5* 26.0*  --  28.0*  PLT 231 196  --  199  --  201  APTT  --   --  134*  --   --   --   LABPROT 27.9*  --   --   --   --   --   INR 2.6*  --   --   --   --   --   HEPARINUNFRC  --   --  0.59 0.47  --  0.44  CREATININE 1.15 1.13  --   --  0.93 0.91     Estimated Creatinine Clearance: 52.5 mL/min (by C-G formula based on SCr of 0.91 mg/dL).   Medical History: Past Medical History:  Diagnosis Date   Carotid artery disease ([place])    Coronary artery disease    PCI RCA 2009   Dyslipidemia    Hypertension    Scleritis, unspecified    treated with prednisone   Seizure ([place])    felt due to cyclosporin in setting of electrolyte disorder   Solitary kidney    Symptomatic bradycardia    a. s/p STJ dual  chamber pacemaker    Assessment: 87 y/o M came from a nursing facility yesterday with a reported new onset DVT. Was supposedly started on Xarelto at the facility. Pt now with CT positive PE so heparin gtt started.  Heparin level therapeutic at 0.44, CBC stable, FOBT negative 7/15.   Goal of Therapy:  Heparin Level 0.3-0.7 units/ml Monitor platelets by anticoagulation protocol: Yes   Plan:  Continue heparin 1000 units/h Daily heparin level and CBC  ADDENDUM: back to Xarelto today. Pt reportedly started on 7/12 pm. Will resume with original dosing as pt has been on heparin and this is unlikely to be xarelto failure given pt received only 2 doses before PE found.  Plan: Stop heparin Xarelto 15mg  BID through 8/2 then 20mg  daily   Arrie Senate, PharmD, BCPS, [facility] Clinical Pharmacist [phone] Please check AMION for all [place] numbers 04/02/2021

## 2021-04-02 NOTE — Progress Notes (Signed)
PROGRESS NOTE    [name]  GDJ:[id number] DOB: November 03, 1930 DOA: 04/16/2021 PCP: Shirline Frees, MD   Brief Narrative: 87 year old with past medical history significant for right hip fracture status post ORIF with intramedullary nailing, urinary retention status post Foley catheter, myasthenia gravis on chronic Steroids, hypertension, BPH, dementia, essential tremor presents with hypotension.  Patient was recently discharged from the hospital 1 week ago after ORIF of the right hip.  Patient over the last 2 or 3 days developed right leg swelling and pain.  Doppler lower extremity was positive for DVT.  Patient was a started on Xarelto overnight.  The morning of admission patient was found to be in respiratory distress and an low blood pressure.  Patient was referred to the ED for further evaluation.  Patient was noted to have a rash on his left elbow.  Evaluation in the ED patient was noted to have borderline low blood pressure, hypoxia.  CTA positive for left lower lobe subsegmental PE.    Assessment & Plan:   Active Problems:   Pulmonary emboli (HCC)   Acute respiratory failure with hypoxia (HCC)   Hypotension   1-Subsegmental left lower lobe PE, DVT,  Acute Hypoxic Respiratory Failure.  Acute pulmonary edema secondary to acute diastolic HF exacerbation:  -Patient was started on heparin drip.  -Plan to transition to [place] 7/16. -Chest X-ray 7/15: Progressive interstitial and ground glass opacities consistent with worsening edema or infection.  -Started on IV lasix 7/15 to help with pulmonary edema.  -Oxygen sat stable on 3 L.  -Continue with IV lasix.   2-Anemia;  Hb down to 7.6 Received one unit PRBC.  No melena.  Monitor.  Prior HD; 7.9 July 5.  Hb stable 8 range.   3-Sepsis, 1/2 Blood culture positive for Enterobacter Cloaceae.  Started on Cefepime.  Presents with leukocytosis, hypotension, left elbow cellulitis.  On IV steroid.  On antibiotics, Doxy. Started  on Cefepime 7/14. BP low again. CCM consulted. IV bolus ordered.  WBC trending down, afebrile.  Plan to repeat Blood culture today.   Abdominal pain;  On IV protonix.  KUB: mild ileus, Moderate stool in the colon including moderate retained feces at the rectum. Had BM 7/14 Had moderate BM 7/15. Continue with miralax, senna, PRN supp.   Chronic steroid use;  On chronic prednisone.  Continue with IV hydrocortisone.  Plan to resume prednisone tomorrow.  Hyponatremia:  Change diet to regular.  Received IV fluids.  Monitor.   Recent ORIF, Right Hip:  Ortho : Dr Victorino December.  Evaluated by Ortho continue with staples and dry dressing PRN  Chronic Indwelling Foley catheter:  Needs to follow up with urology.   Delirium;  More agitated today.  Plan to change risperidone to BID PRN agitation.  Sitter ordered.    Estimated body mass index is 27.28 kg/m as calculated from the following:   Height as of this encounter: 5\' 7"  (1.702 m).   Weight as of this encounter: 79 kg.   DVT prophylaxis: Heparin gtt Code Status: Full code Family Communication: Daughter updated. 7/16 Disposition Plan:  Status is: Inpatient  Remains inpatient appropriate because:Hemodynamically unstable  Dispo: The patient is from: SNF              Anticipated d/c is to: SNF              Patient currently is not medically stable to d/c.   Difficult to place patient No        Consultants:  None  Procedures:  ECHO P  Antimicrobials:    Subjective: He is more calm this morning. He denies dyspnea, chest pain.  He had moderate BM per nurse report.  He denies pain.     Objective: Vitals:   04/01/21 1930 04/01/21 2303 04/02/21 0117 04/02/21 0300  BP:  139/81  (!) 149/75  Pulse: 87 80 98 79  Resp:  20  20  Temp:  97.7 F (36.5 C)  97.9 F (36.6 C)  TempSrc:  Oral  Oral  SpO2: 98% 96% 96% 95%  Weight:      Height:        Intake/Output Summary (Last 24 hours) at 04/02/2021  0712 Last data filed at 04/02/2021 0643 Gross per 24 hour  Intake 2620 ml  Output 2700 ml  Net -80 ml    Filed Weights   04/07/2021 1229 04/17/2021 2204  Weight: 73.5 kg 79 kg    Examination:  General exam: NAD Respiratory system: BL crackles, no wheezing.  Cardiovascular system: S 1, S 2 RRR Gastrointestinal system: BS present, soft, NT Central nervous system: Alert, confuse.  Extremities: Symmetric 5 x 5 power. Dressing right hip, bruises.     Data Reviewed: I have personally reviewed following labs and imaging studies  CBC: Recent Labs  Lab 04/10/2021 1255 03/31/21 0057 03/31/21 1935 04/01/21 0532 04/02/21 0034  WBC 18.0* 13.0*  --  12.2* 12.4*  NEUTROABS 16.8*  --   --   --   --   HGB 9.1* 7.2* 8.9* 8.2* 8.9*  HCT 28.8* 22.9* 27.5* 26.0* 28.0*  MCV 104.0* 101.8*  --  98.1 95.9  PLT 231 196  --  199 638    Basic Metabolic Panel: Recent Labs  Lab 03/27/2021 1255 03/31/21 0057 04/01/21 0726 04/02/21 0034  NA 131* 128* 134* 134*  K 4.2 3.9 3.8 3.3*  CL 100 99 105 103  CO2 24 22 19* 23  GLUCOSE 126* 148* 137* 144*  BUN 20 21 16 16   CREATININE 1.15 1.13 0.93 0.91  CALCIUM 8.2* 8.1* 8.7* 8.6*    GFR: Estimated Creatinine Clearance: 52.5 mL/min (by C-G formula based on SCr of 0.91 mg/dL). Liver Function Tests: Recent Labs  Lab 04/01/2021 1255  AST 20  ALT 15  ALKPHOS 92  BILITOT 1.3*  PROT 5.6*  ALBUMIN 2.5*    No results for input(s): LIPASE, AMYLASE in the last 168 hours. No results for input(s): AMMONIA in the last 168 hours. Coagulation Profile: Recent Labs  Lab 03/18/2021 1255  INR 2.6*    Cardiac Enzymes: No results for input(s): CKTOTAL, CKMB, CKMBINDEX, TROPONINI in the last 168 hours. BNP (last 3 results) No results for input(s): PROBNP in the last 8760 hours. HbA1C: No results for input(s): HGBA1C in the last 72 hours. CBG: No results for input(s): GLUCAP in the last 168 hours. Lipid Profile: No results for input(s): CHOL, HDL,  LDLCALC, TRIG, CHOLHDL, LDLDIRECT in the last 72 hours. Thyroid Function Tests: No results for input(s): TSH, T4TOTAL, FREET4, T3FREE, THYROIDAB in the last 72 hours. Anemia Panel: Recent Labs    04/02/2021 2253  FERRITIN 244  TIBC 272  IRON 22*  RETICCTPCT 7.8*    Sepsis Labs: Recent Labs  Lab 04/10/2021 1255 03/31/21 1354 03/31/21 1935  LATICACIDVEN 1.2 3.6* 2.4*     Recent Results (from the past 240 hour(s))  Resp Panel by RT-PCR (Flu A&B, Covid) Nasopharyngeal Swab     Status: None   Collection Time: 03/23/21  9:33 AM  Specimen: Nasopharyngeal Swab; Nasopharyngeal(NP) swabs in vial transport medium  Result Value Ref Range Status   SARS Coronavirus 2 by RT PCR NEGATIVE NEGATIVE Final    Comment: (NOTE) SARS-CoV-2 target nucleic acids are NOT DETECTED.  The SARS-CoV-2 RNA is generally detectable in upper respiratory specimens during the acute phase of infection. The lowest concentration of SARS-CoV-2 viral copies this assay can detect is 138 copies/mL. A negative result does not preclude SARS-Cov-2 infection and should not be used as the sole basis for treatment or other patient management decisions. A negative result may occur with  improper specimen collection/handling, submission of specimen other than nasopharyngeal swab, presence of viral mutation(s) within the areas targeted by this assay, and inadequate number of viral copies(<138 copies/mL). A negative result must be combined with clinical observations, patient history, and epidemiological information. The expected result is Negative.  Fact Sheet for Patients:  EntrepreneurPulse.com.au  Fact Sheet for Healthcare Providers:  IncredibleEmployment.be  This test is no t yet approved or cleared by the [place] FDA and  has been authorized for detection and/or diagnosis of SARS-CoV-2 by FDA under an Emergency Use Authorization (EUA). This EUA will remain  in effect  (meaning this test can be used) for the duration of the COVID-19 declaration under Section 564(b)(1) of the Act, 21 U.S.C.section 360bbb-3(b)(1), unless the authorization is terminated  or revoked sooner.       Influenza A by PCR NEGATIVE NEGATIVE Final   Influenza B by PCR NEGATIVE NEGATIVE Final    Comment: (NOTE) The Xpert Xpress SARS-CoV-2/FLU/RSV plus assay is intended as an aid in the diagnosis of influenza from Nasopharyngeal swab specimens and should not be used as a sole basis for treatment. Nasal washings and aspirates are unacceptable for Xpert Xpress SARS-CoV-2/FLU/RSV testing.  Fact Sheet for Patients: EntrepreneurPulse.com.au  Fact Sheet for Healthcare Providers: IncredibleEmployment.be  This test is not yet approved or cleared by the [place] FDA and has been authorized for detection and/or diagnosis of SARS-CoV-2 by FDA under an Emergency Use Authorization (EUA). This EUA will remain in effect (meaning this test can be used) for the duration of the COVID-19 declaration under Section 564(b)(1) of the Act, 21 U.S.C. section 360bbb-3(b)(1), unless the authorization is terminated or revoked.  Performed at [facility], [place] [address], [place], [place] [address]   Culture, blood (Routine x 2)     Status: Abnormal (Preliminary result)   Collection Time: 04/04/2021 12:55 PM   Specimen: BLOOD  Result Value Ref Range Status   Specimen Description BLOOD RIGHT ANTECUBITAL  Final   Special Requests   Final    BOTTLES DRAWN AEROBIC AND ANAEROBIC Blood Culture results may not be optimal due to an excessive volume of blood received in culture bottles   Culture  Setup Time   Final    GRAM NEGATIVE RODS AEROBIC BOTTLE ONLY Organism ID to follow CRITICAL RESULT CALLED TO, READ BACK BY AND VERIFIED WITHAndres Shad PHARMD 1327 03/31/21 A BROWNING    Culture (A)  Final    ENTEROBACTER CLOACAE SUSCEPTIBILITIES TO FOLLOW Performed at  [facility], [place] [address] [place], [place] [address]    Report Status PENDING  Incomplete  Blood Culture ID Panel (Reflexed)     Status: Abnormal   Collection Time: 04/17/2021 12:55 PM  Result Value Ref Range Status   Enterococcus faecalis NOT DETECTED NOT DETECTED Final   Enterococcus Faecium NOT DETECTED NOT DETECTED Final   Listeria monocytogenes NOT DETECTED NOT DETECTED  Final   Staphylococcus species NOT DETECTED NOT DETECTED Final   Staphylococcus aureus (BCID) NOT DETECTED NOT DETECTED Final   Staphylococcus epidermidis NOT DETECTED NOT DETECTED Final   Staphylococcus lugdunensis NOT DETECTED NOT DETECTED Final   Streptococcus species NOT DETECTED NOT DETECTED Final   Streptococcus agalactiae NOT DETECTED NOT DETECTED Final   Streptococcus pneumoniae NOT DETECTED NOT DETECTED Final   Streptococcus pyogenes NOT DETECTED NOT DETECTED Final   A.calcoaceticus-baumannii NOT DETECTED NOT DETECTED Final   Bacteroides fragilis NOT DETECTED NOT DETECTED Final   Enterobacterales DETECTED (A) NOT DETECTED Final    Comment: Enterobacterales represent a large order of gram negative bacteria, not a single organism.   Enterobacter cloacae complex DETECTED (A) NOT DETECTED Final    Comment: CRITICAL RESULT CALLED TO, READ BACK BY AND VERIFIED WITH: Andres Shad PHARMD 1327 03/31/21 A BROWNING    Escherichia coli NOT DETECTED NOT DETECTED Final   Klebsiella aerogenes NOT DETECTED NOT DETECTED Final   Klebsiella oxytoca NOT DETECTED NOT DETECTED Final   Klebsiella pneumoniae NOT DETECTED NOT DETECTED Final   Proteus species NOT DETECTED NOT DETECTED Final   Salmonella species NOT DETECTED NOT DETECTED Final   Serratia marcescens NOT DETECTED NOT DETECTED Final   Haemophilus influenzae NOT DETECTED NOT DETECTED Final   Neisseria meningitidis NOT DETECTED NOT DETECTED Final   Pseudomonas aeruginosa NOT DETECTED NOT DETECTED Final   Stenotrophomonas maltophilia NOT DETECTED NOT DETECTED Final    Candida albicans NOT DETECTED NOT DETECTED Final   Candida auris NOT DETECTED NOT DETECTED Final   Candida glabrata NOT DETECTED NOT DETECTED Final   Candida krusei NOT DETECTED NOT DETECTED Final   Candida parapsilosis NOT DETECTED NOT DETECTED Final   Candida tropicalis NOT DETECTED NOT DETECTED Final   Cryptococcus neoformans/gattii NOT DETECTED NOT DETECTED Final   CTX-M ESBL NOT DETECTED NOT DETECTED Final   Carbapenem resistance IMP NOT DETECTED NOT DETECTED Final   Carbapenem resistance KPC NOT DETECTED NOT DETECTED Final   Carbapenem resistance NDM NOT DETECTED NOT DETECTED Final   Carbapenem resist OXA 48 LIKE NOT DETECTED NOT DETECTED Final   Carbapenem resistance VIM NOT DETECTED NOT DETECTED Final    Comment: Performed at [facility], [address] [place], [place] [address]  Resp Panel by RT-PCR (Flu A&B, Covid) Nasopharyngeal Swab     Status: None   Collection Time: 04/17/2021  1:00 PM   Specimen: Nasopharyngeal Swab; Nasopharyngeal(NP) swabs in vial transport medium  Result Value Ref Range Status   SARS Coronavirus 2 by RT PCR NEGATIVE NEGATIVE Final    Comment: (NOTE) SARS-CoV-2 target nucleic acids are NOT DETECTED.  The SARS-CoV-2 RNA is generally detectable in upper respiratory specimens during the acute phase of infection. The lowest concentration of SARS-CoV-2 viral copies this assay can detect is 138 copies/mL. A negative result does not preclude SARS-Cov-2 infection and should not be used as the sole basis for treatment or other patient management decisions. A negative result may occur with  improper specimen collection/handling, submission of specimen other than nasopharyngeal swab, presence of viral mutation(s) within the areas targeted by this assay, and inadequate number of viral copies(<138 copies/mL). A negative result must be combined with clinical observations, patient history, and epidemiological information. The expected result is  Negative.  Fact Sheet for Patients:  EntrepreneurPulse.com.au  Fact Sheet for Healthcare Providers:  IncredibleEmployment.be  This test is no t yet approved or cleared by the [place] FDA and  has been authorized for detection  and/or diagnosis of SARS-CoV-2 by FDA under an Emergency Use Authorization (EUA). This EUA will remain  in effect (meaning this test can be used) for the duration of the COVID-19 declaration under Section 564(b)(1) of the Act, 21 U.S.C.section 360bbb-3(b)(1), unless the authorization is terminated  or revoked sooner.       Influenza A by PCR NEGATIVE NEGATIVE Final   Influenza B by PCR NEGATIVE NEGATIVE Final    Comment: (NOTE) The Xpert Xpress SARS-CoV-2/FLU/RSV plus assay is intended as an aid in the diagnosis of influenza from Nasopharyngeal swab specimens and should not be used as a sole basis for treatment. Nasal washings and aspirates are unacceptable for Xpert Xpress SARS-CoV-2/FLU/RSV testing.  Fact Sheet for Patients: EntrepreneurPulse.com.au  Fact Sheet for Healthcare Providers: IncredibleEmployment.be  This test is not yet approved or cleared by the [place] FDA and has been authorized for detection and/or diagnosis of SARS-CoV-2 by FDA under an Emergency Use Authorization (EUA). This EUA will remain in effect (meaning this test can be used) for the duration of the COVID-19 declaration under Section 564(b)(1) of the Act, 21 U.S.C. section 360bbb-3(b)(1), unless the authorization is terminated or revoked.  Performed at [facility], [place] [address], [place], [place] [address]   Surgical pcr screen     Status: None   Collection Time: 03/25/2021  5:23 PM   Specimen: Nasal Mucosa; Nasal Swab  Result Value Ref Range Status   MRSA, PCR NEGATIVE NEGATIVE Final   Staphylococcus aureus NEGATIVE NEGATIVE Final    Comment: (NOTE) The Xpert SA Assay (FDA approved  for NASAL specimens in patients 70 years of age and older), is one component of a comprehensive surveillance program. It is not intended to diagnose infection nor to guide or monitor treatment. Performed at [facility], [place] [address] [place], [place] [address]   Culture, blood (Routine x 2)     Status: None (Preliminary result)   Collection Time: 04/14/2021 10:53 PM   Specimen: BLOOD  Result Value Ref Range Status   Specimen Description BLOOD LEFT ANTECUBITAL  Final   Special Requests   Final    BOTTLES DRAWN AEROBIC ONLY Blood Culture adequate volume   Culture   Final    NO GROWTH 2 DAYS Performed at [facility], [place] [address] [place], [place] [address]    Report Status PENDING  Incomplete          Radiology Studies: DG Abd 1 View  Result Date: 04/01/2021 CLINICAL DATA:  Abdomen pain EXAM: ABDOMEN - 1 VIEW COMPARISON:  CT 11/23/2008 FINDINGS: Mild diffuse increased bowel gas throughout. Moderate stool in the colon including moderate impacted feces at the rectum. No radiopaque calculi. Scoliosis of the spine. Hardware in the right hip. IMPRESSION: 1. Mild diffuse increased bowel gas throughout, question mild ileus. 2. Moderate stool in the colon including moderate retained feces at the rectum. Electronically Signed   By: Donavan Foil M.D.   On: 04/01/2021 15:54   DG CHEST PORT 1 VIEW  Result Date: 04/01/2021 CLINICAL DATA:  Short of breath, hypoxemia EXAM: PORTABLE CHEST 1 VIEW COMPARISON:  03/31/2021 FINDINGS: Single frontal view of the chest demonstrates stable dual lead pacer. Cardiac silhouette is unchanged. There has been progression of the interstitial and ground-glass opacity seen previously, with relative sparing of the right upper lung zone. Trace left pleural effusion unchanged. No pneumothorax. IMPRESSION: 1. Progressive interstitial and ground-glass opacities consistent with worsening edema or infection. 2. Stable trace left pleural  effusion.  Electronically Signed   By: Randa Ngo M.D.   On: 04/01/2021 17:46   DG Chest Port 1 View  Result Date: 03/31/2021 CLINICAL DATA:  Acute respiratory failure with hypoxia EXAM: PORTABLE CHEST 1 VIEW COMPARISON:  04/17/2021 FINDINGS: Cardiac enlargement. Dual lead pacemaker. Atherosclerotic calcification aorta. COPD with pulmonary fibrosis in the bases. There is progression of bibasilar airspace disease which may be due to superimposed infiltrate or edema. Small left effusion. IMPRESSION: Progressive bibasilar airspace disease on background fibrosis. Possible edema or pneumonia. Electronically Signed   By: Franchot Gallo M.D.   On: 03/31/2021 13:42   ECHOCARDIOGRAM COMPLETE  Result Date: 03/31/2021    ECHOCARDIOGRAM REPORT   Patient Name:   TREVONTE ASHKAR Kimbler Date of Exam: 03/31/2021 Medical Rec #:  [id number]     Height:       67.0 in Accession #:    [id number]    Weight:       174.2 lb Date of Birth:  Oct 27, 1930      BSA:          1.907 m Patient Age:    53 years      BP:           104/57 mmHg Patient Gender: M             HR:           71 bpm. Exam Location:  Inpatient Procedure: 2D Echo, Cardiac Doppler, Color Doppler and Intracardiac            Opacification Agent Indications:    Pulmonary Embolus I26.09  History:        Patient has prior history of Echocardiogram examinations, most                 recent 12/22/2019. CAD; Risk Factors:Dyslipidemia and                 Hypertension.  Sonographer:    Bernadene Person RDCS Referring Phys: [id number] [place]  1. Left ventricular ejection fraction, by estimation, is 55 to 60%. The left ventricle has normal function. The left ventricle has no regional wall motion abnormalities. Left ventricular diastolic parameters are consistent with Grade II diastolic dysfunction (pseudonormalization).  2. Peak RV-RA gradient 60.5 mmHg. The IVC was poorly visualized. Right ventricular systolic function is mildly reduced. The right ventricular size is mildly enlarged.  3.  Left atrial size was mildly dilated.  4. The mitral valve is normal in structure. Moderate mitral valve regurgitation. No evidence of mitral stenosis.  5. The aortic valve is tricuspid. Aortic valve regurgitation is mild. Mild aortic valve sclerosis is present, with no evidence of aortic valve stenosis. FINDINGS  Left Ventricle: Left ventricular ejection fraction, by estimation, is 55 to 60%. The left ventricle has normal function. The left ventricle has no regional wall motion abnormalities. Definity contrast agent was given IV to delineate the left ventricular  endocardial borders. The left ventricular internal cavity size was normal in size. There is no left ventricular hypertrophy. Left ventricular diastolic parameters are consistent with Grade II diastolic dysfunction (pseudonormalization). Right Ventricle: Peak RV-RA gradient 60.5 mmHg. The IVC was poorly visualized. The right ventricular size is mildly enlarged. No increase in right ventricular wall thickness. Right ventricular systolic function is mildly reduced. Left Atrium: Left atrial size was mildly dilated. Right Atrium: Right atrial size was normal in size. Pericardium: There is no evidence of pericardial effusion. Mitral Valve: The mitral valve is normal in structure.  There is mild calcification of the mitral valve leaflet(s). Mild mitral annular calcification. Moderate mitral valve regurgitation. No evidence of mitral valve stenosis. Tricuspid Valve: The tricuspid valve is normal in structure. Tricuspid valve regurgitation is mild. Aortic Valve: The aortic valve is tricuspid. Aortic valve regurgitation is mild. Aortic regurgitation PHT measures 428 msec. Mild aortic valve sclerosis is present, with no evidence of aortic valve stenosis. Pulmonic Valve: The pulmonic valve was normal in structure. Pulmonic valve regurgitation is trivial. Aorta: The aortic root is normal in size and structure. Venous: The inferior vena cava was not well visualized.  IAS/Shunts: No atrial level shunt detected by color flow Doppler.  LEFT VENTRICLE PLAX 2D LVIDd:         3.90 cm  Diastology LVIDs:         2.30 cm  LV e' medial:    8.41 cm/s LV PW:         1.10 cm  LV E/e' medial:  12.1 LV IVS:        1.00 cm  LV e' lateral:   9.37 cm/s LVOT diam:     2.10 cm  LV E/e' lateral: 10.9 LV SV:         77 LV SV Index:   40 LVOT Area:     3.46 cm  RIGHT VENTRICLE RV S prime:     9.37 cm/s TAPSE (M-mode): 2.4 cm LEFT ATRIUM           Index       RIGHT ATRIUM           Index LA diam:      4.20 cm 2.20 cm/m  RA Area:     15.40 cm LA Vol (A2C): 52.2 ml 27.38 ml/m RA Volume:   34.60 ml  18.15 ml/m LA Vol (A4C): 54.4 ml 28.53 ml/m  AORTIC VALVE LVOT Vmax:   92.70 cm/s LVOT Vmean:  67.900 cm/s LVOT VTI:    0.221 m AI PHT:      428 msec  AORTA Ao Root diam: 3.30 cm Ao Asc diam:  3.40 cm MITRAL VALVE                TRICUSPID VALVE MV Area (PHT): 5.27 cm     TR Peak grad:   60.5 mmHg MV Decel Time: 144 msec     TR Vmax:        389.00 cm/s MR PISA:        1.01 cm MR PISA Radius: 0.40 cm     SHUNTS MV E velocity: 102.00 cm/s  Systemic VTI:  0.22 m MV A velocity: 55.30 cm/s   Systemic Diam: 2.10 cm MV E/A ratio:  1.84 Loralie Champagne MD Electronically signed by Loralie Champagne MD Signature Date/Time: 03/31/2021/4:20:40 PM    Final         Scheduled Meds:  albuterol  2.5 mg Nebulization Q6H   Chlorhexidine Gluconate Cloth  6 each Topical Daily   docusate sodium  100 mg Oral BID   doxycycline  100 mg Oral Q12H   finasteride  5 mg Oral q morning   furosemide  20 mg Intravenous Q12H   hydrocortisone sod succinate (SOLU-CORTEF) inj  50 mg Intravenous Q6H   neomycin-bacitracin-polymyxin   Topical See admin instructions   pantoprazole (PROTONIX) IV  40 mg Intravenous Q24H   potassium chloride  40 mEq Oral BID   pyridostigmine  60 mg Oral 4 times per day   senna  1 tablet Oral Daily  sodium phosphate  1 enema Rectal Once   tamsulosin  0.4 mg Oral Daily   Continuous Infusions:   ceFEPime (MAXIPIME) IV 2 g (04/01/21 2227)   heparin 1,000 Units/hr (04/01/21 0712)     LOS: 3 days    Time spent: 35 minutes,.     Elmarie Shiley, MD [facility]   If 7PM-7AM, please contact night-coverage [email or web address]  04/02/2021, 7:12 AM

## 2021-04-03 ENCOUNTER — Inpatient Hospital Stay (HOSPITAL_COMMUNITY): Payer: Medicare Other

## 2021-04-03 DIAGNOSIS — J9601 Acute respiratory failure with hypoxia: Secondary | ICD-10-CM

## 2021-04-03 DIAGNOSIS — L03114 Cellulitis of left upper limb: Secondary | ICD-10-CM | POA: Diagnosis not present

## 2021-04-03 DIAGNOSIS — I2699 Other pulmonary embolism without acute cor pulmonale: Secondary | ICD-10-CM | POA: Diagnosis not present

## 2021-04-03 DIAGNOSIS — I959 Hypotension, unspecified: Secondary | ICD-10-CM | POA: Diagnosis not present

## 2021-04-03 LAB — BASIC METABOLIC PANEL
Anion gap: 10 (ref 5–15)
BUN: 18 mg/dL (ref 8–23)
CO2: 24 mmol/L (ref 22–32)
Calcium: 8.6 mg/dL — ABNORMAL LOW (ref 8.9–10.3)
Chloride: 101 mmol/L (ref 98–111)
Creatinine, Ser: 0.9 mg/dL (ref 0.61–1.24)
GFR, Estimated: >60 mL/min (ref >60)
Glucose, Bld: 135 mg/dL — ABNORMAL HIGH (ref 70–99)
Potassium: 3.5 mmol/L (ref 3.5–5.1)
Sodium: 135 mmol/L (ref 135–145)

## 2021-04-03 LAB — CBC
HCT: 30 % — ABNORMAL LOW (ref 39.0–52.0)
Hemoglobin: 9.5 g/dL — ABNORMAL LOW (ref 13.0–17.0)
MCH: 30.4 pg (ref 26.0–34.0)
MCHC: 31.7 g/dL (ref 30.0–36.0)
MCV: 96.2 fL (ref 80.0–100.0)
Platelets: 207 10*3/uL (ref 150–400)
RBC: 3.12 MIL/uL — ABNORMAL LOW (ref 4.22–5.81)
RDW: 19.5 % — ABNORMAL HIGH (ref 11.5–15.5)
WBC: 14.9 10*3/uL — ABNORMAL HIGH (ref 4.0–10.5)
nRBC: 0 % (ref 0.0–0.2)

## 2021-04-03 LAB — LACTIC ACID, PLASMA: Lactic Acid, Venous: 1.9 mmol/L (ref 0.5–1.9)

## 2021-04-03 LAB — PROCALCITONIN: Procalcitonin: <0.1 ng/mL

## 2021-04-03 MED ORDER — FUROSEMIDE 10 MG/ML IJ SOLN
20.0000 mg | Freq: Every day | INTRAMUSCULAR | Status: DC
  Administered 2021-04-04: 20 mg via INTRAVENOUS
  Filled 2021-04-03: qty 2

## 2021-04-03 MED ORDER — ENSURE ENLIVE PO LIQD
237.0000 mL | Freq: Two times a day (BID) | ORAL | Status: DC
  Administered 2021-04-04 – 2021-04-07 (×5): 237 mL via ORAL

## 2021-04-03 MED ORDER — POLYETHYLENE GLYCOL 3350 17 G PO PACK
17.0000 g | PACK | Freq: Two times a day (BID) | ORAL | Status: DC
  Administered 2021-04-03 – 2021-04-05 (×4): 17 g via ORAL
  Filled 2021-04-03 (×5): qty 1

## 2021-04-03 NOTE — Progress Notes (Signed)
PROGRESS NOTE    [name]  UJW:[id number] DOB: 1931-02-24 DOA: 04/13/2021 PCP: Shirline Frees, MD   Brief Narrative: 87 year old with past medical history significant for right hip fracture status post ORIF with intramedullary nailing, urinary retention status post Foley catheter, myasthenia gravis on chronic Steroids, hypertension, BPH, dementia, essential tremor presents with hypotension.  Patient was recently discharged from the hospital 1 week ago after ORIF of the right hip.  Patient over the last 2 or 3 days developed right leg swelling and pain.  Doppler lower extremity was positive for DVT.  Patient was a started on Xarelto overnight.  The morning of admission patient was found to be in respiratory distress and an low blood pressure.  Patient was referred to the ED for further evaluation.  Patient was noted to have a rash on his left elbow.  Evaluation in the ED patient was noted to have borderline low blood pressure, hypoxia.  CTA positive for left lower lobe subsegmental PE.    Assessment & Plan:   Active Problems:   Pulmonary emboli (HCC)   Acute respiratory failure with hypoxia (HCC)   Hypotension   1-Subsegmental left lower lobe PE, DVT,  Acute Hypoxic Respiratory Failure.  Acute pulmonary edema secondary to acute diastolic HF exacerbation:  -Patient was started on heparin drip.  -He was  transition to [facility] 7/16. -Chest X-ray 7/15: Progressive interstitial and ground glass opacities consistent with worsening edema or infection.  -Started on IV lasix 7/15 to help with pulmonary edema.  -Increase oxygen requirement today, up to 6 L. Repeated chest x ray Showed: similar appearance favored to reflect severe multilobar PNA> \-Pulmonologist consulted.  -Plan to continue IV antibiotics and change lasix to daily.   2-Anemia;  Hb down to 7.6. No evidence of active bleeding.  Received one unit PRBC.  Prior HD; 7.9 July 5.  Hb stable.   3-Sepsis, 1/2 Blood culture  positive for Enterobacter Cloaceae.  Started on Cefepime.  Presents with leukocytosis, hypotension, left elbow cellulitis.  On IV steroid.  On antibiotics, Doxy. Started on Cefepime 7/14. Repeated Blood culture 7/67: pending.  WBC increase but patient is on IV steroids.   Abdominal pain;  On IV protonix.  KUB: mild ileus, Moderate stool in the colon including moderate retained feces at the rectum. Had BM 7/14 Had moderate BM 7/15. Continue with miralax, senna, PRN supp.   Chronic steroid use;  On chronic prednisone.  Continue with IV hydrocortisone.  Plan to resume prednisone 7/18  Hyponatremia:  Change diet to regular.  Received IV fluids.  Monitor.   Recent ORIF, Right Hip:  Ortho : Dr Victorino December.  Evaluated by Ortho continue with staples and dry dressing PRN  Chronic Indwelling Foley catheter:  Needs to follow up with urology.   Delirium;  Risperidone to BID PRN agitation.  Sitter ordered.    Estimated body mass index is 27.28 kg/m as calculated from the following:   Height as of this encounter: 5\' 7"  (1.702 m).   Weight as of this encounter: 79 kg.   DVT prophylaxis: Heparin gtt Code Status: Full code Family Communication: Daughter updated. 7/16 Disposition Plan:  Status is: Inpatient  Remains inpatient appropriate because:Hemodynamically unstable  Dispo: The patient is from: SNF              Anticipated d/c is to: SNF              Patient currently is not medically stable to d/c.   Difficult to place  patient No        Consultants:  None  Procedures:  ECHO P  Antimicrobials:    Subjective: He is singing, he denies dyspnea, denies abdominal pain. Per nurse he had small BM yesterday. Plan for suppo today    Objective: Vitals:   04/03/21 0600 04/03/21 0723 04/03/21 0827 04/03/21 1110  BP:   92/60 (!) 120/59  Pulse:   85 92  Resp: (!) 21  (!) 21 (!) 26  Temp:    98.4 F (36.9 C)  TempSrc:    Oral  SpO2: (!) 89% 95% 91% 95%   Weight:      Height:        Intake/Output Summary (Last 24 hours) at 04/03/2021 1223 Last data filed at 04/03/2021 1209 Gross per 24 hour  Intake 500 ml  Output 3450 ml  Net -2950 ml    Filed Weights   03/29/2021 1229 03/23/2021 2204  Weight: 73.5 kg 79 kg    Examination:  General exam: NAD Respiratory system: BL crackles.  Cardiovascular system: S 1, S2  RRR Gastrointestinal system: BS present, soft, nt Central nervous system: Alert, confuse Extremities: Symmetric 5 x 5 power. Dressing right hip, bruises.     Data Reviewed: I have personally reviewed following labs and imaging studies  CBC: Recent Labs  Lab 03/20/2021 1255 03/31/21 0057 03/31/21 1935 04/01/21 0532 04/02/21 0034 04/03/21 0032  WBC 18.0* 13.0*  --  12.2* 12.4* 14.9*  NEUTROABS 16.8*  --   --   --   --   --   HGB 9.1* 7.2* 8.9* 8.2* 8.9* 9.5*  HCT 28.8* 22.9* 27.5* 26.0* 28.0* 30.0*  MCV 104.0* 101.8*  --  98.1 95.9 96.2  PLT 231 196  --  199 201 811    Basic Metabolic Panel: Recent Labs  Lab 03/27/2021 1255 03/31/21 0057 04/01/21 0726 04/02/21 0034 04/03/21 0032  NA 131* 128* 134* 134* 135  K 4.2 3.9 3.8 3.3* 3.5  CL 100 99 105 103 101  CO2 24 22 19* 23 24  GLUCOSE 126* 148* 137* 144* 135*  BUN 20 21 16 16 18   CREATININE 1.15 1.13 0.93 0.91 0.90  CALCIUM 8.2* 8.1* 8.7* 8.6* 8.6*    GFR: Estimated Creatinine Clearance: 53 mL/min (by C-G formula based on SCr of 0.9 mg/dL). Liver Function Tests: Recent Labs  Lab 03/29/2021 1255  AST 20  ALT 15  ALKPHOS 92  BILITOT 1.3*  PROT 5.6*  ALBUMIN 2.5*    No results for input(s): LIPASE, AMYLASE in the last 168 hours. No results for input(s): AMMONIA in the last 168 hours. Coagulation Profile: Recent Labs  Lab 04/07/2021 1255  INR 2.6*    Cardiac Enzymes: No results for input(s): CKTOTAL, CKMB, CKMBINDEX, TROPONINI in the last 168 hours. BNP (last 3 results) No results for input(s): PROBNP in the last 8760 hours. HbA1C: No results  for input(s): HGBA1C in the last 72 hours. CBG: No results for input(s): GLUCAP in the last 168 hours. Lipid Profile: No results for input(s): CHOL, HDL, LDLCALC, TRIG, CHOLHDL, LDLDIRECT in the last 72 hours. Thyroid Function Tests: No results for input(s): TSH, T4TOTAL, FREET4, T3FREE, THYROIDAB in the last 72 hours. Anemia Panel: No results for input(s): VITAMINB12, FOLATE, FERRITIN, TIBC, IRON, RETICCTPCT in the last 72 hours.  Sepsis Labs: Recent Labs  Lab 03/22/2021 1255 03/31/21 1354 03/31/21 1935  LATICACIDVEN 1.2 3.6* 2.4*     Recent Results (from the past 240 hour(s))  Culture, blood (Routine x 2)  Status: Abnormal   Collection Time: 04/09/2021 12:55 PM   Specimen: BLOOD  Result Value Ref Range Status   Specimen Description BLOOD RIGHT ANTECUBITAL  Final   Special Requests   Final    BOTTLES DRAWN AEROBIC AND ANAEROBIC Blood Culture results may not be optimal due to an excessive volume of blood received in culture bottles   Culture  Setup Time   Final    GRAM NEGATIVE RODS AEROBIC BOTTLE ONLY CRITICAL RESULT CALLED TO, READ BACK BY AND VERIFIED WITHAndres Shad PHARMD 1327 03/31/21 A BROWNING Performed at [facility], [place] [address] [name], [place] [address]    Culture ENTEROBACTER CLOACAE (A)  Final   Report Status 04/02/2021 FINAL  Final   Organism ID, Bacteria ENTEROBACTER CLOACAE  Final      Susceptibility   Enterobacter cloacae - MIC*    CEFAZOLIN >=64 RESISTANT Resistant     CEFEPIME <=0.12 SENSITIVE Sensitive     CEFTAZIDIME <=1 SENSITIVE Sensitive     CIPROFLOXACIN <=0.25 SENSITIVE Sensitive     GENTAMICIN <=1 SENSITIVE Sensitive     IMIPENEM 1 SENSITIVE Sensitive     TRIMETH/SULFA >=320 RESISTANT Resistant     PIP/TAZO <=4 SENSITIVE Sensitive     * ENTEROBACTER CLOACAE  Blood Culture ID Panel (Reflexed)     Status: Abnormal   Collection Time: 04/06/2021 12:55 PM  Result Value Ref Range Status   Enterococcus faecalis NOT DETECTED NOT DETECTED  Final   Enterococcus Faecium NOT DETECTED NOT DETECTED Final   Listeria monocytogenes NOT DETECTED NOT DETECTED Final   Staphylococcus species NOT DETECTED NOT DETECTED Final   Staphylococcus aureus (BCID) NOT DETECTED NOT DETECTED Final   Staphylococcus epidermidis NOT DETECTED NOT DETECTED Final   Staphylococcus lugdunensis NOT DETECTED NOT DETECTED Final   Streptococcus species NOT DETECTED NOT DETECTED Final   Streptococcus agalactiae NOT DETECTED NOT DETECTED Final   Streptococcus pneumoniae NOT DETECTED NOT DETECTED Final   Streptococcus pyogenes NOT DETECTED NOT DETECTED Final   A.calcoaceticus-baumannii NOT DETECTED NOT DETECTED Final   Bacteroides fragilis NOT DETECTED NOT DETECTED Final   Enterobacterales DETECTED (A) NOT DETECTED Final    Comment: Enterobacterales represent a large order of gram negative bacteria, not a single organism.   Enterobacter cloacae complex DETECTED (A) NOT DETECTED Final    Comment: CRITICAL RESULT CALLED TO, READ BACK BY AND VERIFIED WITH: Andres Shad PHARMD 1327 03/31/21 A BROWNING    Escherichia coli NOT DETECTED NOT DETECTED Final   Klebsiella aerogenes NOT DETECTED NOT DETECTED Final   Klebsiella oxytoca NOT DETECTED NOT DETECTED Final   Klebsiella pneumoniae NOT DETECTED NOT DETECTED Final   Proteus species NOT DETECTED NOT DETECTED Final   Salmonella species NOT DETECTED NOT DETECTED Final   Serratia marcescens NOT DETECTED NOT DETECTED Final   Haemophilus influenzae NOT DETECTED NOT DETECTED Final   Neisseria meningitidis NOT DETECTED NOT DETECTED Final   Pseudomonas aeruginosa NOT DETECTED NOT DETECTED Final   Stenotrophomonas maltophilia NOT DETECTED NOT DETECTED Final   Candida albicans NOT DETECTED NOT DETECTED Final   Candida auris NOT DETECTED NOT DETECTED Final   Candida glabrata NOT DETECTED NOT DETECTED Final   Candida krusei NOT DETECTED NOT DETECTED Final   Candida parapsilosis NOT DETECTED NOT DETECTED Final   Candida tropicalis  NOT DETECTED NOT DETECTED Final   Cryptococcus neoformans/gattii NOT DETECTED NOT DETECTED Final   CTX-M ESBL NOT DETECTED NOT DETECTED Final   Carbapenem resistance IMP NOT DETECTED NOT DETECTED Final  Carbapenem resistance KPC NOT DETECTED NOT DETECTED Final   Carbapenem resistance NDM NOT DETECTED NOT DETECTED Final   Carbapenem resist OXA 48 LIKE NOT DETECTED NOT DETECTED Final   Carbapenem resistance VIM NOT DETECTED NOT DETECTED Final    Comment: Performed at [facility], [place] [address] [place], [place] [address]  Resp Panel by RT-PCR (Flu A&B, Covid) Nasopharyngeal Swab     Status: None   Collection Time: 03/21/2021  1:00 PM   Specimen: Nasopharyngeal Swab; Nasopharyngeal(NP) swabs in vial transport medium  Result Value Ref Range Status   SARS Coronavirus 2 by RT PCR NEGATIVE NEGATIVE Final    Comment: (NOTE) SARS-CoV-2 target nucleic acids are NOT DETECTED.  The SARS-CoV-2 RNA is generally detectable in upper respiratory specimens during the acute phase of infection. The lowest concentration of SARS-CoV-2 viral copies this assay can detect is 138 copies/mL. A negative result does not preclude SARS-Cov-2 infection and should not be used as the sole basis for treatment or other patient management decisions. A negative result may occur with  improper specimen collection/handling, submission of specimen other than nasopharyngeal swab, presence of viral mutation(s) within the areas targeted by this assay, and inadequate number of viral copies(<138 copies/mL). A negative result must be combined with clinical observations, patient history, and epidemiological information. The expected result is Negative.  Fact Sheet for Patients:  EntrepreneurPulse.com.au  Fact Sheet for Healthcare Providers:  IncredibleEmployment.be  This test is no t yet approved or cleared by the [place] FDA and  has been authorized for detection and/or  diagnosis of SARS-CoV-2 by FDA under an Emergency Use Authorization (EUA). This EUA will remain  in effect (meaning this test can be used) for the duration of the COVID-19 declaration under Section 564(b)(1) of the Act, 21 U.S.C.section 360bbb-3(b)(1), unless the authorization is terminated  or revoked sooner.       Influenza A by PCR NEGATIVE NEGATIVE Final   Influenza B by PCR NEGATIVE NEGATIVE Final    Comment: (NOTE) The Xpert Xpress SARS-CoV-2/FLU/RSV plus assay is intended as an aid in the diagnosis of influenza from Nasopharyngeal swab specimens and should not be used as a sole basis for treatment. Nasal washings and aspirates are unacceptable for Xpert Xpress SARS-CoV-2/FLU/RSV testing.  Fact Sheet for Patients: EntrepreneurPulse.com.au  Fact Sheet for Healthcare Providers: IncredibleEmployment.be  This test is not yet approved or cleared by the [place] FDA and has been authorized for detection and/or diagnosis of SARS-CoV-2 by FDA under an Emergency Use Authorization (EUA). This EUA will remain in effect (meaning this test can be used) for the duration of the COVID-19 declaration under Section 564(b)(1) of the Act, 21 U.S.C. section 360bbb-3(b)(1), unless the authorization is terminated or revoked.  Performed at [facility], [place] [address], [place], [place] [address]   Surgical pcr screen     Status: None   Collection Time: 03/27/2021  5:23 PM   Specimen: Nasal Mucosa; Nasal Swab  Result Value Ref Range Status   MRSA, PCR NEGATIVE NEGATIVE Final   Staphylococcus aureus NEGATIVE NEGATIVE Final    Comment: (NOTE) The Xpert SA Assay (FDA approved for NASAL specimens in patients 66 years of age and older), is one component of a comprehensive surveillance program. It is not intended to diagnose infection nor to guide or monitor treatment. Performed at [facility], [place] [address] [place], [place] [address]    Culture, blood (Routine x 2)     Status: None (Preliminary result)  Collection Time: 04/01/2021 10:53 PM   Specimen: BLOOD  Result Value Ref Range Status   Specimen Description BLOOD LEFT ANTECUBITAL  Final   Special Requests   Final    BOTTLES DRAWN AEROBIC ONLY Blood Culture adequate volume   Culture   Final    NO GROWTH 3 DAYS Performed at [facility], [address] [place], [place] [address]    Report Status PENDING  Incomplete  Urine Culture     Status: None   Collection Time: 03/31/21  6:33 AM   Specimen: Urine, Random  Result Value Ref Range Status   Specimen Description URINE, RANDOM  Final   Special Requests NONE  Final   Culture   Final    NO GROWTH Performed at [facility], [address] [place], [place] [address]    Report Status 04/02/2021 FINAL  Final          Radiology Studies: DG Abd 1 View  Result Date: 04/01/2021 CLINICAL DATA:  Abdomen pain EXAM: ABDOMEN - 1 VIEW COMPARISON:  CT 11/23/2008 FINDINGS: Mild diffuse increased bowel gas throughout. Moderate stool in the colon including moderate impacted feces at the rectum. No radiopaque calculi. Scoliosis of the spine. Hardware in the right hip. IMPRESSION: 1. Mild diffuse increased bowel gas throughout, question mild ileus. 2. Moderate stool in the colon including moderate retained feces at the rectum. Electronically Signed   By: Donavan Foil M.D.   On: 04/01/2021 15:54   DG CHEST PORT 1 VIEW  Result Date: 04/03/2021 CLINICAL DATA:  87 year old male with history of hypoxemia. Hypertension. EXAM: PORTABLE CHEST 1 VIEW COMPARISON:  Chest x-ray 04/01/2021. FINDINGS: Lung volumes are low. Patchy multifocal airspace consolidation widespread areas of interstitial prominence are again noted throughout the lungs bilaterally. Emphysematous changes are also noted. Small left pleural effusion. No definite right pleural effusion. No pneumothorax. Cardiac silhouette is partially obscured, but heart size  appears normal. Upper mediastinal contours are within normal limits. Atherosclerotic calcifications in the thoracic aorta. Left-sided pacemaker device in place with lead tips projecting over the expected location of the right atrium and right ventricle. IMPRESSION: 1. The appearance the chest is very similar to recent prior studies, favored to reflect severe multilobar bilateral pneumonia (left greater than right). 2. Small left pleural effusion. 3. Aortic atherosclerosis. Electronically Signed   By: Vinnie Langton M.D.   On: 04/03/2021 08:40   DG CHEST PORT 1 VIEW  Result Date: 04/01/2021 CLINICAL DATA:  Short of breath, hypoxemia EXAM: PORTABLE CHEST 1 VIEW COMPARISON:  03/31/2021 FINDINGS: Single frontal view of the chest demonstrates stable dual lead pacer. Cardiac silhouette is unchanged. There has been progression of the interstitial and ground-glass opacity seen previously, with relative sparing of the right upper lung zone. Trace left pleural effusion unchanged. No pneumothorax. IMPRESSION: 1. Progressive interstitial and ground-glass opacities consistent with worsening edema or infection. 2. Stable trace left pleural effusion. Electronically Signed   By: Randa Ngo M.D.   On: 04/01/2021 17:46        Scheduled Meds:  albuterol  2.5 mg Nebulization Q6H   Chlorhexidine Gluconate Cloth  6 each Topical Daily   docusate sodium  100 mg Oral BID   doxycycline  100 mg Oral Q12H   finasteride  5 mg Oral q morning   [START ON 04/04/2021] furosemide  20 mg Intravenous Daily   hydrocortisone sod succinate (SOLU-CORTEF) inj  50 mg Intravenous Q12H   neomycin-bacitracin-polymyxin   Topical See admin instructions  pantoprazole (PROTONIX) IV  40 mg Intravenous Q24H   polyethylene glycol  17 g Oral Daily   potassium chloride  40 mEq Oral BID   pyridostigmine  60 mg Oral 4 times per day   Rivaroxaban  15 mg Oral BID WC   Followed by   Derrill Memo ON 04/20/2021] rivaroxaban  20 mg Oral Q supper   senna   1 tablet Oral Daily   sodium phosphate  1 enema Rectal Once   tamsulosin  0.4 mg Oral Daily   Continuous Infusions:  ceFEPime (MAXIPIME) IV 2 g (04/03/21 1001)     LOS: 4 days    Time spent: 35 minutes,.     Elmarie Shiley, MD [facility]   If 7PM-7AM, please contact night-coverage [email or web address]  04/03/2021, 12:23 PM

## 2021-04-03 NOTE — Consult Note (Addendum)
NAME:  [name], MRN:  [id number], DOB:  [birth year], LOS: 4 ADMISSION DATE:  04/06/2021, CONSULTATION DATE:  7/14 REFERRING MD:  Dr. Tyrell Antonio, CHIEF COMPLAINT:  hypotension; PE   History of Present Illness:  Patient is a 87 yo M w/ PMH of R hip fracture post ORIF 1 week ago, chronic foley catheter, myasthenia gravis on chronic steroid, HTN, BPH, dementia presents to [facility] on 7/13 with hypotension.  Patient was discharged from [facility] 1 week ago after ORIF of right hip. Discharged on aspirin and plavix. Patient developed right leg swelling and pain around 7/9. On 7/12, DVT ultrasound positive for fight leg DVT. Started on Xarelto.  On 7/14, patient was hypotensive and in respiratory distress at his rehab facility and was admitted to [facility]. Denies cough, fever, chills.  ED course: BP borderline low, hypoxia resolved on 2 l/m, CTA positive for LLL subsegmental PE. WBC 18, Na 126.  PCCM consulted on 7/14 to evaluate management for hypotension and hypoxemia.  Reconsulted on 7/17 for transient hypotension and increased work of breathing on 6 L/min.  Has been on anticoagulation, heparin was changed to Xarelto 7/16.  Found to have Enterobacter bacteremia and treated with doxycycline plus cefepime, has been on stress dose steroids with hydrocortisone given his chronic prednisone use.  Follow-up blood cultures 7/16 still negative.  CXR possible pneumonia versus pulmonary edema.  Oxygen requirement increased to 6 L/min on 7/16, has received diuretics.  I/O- 3.9 L total.   Pertinent  Medical History   Past Medical History:  Diagnosis Date   Carotid artery disease ([place])    Coronary artery disease    PCI RCA 2009   Dyslipidemia    Hypertension    Scleritis, unspecified    treated with prednisone   Seizure ([place])    felt due to cyclosporin in setting of electrolyte disorder   Solitary kidney    Symptomatic bradycardia    a. s/p STJ dual chamber pacemaker     Significant Hospital Events: Including  procedures, antibiotic start and stop dates in addition to other pertinent events   7/13: patient admitted to [facility] for hypotension and PE 7/14: PCCM consulted for hypotension. Responding to IV fluids.  7/17 PCCM consult for transient hypotension, increased work of breathing on 6 L/min  Interim History / Subjective:  No complaints, interacts appropriately on 6 L/min (an increase over the last 48 hours) Tolerating p.o. diet Currently hemodynamically stable  Objective   Blood pressure (!) 120/59, pulse 92, temperature 98.4 F (36.9 C), temperature source Oral, resp. rate (!) 26, height 5\' 7"  (1.702 m), weight 79 kg, SpO2 95 %.    FiO2 (%):  [32 %-44 %] 44 %   Intake/Output Summary (Last 24 hours) at 04/03/2021 1144 Last data filed at 04/03/2021 0300 Gross per 24 hour  Intake 500 ml  Output 2550 ml  Net -2050 ml   Filed Weights   03/26/2021 1229 03/25/2021 2204  Weight: 73.5 kg 79 kg    Examination: General: Elderly chronically ill-appearing man, no distress HEENT: Oropharynx clear, mucosa somewhat pale, moist.  Pupils equal Neuro: Awake, alert, interacting appropriately.  Answers questions, follows commands CV: Distant, regular, no murmur PULM: Decreased at both bases with some bilateral inspiratory crackles.  No wheezing. GI: Nondistended, positive bowel sounds   Resolved Hospital Problem list     Assessment & Plan:   Sepsis due to Enterobacter bacteremia and possible pneumonia Enterobacter bacteremia Bilateral pulmonary infiltrates, question pneumonia P: -Hemodynamically stable currently, has had some  transient lows in the setting of infection (treating) and also diuresis.  Now net negative almost 4 L, may need to back off on his diuretics -Agree with cefepime and doxycycline -Check procalcitonin and trend lactic acid -Agree with stress dose steroids, weaning.  Can likely transition back to prednisone 20 mg on 7/18  Acute PE: CTA 7/13 occlusive PE LLL with no signs of heart  strain; Echo reassuring DVT: started on Xarelto 3 days prior to admission P: -Now back on Xarelto -Follow CBC to ensure no evidence for occult blood loss on anticoagulation  Acute hypoxic resp failure: Multifactorial.  Pulmonary embolism, bilateral pulmonary infiltrates superimposed on interstitial disease.  Consider component of restrictive physiology due to respiratory muscle weakness given his myasthenia gravis, overall debilitated state due to ORIF, hospitalization.  CT chest 7/13: shows chronic interstitial lung disease.  Subsequent chest x-ray with evolving bibasilar infiltrates superimposed on his ILD.  Consider pneumonia as above, consider atelectasis P: -Push pulmonary hygiene -Wean oxygen as able for SPO2 > 90% -Anticoagulation as above -Antibiotics as above -follow CXR today and am  MG, chronic steroid use, hyponatremia, recent orif P: -Steroids and Mestinon   Best Practice (right click and "Reselect all SmartList Selections" daily)   Diet/type: Regular consistency (see orders) DVT prophylaxis: DOAC GI prophylaxis: N/A Lines: N/A Foley:  Yes, and it is still needed Code Status:  full code Last date of multidisciplinary goals of care discussion [per primary]  Labs   CBC: Recent Labs  Lab 03/29/2021 1255 03/31/21 0057 03/31/21 1935 04/01/21 0532 04/02/21 0034 04/03/21 0032  WBC 18.0* 13.0*  --  12.2* 12.4* 14.9*  NEUTROABS 16.8*  --   --   --   --   --   HGB 9.1* 7.2* 8.9* 8.2* 8.9* 9.5*  HCT 28.8* 22.9* 27.5* 26.0* 28.0* 30.0*  MCV 104.0* 101.8*  --  98.1 95.9 96.2  PLT 231 196  --  199 201 759    Basic Metabolic Panel: Recent Labs  Lab 04/17/2021 1255 03/31/21 0057 04/01/21 0726 04/02/21 0034 04/03/21 0032  NA 131* 128* 134* 134* 135  K 4.2 3.9 3.8 3.3* 3.5  CL 100 99 105 103 101  CO2 24 22 19* 23 24  GLUCOSE 126* 148* 137* 144* 135*  BUN 20 21 16 16 18   CREATININE 1.15 1.13 0.93 0.91 0.90  CALCIUM 8.2* 8.1* 8.7* 8.6* 8.6*   GFR: Estimated  Creatinine Clearance: 53 mL/min (by C-G formula based on SCr of 0.9 mg/dL). Recent Labs  Lab 03/20/2021 1255 03/31/21 0057 03/31/21 1354 03/31/21 1935 04/01/21 0532 04/02/21 0034 04/03/21 0032  WBC 18.0* 13.0*  --   --  12.2* 12.4* 14.9*  LATICACIDVEN 1.2  --  3.6* 2.4*  --   --   --     Liver Function Tests: Recent Labs  Lab 04/07/2021 1255  AST 20  ALT 15  ALKPHOS 92  BILITOT 1.3*  PROT 5.6*  ALBUMIN 2.5*   No results for input(s): LIPASE, AMYLASE in the last 168 hours. No results for input(s): AMMONIA in the last 168 hours.  ABG    Component Value Date/Time   HCO3 27.6 08/26/2020 2208   TCO2 29 08/26/2020 2208   O2SAT 99.0 08/26/2020 2208     Coagulation Profile: Recent Labs  Lab 04/16/2021 1255  INR 2.6*    Cardiac Enzymes: No results for input(s): CKTOTAL, CKMB, CKMBINDEX, TROPONINI in the last 168 hours.  HbA1C: Hgb A1c MFr Bld  Date/Time Value Ref Range Status  03/19/2021 03:34 AM 5.9 (H) 4.8 - 5.6 % Final    Comment:    (NOTE) Pre diabetes:          5.7%-6.4%  Diabetes:              >6.4%  Glycemic control for   <7.0% adults with diabetes   05/13/2020 06:44 AM 6.1 (H) 4.8 - 5.6 % Final    Comment:    (NOTE) Pre diabetes:          5.7%-6.4%  Diabetes:              >6.4%  Glycemic control for   <7.0% adults with diabetes     CBG: No results for input(s): GLUCAP in the last 168 hours.  Review of Systems:   See hpi  Past Medical History:  He,  has a past medical history of Carotid artery disease ([place]), Coronary artery disease, Dyslipidemia, Hypertension, Scleritis, unspecified, Seizure ([place]), Solitary kidney, and Symptomatic bradycardia.   Surgical History:   Past Surgical History:  Procedure Laterality Date   CARDIAC CATHETERIZATION     12 years ago?   COLONOSCOPY     EYE SURGERY     right eye about 10 years ago   INTRAMEDULLARY (IM) NAIL INTERTROCHANTERIC Right 03/19/2021   Procedure: INTRAMEDULLARY (IM) NAIL ANTERIOR  TROCHANTRIC;  Surgeon: Nicholes Stairs, MD;  Location: [facility];  Service: Orthopedics;  Laterality: Right;   ORIF HUMERUS FRACTURE Right 07/03/2019   Procedure: Right distal humerus open reduction, internal fixation;  Surgeon: Verner Mould, MD;  Location: [facility];  Service: Orthopedics;  Laterality: Right;   PACEMAKER INSERTION  2009   STJ dual chamber pacemaker implanted by Dr Olevia Perches for symptomatic bradycardia     Social History:   reports that he has never smoked. He has never used smokeless tobacco. He reports current alcohol use. He reports that he does not use drugs.   Family History:  His family history includes Alcohol abuse in his son; Arthritis/Rheumatoid in his mother; COPD in his father; Heart disease in his mother.   Allergies Allergies  Allergen Reactions   Cefuroxime Axetil Other (See Comments)    Reaction not recalled   Cyclosporine Other (See Comments)    Reaction not recalled   Elemental Sulfur Other (See Comments)    No energy, could not walk    Maxzide [Triamterene-Hctz] Other (See Comments)    Adverse reaction- bloating and abdominal pain   Sulfa Antibiotics Other (See Comments)    Could not sleep/eat and experienced gastric pain   Sulfamethoxazole Other (See Comments)    Could not sleep/eat and had gastric pain   Sulfamethoxazole-Trimethoprim Other (See Comments)    Gastric pain and could not eat/sleep   Prednisone Other (See Comments)    Can tolerate for a short period of time     Home Medications  Prior to Admission medications   Medication Sig Start Date End Date Taking? Authorizing Provider  acetaminophen (TYLENOL) 325 MG tablet Take 650 mg by mouth every 6 (six) hours as needed for mild pain or fever.   Yes [provider]  amLODipine (NORVASC) 2.5 MG tablet Take 2.5 mg by mouth every morning.   Yes [provider]  carvedilol (COREG) 3.125 MG tablet Take 1 tablet (3.125 mg total) by mouth 2 (two) times daily. 03/23/21  Yes  Shelly Coss, MD  Dextromethorphan-guaiFENesin 10-100 MG/5ML liquid Take 10 mLs by mouth every 4 (four) hours as needed (cough/congestion).   Yes  [provider]  docusate sodium (COLACE) 100 MG capsule Take 1 capsule (100 mg total) by mouth 2 (two) times daily. 03/23/21  Yes Shelly Coss, MD  finasteride (PROSCAR) 5 MG tablet Take 5 mg by mouth every morning.   Yes [provider]  LORazepam (ATIVAN) 0.5 MG tablet Take 1 tablet (0.5 mg total) by mouth at bedtime as needed (insomnia). 03/22/21  Yes Shelly Coss, MD  methocarbamol (ROBAXIN) 500 MG tablet Take 1 tablet (500 mg total) by mouth every 6 (six) hours as needed for muscle spasms. 03/23/21  Yes Shelly Coss, MD  Neomycin-Bacitracin-Polymyxin (TRIPLE ANTIBIOTIC) OINT Apply 1 application topically See admin instructions. Apply small amount of triple antibiotic ointment to non-stick pad and cover wound, wrap with coban until healed   Yes [provider]  NITROSTAT 0.4 MG SL tablet DISSOLVE ONE TABLET UNDER THE TONGUE EVERY 5 MINUTES AS NEEDED FOR CHEST PAIN.  DO NOT EXCEED A TOTAL OF 3 DOSES IN 15 MINUTES Patient taking differently: Place 0.4 mg under the tongue every 5 (five) minutes x 3 doses as needed for chest pain. 11/19/18  Yes Fay Records, MD  ondansetron (ZOFRAN) 4 MG tablet Take 4 mg by mouth every 8 (eight) hours as needed for nausea or vomiting.   Yes [provider]  oxyCODONE (OXY IR/ROXICODONE) 5 MG immediate release tablet Take 1 tablet (5 mg total) by mouth every 6 (six) hours as needed for severe pain. 03/22/21  Yes Shelly Coss, MD  predniSONE (DELTASONE) 20 MG tablet Take 1 tablet (20 mg total) by mouth daily with breakfast. 08/16/20  Yes Patel, Donika K, DO  pyridostigmine (MESTINON) 60 MG tablet Take 1 tablet at 7am, 10am 1pm, and 5pm Patient taking differently: Take 60 mg by mouth See admin instructions. Take one tablet (60 mg) by mouth four times daily - 6:30am, 10am, 1pm and 5pm  07/16/20  Yes Patel, Donika K, DO  risperiDONE (RISPERDAL) 0.5 MG tablet Take 1 tablet (0.5 mg total) by mouth at bedtime as needed (agitation). 03/23/21  Yes Shelly Coss, MD  tamsulosin (FLOMAX) 0.4 MG CAPS capsule Take 1 capsule (0.4 mg total) by mouth daily. 03/23/21  Yes Adhikari, Tamsen Meek, MD  XARELTO 15 MG TABS tablet Take 15 mg by mouth 2 (two) times daily. 03/29/21 04/19/21 Yes [provider]  Ensure (ENSURE) Take 237 mLs by mouth daily as needed (meal refusal).    [provider]  Nutritional Supplements (NUTRITIONAL SUPPLEMENT PO) Take 1 Can by mouth 2 (two) times daily. Muscle Milk (provided by family)    [provider]     Critical care time: 67 min     Baltazar Apo, MD, PhD 04/03/2021, 12:05 PM [facility] [phone] or if no answer before 7:00PM call [phone] For any issues after 7:00PM please call eLink [phone]

## 2021-04-04 ENCOUNTER — Inpatient Hospital Stay (HOSPITAL_COMMUNITY): Payer: Medicare Other

## 2021-04-04 ENCOUNTER — Ambulatory Visit: Payer: Medicare Other | Admitting: Neurology

## 2021-04-04 DIAGNOSIS — A419 Sepsis, unspecified organism: Secondary | ICD-10-CM | POA: Diagnosis not present

## 2021-04-04 DIAGNOSIS — R7881 Bacteremia: Secondary | ICD-10-CM | POA: Diagnosis not present

## 2021-04-04 DIAGNOSIS — I2699 Other pulmonary embolism without acute cor pulmonale: Secondary | ICD-10-CM | POA: Diagnosis not present

## 2021-04-04 DIAGNOSIS — R6520 Severe sepsis without septic shock: Secondary | ICD-10-CM

## 2021-04-04 DIAGNOSIS — J9601 Acute respiratory failure with hypoxia: Secondary | ICD-10-CM | POA: Diagnosis not present

## 2021-04-04 DIAGNOSIS — L03114 Cellulitis of left upper limb: Secondary | ICD-10-CM | POA: Diagnosis not present

## 2021-04-04 LAB — BASIC METABOLIC PANEL
Anion gap: 10 (ref 5–15)
BUN: 25 mg/dL — ABNORMAL HIGH (ref 8–23)
CO2: 25 mmol/L (ref 22–32)
Calcium: 8.9 mg/dL (ref 8.9–10.3)
Chloride: 101 mmol/L (ref 98–111)
Creatinine, Ser: 1 mg/dL (ref 0.61–1.24)
GFR, Estimated: >60 mL/min (ref >60)
Glucose, Bld: 122 mg/dL — ABNORMAL HIGH (ref 70–99)
Potassium: 3.7 mmol/L (ref 3.5–5.1)
Sodium: 136 mmol/L (ref 135–145)

## 2021-04-04 LAB — CBC
HCT: 31.8 % — ABNORMAL LOW (ref 39.0–52.0)
Hemoglobin: 9.9 g/dL — ABNORMAL LOW (ref 13.0–17.0)
MCH: 30.6 pg (ref 26.0–34.0)
MCHC: 31.1 g/dL (ref 30.0–36.0)
MCV: 98.1 fL (ref 80.0–100.0)
Platelets: 226 10*3/uL (ref 150–400)
RBC: 3.24 MIL/uL — ABNORMAL LOW (ref 4.22–5.81)
RDW: 19.8 % — ABNORMAL HIGH (ref 11.5–15.5)
WBC: 14.9 10*3/uL — ABNORMAL HIGH (ref 4.0–10.5)
nRBC: 0 % (ref 0.0–0.2)

## 2021-04-04 LAB — CULTURE, BLOOD (ROUTINE X 2)
Culture: NO GROWTH
Special Requests: ADEQUATE

## 2021-04-04 LAB — PROCALCITONIN: Procalcitonin: 0.12 ng/mL

## 2021-04-04 LAB — LACTIC ACID, PLASMA: Lactic Acid, Venous: 2.1 mmol/L (ref 0.5–1.9)

## 2021-04-04 MED ORDER — ALBUTEROL SULFATE (2.5 MG/3ML) 0.083% IN NEBU
2.5000 mg | INHALATION_SOLUTION | Freq: Three times a day (TID) | RESPIRATORY_TRACT | Status: DC
  Administered 2021-04-04 – 2021-04-07 (×8): 2.5 mg via RESPIRATORY_TRACT
  Filled 2021-04-04 (×11): qty 3

## 2021-04-04 MED ORDER — PREDNISONE 20 MG PO TABS
20.0000 mg | ORAL_TABLET | Freq: Every day | ORAL | Status: DC
  Administered 2021-04-05 – 2021-04-06 (×2): 20 mg via ORAL
  Filled 2021-04-04 (×2): qty 1

## 2021-04-04 MED ORDER — PANTOPRAZOLE SODIUM 40 MG PO TBEC
40.0000 mg | DELAYED_RELEASE_TABLET | Freq: Every day | ORAL | Status: DC
  Administered 2021-04-04 – 2021-04-06 (×3): 40 mg via ORAL
  Filled 2021-04-04 (×3): qty 1

## 2021-04-04 MED ORDER — SULFAMETHOXAZOLE-TRIMETHOPRIM 400-80 MG/5ML IV SOLN
400.0000 mg | Freq: Four times a day (QID) | INTRAVENOUS | Status: DC
  Administered 2021-04-04 – 2021-04-06 (×8): 400 mg via INTRAVENOUS
  Filled 2021-04-04 (×10): qty 25

## 2021-04-04 NOTE — Consult Note (Signed)
   [facility] CM Inpatient Consult   04/04/2021  MUHAMMAD VACCA 12-13-1930 [id number]  [facility] [ACO] Patient: Medicare CMS DCE  Primary Care Provider:  Shirline Frees, MD, [facility] Physician, [facility]  Patient screened for less than 7 days readmission hospitalization with noted extreme high risk score for unplanned readmission risk and  also to assess for potential [facility] service needs for post hospital transition.  Review of patient's medical record reveals patient is being recommended for a skilled nursing level of care.  Plan:  Continue to follow progress and disposition to assess for post hospital care management needs.    For questions contact:   Natividad Brood, RN BSN [facility] Liaison  [phone] business mobile phone Toll free office [phone]  Fax number: [phone] [email or web address]

## 2021-04-04 NOTE — Consult Note (Addendum)
[name]    Date of Admission:  04/11/2021     Reason for Consult: enterobacter bacteremia    Referring Provider: Tyrell Antonio   Lines:  Peripheral iv's  Abx: 7/14-c cefepime 7/14-c doxy        Assessment: Enterobacter bacteremia Severe sepsis Dvt/PE Hypoxemic resp distress   87 yo male s/p recent 7/02 ORIF for right hip fx, copd/pulm fibrosis, presence of pacemaker, chronic foley for bladder outlet obstruction & urinary retention, myasthenia gravis on chronic steroid, dementia, copd/pulm fibrosis, presence of pacemaker, admitted 7/13 with severe sepsis found and hypxemic respiratory distress to have enterobacter bacteremia, RLE DVT/subsegmental PE started on anticoagulation this admission  7/13 bcx enterobacter cloacae (S cefepime, cipro; R cefazolin, bactrim). Unclear source of the enterobacter cloacae. Urine culture was negative. Has been on abx for several days. No fever here. Mild leukocytosis in setting of chronic steroid. Chronic foley not yet changed this admission.   Gram negative bsi low risk pacemaker involvement. Tte no obvious veg. Given his MG/morbidity, with prompt resolution of sepsis, would defer tee. If recurrent bacteremia reasonable to w/u for lead infection then  No issue with orif site. I doubt infection complication from enterobacter bsi  Myasthenia gravis on chronic steroid  Hypoxemic resp distress in setting sepsis, copd/pulm fibrosis, PE and perhaps worsening myasthenia gravis. No obvious sx of pna, but team treating as such. Reasonable. Of note, patient has been on moderate dose chronic steroid, so reasonble to w/u pjp if hypoxemia is worse (per team yes as of 7/18)  Plan: Please change out foley Only PO option antibiotics to change him to is ciprofloxacin, however this can exacerbate myasthenia gravis Defer tee for now; if recurrent enterobacter bsi would need tee Check fungitel Please have RT induced sputum  for pjp cytology If worsening o2 still, will need pjp treatment Keep on IV cefepime until 7/21 to finish 7 days antibiotics Reasonable to finish 7 day pna treatment with doxy/cefepime as well same end date as above If chronic prednisone is > 15 mg/day consider PJP prophylaxis Monitor for MG exacerbation Rest of management per primary team   Discussed with primary team   I spent 60 minute reviewing data/chart, and coordinating care and >50% direct face to face time providing counseling/discussing diagnostics/treatment plan with patient      ------------------------------------------------ Active Problems:   Pulmonary emboli (HCC)   Acute respiratory failure with hypoxia ([place])   Hypotension    HPI: [name] is a 87 y.o. male recent orif right hip 7/02, admitted with hypoxic respiratory failure also found to have gn sepsis and pe/dvt  Patient doing well post op, but developed 2-3 days dyspnea so came for admission with finding of dvt/pe  There was relative hypotension on presentation. Bcx 1 of 2 set positive for enterobacter cloacae.   Cxr/chest imaging old fibrotic changes and copd changes  No fever Mild leukocytosis stable despite on abx. Primary team also have him on doxy for copd  No increased right hip surgical site pain Has chronic foley not yet changed  Myasthienia gravis controlled with prednisone and on mestinon.     Family History  Problem Relation Age of Onset   Arthritis/Rheumatoid Mother    Heart disease Mother    COPD Father    Alcohol abuse Son     Social History   Tobacco Use   Smoking status: Never   Smokeless tobacco: Never  Vaping Use   Vaping  Use: Never used  Substance Use Topics   Alcohol use: Yes    Comment: rarely   Drug use: No    Allergies  Allergen Reactions   Cefuroxime Axetil Other (See Comments)    Reaction not recalled   Cyclosporine Other (See Comments)    Reaction not recalled   Elemental Sulfur Other (See  Comments)    No energy, could not walk    Maxzide [Triamterene-Hctz] Other (See Comments)    Adverse reaction- bloating and abdominal pain   Sulfa Antibiotics Other (See Comments)    Could not sleep/eat and experienced gastric pain   Sulfamethoxazole Other (See Comments)    Could not sleep/eat and had gastric pain   Sulfamethoxazole-Trimethoprim Other (See Comments)    Gastric pain and could not eat/sleep   Prednisone Other (See Comments)    Can tolerate for a short period of time    Review of Systems: ROS All Other ROS was negative, except mentioned above   Past Medical History:  Diagnosis Date   Carotid artery disease ([place])    Coronary artery disease    PCI RCA 2009   Dyslipidemia    Hypertension    Scleritis, unspecified    treated with prednisone   Seizure ([place])    felt due to cyclosporin in setting of electrolyte disorder   Solitary kidney    Symptomatic bradycardia    a. s/p STJ dual chamber pacemaker       Scheduled Meds:  albuterol  2.5 mg Nebulization TID   Chlorhexidine Gluconate Cloth  6 each Topical Daily   docusate sodium  100 mg Oral BID   doxycycline  100 mg Oral Q12H   feeding supplement  237 mL Oral BID BM   finasteride  5 mg Oral q morning   furosemide  20 mg Intravenous Daily   hydrocortisone sod succinate (SOLU-CORTEF) inj  50 mg Intravenous Q12H   neomycin-bacitracin-polymyxin   Topical See admin instructions   pantoprazole (PROTONIX) IV  40 mg Intravenous Q24H   polyethylene glycol  17 g Oral BID   potassium chloride  40 mEq Oral BID   pyridostigmine  60 mg Oral 4 times per day   Rivaroxaban  15 mg Oral BID WC   Followed by   Derrill Memo ON 04/20/2021] rivaroxaban  20 mg Oral Q supper   senna  1 tablet Oral Daily   tamsulosin  0.4 mg Oral Daily   Continuous Infusions:  ceFEPime (MAXIPIME) IV 2 g (04/04/21 0954)   PRN Meds:.acetaminophen, alum & mag hydroxide-simeth, bisacodyl, guaiFENesin-dextromethorphan, LORazepam, methocarbamol,  ondansetron, oxyCODONE, risperiDONE   OBJECTIVE: Blood pressure (!) 120/57, pulse 80, temperature 98.7 F (37.1 C), temperature source Oral, resp. rate 20, height 5\' 7"  (1.702 m), weight 79 kg, SpO2 95 %.  Physical Exam General/constitutional: no distress, pleasant; on 15 L hfnc HEENT: Normocephalic, PER, Conj Clear, EOMI, Oropharynx clear Neck supple CV: rrr no mrg; left chest pacer site nontender/nonerythematous Lungs: clear to auscultation, normal respiratory effort Abd: Soft, Nontender Ext: no edema Skin: right hip dressing c/d/I, surrounding skin echymotic Neuro: nonfocal MSK: no peripheral joint swelling/tenderness/warmth; back spines nontender      Lab Results Lab Results  Component Value Date   WBC 14.9 (H) 04/04/2021   HGB 9.9 (L) 04/04/2021   HCT 31.8 (L) 04/04/2021   MCV 98.1 04/04/2021   PLT 226 04/04/2021    Lab Results  Component Value Date   CREATININE 1.00 04/04/2021   BUN 25 (H) 04/04/2021   NA 136  04/04/2021   K 3.7 04/04/2021   CL 101 04/04/2021   CO2 25 04/04/2021    Lab Results  Component Value Date   ALT 15 04/17/2021   AST 20 03/29/2021   ALKPHOS 92 04/01/2021   BILITOT 1.3 (H) 03/24/2021      Microbiology: Recent Results (from the past 240 hour(s))  Culture, blood (Routine x 2)     Status: Abnormal   Collection Time: 04/02/2021 12:55 PM   Specimen: BLOOD  Result Value Ref Range Status   Specimen Description BLOOD RIGHT ANTECUBITAL  Final   Special Requests   Final    BOTTLES DRAWN AEROBIC AND ANAEROBIC Blood Culture results may not be optimal due to an excessive volume of blood received in culture bottles   Culture  Setup Time   Final    GRAM NEGATIVE RODS AEROBIC BOTTLE ONLY CRITICAL RESULT CALLED TO, READ BACK BY AND VERIFIED WITHAndres Shad PHARMD 1327 03/31/21 A BROWNING Performed at [facility], [place] [address] [place], [place] [address]    Culture ENTEROBACTER CLOACAE (A)  Final   Report Status 04/02/2021 FINAL  Final    Organism ID, Bacteria ENTEROBACTER CLOACAE  Final      Susceptibility   Enterobacter cloacae - MIC*    CEFAZOLIN >=64 RESISTANT Resistant     CEFEPIME <=0.12 SENSITIVE Sensitive     CEFTAZIDIME <=1 SENSITIVE Sensitive     CIPROFLOXACIN <=0.25 SENSITIVE Sensitive     GENTAMICIN <=1 SENSITIVE Sensitive     IMIPENEM 1 SENSITIVE Sensitive     TRIMETH/SULFA >=320 RESISTANT Resistant     PIP/TAZO <=4 SENSITIVE Sensitive     * ENTEROBACTER CLOACAE  Blood Culture ID Panel (Reflexed)     Status: Abnormal   Collection Time: 03/26/2021 12:55 PM  Result Value Ref Range Status   Enterococcus faecalis NOT DETECTED NOT DETECTED Final   Enterococcus Faecium NOT DETECTED NOT DETECTED Final   Listeria monocytogenes NOT DETECTED NOT DETECTED Final   Staphylococcus species NOT DETECTED NOT DETECTED Final   Staphylococcus aureus (BCID) NOT DETECTED NOT DETECTED Final   Staphylococcus epidermidis NOT DETECTED NOT DETECTED Final   Staphylococcus lugdunensis NOT DETECTED NOT DETECTED Final   Streptococcus species NOT DETECTED NOT DETECTED Final   Streptococcus agalactiae NOT DETECTED NOT DETECTED Final   Streptococcus pneumoniae NOT DETECTED NOT DETECTED Final   Streptococcus pyogenes NOT DETECTED NOT DETECTED Final   A.calcoaceticus-baumannii NOT DETECTED NOT DETECTED Final   Bacteroides fragilis NOT DETECTED NOT DETECTED Final   Enterobacterales DETECTED (A) NOT DETECTED Final    Comment: Enterobacterales represent a large order of gram negative bacteria, not a single organism.   Enterobacter cloacae complex DETECTED (A) NOT DETECTED Final    Comment: CRITICAL RESULT CALLED TO, READ BACK BY AND VERIFIED WITH: Andres Shad PHARMD 1327 03/31/21 A BROWNING    Escherichia coli NOT DETECTED NOT DETECTED Final   Klebsiella aerogenes NOT DETECTED NOT DETECTED Final   Klebsiella oxytoca NOT DETECTED NOT DETECTED Final   Klebsiella pneumoniae NOT DETECTED NOT DETECTED Final   Proteus species NOT DETECTED NOT  DETECTED Final   Salmonella species NOT DETECTED NOT DETECTED Final   Serratia marcescens NOT DETECTED NOT DETECTED Final   Haemophilus influenzae NOT DETECTED NOT DETECTED Final   Neisseria meningitidis NOT DETECTED NOT DETECTED Final   Pseudomonas aeruginosa NOT DETECTED NOT DETECTED Final   Stenotrophomonas maltophilia NOT DETECTED NOT DETECTED Final   Candida albicans NOT DETECTED NOT DETECTED Final   Candida auris NOT  DETECTED NOT DETECTED Final   Candida glabrata NOT DETECTED NOT DETECTED Final   Candida krusei NOT DETECTED NOT DETECTED Final   Candida parapsilosis NOT DETECTED NOT DETECTED Final   Candida tropicalis NOT DETECTED NOT DETECTED Final   Cryptococcus neoformans/gattii NOT DETECTED NOT DETECTED Final   CTX-M ESBL NOT DETECTED NOT DETECTED Final   Carbapenem resistance IMP NOT DETECTED NOT DETECTED Final   Carbapenem resistance KPC NOT DETECTED NOT DETECTED Final   Carbapenem resistance NDM NOT DETECTED NOT DETECTED Final   Carbapenem resist OXA 48 LIKE NOT DETECTED NOT DETECTED Final   Carbapenem resistance VIM NOT DETECTED NOT DETECTED Final    Comment: Performed at [facility], [place] [address], [place], [place] [address]  Resp Panel by RT-PCR (Flu A&B, Covid) Nasopharyngeal Swab     Status: None   Collection Time: 04/17/2021  1:00 PM   Specimen: Nasopharyngeal Swab; Nasopharyngeal(NP) swabs in vial transport medium  Result Value Ref Range Status   SARS Coronavirus 2 by RT PCR NEGATIVE NEGATIVE Final    Comment: (NOTE) SARS-CoV-2 target nucleic acids are NOT DETECTED.  The SARS-CoV-2 RNA is generally detectable in upper respiratory specimens during the acute phase of infection. The lowest concentration of SARS-CoV-2 viral copies this assay can detect is 138 copies/mL. A negative result does not preclude SARS-Cov-2 infection and should not be used as the sole basis for treatment or other patient management decisions. A negative result may occur with  improper  specimen collection/handling, submission of specimen other than nasopharyngeal swab, presence of viral mutation(s) within the areas targeted by this assay, and inadequate number of viral copies(<138 copies/mL). A negative result must be combined with clinical observations, patient history, and epidemiological information. The expected result is Negative.  Fact Sheet for Patients:  EntrepreneurPulse.com.au  Fact Sheet for Healthcare Providers:  IncredibleEmployment.be  This test is no t yet approved or cleared by the [place] FDA and  has been authorized for detection and/or diagnosis of SARS-CoV-2 by FDA under an Emergency Use Authorization (EUA). This EUA will remain  in effect (meaning this test can be used) for the duration of the COVID-19 declaration under Section 564(b)(1) of the Act, 21 U.S.C.section 360bbb-3(b)(1), unless the authorization is terminated  or revoked sooner.       Influenza A by PCR NEGATIVE NEGATIVE Final   Influenza B by PCR NEGATIVE NEGATIVE Final    Comment: (NOTE) The Xpert Xpress SARS-CoV-2/FLU/RSV plus assay is intended as an aid in the diagnosis of influenza from Nasopharyngeal swab specimens and should not be used as a sole basis for treatment. Nasal washings and aspirates are unacceptable for Xpert Xpress SARS-CoV-2/FLU/RSV testing.  Fact Sheet for Patients: EntrepreneurPulse.com.au  Fact Sheet for Healthcare Providers: IncredibleEmployment.be  This test is not yet approved or cleared by the [place] FDA and has been authorized for detection and/or diagnosis of SARS-CoV-2 by FDA under an Emergency Use Authorization (EUA). This EUA will remain in effect (meaning this test can be used) for the duration of the COVID-19 declaration under Section 564(b)(1) of the Act, 21 U.S.C. section 360bbb-3(b)(1), unless the authorization is terminated or revoked.  Performed at  [facility], [place] [address] [place],  [address]   Surgical pcr screen     Status: None   Collection Time: 04/06/2021  5:23 PM   Specimen: Nasal Mucosa; Nasal Swab  Result Value Ref Range Status   MRSA, PCR NEGATIVE NEGATIVE Final   Staphylococcus aureus NEGATIVE NEGATIVE Final  Comment: (NOTE) The Xpert SA Assay (FDA approved for NASAL specimens in patients 26 years of age and older), is one component of a comprehensive surveillance program. It is not intended to diagnose infection nor to guide or monitor treatment. Performed at [facility], [place] [address], [place], [place] [address]   Culture, blood (Routine x 2)     Status: None   Collection Time: 04/17/2021 10:53 PM   Specimen: BLOOD  Result Value Ref Range Status   Specimen Description BLOOD LEFT ANTECUBITAL  Final   Special Requests   Final    BOTTLES DRAWN AEROBIC ONLY Blood Culture adequate volume   Culture   Final    NO GROWTH 5 DAYS Performed at [facility], [place] [address], [place], [place] [address]    Report Status 04/04/2021 FINAL  Final  Urine Culture     Status: None   Collection Time: 03/31/21  6:33 AM   Specimen: Urine, Random  Result Value Ref Range Status   Specimen Description URINE, RANDOM  Final   Special Requests NONE  Final   Culture   Final    NO GROWTH Performed at [facility], [place] [address], [place], [place] [address]    Report Status 04/02/2021 FINAL  Final  Culture, blood (routine x 2)     Status: None (Preliminary result)   Collection Time: 04/02/21  2:12 PM   Specimen: BLOOD  Result Value Ref Range Status   Specimen Description BLOOD BLOOD RIGHT HAND  Final   Special Requests   Final    AEROBIC BOTTLE ONLY Blood Culture results may not be optimal due to an inadequate volume of blood received in culture bottles   Culture   Final    NO GROWTH 2 DAYS Performed at [facility], [place] [address], [place], [place] [address]    Report Status PENDING   Incomplete  Culture, blood (routine x 2)     Status: None (Preliminary result)   Collection Time: 04/02/21  2:21 PM   Specimen: BLOOD  Result Value Ref Range Status   Specimen Description BLOOD BLOOD RIGHT HAND  Final   Special Requests   Final    AEROBIC BOTTLE ONLY Blood Culture results may not be optimal due to an inadequate volume of blood received in culture bottles   Culture   Final    NO GROWTH 2 DAYS Performed at [facility], [place] [address], [place], [place] [address]    Report Status PENDING  Incomplete     Serology:    Imaging: If present, new imagings (plain films, ct scans, and mri) have been personally visualized and interpreted; radiology reports have been reviewed. Decision making incorporated into the Impression / Recommendations.  7/17 cxr FINDINGS: Lung volumes are low. Patchy multifocal airspace consolidation widespread areas of interstitial prominence are again noted throughout the lungs bilaterally. Emphysematous changes are also noted. Small left pleural effusion. No definite right pleural effusion. No pneumothorax. Cardiac silhouette is partially obscured, but heart size appears normal. Upper mediastinal contours are within normal limits. Atherosclerotic calcifications in the thoracic aorta. Left-sided pacemaker device in place with lead tips projecting over the expected location of the right atrium and right ventricle.   IMPRESSION: 1. The appearance the chest is very similar to recent prior studies, favored to reflect severe multilobar bilateral pneumonia (left greater than right). 2. Small left pleural effusion. 3. Aortic atherosclerosis.  7/14 tte  1. Left ventricular ejection fraction,  by estimation, is 55 to 60%. The  left ventricle has normal function. The left ventricle has no regional  wall motion abnormalities. Left ventricular diastolic parameters are  consistent with Grade II diastolic  dysfunction (pseudonormalization).   2.  Peak RV-RA gradient 60.5 mmHg. The IVC was poorly visualized. Right  ventricular systolic function is mildly reduced. The right ventricular  size is mildly enlarged.   3. Left atrial size was mildly dilated.   4. The mitral valve is normal in structure. Moderate mitral valve  regurgitation. No evidence of mitral stenosis.   5. The aortic valve is tricuspid. Aortic valve regurgitation is mild.  Mild aortic valve sclerosis is present, with no evidence of aortic valve  stenosis.   7/13 cta chest 1. Occlusive pulmonary embolus involving a segmental branch of the left lower lobe. 2. Chronic interstitial lung disease. 3.  Aortic Atherosclerosis (ICD10-I70.0). 4. Multi vessel coronary artery atherosclerosis.    Jabier Mutton, [facility] [place] [phone] pager    04/04/2021, 10:36 AM

## 2021-04-04 NOTE — Progress Notes (Addendum)
NAME:  [name], MRN:  [id number], DOB:  10/14/1930, LOS: 5 ADMISSION DATE:  03/29/2021, CONSULTATION DATE:  7/14 REFERRING MD:  Dr. Tyrell Antonio, CHIEF COMPLAINT:  hypotension; PE   History of Present Illness:  87 yo M w/ PMH of R hip fracture post ORIF 1 week ago, chronic foley catheter, myasthenia gravis on chronic steroid, HTN, BPH, dementia presents to [facility] on 7/13 with hypotension.  Patient was discharged from [facility] 1 week ago after ORIF of right hip. Discharged on aspirin and plavix. Patient developed right leg swelling and pain around 7/9. On 7/12, DVT ultrasound positive for fight leg DVT. Started on Xarelto.  On 7/14, patient was hypotensive and in respiratory distress at his rehab facility and was admitted to [facility]. Denies cough, fever, chills.  ED course: BP borderline low, hypoxia resolved on 2 l/m, CTA positive for LLL subsegmental PE. WBC 18, Na 126.  PCCM consulted on 7/14 to evaluate management for hypotension and hypoxemia.  Reconsulted on 7/17 for transient hypotension and increased work of breathing on 6 L/min.  Has been on anticoagulation, heparin was changed to Xarelto 7/16.  Found to have Enterobacter bacteremia and treated with doxycycline plus cefepime, has been on stress dose steroids with hydrocortisone given his chronic prednisone use.  Follow-up blood cultures 7/16 still negative.  CXR possible pneumonia versus pulmonary edema.  Oxygen requirement increased to 6 L/min on 7/16, has received diuretics.  I/O- 3.9 L total.   Pertinent  Medical History   Past Medical History:  Diagnosis Date   Carotid artery disease ([place])    Coronary artery disease    PCI RCA 2009   Dyslipidemia    Hypertension    Scleritis, unspecified    treated with prednisone   Seizure ([place])    felt due to cyclosporin in setting of electrolyte disorder   Solitary kidney    Symptomatic bradycardia    a. s/p STJ dual chamber pacemaker     Significant Hospital Events: Including procedures,  antibiotic start and stop dates in addition to other pertinent events   7/13: patient admitted to [facility] for hypotension and PE 7/14: PCCM consulted for hypotension. Responding to IV fluids.  7/17 PCCM consult for transient hypotension, increased work of breathing on 6 L/min  Interim History / Subjective:  Increasing O2 needs to 15L  1.1L UOP documented in last 24h but no intake > inaccurate data Pt yells out periodically and states it makes him feel bettter  ? If sat readings accurate with fine tremor / frequent hand motions with talking  Objective   Blood pressure (!) 120/57, pulse 80, temperature 98.7 F (37.1 C), temperature source Oral, resp. rate 20, height 5\' 7"  (1.702 m), weight 79 kg, SpO2 95 %.    FiO2 (%):  [40 %] 40 %   Intake/Output Summary (Last 24 hours) at 04/04/2021 1030 Last data filed at 04/03/2021 1209 Gross per 24 hour  Intake --  Output 1100 ml  Net -1100 ml   Filed Weights   03/25/2021 1229 04/10/2021 2204  Weight: 73.5 kg 79 kg    Examination: General: elderly adult male lying in bed in NAD  HEENT: MM pink/moist, [place] O2, anicteric  Neuro: Awake, alert, situational confusion, yells out, easily redirected CV: s1s2 RRR, no m/r/g PULM:  non-labored on [place] O2, lungs bilaterally with crackles  GI: soft, bsx4 active  Extremities: warm/dry, BLE 1+ edema  Skin: no rashes or lesions  Resolved Hospital Problem list     Assessment &  Plan:   Sepsis due to Enterobacter bacteremia and possible pneumonia Enterobacter bacteremia Bilateral pulmonary infiltrates, question pneumonia Remains hemodynamically stable. Initially had some low BP readings with diuresis + sepsis.  -continue abx per primary / ID  -agree with empiric antifungal coverage given long term steroid use, discussed with ID -transition back to home steroids, prednisone 20 mg QD -pulmonary hygiene -IS, mobilize as able   Acute PE / DVT  CTA 7/13 occlusive PE LLL with no signs of heart strain; Echo  reassuring. Started on Xarelto 3 days prior to admission -continue xarelto  -follow H/H to ensure no evidence of bleeding   Acute hypoxic resp failure: Multifactorial.  Pulmonary embolism, bilateral pulmonary infiltrates superimposed on interstitial disease.  Consider component of restrictive physiology due to respiratory muscle weakness given his myasthenia gravis, overall debilitated state due to ORIF, hospitalization.   CT chest 7/13: shows chronic interstitial lung disease.  Subsequent chest x-ray with evolving bibasilar infiltrates superimposed on his ILD.  Consider pneumonia as above, aspiration, consider atelectasis -pulmonary hygiene as above  -wean O2 for sats >90% -abx per ID  -follow intermittent CXR   MG, chronic steroid use, hyponatremia, recent ORIF -continue prednisone  -continue mestinon   Best Practice (right click and "Reselect all SmartList Selections" daily)  Diet/type: Regular consistency (see orders) DVT prophylaxis: DOAC GI prophylaxis: N/A Lines: N/A Foley:  Yes, and it is still needed Code Status:  full code Last date of multidisciplinary goals of care discussion - per primary, consider palliative discussion given overall illness burden and acute needs.    Critical care time: n/a    Noe Gens, MSN, APRN, NP-C, AGACNP-BC [facility] 04/04/2021, 10:30 AM   Please see [email or web address] for pager details.   From 7A-7P if no response, please call [phone] After hours, please call ELink [phone]

## 2021-04-04 NOTE — Progress Notes (Signed)
Called by lab, lactic acid 2.1 . Result relayed to Dr Tonie Griffith . Patient confused, restless on and off, on O2 7l/mn [place] and O2 sat noted on the 80's Resp therapist notified , placed patient on [place] HF 15L , o2 sat up to 94-95% .Above  MD also notified of patient status., no new order given.,Continue to monitor patient

## 2021-04-04 NOTE — Progress Notes (Signed)
PROGRESS NOTE    [name]  JME:[id number] DOB: 09/28/1930 DOA: 04/01/2021 PCP: Shirline Frees, MD   Brief Narrative: 87 year old with past medical history significant for right hip fracture status post ORIF with intramedullary nailing, urinary retention status post Foley catheter, myasthenia gravis on chronic Steroids, hypertension, BPH, dementia, essential tremor presents with hypotension.  Patient was recently discharged from the hospital 1 week ago after ORIF of the right hip.  Patient over the last 2 or 3 days developed right leg swelling and pain.  Doppler lower extremity was positive for DVT.  Patient was a started on Xarelto overnight.  The morning of admission patient was found to be in respiratory distress and an low blood pressure.  Patient was referred to the ED for further evaluation.  Patient was noted to have a rash on his left elbow.  Evaluation in the ED patient was noted to have borderline low blood pressure, hypoxia.  CTA positive for left lower lobe subsegmental PE.    Assessment & Plan:   Active Problems:   Pulmonary emboli (HCC)   Acute respiratory failure with hypoxia (HCC)   Hypotension   Bacteremia   Severe sepsis without septic shock (HCC)   1-Subsegmental left lower lobe PE, DVT,  Acute Hypoxic Respiratory Failure.  Acute pulmonary edema secondary to acute diastolic HF exacerbation:  PNA -Patient was treated initially on  heparin drip.  -He was  transition to [place] 7/16. -Chest X-ray 7/15: Progressive interstitial and ground glass opacities consistent with worsening edema or infection.  -Started on IV lasix 7/15 to help with pulmonary edema.  -Increase oxygen requirement on 7/17 up to 6 L. Repeated chest x ray Showed: similar appearance favored to reflect severe multilobar PNA> \-Pulmonologist consulted.  -Plan to continue IV antibiotics and daily lasix.  -He is requiring 15 L oxygen, worsening hypoxemia. Chest x ray pending, pulmonologist  informed of increase oxygen requirement.  -ID consulted: checking Fungitell and Pneumocystis smear.  -will discuss with CCM need for PJP treatment.   2-Anemia;  Hb down to 7.6. No evidence of active bleeding.  Received one unit PRBC.  Prior HD; 7.9 July 5.  Hb stable. 9.9  3-Sepsis, 1/2 Blood culture positive for Enterobacter Cloaceae.  Presents with leukocytosis, hypotension, left elbow cellulitis.  On IV steroid.  On antibiotics, Doxy. Started on Cefepime 7/14. Repeated Blood culture 7/16: no growth to date.  WBC elevated but patient is on IV steroids.   Abdominal pain;  On IV protonix.  KUB: mild ileus, Moderate stool in the colon including moderate retained feces at the rectum. Had BM 7/14, 7/15. Continue with miralax, senna, PRN supp.   Chronic steroid use;  On chronic prednisone.  Continue with IV hydrocortisone.  Plan to resume prednisone 7/18, awaiting pulmonologist evaluation.   Hyponatremia:  Change diet to regular.  Received IV fluids.  Monitor.   Recent ORIF, Right Hip:  Ortho : Dr Victorino December.  Evaluated by Ortho continue with staples and dry dressing PRN  Chronic Indwelling Foley catheter:  Needs to follow up with urology.  Plan to exchange foley   Delirium;  Risperidone to BID PRN agitation.  Sitter ordered.    Estimated body mass index is 27.28 kg/m as calculated from the following:   Height as of this encounter: 5\' 7"  (1.702 m).   Weight as of this encounter: 79 kg.   DVT prophylaxis: Heparin gtt Code Status: Full code Family Communication: Daughter updated. 7/17 Disposition Plan:  Status is: Inpatient  Remains inpatient  appropriate because:Hemodynamically unstable  Dispo: The patient is from: SNF              Anticipated d/c is to: SNF              Patient currently is not medically stable to d/c.   Difficult to place patient No        Consultants:  None  Procedures:  ECHO P  Antimicrobials:    Subjective: He denies  worsening dyspnea. No cough. He has pain all over. He would like tylenol.  Increase oxygen requirement overnight.    Objective: Vitals:   04/04/21 0052 04/04/21 0401 04/04/21 0713 04/04/21 0734  BP: (!) 150/76 120/67 (!) 120/57   Pulse: 79 73 80   Resp: (!) 22 18 20    Temp:  (!) 97.5 F (36.4 C) 98.7 F (37.1 C)   TempSrc:  Oral Oral   SpO2: 91% 98% 94% 95%  Weight:      Height:       No intake or output data in the 24 hours ending 04/04/21 1248  Filed Weights   03/18/2021 1229 03/27/2021 2204  Weight: 73.5 kg 79 kg    Examination:  General exam: NAD Respiratory system: BL crackles.  Cardiovascular system: S 1, S 2 RRR Gastrointestinal system: BS present, soft, nt Central nervous system: Alert, confuse Extremities: Symmetric 5 x 5 power. Dressing right hip, bruises.     Data Reviewed: I have personally reviewed following labs and imaging studies  CBC: Recent Labs  Lab 03/20/2021 1255 03/31/21 0057 03/31/21 1935 04/01/21 0532 04/02/21 0034 04/03/21 0032 04/04/21 0020  WBC 18.0* 13.0*  --  12.2* 12.4* 14.9* 14.9*  NEUTROABS 16.8*  --   --   --   --   --   --   HGB 9.1* 7.2* 8.9* 8.2* 8.9* 9.5* 9.9*  HCT 28.8* 22.9* 27.5* 26.0* 28.0* 30.0* 31.8*  MCV 104.0* 101.8*  --  98.1 95.9 96.2 98.1  PLT 231 196  --  199 201 207 993    Basic Metabolic Panel: Recent Labs  Lab 03/31/21 0057 04/01/21 0726 04/02/21 0034 04/03/21 0032 04/04/21 0020  NA 128* 134* 134* 135 136  K 3.9 3.8 3.3* 3.5 3.7  CL 99 105 103 101 101  CO2 22 19* 23 24 25   GLUCOSE 148* 137* 144* 135* 122*  BUN 21 16 16 18  25*  CREATININE 1.13 0.93 0.91 0.90 1.00  CALCIUM 8.1* 8.7* 8.6* 8.6* 8.9    GFR: Estimated Creatinine Clearance: 47.7 mL/min (by C-G formula based on SCr of 1 mg/dL). Liver Function Tests: Recent Labs  Lab 04/12/2021 1255  AST 20  ALT 15  ALKPHOS 92  BILITOT 1.3*  PROT 5.6*  ALBUMIN 2.5*    No results for input(s): LIPASE, AMYLASE in the last 168 hours. No results  for input(s): AMMONIA in the last 168 hours. Coagulation Profile: Recent Labs  Lab 04/14/2021 1255  INR 2.6*    Cardiac Enzymes: No results for input(s): CKTOTAL, CKMB, CKMBINDEX, TROPONINI in the last 168 hours. BNP (last 3 results) No results for input(s): PROBNP in the last 8760 hours. HbA1C: No results for input(s): HGBA1C in the last 72 hours. CBG: No results for input(s): GLUCAP in the last 168 hours. Lipid Profile: No results for input(s): CHOL, HDL, LDLCALC, TRIG, CHOLHDL, LDLDIRECT in the last 72 hours. Thyroid Function Tests: No results for input(s): TSH, T4TOTAL, FREET4, T3FREE, THYROIDAB in the last 72 hours. Anemia Panel: No results for input(s): VITAMINB12,  FOLATE, FERRITIN, TIBC, IRON, RETICCTPCT in the last 72 hours.  Sepsis Labs: Recent Labs  Lab 03/31/21 1354 03/31/21 1935 04/03/21 0032 04/03/21 1230 04/04/21 0020  PROCALCITON  --   --  <0.10  --  0.12  LATICACIDVEN 3.6* 2.4*  --  1.9 2.1*     Recent Results (from the past 240 hour(s))  Culture, blood (Routine x 2)     Status: Abnormal   Collection Time: 03/31/2021 12:55 PM   Specimen: BLOOD  Result Value Ref Range Status   Specimen Description BLOOD RIGHT ANTECUBITAL  Final   Special Requests   Final    BOTTLES DRAWN AEROBIC AND ANAEROBIC Blood Culture results may not be optimal due to an excessive volume of blood received in culture bottles   Culture  Setup Time   Final    GRAM NEGATIVE RODS AEROBIC BOTTLE ONLY CRITICAL RESULT CALLED TO, READ BACK BY AND VERIFIED WITHAndres Shad PHARMD 1327 03/31/21 A BROWNING Performed at [facility], [place] [address] [place], [place] [address]    Culture ENTEROBACTER CLOACAE (A)  Final   Report Status 04/02/2021 FINAL  Final   Organism ID, Bacteria ENTEROBACTER CLOACAE  Final      Susceptibility   Enterobacter cloacae - MIC*    CEFAZOLIN >=64 RESISTANT Resistant     CEFEPIME <=0.12 SENSITIVE Sensitive     CEFTAZIDIME <=1 SENSITIVE Sensitive      CIPROFLOXACIN <=0.25 SENSITIVE Sensitive     GENTAMICIN <=1 SENSITIVE Sensitive     IMIPENEM 1 SENSITIVE Sensitive     TRIMETH/SULFA >=320 RESISTANT Resistant     PIP/TAZO <=4 SENSITIVE Sensitive     * ENTEROBACTER CLOACAE  Blood Culture ID Panel (Reflexed)     Status: Abnormal   Collection Time: 03/27/2021 12:55 PM  Result Value Ref Range Status   Enterococcus faecalis NOT DETECTED NOT DETECTED Final   Enterococcus Faecium NOT DETECTED NOT DETECTED Final   Listeria monocytogenes NOT DETECTED NOT DETECTED Final   Staphylococcus species NOT DETECTED NOT DETECTED Final   Staphylococcus aureus (BCID) NOT DETECTED NOT DETECTED Final   Staphylococcus epidermidis NOT DETECTED NOT DETECTED Final   Staphylococcus lugdunensis NOT DETECTED NOT DETECTED Final   Streptococcus species NOT DETECTED NOT DETECTED Final   Streptococcus agalactiae NOT DETECTED NOT DETECTED Final   Streptococcus pneumoniae NOT DETECTED NOT DETECTED Final   Streptococcus pyogenes NOT DETECTED NOT DETECTED Final   A.calcoaceticus-baumannii NOT DETECTED NOT DETECTED Final   Bacteroides fragilis NOT DETECTED NOT DETECTED Final   Enterobacterales DETECTED (A) NOT DETECTED Final    Comment: Enterobacterales represent a large order of gram negative bacteria, not a single organism.   Enterobacter cloacae complex DETECTED (A) NOT DETECTED Final    Comment: CRITICAL RESULT CALLED TO, READ BACK BY AND VERIFIED WITH: Andres Shad PHARMD 1327 03/31/21 A BROWNING    Escherichia coli NOT DETECTED NOT DETECTED Final   Klebsiella aerogenes NOT DETECTED NOT DETECTED Final   Klebsiella oxytoca NOT DETECTED NOT DETECTED Final   Klebsiella pneumoniae NOT DETECTED NOT DETECTED Final   Proteus species NOT DETECTED NOT DETECTED Final   Salmonella species NOT DETECTED NOT DETECTED Final   Serratia marcescens NOT DETECTED NOT DETECTED Final   Haemophilus influenzae NOT DETECTED NOT DETECTED Final   Neisseria meningitidis NOT DETECTED NOT DETECTED  Final   Pseudomonas aeruginosa NOT DETECTED NOT DETECTED Final   Stenotrophomonas maltophilia NOT DETECTED NOT DETECTED Final   Candida albicans NOT DETECTED NOT DETECTED Final   Candida auris  NOT DETECTED NOT DETECTED Final   Candida glabrata NOT DETECTED NOT DETECTED Final   Candida krusei NOT DETECTED NOT DETECTED Final   Candida parapsilosis NOT DETECTED NOT DETECTED Final   Candida tropicalis NOT DETECTED NOT DETECTED Final   Cryptococcus neoformans/gattii NOT DETECTED NOT DETECTED Final   CTX-M ESBL NOT DETECTED NOT DETECTED Final   Carbapenem resistance IMP NOT DETECTED NOT DETECTED Final   Carbapenem resistance KPC NOT DETECTED NOT DETECTED Final   Carbapenem resistance NDM NOT DETECTED NOT DETECTED Final   Carbapenem resist OXA 48 LIKE NOT DETECTED NOT DETECTED Final   Carbapenem resistance VIM NOT DETECTED NOT DETECTED Final    Comment: Performed at [facility], [place] [address] [place], [place] [address]  Resp Panel by RT-PCR (Flu A&B, Covid) Nasopharyngeal Swab     Status: None   Collection Time: 03/25/2021  1:00 PM   Specimen: Nasopharyngeal Swab; Nasopharyngeal(NP) swabs in vial transport medium  Result Value Ref Range Status   SARS Coronavirus 2 by RT PCR NEGATIVE NEGATIVE Final    Comment: (NOTE) SARS-CoV-2 target nucleic acids are NOT DETECTED.  The SARS-CoV-2 RNA is generally detectable in upper respiratory specimens during the acute phase of infection. The lowest concentration of SARS-CoV-2 viral copies this assay can detect is 138 copies/mL. A negative result does not preclude SARS-Cov-2 infection and should not be used as the sole basis for treatment or other patient management decisions. A negative result may occur with  improper specimen collection/handling, submission of specimen other than nasopharyngeal swab, presence of viral mutation(s) within the areas targeted by this assay, and inadequate number of viral copies(<138 copies/mL). A negative result  must be combined with clinical observations, patient history, and epidemiological information. The expected result is Negative.  Fact Sheet for Patients:  EntrepreneurPulse.com.au  Fact Sheet for Healthcare Providers:  IncredibleEmployment.be  This test is no t yet approved or cleared by the [place] FDA and  has been authorized for detection and/or diagnosis of SARS-CoV-2 by FDA under an Emergency Use Authorization (EUA). This EUA will remain  in effect (meaning this test can be used) for the duration of the COVID-19 declaration under Section 564(b)(1) of the Act, 21 U.S.C.section 360bbb-3(b)(1), unless the authorization is terminated  or revoked sooner.       Influenza A by PCR NEGATIVE NEGATIVE Final   Influenza B by PCR NEGATIVE NEGATIVE Final    Comment: (NOTE) The Xpert Xpress SARS-CoV-2/FLU/RSV plus assay is intended as an aid in the diagnosis of influenza from Nasopharyngeal swab specimens and should not be used as a sole basis for treatment. Nasal washings and aspirates are unacceptable for Xpert Xpress SARS-CoV-2/FLU/RSV testing.  Fact Sheet for Patients: EntrepreneurPulse.com.au  Fact Sheet for Healthcare Providers: IncredibleEmployment.be  This test is not yet approved or cleared by the [place] FDA and has been authorized for detection and/or diagnosis of SARS-CoV-2 by FDA under an Emergency Use Authorization (EUA). This EUA will remain in effect (meaning this test can be used) for the duration of the COVID-19 declaration under Section 564(b)(1) of the Act, 21 U.S.C. section 360bbb-3(b)(1), unless the authorization is terminated or revoked.  Performed at [facility], [place] [address] [place],  [address]   Surgical pcr screen     Status: None   Collection Time: 03/23/2021  5:23 PM   Specimen: Nasal Mucosa; Nasal Swab  Result Value Ref Range Status   MRSA, PCR  NEGATIVE NEGATIVE Final   Staphylococcus aureus NEGATIVE NEGATIVE Final  Comment: (NOTE) The Xpert SA Assay (FDA approved for NASAL specimens in patients 77 years of age and older), is one component of a comprehensive surveillance program. It is not intended to diagnose infection nor to guide or monitor treatment. Performed at [facility], [place] [address] [place], [place] [address]   Culture, blood (Routine x 2)     Status: None   Collection Time: 04/02/2021 10:53 PM   Specimen: BLOOD  Result Value Ref Range Status   Specimen Description BLOOD LEFT ANTECUBITAL  Final   Special Requests   Final    BOTTLES DRAWN AEROBIC ONLY Blood Culture adequate volume   Culture   Final    NO GROWTH 5 DAYS Performed at [facility], [place] [address] [place], [place] [address]    Report Status 04/04/2021 FINAL  Final  Urine Culture     Status: None   Collection Time: 03/31/21  6:33 AM   Specimen: Urine, Random  Result Value Ref Range Status   Specimen Description URINE, RANDOM  Final   Special Requests NONE  Final   Culture   Final    NO GROWTH Performed at [facility], [place] [address] [place], [place] [address]    Report Status 04/02/2021 FINAL  Final  Culture, blood (routine x 2)     Status: None (Preliminary result)   Collection Time: 04/02/21  2:12 PM   Specimen: BLOOD  Result Value Ref Range Status   Specimen Description BLOOD BLOOD RIGHT HAND  Final   Special Requests   Final    AEROBIC BOTTLE ONLY Blood Culture results may not be optimal due to an inadequate volume of blood received in culture bottles   Culture   Final    NO GROWTH 2 DAYS Performed at [facility], [place] [address], [place], [place] [address]    Report Status PENDING  Incomplete  Culture, blood (routine x 2)     Status: None (Preliminary result)   Collection Time: 04/02/21  2:21 PM   Specimen: BLOOD  Result Value Ref Range Status   Specimen Description BLOOD BLOOD RIGHT HAND  Final    Special Requests   Final    AEROBIC BOTTLE ONLY Blood Culture results may not be optimal due to an inadequate volume of blood received in culture bottles   Culture   Final    NO GROWTH 2 DAYS Performed at [facility], [place] [address], [place], [place] [address]    Report Status PENDING  Incomplete          Radiology Studies: DG CHEST PORT 1 VIEW  Result Date: 04/03/2021 CLINICAL DATA:  87 year old male with history of hypoxemia. Hypertension. EXAM: PORTABLE CHEST 1 VIEW COMPARISON:  Chest x-ray 04/01/2021. FINDINGS: Lung volumes are low. Patchy multifocal airspace consolidation widespread areas of interstitial prominence are again noted throughout the lungs bilaterally. Emphysematous changes are also noted. Small left pleural effusion. No definite right pleural effusion. No pneumothorax. Cardiac silhouette is partially obscured, but heart size appears normal. Upper mediastinal contours are within normal limits. Atherosclerotic calcifications in the thoracic aorta. Left-sided pacemaker device in place with lead tips projecting over the expected location of the right atrium and right ventricle. IMPRESSION: 1. The appearance the chest is very similar to recent prior studies, favored to reflect severe multilobar bilateral pneumonia (left greater than right). 2. Small left pleural effusion. 3. Aortic atherosclerosis. Electronically Signed   By: Vinnie Langton M.D.   On:  04/03/2021 08:40        Scheduled Meds:  albuterol  2.5 mg Nebulization TID   Chlorhexidine Gluconate Cloth  6 each Topical Daily   docusate sodium  100 mg Oral BID   doxycycline  100 mg Oral Q12H   feeding supplement  237 mL Oral BID BM   finasteride  5 mg Oral q morning   furosemide  20 mg Intravenous Daily   hydrocortisone sod succinate (SOLU-CORTEF) inj  50 mg Intravenous Q12H   neomycin-bacitracin-polymyxin   Topical See admin instructions   pantoprazole  40 mg Oral Q supper   polyethylene glycol  17 g Oral  BID   potassium chloride  40 mEq Oral BID   pyridostigmine  60 mg Oral 4 times per day   Rivaroxaban  15 mg Oral BID WC   Followed by   Derrill Memo ON 04/20/2021] rivaroxaban  20 mg Oral Q supper   senna  1 tablet Oral Daily   tamsulosin  0.4 mg Oral Daily   Continuous Infusions:  ceFEPime (MAXIPIME) IV 2 g (04/04/21 0954)     LOS: 5 days    Time spent: 35 minutes,.     Elmarie Shiley, MD [facility]   If 7PM-7AM, please contact night-coverage [email or web address]  04/04/2021, 12:48 PM

## 2021-04-04 NOTE — Progress Notes (Signed)
Called to room by RN due to patient SPo2 86%. Pt is on 7 L via [place]. I placed patient on [place] at 15 L and SPo2 now 94%. Spoke with RN if sats sustain, can titrate liter flow.

## 2021-04-04 NOTE — Progress Notes (Signed)
PT Cancellation Note  Patient Details Name: [name] MRN: [id number] DOB: [birth year]   Cancelled Treatment:    Reason Eval/Treat Not Completed: Medical issues which prohibited therapy. Pt with declining respiratory status and didn't tolerate activity with OT very well. Will continue to follow.   [name] [facility] 04/04/2021, 2:47 PM Ladaja Yusupov [place] Pager [phone] Office [phone]

## 2021-04-04 NOTE — Progress Notes (Signed)
Attempted sputum induction for pneumocystis smear.  At this time patient has a dry, non-productive cough.

## 2021-04-04 NOTE — Progress Notes (Signed)
Pharmacy Antibiotic Note  [name] is a 87 y.o. male admitted on 04/15/2021 with possible pneumocystis pneumonia.  Pharmacy has been consulted for Bactrim dosing. CrCl 48 ml/min  Plan: Start Bactrim 400 mg IV q6hr Monitor renal function, clinical status and C&S  Height: 5\' 7"  (170.2 cm) Weight: 79 kg (174 lb 2.6 oz) IBW/kg (Calculated) : 66.1  Temp (24hrs), Avg:98.2 F (36.8 C), Min:97.4 F (36.3 C), Max:98.9 F (37.2 C)  Recent Labs  Lab 04/05/2021 1255 03/31/21 0057 03/31/21 1354 03/31/21 1935 04/01/21 0532 04/01/21 0726 04/02/21 0034 04/03/21 0032 04/03/21 1230 04/04/21 0020  WBC 18.0* 13.0*  --   --  12.2*  --  12.4* 14.9*  --  14.9*  CREATININE 1.15 1.13  --   --   --  0.93 0.91 0.90  --  1.00  LATICACIDVEN 1.2  --  3.6* 2.4*  --   --   --   --  1.9 2.1*    Estimated Creatinine Clearance: 47.7 mL/min (by C-G formula based on SCr of 1 mg/dL).    Allergies  Allergen Reactions   Cefuroxime Axetil Other (See Comments)    Reaction not recalled   Cyclosporine Other (See Comments)    Reaction not recalled   Elemental Sulfur Other (See Comments)    No energy, could not walk    Maxzide [Triamterene-Hctz] Other (See Comments)    Adverse reaction- bloating and abdominal pain   Sulfa Antibiotics Other (See Comments)    Could not sleep/eat and experienced gastric pain   Sulfamethoxazole Other (See Comments)    Could not sleep/eat and had gastric pain   Sulfamethoxazole-Trimethoprim Other (See Comments)    Gastric pain and could not eat/sleep   Prednisone Other (See Comments)    Can tolerate for a short period of time    Antimicrobials this admission: Bactrim 7/18 >>  Thank you for allowing pharmacy to be a part of this patient's care.  Alanda Slim, PharmD, [facility] Clinical Pharmacist Please see AMION for all Pharmacists' Contact Phone Numbers 04/04/2021, 5:41 PM

## 2021-04-04 NOTE — Evaluation (Signed)
Occupational Therapy Evaluation Patient Details Name: [name] MRN: [id number] DOB: [birth year] Today's Date: 04/04/2021    History of Present Illness Pt adm from SNF on 7/13 with hypoxia and hypotension. Pt found to have PE and heparin initiated 7/14. Pt also with sepsis due to lt elbow cellulitis. PMH - rt hip fx with ORIF 03/19/21,, myasthenia gravis, dementia, essential tremor,  bradycardia, solitary kidney, HTN, CAD, s/p ORIF of Rt humerus, pacer, cad, seizure   Clinical Impression   PTA, pt admitted from SNF rehab with diagnoses above and deficits in cardiopulmonary tolerance, standing balance, strength and cognition. Pt desats with minimal activity and difficulty attending to pursed lip breathing cues though motivated to attempt all tasks. Pt overall Max A for bed mobility, Mod A for sit to stand trial using RW though unable to successfully take steps today. Pt requires Min A for UB ADLs and up to Total A for LB ADLs. Plan to progress OOB activity tolerance with ADLs in next session. Recommend DC back to SNF rehab.  Pt received on 15 L HFNC. Desats to 76% sitting EOB and standing at bedside, recovers quickly to 84% and hovers around 89% at rest. Pt desats when talking. HR up to 117bpm with activity.    Follow Up Recommendations  SNF;Supervision/Assistance - 24 hour    Equipment Recommendations  None recommended by OT    Recommendations for Other Services       Precautions / Restrictions Precautions Precautions: Fall Restrictions Weight Bearing Restrictions: Yes RLE Weight Bearing: Weight bearing as tolerated      Mobility Bed Mobility Overal bed mobility: Needs Assistance Bed Mobility: Supine to Sit;Sit to Supine     Supine to sit: HOB elevated;Max assist Sit to supine: Max assist   General bed mobility comments: max A to advance B LE and lift trunk in and out of bed    Transfers Overall transfer level: Needs assistance Equipment used: Rolling walker (2  wheeled) Transfers: Sit to/from Stand Sit to Stand: Mod assist;From elevated surface         General transfer comment: Able to stand with mod A from elevated bed with RW, unable to take steps. cues for hand placement and posture    Balance Overall balance assessment: Needs assistance Sitting-balance support: No upper extremity supported;Feet supported Sitting balance-Leahy Scale: Fair     Standing balance support: Bilateral upper extremity supported Standing balance-Leahy Scale: Poor Standing balance comment: reliant on UE support and external support                           ADL either performed or assessed with clinical judgement   ADL Overall ADL's : Needs assistance/impaired                     Lower Body Dressing: Total assistance;+2 for safety/equipment;Sit to/from stand Lower Body Dressing Details (indicate cue type and reason): Total A to don socks sitting EOB     Toileting- Clothing Manipulation and Hygiene: Total assistance;+2 for safety/equipment;Sit to/from stand Toileting - Clothing Manipulation Details (indicate cue type and reason): Total A for posterior hygiene after bowel incontinence, able to stand and maintain balance while assisted. +2 helpful for safety       General ADL Comments: Pr requires extensive assist for LB ADLs though motivated to attempt all tasks. Limited by baseline dementia and high O2 demands with desats during minimal activity     Vision Patient Visual Report: No  change from baseline Vision Assessment?: No apparent visual deficits     Perception     Praxis      Pertinent Vitals/Pain Pain Assessment: Faces Faces Pain Scale: Hurts a little bit Pain Location: R HIP Pain Descriptors / Indicators: Moaning;Sore Pain Intervention(s): Premedicated before session;Monitored during session;Repositioned     Hand Dominance Right   Extremity/Trunk Assessment Upper Extremity Assessment Upper Extremity Assessment:  Generalized weakness   Lower Extremity Assessment Lower Extremity Assessment: Defer to PT evaluation   Cervical / Trunk Assessment Cervical / Trunk Assessment: Kyphotic   Communication Communication Communication: HOH   Cognition Arousal/Alertness: Awake/alert Behavior During Therapy: WFL for tasks assessed/performed Overall Cognitive Status: No family/caregiver present to determine baseline cognitive functioning                                 General Comments: Dementia at baseline, very talkative and tangential at times. follows one step directions well. Pleasant, A&Ox2. Fearful of falling   General Comments  Received on 15 L O2, SpO2 89% at rest. desats to 76% with sitting EOB and standing at bedside. Recovers to 84% within 30 seconds, > 1.5 min to get to 89%. Desats when talking. Pt's friend, Liliane Channel, present and engaged throughout    Exercises     Shoulder Instructions      Home Living Family/patient expects to be discharged to:: [place]: Shower seat - built in;Grab bars - tub/shower;Walker - 4 wheels   Additional Comments: Pt has been at [facility] since hip fx 03/19/21      Prior Functioning/Environment Level of Independence: Needs assistance  Gait / Transfers Assistance Needed: Mod to max assist with transfers since rt hip ORIF on 03/19/21. ADL's / Homemaking Assistance Needed: Staff assist            OT Problem List: Decreased strength;Decreased activity tolerance;Impaired balance (sitting and/or standing);Decreased cognition;Decreased safety awareness;Decreased knowledge of use of DME or AE;Decreased knowledge of precautions;Pain;Cardiopulmonary status limiting activity      OT Treatment/Interventions: Self-care/ADL training;Therapeutic exercise;DME and/or AE instruction;Therapeutic activities;Patient/family education    OT Goals(Current goals can be found in the care plan  section) Acute Rehab OT Goals Patient Stated Goal: avoid anymore falls OT Goal Formulation: With patient Time For Goal Achievement: 04/18/21 Potential to Achieve Goals: Fair  OT Frequency: Min 2X/week   Barriers to D/C:            Co-evaluation              AM-PAC OT "6 Clicks" Daily Activity     Outcome Measure Help from another person eating meals?: A Little Help from another person taking care of personal grooming?: A Little Help from another person toileting, which includes using toliet, bedpan, or urinal?: Total Help from another person bathing (including washing, rinsing, drying)?: Total Help from another person to put on and taking off regular upper body clothing?: A Lot Help from another person to put on and taking off regular lower body clothing?: Total 6 Click Score: 11   End of Session Equipment Utilized During Treatment: Gait belt;Rolling walker;Oxygen Nurse Communication: Mobility status;Other (comment) (O2)  Activity Tolerance: Patient tolerated treatment well Patient left: in bed;with call bell/phone within reach;with bed alarm set;Other (comment) (telesitter)  OT Visit  Diagnosis: Unsteadiness on feet (R26.81);Other abnormalities of gait and mobility (R26.89);Repeated falls (R29.6);Muscle weakness (generalized) (M62.81);History of falling (Z91.81);Pain;Other symptoms and signs involving cognitive function Pain - Right/Left: Right Pain - part of body: Hip                Time: 6761-9509 OT Time Calculation (min): 34 min Charges:  OT General Charges $OT Visit: 1 Visit OT Evaluation $OT Eval Moderate Complexity: 1 Mod OT Treatments $Self Care/Home Management : 8-22 mins  [name], OTR/L Acute Rehab Services Office: [phone]   Layla Maw 04/04/2021, 12:04 PM

## 2021-04-05 DIAGNOSIS — I824Y1 Acute embolism and thrombosis of unspecified deep veins of right proximal lower extremity: Secondary | ICD-10-CM | POA: Diagnosis not present

## 2021-04-05 DIAGNOSIS — R7881 Bacteremia: Secondary | ICD-10-CM | POA: Diagnosis not present

## 2021-04-05 DIAGNOSIS — I2699 Other pulmonary embolism without acute cor pulmonale: Secondary | ICD-10-CM | POA: Diagnosis not present

## 2021-04-05 DIAGNOSIS — J181 Lobar pneumonia, unspecified organism: Secondary | ICD-10-CM

## 2021-04-05 DIAGNOSIS — J9601 Acute respiratory failure with hypoxia: Secondary | ICD-10-CM | POA: Diagnosis not present

## 2021-04-05 LAB — CBC
HCT: 30.5 % — ABNORMAL LOW (ref 39.0–52.0)
Hemoglobin: 9.3 g/dL — ABNORMAL LOW (ref 13.0–17.0)
MCH: 30.5 pg (ref 26.0–34.0)
MCHC: 30.5 g/dL (ref 30.0–36.0)
MCV: 100 fL (ref 80.0–100.0)
Platelets: 208 10*3/uL (ref 150–400)
RBC: 3.05 MIL/uL — ABNORMAL LOW (ref 4.22–5.81)
RDW: 19.4 % — ABNORMAL HIGH (ref 11.5–15.5)
WBC: 11.4 10*3/uL — ABNORMAL HIGH (ref 4.0–10.5)
nRBC: 0 % (ref 0.0–0.2)

## 2021-04-05 LAB — BLOOD GAS, ARTERIAL
Acid-base deficit: 5.8 mmol/L — ABNORMAL HIGH (ref 0.0–2.0)
Bicarbonate: 18.1 mmol/L — ABNORMAL LOW (ref 20.0–28.0)
Drawn by: 38235
FIO2: 35
O2 Saturation: 78.2 %
Patient temperature: 37
pCO2 arterial: 30.1 mmHg — ABNORMAL LOW (ref 32.0–48.0)
pH, Arterial: 7.397 (ref 7.350–7.450)
pO2, Arterial: 45.7 mmHg — ABNORMAL LOW (ref 83.0–108.0)

## 2021-04-05 LAB — BASIC METABOLIC PANEL
Anion gap: 7 (ref 5–15)
BUN: 29 mg/dL — ABNORMAL HIGH (ref 8–23)
CO2: 23 mmol/L (ref 22–32)
Calcium: 8.4 mg/dL — ABNORMAL LOW (ref 8.9–10.3)
Chloride: 104 mmol/L (ref 98–111)
Creatinine, Ser: 0.93 mg/dL (ref 0.61–1.24)
GFR, Estimated: >60 mL/min (ref >60)
Glucose, Bld: 88 mg/dL (ref 70–99)
Potassium: 4.4 mmol/L (ref 3.5–5.1)
Sodium: 134 mmol/L — ABNORMAL LOW (ref 135–145)

## 2021-04-05 LAB — PROCALCITONIN: Procalcitonin: 0.16 ng/mL

## 2021-04-05 LAB — RESP PANEL BY RT-PCR (FLU A&B, COVID) ARPGX2
Influenza A by PCR: NEGATIVE
Influenza B by PCR: NEGATIVE
SARS Coronavirus 2 by RT PCR: NEGATIVE

## 2021-04-05 MED ORDER — SODIUM CHLORIDE 0.9 % IV BOLUS
500.0000 mL | Freq: Once | INTRAVENOUS | Status: AC
  Administered 2021-04-05: 500 mL via INTRAVENOUS

## 2021-04-05 MED ORDER — LORAZEPAM 2 MG/ML IJ SOLN
0.5000 mg | Freq: Once | INTRAMUSCULAR | Status: AC
  Administered 2021-04-06: 0.5 mg via INTRAVENOUS
  Filled 2021-04-05: qty 1

## 2021-04-05 MED ORDER — LORAZEPAM 0.5 MG PO TABS
0.5000 mg | ORAL_TABLET | Freq: Three times a day (TID) | ORAL | Status: DC | PRN
  Administered 2021-04-05 – 2021-04-07 (×3): 0.5 mg via ORAL
  Filled 2021-04-05 (×3): qty 1

## 2021-04-05 MED ORDER — LORAZEPAM 2 MG/ML IJ SOLN
0.5000 mg | Freq: Once | INTRAMUSCULAR | Status: AC
  Administered 2021-04-05: 0.5 mg via INTRAVENOUS
  Filled 2021-04-05: qty 1

## 2021-04-05 NOTE — Progress Notes (Signed)
Patient is very agitated at this time. Pulling HFNC off and hollering. RN in room. Placed patient on NRB with HFNC to get sats up above 88%. Currently sats are 94%.

## 2021-04-05 NOTE — Progress Notes (Signed)
Physical Therapy Treatment Patient Details Name: [name] MRN: [id number] DOB: 1930-10-19 Today's Date: 04/05/2021    History of Present Illness Pt adm from SNF on 7/13 with hypoxia and hypotension. Pt found to have PE and heparin initiated 7/14. Pt also with sepsis due to lt elbow cellulitis. PMH - rt hip fx with ORIF 03/19/21,, myasthenia gravis, dementia, essential tremor,  bradycardia, solitary kidney, HTN, CAD, s/p ORIF of Rt humerus, pacer, seizure    PT Comments    Pt continuously moaning asking to be repositioned. Pt with very limited attention and constant redirection as well as limited eye opening. Pt only able to sit EOB but limited by desaturation to 79% with return to supine and sats back to 90% end of session on 15L HFNC. Will continue to follow and attempt as pt able to tolerate and medically stable.      Follow Up Recommendations  SNF     Equipment Recommendations  Wheelchair (measurements PT);Wheelchair cushion (measurements PT);Rolling walker with 5" wheels;Hospital bed    Recommendations for Other Services       Precautions / Restrictions Precautions Precautions: Fall;Other (comment) Precaution Comments: watch sats Restrictions RLE Weight Bearing: Weight bearing as tolerated    Mobility  Bed Mobility Overal bed mobility: Needs Assistance Bed Mobility: Supine to Sit;Sit to Supine     Supine to sit: Max assist;HOB elevated Sit to supine: Max assist   General bed mobility comments: max assist to roll to left with incontinent BM on arrival and assist to shift legs and trunk to sitting. Pt with desaturation to 79% on 15L HFNC with sitting and even with cues for calming and breathing technique pt unable to recover with return to bed.    Transfers                 General transfer comment: unable to attempt due to cognition and respiratory decline with transfers  Ambulation/Gait                 Stairs             Wheelchair  Mobility    Modified Rankin (Stroke Patients Only)       Balance Overall balance assessment: Needs assistance Sitting-balance support: No upper extremity supported;Feet supported Sitting balance-Leahy Scale: Poor Sitting balance - Comments: pt with right and left lean in sitting with inconsistent response to cues                                    Cognition Arousal/Alertness: Awake/alert Behavior During Therapy: Restless Overall Cognitive Status: No family/caregiver present to determine baseline cognitive functioning                                 General Comments: pt moaning stating needing to be repositioned but then will state "be quiet" "leave me alone" pt very inconsistent, not oriented, eyes closed 90% of session with constant redirection      Exercises      General Comments        Pertinent Vitals/Pain Pain Score: 5  Pain Location: generalized- pt moaning throughout Pain Descriptors / Indicators: Moaning Pain Intervention(s): Limited activity within patient's tolerance;Repositioned    Home Living                      Prior Function  PT Goals (current goals can now be found in the care plan section) Progress towards PT goals: Not progressing toward goals - comment (limited by cognition and repiratory function)    Frequency    Min 2X/week      PT Plan Current plan remains appropriate    Co-evaluation              AM-PAC PT "6 Clicks" Mobility   Outcome Measure  Help needed turning from your back to your side while in a flat bed without using bedrails?: A Lot Help needed moving from lying on your back to sitting on the side of a flat bed without using bedrails?: A Lot Help needed moving to and from a bed to a chair (including a wheelchair)?: Total Help needed standing up from a chair using your arms (e.g., wheelchair or bedside chair)?: Total Help needed to walk in hospital room?: Total Help  needed climbing 3-5 steps with a railing? : Total 6 Click Score: 8    End of Session   Activity Tolerance: Patient limited by fatigue Patient left: in bed;with call bell/phone within reach;with bed alarm set Nurse Communication: Mobility status PT Visit Diagnosis: Other abnormalities of gait and mobility (R26.89);Muscle weakness (generalized) (M62.81)     Time: 5003-7048 PT Time Calculation (min) (ACUTE ONLY): 17 min  Charges:  $Therapeutic Activity: 8-22 mins                     Dahlia Nifong P, PT Acute Rehabilitation Services Pager: [phone] Office: [place] Mckenzie Toruno 04/05/2021, 12:35 PM

## 2021-04-05 NOTE — Progress Notes (Signed)
[facility]  Date of Admission:  03/21/2021     CC: enterobacter bacteremia                                       Lines:  Peripheral iv's   Abx: 7/18-c bactrim iv 7/14-c cefepime  7/14-7/18 doxy                                                         Assessment: Enterobacter bacteremia Severe sepsis Hypoxemic resp distress/pna Pulm fibrosis Dvt/PE Myasthenia gravis      87 yo male s/p recent 7/02 ORIF for right hip fx, copd/pulm fibrosis, presence of pacemaker, chronic foley for bladder outlet obstruction & urinary retention, myasthenia gravis on chronic steroid, dementia, copd/pulm fibrosis, presence of pacemaker, admitted 7/13 with severe sepsis found and hypxemic respiratory distress to have enterobacter bacteremia, RLE DVT/subsegmental PE started on anticoagulation this admission   7/13 bcx enterobacter cloacae (S cefepime, cipro; R cefazolin, bactrim). Unclear source of the enterobacter cloacae. Urine culture was negative. Has been on abx for several days. No fever here. Mild leukocytosis in setting of chronic steroid. Chronic foley not yet changed this admission.    Gram negative bsi low risk pacemaker involvement. Tte no obvious veg. Given his MG/morbidity, with prompt resolution of sepsis, would defer tee. If recurrent bacteremia reasonable to w/u for lead infection then   No issue with orif site. I doubt infection complication from enterobacter bsi   Myasthenia gravis on chronic steroid   Hypoxemic resp distress in setting sepsis, copd/pulm fibrosis, PE and perhaps worsening myasthenia gravis. No obvious sx of pna, but team treating as such. Reasonable. Of note, patient has been on moderate dose chronic steroid, so reasonble to w/u pjp if hypoxemia is worse (per team yes as of 7/18)  ---------- 7/19 assessment Remains on high fio2 requirement 15L hfnc Started on bactrim pjp empiric tx 7/18 Off doxy  Back on home prednisone  20 mg daily Afebrile Wbc stable Clinically stable otherwise Pending fungitell  RT couldn't induce sputum    Plan:  F/u fungitel Continue bactrim If fungitell negative and no improvement on bactrim, would stop empiric bactrim Finish last day of cefepime on 7/20 Rest of management per primary team   I spent more than 35 minute reviewing data/chart, and coordinating care and >50% direct face to face time providing counseling/discussing diagnostics/treatment plan with patient   Active Problems:   Pulmonary emboli (HCC)   Acute respiratory failure with hypoxia (HCC)   Hypotension   Bacteremia   Severe sepsis without septic shock (HCC)   Allergies  Allergen Reactions   Cefuroxime Axetil Other (See Comments)    Reaction not recalled   Cyclosporine Other (See Comments)    Reaction not recalled   Elemental Sulfur Other (See Comments)    No energy, could not walk    Maxzide [Triamterene-Hctz] Other (See Comments)    Adverse reaction- bloating and abdominal pain   Sulfa Antibiotics Other (See Comments)    Could not sleep/eat and experienced gastric pain   Sulfamethoxazole Other (See Comments)    Could not sleep/eat and had gastric pain   Sulfamethoxazole-Trimethoprim Other (See Comments)  Gastric pain and could not eat/sleep   Prednisone Other (See Comments)    Can tolerate for a short period of time    Scheduled Meds:  albuterol  2.5 mg Nebulization TID   Chlorhexidine Gluconate Cloth  6 each Topical Daily   docusate sodium  100 mg Oral BID   feeding supplement  237 mL Oral BID BM   finasteride  5 mg Oral q morning   neomycin-bacitracin-polymyxin   Topical See admin instructions   pantoprazole  40 mg Oral Q supper   polyethylene glycol  17 g Oral BID   potassium chloride  40 mEq Oral BID   predniSONE  20 mg Oral Q breakfast   pyridostigmine  60 mg Oral 4 times per day   Rivaroxaban  15 mg Oral BID WC   Followed by   Derrill Memo ON 04/20/2021] rivaroxaban  20 mg Oral Q  supper   senna  1 tablet Oral Daily   tamsulosin  0.4 mg Oral Daily   Continuous Infusions:  ceFEPime (MAXIPIME) IV 2 g (04/05/21 1105)   sulfamethoxazole-trimethoprim 400 mg (04/05/21 1209)   PRN Meds:.acetaminophen, alum & mag hydroxide-simeth, bisacodyl, guaiFENesin-dextromethorphan, LORazepam, methocarbamol, ondansetron, oxyCODONE, risperiDONE   SUBJECTIVE: Feeling same regardign sob Keeps yelling out intermittently -- today said because his body aches No new joint pain No rash No cough/chest pain  Bactrim started 7/19 for empiric pjp coverage  Review of Systems: ROS All other ROS was negative, except mentioned above     OBJECTIVE: Vitals:   04/05/21 0720 04/05/21 0747 04/05/21 0800 04/05/21 1353  BP:  106/60 (!) 80/48   Pulse:  (!) 102 94 (!) 103  Resp:  (!) 29 (!) 21 (!) 24  Temp:  98.2 F (36.8 C)    TempSrc:      SpO2: 93%  91% 92%  Weight:      Height:       Body mass index is 27.28 kg/m.  Physical Exam General/constitutional: no distress, pleasant, occasional loud yell HEENT: Normocephalic, PER, Conj Clear, EOMI, Oropharynx clear Neck supple CV: rrr no mrg Lungs: coarse bs bilaterally Abd: Soft, Nontender Ext: no edema Skin: No Rash Neuro: nonfocal MSK: no peripheral joint swelling/tenderness/warmth; back spines nontender   Lab Results Lab Results  Component Value Date   WBC 11.4 (H) 04/05/2021   HGB 9.3 (L) 04/05/2021   HCT 30.5 (L) 04/05/2021   MCV 100.0 04/05/2021   PLT 208 04/05/2021    Lab Results  Component Value Date   CREATININE 0.93 04/05/2021   BUN 29 (H) 04/05/2021   NA 134 (L) 04/05/2021   K 4.4 04/05/2021   CL 104 04/05/2021   CO2 23 04/05/2021    Lab Results  Component Value Date   ALT 15 04/12/2021   AST 20 03/27/2021   ALKPHOS 92 03/25/2021   BILITOT 1.3 (H) 03/23/2021      Microbiology: Recent Results (from the past 240 hour(s))  Culture, blood (Routine x 2)     Status: Abnormal   Collection Time:  04/03/2021 12:55 PM   Specimen: BLOOD  Result Value Ref Range Status   Specimen Description BLOOD RIGHT ANTECUBITAL  Final   Special Requests   Final    BOTTLES DRAWN AEROBIC AND ANAEROBIC Blood Culture results may not be optimal due to an excessive volume of blood received in culture bottles   Culture  Setup Time   Final    GRAM NEGATIVE RODS AEROBIC BOTTLE ONLY CRITICAL RESULT CALLED TO, READ BACK BY  AND VERIFIED WITHAndres Shad [facility] 8527 03/31/21 A BROWNING Performed at [facility], [place] [address] [place], [place] [address]    Culture ENTEROBACTER CLOACAE (A)  Final   Report Status 04/02/2021 FINAL  Final   Organism ID, Bacteria ENTEROBACTER CLOACAE  Final      Susceptibility   Enterobacter cloacae - MIC*    CEFAZOLIN >=64 RESISTANT Resistant     CEFEPIME <=0.12 SENSITIVE Sensitive     CEFTAZIDIME <=1 SENSITIVE Sensitive     CIPROFLOXACIN <=0.25 SENSITIVE Sensitive     GENTAMICIN <=1 SENSITIVE Sensitive     IMIPENEM 1 SENSITIVE Sensitive     TRIMETH/SULFA >=320 RESISTANT Resistant     PIP/TAZO <=4 SENSITIVE Sensitive     * ENTEROBACTER CLOACAE  Blood Culture ID Panel (Reflexed)     Status: Abnormal   Collection Time: 03/25/2021 12:55 PM  Result Value Ref Range Status   Enterococcus faecalis NOT DETECTED NOT DETECTED Final   Enterococcus Faecium NOT DETECTED NOT DETECTED Final   Listeria monocytogenes NOT DETECTED NOT DETECTED Final   Staphylococcus species NOT DETECTED NOT DETECTED Final   Staphylococcus aureus (BCID) NOT DETECTED NOT DETECTED Final   Staphylococcus epidermidis NOT DETECTED NOT DETECTED Final   Staphylococcus lugdunensis NOT DETECTED NOT DETECTED Final   Streptococcus species NOT DETECTED NOT DETECTED Final   Streptococcus agalactiae NOT DETECTED NOT DETECTED Final   Streptococcus pneumoniae NOT DETECTED NOT DETECTED Final   Streptococcus pyogenes NOT DETECTED NOT DETECTED Final   A.calcoaceticus-baumannii NOT DETECTED NOT DETECTED Final   Bacteroides  fragilis NOT DETECTED NOT DETECTED Final   Enterobacterales DETECTED (A) NOT DETECTED Final    Comment: Enterobacterales represent a large order of gram negative bacteria, not a single organism.   Enterobacter cloacae complex DETECTED (A) NOT DETECTED Final    Comment: CRITICAL RESULT CALLED TO, READ BACK BY AND VERIFIED WITH: Andres Shad PHARMD 1327 03/31/21 A BROWNING    Escherichia coli NOT DETECTED NOT DETECTED Final   Klebsiella aerogenes NOT DETECTED NOT DETECTED Final   Klebsiella oxytoca NOT DETECTED NOT DETECTED Final   Klebsiella pneumoniae NOT DETECTED NOT DETECTED Final   Proteus species NOT DETECTED NOT DETECTED Final   Salmonella species NOT DETECTED NOT DETECTED Final   Serratia marcescens NOT DETECTED NOT DETECTED Final   Haemophilus influenzae NOT DETECTED NOT DETECTED Final   Neisseria meningitidis NOT DETECTED NOT DETECTED Final   Pseudomonas aeruginosa NOT DETECTED NOT DETECTED Final   Stenotrophomonas maltophilia NOT DETECTED NOT DETECTED Final   Candida albicans NOT DETECTED NOT DETECTED Final   Candida auris NOT DETECTED NOT DETECTED Final   Candida glabrata NOT DETECTED NOT DETECTED Final   Candida krusei NOT DETECTED NOT DETECTED Final   Candida parapsilosis NOT DETECTED NOT DETECTED Final   Candida tropicalis NOT DETECTED NOT DETECTED Final   Cryptococcus neoformans/gattii NOT DETECTED NOT DETECTED Final   CTX-M ESBL NOT DETECTED NOT DETECTED Final   Carbapenem resistance IMP NOT DETECTED NOT DETECTED Final   Carbapenem resistance KPC NOT DETECTED NOT DETECTED Final   Carbapenem resistance NDM NOT DETECTED NOT DETECTED Final   Carbapenem resist OXA 48 LIKE NOT DETECTED NOT DETECTED Final   Carbapenem resistance VIM NOT DETECTED NOT DETECTED Final    Comment: Performed at [facility], [address] [place], [place] [address]  Resp Panel by RT-PCR (Flu A&B, Covid) Nasopharyngeal Swab     Status: None   Collection Time: 04/04/2021  1:00 PM   Specimen:  Nasopharyngeal  Swab; Nasopharyngeal(NP) swabs in vial transport medium  Result Value Ref Range Status   SARS Coronavirus 2 by RT PCR NEGATIVE NEGATIVE Final    Comment: (NOTE) SARS-CoV-2 target nucleic acids are NOT DETECTED.  The SARS-CoV-2 RNA is generally detectable in upper respiratory specimens during the acute phase of infection. The lowest concentration of SARS-CoV-2 viral copies this assay can detect is 138 copies/mL. A negative result does not preclude SARS-Cov-2 infection and should not be used as the sole basis for treatment or other patient management decisions. A negative result may occur with  improper specimen collection/handling, submission of specimen other than nasopharyngeal swab, presence of viral mutation(s) within the areas targeted by this assay, and inadequate number of viral copies(<138 copies/mL). A negative result must be combined with clinical observations, patient history, and epidemiological information. The expected result is Negative.  Fact Sheet for Patients:  EntrepreneurPulse.com.au  Fact Sheet for Healthcare Providers:  IncredibleEmployment.be  This test is no t yet approved or cleared by the [place] FDA and  has been authorized for detection and/or diagnosis of SARS-CoV-2 by FDA under an Emergency Use Authorization (EUA). This EUA will remain  in effect (meaning this test can be used) for the duration of the COVID-19 declaration under Section 564(b)(1) of the Act, 21 U.S.C.section 360bbb-3(b)(1), unless the authorization is terminated  or revoked sooner.       Influenza A by PCR NEGATIVE NEGATIVE Final   Influenza B by PCR NEGATIVE NEGATIVE Final    Comment: (NOTE) The Xpert Xpress SARS-CoV-2/FLU/RSV plus assay is intended as an aid in the diagnosis of influenza from Nasopharyngeal swab specimens and should not be used as a sole basis for treatment. Nasal washings and aspirates are unacceptable for  Xpert Xpress SARS-CoV-2/FLU/RSV testing.  Fact Sheet for Patients: EntrepreneurPulse.com.au  Fact Sheet for Healthcare Providers: IncredibleEmployment.be  This test is not yet approved or cleared by the [place] FDA and has been authorized for detection and/or diagnosis of SARS-CoV-2 by FDA under an Emergency Use Authorization (EUA). This EUA will remain in effect (meaning this test can be used) for the duration of the COVID-19 declaration under Section 564(b)(1) of the Act, 21 U.S.C. section 360bbb-3(b)(1), unless the authorization is terminated or revoked.  Performed at [facility], [place] [address], [place], [place] [address]   Surgical pcr screen     Status: None   Collection Time: 03/26/2021  5:23 PM   Specimen: Nasal Mucosa; Nasal Swab  Result Value Ref Range Status   MRSA, PCR NEGATIVE NEGATIVE Final   Staphylococcus aureus NEGATIVE NEGATIVE Final    Comment: (NOTE) The Xpert SA Assay (FDA approved for NASAL specimens in patients 95 years of age and older), is one component of a comprehensive surveillance program. It is not intended to diagnose infection nor to guide or monitor treatment. Performed at [facility], [place] [address], [place], [place] [address]   Culture, blood (Routine x 2)     Status: None   Collection Time: 04/01/2021 10:53 PM   Specimen: BLOOD  Result Value Ref Range Status   Specimen Description BLOOD LEFT ANTECUBITAL  Final   Special Requests   Final    BOTTLES DRAWN AEROBIC ONLY Blood Culture adequate volume   Culture   Final    NO GROWTH 5 DAYS Performed at [facility], [place] [address], [place],  [address]    Report Status 04/04/2021 FINAL  Final  Urine Culture     Status: None  Collection Time: 03/31/21  6:33 AM   Specimen: Urine, Random  Result Value Ref Range Status   Specimen Description URINE, RANDOM  Final   Special Requests NONE  Final   Culture   Final    NO  GROWTH Performed at [facility], [place] [address] [place], [place] [address]    Report Status 04/02/2021 FINAL  Final  Culture, blood (routine x 2)     Status: None (Preliminary result)   Collection Time: 04/02/21  2:12 PM   Specimen: BLOOD  Result Value Ref Range Status   Specimen Description BLOOD BLOOD RIGHT HAND  Final   Special Requests   Final    AEROBIC BOTTLE ONLY Blood Culture results may not be optimal due to an inadequate volume of blood received in culture bottles   Culture   Final    NO GROWTH 3 DAYS Performed at [facility], [place] [address] [place], [place] [address]    Report Status PENDING  Incomplete  Culture, blood (routine x 2)     Status: None (Preliminary result)   Collection Time: 04/02/21  2:21 PM   Specimen: BLOOD  Result Value Ref Range Status   Specimen Description BLOOD BLOOD RIGHT HAND  Final   Special Requests   Final    AEROBIC BOTTLE ONLY Blood Culture results may not be optimal due to an inadequate volume of blood received in culture bottles   Culture   Final    NO GROWTH 3 DAYS Performed at [facility], [place] [address] [place], [place] [address]    Report Status PENDING  Incomplete     Serology:   Imaging: If present, new imagings (plain films, ct scans, and mri) have been personally visualized and interpreted; radiology reports have been reviewed. Decision making incorporated into the Impression / Recommendations.  7/14 tte  1. Left ventricular ejection fraction, by estimation, is 55 to 60%. The  left ventricle has normal function. The left ventricle has no regional  wall motion abnormalities. Left ventricular diastolic parameters are  consistent with Grade II diastolic  dysfunction (pseudonormalization).   2. Peak RV-RA gradient 60.5 mmHg. The IVC was poorly visualized. Right  ventricular systolic function is mildly reduced. The right ventricular  size is mildly enlarged.   3. Left atrial size was mildly dilated.    4. The mitral valve is normal in structure. Moderate mitral valve  regurgitation. No evidence of mitral stenosis.   5. The aortic valve is tricuspid. Aortic valve regurgitation is mild.  Mild aortic valve sclerosis is present, with no evidence of aortic valve  stenosis.   7/13 cta chest 1. Occlusive pulmonary embolus involving a segmental branch of the left lower lobe. 2. Chronic interstitial lung disease. 3.  Aortic Atherosclerosis (ICD10-I70.0). 4. Multi vessel coronary artery atherosclerosis.  Jabier Mutton, [place] for [facility]ious [place] [phone] pager    04/05/2021, 1:56 PM

## 2021-04-05 NOTE — TOC Progression Note (Signed)
Transition of Care [facility]) - Progression Note    Patient Details  Name: [name] MRN: [id number] Date of Birth: 1931-08-03  Transition of [facility]) CM/SW Contact  Reece Agar, [place] Phone Number: 04/05/2021, 12:35 PM  Clinical Narrative:    1232: CSW attempted to speak with admin at [facility] to follow up on possible DC or pt, no answer CSW will follow up when pt is ready for DC.  Expected Discharge Plan: [place] Barriers to Discharge: Continued Medical Work up  Expected Discharge Plan and Services Expected Discharge Plan: [place] In-house Referral: Clinical Social Work   Post Acute Care Choice: [place] Living arrangements for the past 2 months: [place]                                       Social Determinants of Health (SDOH) Interventions    Readmission Risk Interventions No flowsheet data found.

## 2021-04-05 NOTE — Progress Notes (Addendum)
NAME:  [name], MRN:  [id number], DOB:  Jun 28, 1931, LOS: 6 ADMISSION DATE:  03/24/2021, CONSULTATION DATE:  7/14 REFERRING MD:  Dr. Tyrell Antonio, CHIEF COMPLAINT:  hypotension; PE   History of Present Illness:  87 yo M w/ PMH of R hip fracture post ORIF 1 week ago, chronic foley catheter, myasthenia gravis on chronic steroid, HTN, BPH, dementia presents to [facility] on 7/13 with hypotension.  Patient was discharged from [facility] 1 week ago after ORIF of right hip. Discharged on aspirin and plavix. Patient developed right leg swelling and pain around 7/9. On 7/12, DVT ultrasound positive for fight leg DVT. Started on Xarelto.  On 7/14, patient was hypotensive and in respiratory distress at his rehab facility and was admitted to [facility]. Denies cough, fever, chills.  ED course: BP borderline low, hypoxia resolved on 2 l/m, CTA positive for LLL subsegmental PE. WBC 18, Na 126.  PCCM consulted on 7/14 to evaluate management for hypotension and hypoxemia.  Reconsulted on 7/17 for transient hypotension and increased work of breathing on 6 L/min.  Has been on anticoagulation, heparin was changed to Xarelto 7/16.  Found to have Enterobacter bacteremia and treated with doxycycline plus cefepime, has been on stress dose steroids with hydrocortisone given his chronic prednisone use.  Follow-up blood cultures 7/16 still negative.  CXR possible pneumonia versus pulmonary edema.  Oxygen requirement increased to 6 L/min on 7/16, has received diuretics.  I/O- 3.9 L total.   Pertinent  Medical History   Past Medical History:  Diagnosis Date   Carotid artery disease ([place])    Coronary artery disease    PCI RCA 2009   Dyslipidemia    Hypertension    Scleritis, unspecified    treated with prednisone   Seizure ([place])    felt due to cyclosporin in setting of electrolyte disorder   Solitary kidney    Symptomatic bradycardia    a. s/p STJ dual chamber pacemaker    Significant Hospital Events: Including procedures,  antibiotic start and stop dates in addition to other pertinent events   7/13: patient admitted to [facility] for hypotension and PE 7/14: PCCM consulted for hypotension. Responding to IV fluids.  7/17 PCCM consult for transient hypotension, increased work of breathing on 6 L/min 7/18 Some mild hypotension this AM (concern for compliance during BP check as BP appropriate when patient was reminded to remain still during check)  Interim History / Subjective:  Remains on 15L with borderline low SPO@ Continue to yell out very regular with no identify reason why RN reports little to no sleep overnight   Objective   Blood pressure (!) 80/48, pulse 94, temperature 98.2 F (36.8 C), resp. rate (!) 21, height 5\' 7"  (1.702 m), weight 79 kg, SpO2 91 %.    FiO2 (%):  [95 %] 95 %   Intake/Output Summary (Last 24 hours) at 04/05/2021 0844 Last data filed at 04/05/2021 0331 Gross per 24 hour  Intake 1050 ml  Output 1200 ml  Net -150 ml    Filed Weights   03/24/2021 1229 04/10/2021 2204  Weight: 73.5 kg 79 kg    Examination: General: Chronically ill appearing elderly male lying in bed in NAD HEENT: [place]/AT, MM pink/moist, PERRL,  Neuro: Alert but very confused with disorganized thinking  CV: s1s2 regular rate and rhythm, no murmur, rubs, or gallops,  PULM:  Diminished air entry bilateral, shallow breaths, no increased work of breathing, no added breath sounds  GI: soft, bowel sounds active in all 4  quadrants, non-tender, non-distended Extremities: warm/dry, no  edema  Skin: no rashes or lesions  Resolved Hospital Problem list     Assessment & Plan:   Sepsis due to Enterobacter bacteremia and possible pneumonia Enterobacter bacteremia Bilateral pulmonary infiltrates, question pneumonia -Remains hemodynamically stable. Initially had some low BP readings with diuresis + sepsis.  P: Continued ABT plan per primary/ID Continue empiric antifungal coverage  Continue home steroid therapy, Prednisone 20mg   daily  Acute PE / DVT  -CTA 7/13 occlusive PE LLL with no signs of heart strain; Echo reassuring. Started on Xarelto 3 days prior to admission P: Anticoagulated with Xarelto   Monitor for signs of bleeding   Acute hypoxic resp failure: Multifactorial including pulmonary embolism, bilateral pulmonary infiltrates superimposed on interstitial disease.   Consider component of restrictive physiology due to respiratory muscle weakness given his myasthenia gravis, overall debilitated state due to ORIF, hospitalization.   -CT chest 7/13: shows chronic interstitial lung disease.  Subsequent chest x-ray with evolving bibasilar infiltrates superimposed on his ILD.  Consider pneumonia as above, aspiration, consider atelectasis P: Wean supplemental  oxygen for sats greater than 90%. Head of bed elevated Ensure adequate pulmonary hygiene  Follow cultures  Mobilize as able  Treatment for sepsis as above  Remains too high risk given high oxygen need for bronch   MG, chronic steroid use, hyponatremia, recent ORIF P: Continue Prednisone and mestinon  Best Practice   Diet/type: Regular consistency (see orders) DVT prophylaxis: DOAC GI prophylaxis: N/A Lines: N/A Foley:  Yes, and it is still needed Code Status:  full code Last date of multidisciplinary goals of care discussion - per primary, consider palliative discussion given overall illness burden and acute needs.    Critical care time: n/a  Whitney D. Kenton Kingfisher, NP-C [facility] Personal contact information can be found on Amion  04/05/2021, 9:20 AM  --------------------------------  Attending note: I have seen and examined the patient. History, labs and imaging reviewed.  87 year old with right hip fracture, myasthenia gravis on chronic steroids, hypertension, left lower lobe subsegmental PE on anticoagulation. On 15 L high flow nasal cannula.  More confused and delirious today  Blood pressure (!) 80/48, pulse 88,  temperature 98.2 F (36.8 C), resp. rate 19, height 5\' 7"  (1.702 m), weight 79 kg, SpO2 92 %. Gen:      No acute distress, chronically ill-appearing HEENT:  EOMI, sclera anicteric Neck:     No masses; no thyromegaly Lungs:    Clear to auscultation bilaterally; normal respiratory effort CV:         Regular rate and rhythm; no murmurs Abd:      + bowel sounds; soft, non-tender; no palpable masses, no distension Ext:    No edema; adequate peripheral perfusion Skin:      Warm and dry; no rash Neuro: Sleeping, arousable  Assessment/plan: Sepsis secondary to Enterobacter bacteremia Acute respiratory failure secondary to HAP, subsegmental PE Concern for opportunistic infection given chronic steroid use  Review of his CT shows underlying pulmonary fibrosis. On 15 L high flow nasal cannula right now Continue antibiotics, Lasix Started empiric treatment for pneumocystis given chronic immunosuppression He is too unstable for bronchoscopy at this moment.   Goals of care Full code for now.  Palliative care consulted.  Marshell Garfinkel MD [facility] 04/05/2021, 6:06 PM

## 2021-04-05 NOTE — Progress Notes (Signed)
About 1400, patient yelling, agitated, trying to crawl out of bed. Dr. Tyrell Antonio at bedside, verbally ordered 0.25 Ativan. Pulled 0.5 Ativan from [facility] (ordered for bedtime at 0.5). 0.25 given to patient. 0.25 wasted with Gwenyth Bouillon, RN, charge nurse. Effective. Patient fell asleep.

## 2021-04-05 NOTE — Progress Notes (Addendum)
PROGRESS NOTE    [name]  ZOX:[id number] DOB: [birth year] DOA: 03/26/2021 PCP: Shirline Frees, MD   Brief Narrative: 86 year old with past medical history significant for right hip fracture status post ORIF with intramedullary nailing, urinary retention status post Foley catheter, myasthenia gravis on chronic Steroids, hypertension, BPH, dementia, essential tremor presents with hypotension.  Patient was recently discharged from the hospital 1 week ago after ORIF of the right hip.  Patient over the last 2 or 3 days developed right leg swelling and pain.  Doppler lower extremity was positive for DVT.  Patient was a started on Xarelto overnight at SNF>   The morning of admission patient was found to be in respiratory distress and an low blood pressure.  Patient was referred to the ED for further evaluation.  Patient was noted to have a rash on his left elbow.  Evaluation in the ED patient was noted to have borderline low blood pressure, hypoxia.  CTA positive for left lower lobe subsegmental PE.  Patient admitted with PE, DVT, acute hypoxic respiratory failure multifactorial secondary to pulmonary edema, PNA, ILD. He was found to have Enterobacter Cloaceae bacteremia, sepsis, and delirium.   He continue to required HF oxygen 15 L. He was started On IV bactrim to treat for PJP on 7/18. He was on stress dose steroids, transition to prednisone chronic home dose 7/19. He continue to be confuse. CCM is following.   Assessment & Plan:   Active Problems:   Pulmonary emboli (HCC)   Acute respiratory failure with hypoxia (HCC)   Hypotension   Bacteremia   Severe sepsis without septic shock (HCC)   1-Subsegmental left lower lobe PE, DVT,  Acute Hypoxic Respiratory Failure.  Acute pulmonary edema secondary to acute diastolic HF exacerbation:  PNA -Patient was treated initially on  heparin drip.  -He was  transition to [place] 7/16. -Chest X-ray 7/15: Progressive interstitial and ground  glass opacities consistent with worsening edema or infection.  -Started on IV lasix 7/15 to help with pulmonary edema.  -Increase oxygen requirement on 7/17 up to 6 L. Repeated chest x ray Showed: similar appearance favored to reflect severe multilobar PNA> \-Pulmonologist consulted.  -Oxygen requirement increase to  15 L oxygen on 7/18, worsening hypoxemia.  -ID consulted: checking Fungitell pending. and Pneumocystis smear.  -Started on IV bactrim to cover for PJP 7/18.  2-Anemia;  Hb down to 7.6. No evidence of active bleeding.  Received one unit PRBC.  Prior HD; 7.9 July 5.  Hb stable. 9.3 Stable.   3-Sepsis, 1/2 Blood culture positive for Enterobacter Cloaceae.  Presents with leukocytosis, hypotension, left elbow cellulitis.  On IV steroid.  Started on Cefepime 7/14. Doxy DC because Bactim was started on 7/18 Repeated Blood culture 7/16: no growth to date.  Continue cefepime.  Hypotension; patient BP low this am in the 80. Lasix discontinue, IV bolus given. CCM informed.   Abdominal pain;  On IV protonix.  KUB: mild ileus, Moderate stool in the colon including moderate retained feces at the rectum. Had BM 7/14, 7/15. Continue with miralax, senna, PRN supp.  Resolved after BM  Chronic steroid use;  On chronic prednisone.  Received  IV hydrocortisone. Transition to chronic prednisone 7/19   Hyponatremia:  Received IV fluids.  Monitor.   Recent ORIF, Right Hip:  Ortho : Dr Victorino December.  Evaluated by Ortho continue with staples and dry dressing PRN  Chronic Indwelling Foley catheter:  Needs to follow up with urology.  exchange foley  Delirium;  Risperidone to BID PRN agitation.   Patient with multiples medical problems, Acute hypoxic respiratory failure, ILD, PNA, PE.  Palliative care consulted for goals of care.   Goal  of care;  Discussed with patient daughter, patient respiratory status continue to get worse. He is not improving on antibiotics, Steroids,  lasix. He is requiring 35 L oxygen. I encourage her to consider DNR/DNI. I recommend Comfort care. She will discussed with her sister and she agrees to speak with palliative care. Plan to do ativan TID PRN for agitation understanding risk for respiratory suppression and lethargic.    Estimated body mass index is 27.28 kg/m as calculated from the following:   Height as of this encounter: 5\' 7"  (1.702 m).   Weight as of this encounter: 79 kg.   DVT prophylaxis: Heparin gtt Code Status: Full code Family Communication: Daughter updated. 7/18, will updated daughter today  Disposition Plan:  Status is: Inpatient  Remains inpatient appropriate because:Hemodynamically unstable  Dispo: The patient is from: SNF              Anticipated d/c is to: SNF              Patient currently is not medically stable to d/c.   Difficult to place patient No        Consultants:  None  Procedures:  ECHO P  Antimicrobials:    Subjective: He was not able to sleep last night.  He continue to be agitated. With his increase oxygen requirement I would like to avoid sedatives.  His BP was low this am.    Objective: Vitals:   04/04/21 2126 04/04/21 2338 04/04/21 2340 04/05/21 0439  BP:   135/70 106/90  Pulse:   99 88  Resp:   (!) 29 15  Temp:  97.8 F (36.6 C) 97.8 F (36.6 C) 97.8 F (36.6 C)  TempSrc:  (P) Oral Oral Oral  SpO2: 90%  90% 93%  Weight:      Height:        Intake/Output Summary (Last 24 hours) at 04/05/2021 0718 Last data filed at 04/05/2021 0331 Gross per 24 hour  Intake 1050 ml  Output 1200 ml  Net -150 ml    Filed Weights   04/15/2021 1229 03/18/2021 2204  Weight: 73.5 kg 79 kg    Examination:  General exam: NAD Respiratory system: BL crackles.  Cardiovascular system: S 1, S 2 RRR Gastrointestinal system:  BS present, soft , nt Central nervous system: Alert, confuse,  Extremities: clean dressing right hip    Data Reviewed: I have personally reviewed  following labs and imaging studies  CBC: Recent Labs  Lab 04/14/2021 1255 03/31/21 0057 04/01/21 0532 04/02/21 0034 04/03/21 0032 04/04/21 0020 04/05/21 0102  WBC 18.0*   < > 12.2* 12.4* 14.9* 14.9* 11.4*  NEUTROABS 16.8*  --   --   --   --   --   --   HGB 9.1*   < > 8.2* 8.9* 9.5* 9.9* 9.3*  HCT 28.8*   < > 26.0* 28.0* 30.0* 31.8* 30.5*  MCV 104.0*   < > 98.1 95.9 96.2 98.1 100.0  PLT 231   < > 199 201 207 226 208   < > = values in this interval not displayed.    Basic Metabolic Panel: Recent Labs  Lab 04/01/21 0726 04/02/21 0034 04/03/21 0032 04/04/21 0020 04/05/21 0102  NA 134* 134* 135 136 134*  K 3.8 3.3* 3.5 3.7 4.4  CL  105 103 101 101 104  CO2 19* 23 24 25 23   GLUCOSE 137* 144* 135* 122* 88  BUN 16 16 18  25* 29*  CREATININE 0.93 0.91 0.90 1.00 0.93  CALCIUM 8.7* 8.6* 8.6* 8.9 8.4*    GFR: Estimated Creatinine Clearance: 51.3 mL/min (by C-G formula based on SCr of 0.93 mg/dL). Liver Function Tests: Recent Labs  Lab 03/27/2021 1255  AST 20  ALT 15  ALKPHOS 92  BILITOT 1.3*  PROT 5.6*  ALBUMIN 2.5*    No results for input(s): LIPASE, AMYLASE in the last 168 hours. No results for input(s): AMMONIA in the last 168 hours. Coagulation Profile: Recent Labs  Lab 03/22/2021 1255  INR 2.6*    Cardiac Enzymes: No results for input(s): CKTOTAL, CKMB, CKMBINDEX, TROPONINI in the last 168 hours. BNP (last 3 results) No results for input(s): PROBNP in the last 8760 hours. HbA1C: No results for input(s): HGBA1C in the last 72 hours. CBG: No results for input(s): GLUCAP in the last 168 hours. Lipid Profile: No results for input(s): CHOL, HDL, LDLCALC, TRIG, CHOLHDL, LDLDIRECT in the last 72 hours. Thyroid Function Tests: No results for input(s): TSH, T4TOTAL, FREET4, T3FREE, THYROIDAB in the last 72 hours. Anemia Panel: No results for input(s): VITAMINB12, FOLATE, FERRITIN, TIBC, IRON, RETICCTPCT in the last 72 hours.  Sepsis Labs: Recent Labs  Lab  03/31/21 1354 03/31/21 1935 04/03/21 0032 04/03/21 1230 04/04/21 0020 04/05/21 0102  PROCALCITON  --   --  <0.10  --  0.12 0.16  LATICACIDVEN 3.6* 2.4*  --  1.9 2.1*  --      Recent Results (from the past 240 hour(s))  Culture, blood (Routine x 2)     Status: Abnormal   Collection Time: 03/21/2021 12:55 PM   Specimen: BLOOD  Result Value Ref Range Status   Specimen Description BLOOD RIGHT ANTECUBITAL  Final   Special Requests   Final    BOTTLES DRAWN AEROBIC AND ANAEROBIC Blood Culture results may not be optimal due to an excessive volume of blood received in culture bottles   Culture  Setup Time   Final    GRAM NEGATIVE RODS AEROBIC BOTTLE ONLY CRITICAL RESULT CALLED TO, READ BACK BY AND VERIFIED WITHAndres Shad PHARMD 1327 03/31/21 A BROWNING Performed at [facility], [place] [address], [place], [place] [address]    Culture ENTEROBACTER CLOACAE (A)  Final   Report Status 04/02/2021 FINAL  Final   Organism ID, Bacteria ENTEROBACTER CLOACAE  Final      Susceptibility   Enterobacter cloacae - MIC*    CEFAZOLIN >=64 RESISTANT Resistant     CEFEPIME <=0.12 SENSITIVE Sensitive     CEFTAZIDIME <=1 SENSITIVE Sensitive     CIPROFLOXACIN <=0.25 SENSITIVE Sensitive     GENTAMICIN <=1 SENSITIVE Sensitive     IMIPENEM 1 SENSITIVE Sensitive     TRIMETH/SULFA >=320 RESISTANT Resistant     PIP/TAZO <=4 SENSITIVE Sensitive     * ENTEROBACTER CLOACAE  Blood Culture ID Panel (Reflexed)     Status: Abnormal   Collection Time: 04/13/2021 12:55 PM  Result Value Ref Range Status   Enterococcus faecalis NOT DETECTED NOT DETECTED Final   Enterococcus Faecium NOT DETECTED NOT DETECTED Final   Listeria monocytogenes NOT DETECTED NOT DETECTED Final   Staphylococcus species NOT DETECTED NOT DETECTED Final   Staphylococcus aureus (BCID) NOT DETECTED NOT DETECTED Final   Staphylococcus epidermidis NOT DETECTED NOT DETECTED Final   Staphylococcus lugdunensis NOT DETECTED NOT DETECTED Final  Streptococcus species NOT DETECTED NOT DETECTED Final   Streptococcus agalactiae NOT DETECTED NOT DETECTED Final   Streptococcus pneumoniae NOT DETECTED NOT DETECTED Final   Streptococcus pyogenes NOT DETECTED NOT DETECTED Final   A.calcoaceticus-baumannii NOT DETECTED NOT DETECTED Final   Bacteroides fragilis NOT DETECTED NOT DETECTED Final   Enterobacterales DETECTED (A) NOT DETECTED Final    Comment: Enterobacterales represent a large order of gram negative bacteria, not a single organism.   Enterobacter cloacae complex DETECTED (A) NOT DETECTED Final    Comment: CRITICAL RESULT CALLED TO, READ BACK BY AND VERIFIED WITH: Andres Shad PHARMD 1327 03/31/21 A BROWNING    Escherichia coli NOT DETECTED NOT DETECTED Final   Klebsiella aerogenes NOT DETECTED NOT DETECTED Final   Klebsiella oxytoca NOT DETECTED NOT DETECTED Final   Klebsiella pneumoniae NOT DETECTED NOT DETECTED Final   Proteus species NOT DETECTED NOT DETECTED Final   Salmonella species NOT DETECTED NOT DETECTED Final   Serratia marcescens NOT DETECTED NOT DETECTED Final   Haemophilus influenzae NOT DETECTED NOT DETECTED Final   Neisseria meningitidis NOT DETECTED NOT DETECTED Final   Pseudomonas aeruginosa NOT DETECTED NOT DETECTED Final   Stenotrophomonas maltophilia NOT DETECTED NOT DETECTED Final   Candida albicans NOT DETECTED NOT DETECTED Final   Candida auris NOT DETECTED NOT DETECTED Final   Candida glabrata NOT DETECTED NOT DETECTED Final   Candida krusei NOT DETECTED NOT DETECTED Final   Candida parapsilosis NOT DETECTED NOT DETECTED Final   Candida tropicalis NOT DETECTED NOT DETECTED Final   Cryptococcus neoformans/gattii NOT DETECTED NOT DETECTED Final   CTX-M ESBL NOT DETECTED NOT DETECTED Final   Carbapenem resistance IMP NOT DETECTED NOT DETECTED Final   Carbapenem resistance KPC NOT DETECTED NOT DETECTED Final   Carbapenem resistance NDM NOT DETECTED NOT DETECTED Final   Carbapenem resist OXA 48 LIKE NOT  DETECTED NOT DETECTED Final   Carbapenem resistance VIM NOT DETECTED NOT DETECTED Final    Comment: Performed at [facility], [address] [place], [place] [address]  Resp Panel by RT-PCR (Flu A&B, Covid) Nasopharyngeal Swab     Status: None   Collection Time: 03/21/2021  1:00 PM   Specimen: Nasopharyngeal Swab; Nasopharyngeal(NP) swabs in vial transport medium  Result Value Ref Range Status   SARS Coronavirus 2 by RT PCR NEGATIVE NEGATIVE Final    Comment: (NOTE) SARS-CoV-2 target nucleic acids are NOT DETECTED.  The SARS-CoV-2 RNA is generally detectable in upper respiratory specimens during the acute phase of infection. The lowest concentration of SARS-CoV-2 viral copies this assay can detect is 138 copies/mL. A negative result does not preclude SARS-Cov-2 infection and should not be used as the sole basis for treatment or other patient management decisions. A negative result may occur with  improper specimen collection/handling, submission of specimen other than nasopharyngeal swab, presence of viral mutation(s) within the areas targeted by this assay, and inadequate number of viral copies(<138 copies/mL). A negative result must be combined with clinical observations, patient history, and epidemiological information. The expected result is Negative.  Fact Sheet for Patients:  EntrepreneurPulse.com.au  Fact Sheet for Healthcare Providers:  IncredibleEmployment.be  This test is no t yet approved or cleared by the [place] FDA and  has been authorized for detection and/or diagnosis of SARS-CoV-2 by FDA under an Emergency Use Authorization (EUA). This EUA will remain  in effect (meaning this test can be used) for the duration of the COVID-19 declaration under Section 564(b)(1) of the Act, 21 U.S.C.section 360bbb-3(b)(1),  unless the authorization is terminated  or revoked sooner.       Influenza A by PCR NEGATIVE NEGATIVE Final    Influenza B by PCR NEGATIVE NEGATIVE Final    Comment: (NOTE) The Xpert Xpress SARS-CoV-2/FLU/RSV plus assay is intended as an aid in the diagnosis of influenza from Nasopharyngeal swab specimens and should not be used as a sole basis for treatment. Nasal washings and aspirates are unacceptable for Xpert Xpress SARS-CoV-2/FLU/RSV testing.  Fact Sheet for Patients: EntrepreneurPulse.com.au  Fact Sheet for Healthcare Providers: IncredibleEmployment.be  This test is not yet approved or cleared by the [place] FDA and has been authorized for detection and/or diagnosis of SARS-CoV-2 by FDA under an Emergency Use Authorization (EUA). This EUA will remain in effect (meaning this test can be used) for the duration of the COVID-19 declaration under Section 564(b)(1) of the Act, 21 U.S.C. section 360bbb-3(b)(1), unless the authorization is terminated or revoked.  Performed at [facility], [place] [address] [place], [place] [address]   Surgical pcr screen     Status: None   Collection Time: 04/14/2021  5:23 PM   Specimen: Nasal Mucosa; Nasal Swab  Result Value Ref Range Status   MRSA, PCR NEGATIVE NEGATIVE Final   Staphylococcus aureus NEGATIVE NEGATIVE Final    Comment: (NOTE) The Xpert SA Assay (FDA approved for NASAL specimens in patients 25 years of age and older), is one component of a comprehensive surveillance program. It is not intended to diagnose infection nor to guide or monitor treatment. Performed at [facility], [place] [address] [place], [place] [address]   Culture, blood (Routine x 2)     Status: None   Collection Time: 04/07/2021 10:53 PM   Specimen: BLOOD  Result Value Ref Range Status   Specimen Description BLOOD LEFT ANTECUBITAL  Final   Special Requests   Final    BOTTLES DRAWN AEROBIC ONLY Blood Culture adequate volume   Culture   Final    NO GROWTH 5 DAYS Performed at [facility], [place] [address]  [place], [place] [address]    Report Status 04/04/2021 FINAL  Final  Urine Culture     Status: None   Collection Time: 03/31/21  6:33 AM   Specimen: Urine, Random  Result Value Ref Range Status   Specimen Description URINE, RANDOM  Final   Special Requests NONE  Final   Culture   Final    NO GROWTH Performed at [facility], [place] [address] [place], [place] [address]    Report Status 04/02/2021 FINAL  Final  Culture, blood (routine x 2)     Status: None (Preliminary result)   Collection Time: 04/02/21  2:12 PM   Specimen: BLOOD  Result Value Ref Range Status   Specimen Description BLOOD BLOOD RIGHT HAND  Final   Special Requests   Final    AEROBIC BOTTLE ONLY Blood Culture results may not be optimal due to an inadequate volume of blood received in culture bottles   Culture   Final    NO GROWTH 3 DAYS Performed at [facility], [place] [address], [place], [place] [address]    Report Status PENDING  Incomplete  Culture, blood (routine x 2)     Status: None (Preliminary result)   Collection Time: 04/02/21  2:21 PM   Specimen: BLOOD  Result Value Ref Range Status   Specimen Description BLOOD BLOOD RIGHT HAND  Final   Special Requests  Final    AEROBIC BOTTLE ONLY Blood Culture results may not be optimal due to an inadequate volume of blood received in culture bottles   Culture   Final    NO GROWTH 3 DAYS Performed at [facility], [place] [address], [place], [place] [address]    Report Status PENDING  Incomplete          Radiology Studies: DG Chest Port 1 View  Result Date: 04/04/2021 CLINICAL DATA:  Shortness of breath.  Hypoxia EXAM: PORTABLE CHEST 1 VIEW COMPARISON:  One-view chest x-ray 7/17 04/01/2021. Two-view chest x-ray 03/29/2021. FINDINGS: Heart size is exaggerated by low lung volumes. Atherosclerotic changes are present at the arch. Pacemaker leads are noted. Diffuse interstitial and airspace disease remains elevated over baseline. Findings remain  concerning for edema or infection. Left pleural effusion suspected. IMPRESSION: 1. Stable appearance of diffuse interstitial and airspace disease concerning for edema or infection. 2. Probable left pleural effusion. Electronically Signed   By: San Morelle M.D.   On: 04/04/2021 08:41   DG CHEST PORT 1 VIEW  Result Date: 04/03/2021 CLINICAL DATA:  87 year old male with history of hypoxemia. Hypertension. EXAM: PORTABLE CHEST 1 VIEW COMPARISON:  Chest x-ray 04/01/2021. FINDINGS: Lung volumes are low. Patchy multifocal airspace consolidation widespread areas of interstitial prominence are again noted throughout the lungs bilaterally. Emphysematous changes are also noted. Small left pleural effusion. No definite right pleural effusion. No pneumothorax. Cardiac silhouette is partially obscured, but heart size appears normal. Upper mediastinal contours are within normal limits. Atherosclerotic calcifications in the thoracic aorta. Left-sided pacemaker device in place with lead tips projecting over the expected location of the right atrium and right ventricle. IMPRESSION: 1. The appearance the chest is very similar to recent prior studies, favored to reflect severe multilobar bilateral pneumonia (left greater than right). 2. Small left pleural effusion. 3. Aortic atherosclerosis. Electronically Signed   By: Vinnie Langton M.D.   On: 04/03/2021 08:40        Scheduled Meds:  albuterol  2.5 mg Nebulization TID   Chlorhexidine Gluconate Cloth  6 each Topical Daily   docusate sodium  100 mg Oral BID   feeding supplement  237 mL Oral BID BM   finasteride  5 mg Oral q morning   furosemide  20 mg Intravenous Daily   neomycin-bacitracin-polymyxin   Topical See admin instructions   pantoprazole  40 mg Oral Q supper   polyethylene glycol  17 g Oral BID   potassium chloride  40 mEq Oral BID   predniSONE  20 mg Oral Q breakfast   pyridostigmine  60 mg Oral 4 times per day   Rivaroxaban  15 mg Oral BID  WC   Followed by   Derrill Memo ON 04/20/2021] rivaroxaban  20 mg Oral Q supper   senna  1 tablet Oral Daily   tamsulosin  0.4 mg Oral Daily   Continuous Infusions:  ceFEPime (MAXIPIME) IV 2 g (04/04/21 2152)   sulfamethoxazole-trimethoprim 400 mg (04/05/21 0650)     LOS: 6 days    Time spent: 35 minutes,.     Elmarie Shiley, MD [facility]   If 7PM-7AM, please contact night-coverage [email or web address]  04/05/2021, 7:18 AM

## 2021-04-05 NOTE — Progress Notes (Signed)
1500: Patient desaturated into 60s. Upon entering room, he was found to have pulled his oxygen cannula off. Patient is on high flow heated O2. Patient also found to be incontinent of stool, large amount, type 6.   patient was disoriented and combative with staff while cleaning him up and was fighting having the oxygen reapplied. Oxygen was with reapplied with assist of 3. Mitts were applied to hands for safety.   During patient fighting cares, tape holding the IV in place, began to rip away and caused a skin tear to patient's right forearm area. Zeroform and foam applied. Continue to monitor.

## 2021-04-05 NOTE — Progress Notes (Signed)
Patient refused 2000 Albuterol HHN. RN made aware. Patient doing well at this time on HHFNC and breath sounds are diminished and clear.

## 2021-04-06 ENCOUNTER — Inpatient Hospital Stay (HOSPITAL_COMMUNITY): Payer: Medicare Other

## 2021-04-06 DIAGNOSIS — J9601 Acute respiratory failure with hypoxia: Secondary | ICD-10-CM | POA: Diagnosis not present

## 2021-04-06 DIAGNOSIS — J181 Lobar pneumonia, unspecified organism: Secondary | ICD-10-CM | POA: Diagnosis not present

## 2021-04-06 DIAGNOSIS — Z515 Encounter for palliative care: Secondary | ICD-10-CM

## 2021-04-06 DIAGNOSIS — D849 Immunodeficiency, unspecified: Secondary | ICD-10-CM

## 2021-04-06 DIAGNOSIS — I2699 Other pulmonary embolism without acute cor pulmonale: Secondary | ICD-10-CM | POA: Diagnosis not present

## 2021-04-06 DIAGNOSIS — R7881 Bacteremia: Secondary | ICD-10-CM | POA: Diagnosis not present

## 2021-04-06 LAB — BASIC METABOLIC PANEL
Anion gap: 12 (ref 5–15)
BUN: 29 mg/dL — ABNORMAL HIGH (ref 8–23)
CO2: 16 mmol/L — ABNORMAL LOW (ref 22–32)
Calcium: 8.7 mg/dL — ABNORMAL LOW (ref 8.9–10.3)
Chloride: 102 mmol/L (ref 98–111)
Creatinine, Ser: 1.15 mg/dL (ref 0.61–1.24)
GFR, Estimated: >60 mL/min (ref >60)
Glucose, Bld: 93 mg/dL (ref 70–99)
Potassium: 5.5 mmol/L — ABNORMAL HIGH (ref 3.5–5.1)
Sodium: 130 mmol/L — ABNORMAL LOW (ref 135–145)

## 2021-04-06 LAB — CBC
HCT: 31.7 % — ABNORMAL LOW (ref 39.0–52.0)
Hemoglobin: 9.8 g/dL — ABNORMAL LOW (ref 13.0–17.0)
MCH: 30.6 pg (ref 26.0–34.0)
MCHC: 30.9 g/dL (ref 30.0–36.0)
MCV: 99.1 fL (ref 80.0–100.0)
Platelets: 155 10*3/uL (ref 150–400)
RBC: 3.2 MIL/uL — ABNORMAL LOW (ref 4.22–5.81)
RDW: 19 % — ABNORMAL HIGH (ref 11.5–15.5)
WBC: 15.9 10*3/uL — ABNORMAL HIGH (ref 4.0–10.5)
nRBC: 0 % (ref 0.0–0.2)

## 2021-04-06 MED ORDER — FUROSEMIDE 10 MG/ML IJ SOLN: 40.0000 mg | Freq: Once | INTRAMUSCULAR | Status: DC

## 2021-04-06 MED ORDER — PREDNISONE 20 MG PO TABS
40.0000 mg | ORAL_TABLET | Freq: Every day | ORAL | Status: DC
  Administered 2021-04-07: 40 mg via ORAL
  Filled 2021-04-06: qty 2

## 2021-04-06 MED ORDER — SULFAMETHOXAZOLE-TRIMETHOPRIM 400-80 MG/5ML IV SOLN
400.0000 mg | Freq: Three times a day (TID) | INTRAVENOUS | Status: DC
  Administered 2021-04-06 – 2021-04-07 (×2): 400 mg via INTRAVENOUS
  Filled 2021-04-06 (×5): qty 25

## 2021-04-06 MED ORDER — PREDNISONE 20 MG PO TABS
20.0000 mg | ORAL_TABLET | Freq: Every day | ORAL | Status: AC
  Administered 2021-04-06: 20 mg via ORAL
  Filled 2021-04-06: qty 1

## 2021-04-06 MED ORDER — ALBUMIN HUMAN 25 % IV SOLN
25.0000 g | Freq: Four times a day (QID) | INTRAVENOUS | Status: DC
Start: 2021-04-06 — End: 2021-04-07
  Administered 2021-04-06 – 2021-04-07 (×3): 25 g via INTRAVENOUS
  Filled 2021-04-06 (×4): qty 100

## 2021-04-06 MED ORDER — ACETAMINOPHEN 500 MG PO TABS
1000.0000 mg | ORAL_TABLET | Freq: Three times a day (TID) | ORAL | Status: DC
  Administered 2021-04-06 – 2021-04-07 (×2): 1000 mg via ORAL
  Filled 2021-04-06 (×2): qty 2

## 2021-04-06 MED ORDER — PREDNISONE 20 MG PO TABS: 20.0000 mg | ORAL_TABLET | Freq: Every day | ORAL | Status: DC

## 2021-04-06 MED ORDER — FUROSEMIDE 10 MG/ML IJ SOLN
20.0000 mg | Freq: Once | INTRAMUSCULAR | Status: AC
  Administered 2021-04-06: 20 mg via INTRAVENOUS
  Filled 2021-04-06: qty 2

## 2021-04-06 NOTE — Progress Notes (Signed)
Pt extremely agitated- removed NRB and refused to have it put back on- he still had HFNC on, but sats continued to stay in 70s-80s and HR in 110-120s gave pt ordered Ativan with no result. Paged [facility] on call with this information- see orders

## 2021-04-06 NOTE — Progress Notes (Signed)
[facility]  Date of Admission:  03/31/2021     CC: enterobacter bacteremia                                       Lines:  Peripheral iv's   Abx: 7/18-c bactrim iv 7/14-c cefepime  7/14-7/18 doxy                                                         Assessment: Enterobacter bacteremia Severe sepsis Hypoxemic resp distress/pna Chronic prednisone use >15mg /day Pulm fibrosis Dvt/PE Myasthenia gravis      87 yo male s/p recent 7/02 ORIF for right hip fx, copd/pulm fibrosis, presence of pacemaker, chronic foley for bladder outlet obstruction & urinary retention, myasthenia gravis on chronic steroid, dementia, copd/pulm fibrosis, presence of pacemaker, admitted 7/13 with severe sepsis found and hypxemic respiratory distress to have enterobacter bacteremia, RLE DVT/subsegmental PE started on anticoagulation this admission   7/13 bcx enterobacter cloacae (S cefepime, cipro; R cefazolin, bactrim). Unclear source of the enterobacter cloacae. Urine culture was negative. Has been on abx for several days. No fever here. Mild leukocytosis in setting of chronic steroid. Chronic foley not yet changed this admission.    Gram negative bsi low risk pacemaker involvement. Tte no obvious veg. Given his MG/morbidity, with prompt resolution of sepsis, would defer tee. If recurrent bacteremia reasonable to w/u for lead infection then   No issue with orif site. I doubt infection complication from enterobacter bsi   Myasthenia gravis on chronic steroid   Hypoxemic resp distress in setting sepsis, copd/pulm fibrosis, PE and perhaps worsening myasthenia gravis. No obvious sx of pna, but team treating as such. Reasonable. Of note, patient has been on moderate dose chronic steroid, so reasonble to w/u pjp if hypoxemia is worse (per team yes as of 7/18)  ---------- 7/20 assessment Unable to induce sputum for pjp cytology O2 requirement up as of 7/20 to 35L hfnc  90% fio2 Remains afebrile Started on bactrim pjp empiric tx 7/18 Fungitell pending still  Discussed with primary team to up prednisone dose to pjp tx dose    Plan:  F/u fungitel result Continue bactrim pjp tx dose Monitor daily renal and electrolyte level Increased pred to pjp tx dose as of 7/20 Stop cefepime after today 7/20 Rest of management per primary team Discussed with primary team  I spent more than 35 minute reviewing data/chart, and coordinating care and >50% direct face to face time providing counseling/discussing diagnostics/treatment plan with patient    Active Problems:   Pulmonary emboli (HCC)   Acute respiratory failure with hypoxia ([place])   Hypotension   Bacteremia   Severe sepsis without septic shock ([place])   Acute deep vein thrombosis (DVT) of proximal vein of right lower extremity (HCC)   Lobar pneumonia, unspecified organism (HCC)   Allergies  Allergen Reactions   Cefuroxime Axetil Other (See Comments)    Reaction not recalled   Cyclosporine Other (See Comments)    Reaction not recalled   Elemental Sulfur Other (See Comments)    No energy, could not walk    Maxzide [Triamterene-Hctz] Other (See Comments)    Adverse reaction- bloating and abdominal  pain   Sulfa Antibiotics Other (See Comments)    Could not sleep/eat and experienced gastric pain   Sulfamethoxazole Other (See Comments)    Could not sleep/eat and had gastric pain   Sulfamethoxazole-Trimethoprim Other (See Comments)    Gastric pain and could not eat/sleep   Prednisone Other (See Comments)    Can tolerate for a short period of time    Scheduled Meds:  albuterol  2.5 mg Nebulization TID   Chlorhexidine Gluconate Cloth  6 each Topical Daily   docusate sodium  100 mg Oral BID   feeding supplement  237 mL Oral BID BM   finasteride  5 mg Oral q morning   neomycin-bacitracin-polymyxin   Topical See admin instructions   pantoprazole  40 mg Oral Q supper   polyethylene glycol  17 g  Oral BID   potassium chloride  40 mEq Oral BID   predniSONE  20 mg Oral Q breakfast   [START ON 04/11/2021] predniSONE  20 mg Oral Q breakfast   [START ON 04/07/2021] predniSONE  40 mg Oral Q breakfast   pyridostigmine  60 mg Oral 4 times per day   Rivaroxaban  15 mg Oral BID WC   Followed by   Derrill Memo ON 04/20/2021] rivaroxaban  20 mg Oral Q supper   senna  1 tablet Oral Daily   tamsulosin  0.4 mg Oral Daily   Continuous Infusions:  ceFEPime (MAXIPIME) IV 2 g (04/06/21 0959)   sulfamethoxazole-trimethoprim 400 mg (04/06/21 0611)   PRN Meds:.acetaminophen, alum & mag hydroxide-simeth, bisacodyl, guaiFENesin-dextromethorphan, LORazepam, methocarbamol, ondansetron, oxyCODONE, risperiDONE   SUBJECTIVE: Afebrile O2 up to 35 liters No n/v/diarrhea No chest pain/sob No other complaint No rash  Review of Systems: ROS All other ROS was negative, except mentioned above     OBJECTIVE: Vitals:   04/06/21 0422 04/06/21 0748 04/06/21 0804 04/06/21 0814  BP: 116/63 111/69 (!) 87/53 (!) 87/53  Pulse: 99 99 98 94  Resp: 20 (!) 32 (!) 24 20  Temp: 97.9 F (36.6 C) 98.6 F (37 C)    TempSrc: Oral Oral    SpO2: 96% 95% 96% 97%  Weight:      Height:       Body mass index is 27.28 kg/m.  Physical Exam General/constitutional: no distress, pleasant; not yelling out today during exam HEENT: Normocephalic, PER, Conj Clear, EOMI, Oropharynx clear Neck supple CV: rrr no mrg Lungs: coarse breath sound bilaterally Abd: Soft, Nontender Ext: no edema Skin: No Rash Neuro: nonfocal MSK: no peripheral joint swelling/tenderness/warmth; back spines nontender     Lab Results Lab Results  Component Value Date   WBC 15.9 (H) 04/06/2021   HGB 9.8 (L) 04/06/2021   HCT 31.7 (L) 04/06/2021   MCV 99.1 04/06/2021   PLT 155 04/06/2021    Lab Results  Component Value Date   CREATININE 1.15 04/06/2021   BUN 29 (H) 04/06/2021   NA 130 (L) 04/06/2021   K 5.5 (H) 04/06/2021   CL 102  04/06/2021   CO2 16 (L) 04/06/2021    Lab Results  Component Value Date   ALT 15 03/19/2021   AST 20 04/10/2021   ALKPHOS 92 04/10/2021   BILITOT 1.3 (H) 03/29/2021      Microbiology: Recent Results (from the past 240 hour(s))  Culture, blood (Routine x 2)     Status: Abnormal   Collection Time: 03/27/2021 12:55 PM   Specimen: BLOOD  Result Value Ref Range Status   Specimen Description BLOOD RIGHT ANTECUBITAL  Final   Special Requests   Final    BOTTLES DRAWN AEROBIC AND ANAEROBIC Blood Culture results may not be optimal due to an excessive volume of blood received in culture bottles   Culture  Setup Time   Final    GRAM NEGATIVE RODS AEROBIC BOTTLE ONLY CRITICAL RESULT CALLED TO, READ BACK BY AND VERIFIED WITHAndres Shad [facility] 1610 03/31/21 A BROWNING Performed at [facility], [place] [address] [place], [place] [address]    Culture ENTEROBACTER CLOACAE (A)  Final   Report Status 04/02/2021 FINAL  Final   Organism ID, Bacteria ENTEROBACTER CLOACAE  Final      Susceptibility   Enterobacter cloacae - MIC*    CEFAZOLIN >=64 RESISTANT Resistant     CEFEPIME <=0.12 SENSITIVE Sensitive     CEFTAZIDIME <=1 SENSITIVE Sensitive     CIPROFLOXACIN <=0.25 SENSITIVE Sensitive     GENTAMICIN <=1 SENSITIVE Sensitive     IMIPENEM 1 SENSITIVE Sensitive     TRIMETH/SULFA >=320 RESISTANT Resistant     PIP/TAZO <=4 SENSITIVE Sensitive     * ENTEROBACTER CLOACAE  Blood Culture ID Panel (Reflexed)     Status: Abnormal   Collection Time: 04/03/2021 12:55 PM  Result Value Ref Range Status   Enterococcus faecalis NOT DETECTED NOT DETECTED Final   Enterococcus Faecium NOT DETECTED NOT DETECTED Final   Listeria monocytogenes NOT DETECTED NOT DETECTED Final   Staphylococcus species NOT DETECTED NOT DETECTED Final   Staphylococcus aureus (BCID) NOT DETECTED NOT DETECTED Final   Staphylococcus epidermidis NOT DETECTED NOT DETECTED Final   Staphylococcus lugdunensis NOT DETECTED NOT DETECTED Final    Streptococcus species NOT DETECTED NOT DETECTED Final   Streptococcus agalactiae NOT DETECTED NOT DETECTED Final   Streptococcus pneumoniae NOT DETECTED NOT DETECTED Final   Streptococcus pyogenes NOT DETECTED NOT DETECTED Final   A.calcoaceticus-baumannii NOT DETECTED NOT DETECTED Final   Bacteroides fragilis NOT DETECTED NOT DETECTED Final   Enterobacterales DETECTED (A) NOT DETECTED Final    Comment: Enterobacterales represent a large order of gram negative bacteria, not a single organism.   Enterobacter cloacae complex DETECTED (A) NOT DETECTED Final    Comment: CRITICAL RESULT CALLED TO, READ BACK BY AND VERIFIED WITH: Andres Shad PHARMD 1327 03/31/21 A BROWNING    Escherichia coli NOT DETECTED NOT DETECTED Final   Klebsiella aerogenes NOT DETECTED NOT DETECTED Final   Klebsiella oxytoca NOT DETECTED NOT DETECTED Final   Klebsiella pneumoniae NOT DETECTED NOT DETECTED Final   Proteus species NOT DETECTED NOT DETECTED Final   Salmonella species NOT DETECTED NOT DETECTED Final   Serratia marcescens NOT DETECTED NOT DETECTED Final   Haemophilus influenzae NOT DETECTED NOT DETECTED Final   Neisseria meningitidis NOT DETECTED NOT DETECTED Final   Pseudomonas aeruginosa NOT DETECTED NOT DETECTED Final   Stenotrophomonas maltophilia NOT DETECTED NOT DETECTED Final   Candida albicans NOT DETECTED NOT DETECTED Final   Candida auris NOT DETECTED NOT DETECTED Final   Candida glabrata NOT DETECTED NOT DETECTED Final   Candida krusei NOT DETECTED NOT DETECTED Final   Candida parapsilosis NOT DETECTED NOT DETECTED Final   Candida tropicalis NOT DETECTED NOT DETECTED Final   Cryptococcus neoformans/gattii NOT DETECTED NOT DETECTED Final   CTX-M ESBL NOT DETECTED NOT DETECTED Final   Carbapenem resistance IMP NOT DETECTED NOT DETECTED Final   Carbapenem resistance KPC NOT DETECTED NOT DETECTED Final   Carbapenem resistance NDM NOT DETECTED NOT DETECTED Final   Carbapenem resist OXA 48 LIKE NOT  DETECTED NOT DETECTED Final   Carbapenem resistance VIM NOT DETECTED NOT DETECTED Final    Comment: Performed at [facility], [place] [address], [place], [place] [address]  Resp Panel by RT-PCR (Flu A&B, Covid) Nasopharyngeal Swab     Status: None   Collection Time: 03/19/2021  1:00 PM   Specimen: Nasopharyngeal Swab; Nasopharyngeal(NP) swabs in vial transport medium  Result Value Ref Range Status   SARS Coronavirus 2 by RT PCR NEGATIVE NEGATIVE Final    Comment: (NOTE) SARS-CoV-2 target nucleic acids are NOT DETECTED.  The SARS-CoV-2 RNA is generally detectable in upper respiratory specimens during the acute phase of infection. The lowest concentration of SARS-CoV-2 viral copies this assay can detect is 138 copies/mL. A negative result does not preclude SARS-Cov-2 infection and should not be used as the sole basis for treatment or other patient management decisions. A negative result may occur with  improper specimen collection/handling, submission of specimen other than nasopharyngeal swab, presence of viral mutation(s) within the areas targeted by this assay, and inadequate number of viral copies(<138 copies/mL). A negative result must be combined with clinical observations, patient history, and epidemiological information. The expected result is Negative.  Fact Sheet for Patients:  EntrepreneurPulse.com.au  Fact Sheet for Healthcare Providers:  IncredibleEmployment.be  This test is no t yet approved or cleared by the [place] FDA and  has been authorized for detection and/or diagnosis of SARS-CoV-2 by FDA under an Emergency Use Authorization (EUA). This EUA will remain  in effect (meaning this test can be used) for the duration of the COVID-19 declaration under Section 564(b)(1) of the Act, 21 U.S.C.section 360bbb-3(b)(1), unless the authorization is terminated  or revoked sooner.       Influenza A by PCR NEGATIVE NEGATIVE Final    Influenza B by PCR NEGATIVE NEGATIVE Final    Comment: (NOTE) The Xpert Xpress SARS-CoV-2/FLU/RSV plus assay is intended as an aid in the diagnosis of influenza from Nasopharyngeal swab specimens and should not be used as a sole basis for treatment. Nasal washings and aspirates are unacceptable for Xpert Xpress SARS-CoV-2/FLU/RSV testing.  Fact Sheet for Patients: EntrepreneurPulse.com.au  Fact Sheet for Healthcare Providers: IncredibleEmployment.be  This test is not yet approved or cleared by the [place] FDA and has been authorized for detection and/or diagnosis of SARS-CoV-2 by FDA under an Emergency Use Authorization (EUA). This EUA will remain in effect (meaning this test can be used) for the duration of the COVID-19 declaration under Section 564(b)(1) of the Act, 21 U.S.C. section 360bbb-3(b)(1), unless the authorization is terminated or revoked.  Performed at [facility], [place] [address], [place], [place] [address]   Surgical pcr screen     Status: None   Collection Time: 04/14/2021  5:23 PM   Specimen: Nasal Mucosa; Nasal Swab  Result Value Ref Range Status   MRSA, PCR NEGATIVE NEGATIVE Final   Staphylococcus aureus NEGATIVE NEGATIVE Final    Comment: (NOTE) The Xpert SA Assay (FDA approved for NASAL specimens in patients 52 years of age and older), is one component of a comprehensive surveillance program. It is not intended to diagnose infection nor to guide or monitor treatment. Performed at [facility], [place] [address], [place], [place] [address]   Culture, blood (Routine x 2)     Status: None   Collection Time: 03/31/2021 10:53 PM   Specimen: BLOOD  Result Value Ref Range Status   Specimen Description BLOOD LEFT ANTECUBITAL  Final   Special  Requests   Final    BOTTLES DRAWN AEROBIC ONLY Blood Culture adequate volume   Culture   Final    NO GROWTH 5 DAYS Performed at [facility], [place] [address]  [place], [place] [address]    Report Status 04/04/2021 FINAL  Final  Urine Culture     Status: None   Collection Time: 03/31/21  6:33 AM   Specimen: Urine, Random  Result Value Ref Range Status   Specimen Description URINE, RANDOM  Final   Special Requests NONE  Final   Culture   Final    NO GROWTH Performed at [facility], [place] [address], [place], [place] [address]    Report Status 04/02/2021 FINAL  Final  Culture, blood (routine x 2)     Status: None (Preliminary result)   Collection Time: 04/02/21  2:12 PM   Specimen: BLOOD  Result Value Ref Range Status   Specimen Description BLOOD BLOOD RIGHT HAND  Final   Special Requests   Final    AEROBIC BOTTLE ONLY Blood Culture results may not be optimal due to an inadequate volume of blood received in culture bottles   Culture   Final    NO GROWTH 4 DAYS Performed at [facility], [place] [address], [place], [place] [address]    Report Status PENDING  Incomplete  Culture, blood (routine x 2)     Status: None (Preliminary result)   Collection Time: 04/02/21  2:21 PM   Specimen: BLOOD  Result Value Ref Range Status   Specimen Description BLOOD BLOOD RIGHT HAND  Final   Special Requests   Final    AEROBIC BOTTLE ONLY Blood Culture results may not be optimal due to an inadequate volume of blood received in culture bottles   Culture   Final    NO GROWTH 4 DAYS Performed at [facility], [place] [address], [place], [place] [address]    Report Status PENDING  Incomplete  Resp Panel by RT-PCR (Flu A&B, Covid) Nasopharyngeal Swab     Status: None   Collection Time: 04/05/21  6:34 PM   Specimen: Nasopharyngeal Swab; Nasopharyngeal(NP) swabs in vial transport medium  Result Value Ref Range Status   SARS Coronavirus 2 by RT PCR NEGATIVE NEGATIVE Final    Comment: (NOTE) SARS-CoV-2 target nucleic acids are NOT DETECTED.  The SARS-CoV-2 RNA is generally detectable in upper respiratory specimens during the acute phase of infection.  The lowest concentration of SARS-CoV-2 viral copies this assay can detect is 138 copies/mL. A negative result does not preclude SARS-Cov-2 infection and should not be used as the sole basis for treatment or other patient management decisions. A negative result may occur with  improper specimen collection/handling, submission of specimen other than nasopharyngeal swab, presence of viral mutation(s) within the areas targeted by this assay, and inadequate number of viral copies(<138 copies/mL). A negative result must be combined with clinical observations, patient history, and epidemiological information. The expected result is Negative.  Fact Sheet for Patients:  EntrepreneurPulse.com.au  Fact Sheet for Healthcare Providers:  IncredibleEmployment.be  This test is no t yet approved or cleared by the [place] FDA and  has been authorized for detection and/or diagnosis of SARS-CoV-2 by FDA under an Emergency Use Authorization (EUA). This EUA will remain  in effect (meaning this test can be used) for the duration of the COVID-19 declaration under Section 564(b)(1) of the Act, 21 U.S.C.section 360bbb-3(b)(1), unless the authorization is terminated  or  revoked sooner.       Influenza A by PCR NEGATIVE NEGATIVE Final   Influenza B by PCR NEGATIVE NEGATIVE Final    Comment: (NOTE) The Xpert Xpress SARS-CoV-2/FLU/RSV plus assay is intended as an aid in the diagnosis of influenza from Nasopharyngeal swab specimens and should not be used as a sole basis for treatment. Nasal washings and aspirates are unacceptable for Xpert Xpress SARS-CoV-2/FLU/RSV testing.  Fact Sheet for Patients: EntrepreneurPulse.com.au  Fact Sheet for Healthcare Providers: IncredibleEmployment.be  This test is not yet approved or cleared by the [place] FDA and has been authorized for detection and/or diagnosis of SARS-CoV-2 by FDA  under an Emergency Use Authorization (EUA). This EUA will remain in effect (meaning this test can be used) for the duration of the COVID-19 declaration under Section 564(b)(1) of the Act, 21 U.S.C. section 360bbb-3(b)(1), unless the authorization is terminated or revoked.  Performed at [facility], [place] [address], [place], [place] [address]      Serology:   Imaging: If present, new imagings (plain films, ct scans, and mri) have been personally visualized and interpreted; radiology reports have been reviewed. Decision making incorporated into the Impression / Recommendations.  7/14 tte  1. Left ventricular ejection fraction, by estimation, is 55 to 60%. The  left ventricle has normal function. The left ventricle has no regional  wall motion abnormalities. Left ventricular diastolic parameters are  consistent with Grade II diastolic  dysfunction (pseudonormalization).   2. Peak RV-RA gradient 60.5 mmHg. The IVC was poorly visualized. Right  ventricular systolic function is mildly reduced. The right ventricular  size is mildly enlarged.   3. Left atrial size was mildly dilated.   4. The mitral valve is normal in structure. Moderate mitral valve  regurgitation. No evidence of mitral stenosis.   5. The aortic valve is tricuspid. Aortic valve regurgitation is mild.  Mild aortic valve sclerosis is present, with no evidence of aortic valve  stenosis.   7/13 cta chest 1. Occlusive pulmonary embolus involving a segmental branch of the left lower lobe. 2. Chronic interstitial lung disease. 3.  Aortic Atherosclerosis (ICD10-I70.0). 4. Multi vessel coronary artery atherosclerosis.  Jabier Mutton, [place] [place] [phone] pager    04/06/2021, 10:50 AM

## 2021-04-06 NOTE — Consult Note (Signed)
Consultation Note Date: 04/06/2021   Patient Name: [name]  DOB: 1931/03/02  MRN: [id number]  Age / Sex: 87 y.o., male  PCP: Shirline Frees, MD Referring Physician: Kathie Dike, MD  Reason for Consultation: Establishing goals of care  HPI/Patient Profile: [name] is a 87 y.o. male with a past medical history significant for dementia, HTN, sick sinus syndrome with dual chamber pacer, solitary kidney, dyslipidemia, myasthenia gravis on chronic steroids, CAD, BPH with chronic foley, and ORIF of right intertrochanteric femur fracture on 03/19/21 with discharge on 03/23/21 to [facility]. A few days post discharge pt developed right leg swelling and pain. [place] on 03/29/21 found right leg DVT and pt was started on Xarelto. Pt presented to ED on 04/06/2021 in respiratory distress and was hypotensive.   During this hospital course: -Enterobacter bacteremia and abx given -Blood cultures negative 7/20 -Chest X ray suspect for pneumonia vs. pulmonary edema -Oxygen requirements continues to increase  Patient and family face treatment option decisions, advanced directive decisions and anticipatory care needs   Clinical Assessment and Goals of Care:   NP Jordan Hawks and I reviewed medical records, received report from team, assessed the patient and then met at the [facility] consult room with patient's daughter Shirlean Mylar to discuss diagnosis, prognosis, GOC, EOL wishes disposition and options.  The patient has dementia and lacks ability to participate in [place] discussion. Patient's daughter Wonda Cheng endorses there is a HCPOA that names both Shirlean Mylar and other daughter Mardene Celeste as Garment/textile technologist. She states she will bring in a copy for our records tomorrow.    Concept of Palliative Care was introduced as specialized medical care for people and their families living with serious illness.  If focuses on providing relief  from the symptoms and stress of a serious illness.  The goal is to improve quality of life for both the patient and the family.  I created space and opportunity for patient's daughter to explore thoughts and feelings regarding patient's current medical situation.  Daughter verbalizes an understanding of the seriousness of her father's current medical situation, the likely poor prognosis and is high risk for decompensation.   Education offered today regarding advanced directives. Concepts specific to code status, artifical feeding and hydration, continued IV antibiotics and rehospitalization was had.   I educated the daughter on the difference between an aggressive medical intervention path and a palliative comfort care path. She expressed that her father would not want extreme, aggressive measures and that a DNR is appropriate for him at this time. We discussed the natural trajectory of her father's multiple medical issues.  I introduced the MOST form. She is not ready to complete the MOST form in its entirety today but I gave her a blank form to review at her leisure.   I also provided a copy of Hard Choices for [employer].   All questions and concerns addressed to best of my ability. Emotional support was provided. A family meeting with both daughters in planned for tomorrow, 04/07/21, at 12:30pm.  Primary Decision Maker NEXT OF KIN    SUMMARY OF RECOMMENDATIONS   -DNR- I documented today  -MOST form reviewed -Hard Choices for [employer] given -Family Meeting scheduled for tomorrow, 04/07/21, at 12:30pm  Code Status/Advance Care Planning: DNR  Symptom Management:  Pain control Respiratory support Minimize agitation  Palliative Prophylaxis:  Frequent Pain Assessment  Psycho-social/Spiritual:  Desire for further Chaplaincy support:no  Prognosis:   Unable to determine  Discharge Planning: To Be Determined  Primary Diagnoses: Present on Admission: **None**   I have  reviewed the medical record, interviewed the patient and family, and examined the patient. The following aspects are pertinent.  Past Medical History:  Diagnosis Date   Carotid artery disease ([place])    Coronary artery disease    PCI RCA 2009   Dyslipidemia    Hypertension    Scleritis, unspecified    treated with prednisone   Seizure ([place])    felt due to cyclosporin in setting of electrolyte disorder   Solitary kidney    Symptomatic bradycardia    a. s/p STJ dual chamber pacemaker   Social History   Socioeconomic History   Marital status: Widowed    Spouse name: Not on file   Number of children: 2   Years of education: college   Highest education level: Not on file  Occupational History   Occupation: retired  Tobacco Use   Smoking status: Never   Smokeless tobacco: Never  Vaping Use   Vaping Use: Never used  Substance and Sexual Activity   Alcohol use: Yes    Comment: rarely   Drug use: No   Sexual activity: Not on file  Other Topics Concern   Not on file  Social History Narrative   Married   Tobacco   etoh   Right Handed   Lives in a one story home   Drinks half a cup of coffee a day    Social Determinants of Health   Financial Resource Strain: Not on file  Food Insecurity: Not on file  Transportation Needs: Not on file  Physical Activity: Not on file  Stress: Not on file  Social Connections: Not on file   Scheduled Meds:  acetaminophen  1,000 mg Oral Q8H   albuterol  2.5 mg Nebulization TID   Chlorhexidine Gluconate Cloth  6 each Topical Daily   docusate sodium  100 mg Oral BID   feeding supplement  237 mL Oral BID BM   finasteride  5 mg Oral q morning   neomycin-bacitracin-polymyxin   Topical See admin instructions   pantoprazole  40 mg Oral Q supper   polyethylene glycol  17 g Oral BID   [START ON 04/11/2021] predniSONE  20 mg Oral Q breakfast   [START ON 04/07/2021] predniSONE  40 mg Oral Q breakfast   pyridostigmine  60 mg Oral 4 times per day    Rivaroxaban  15 mg Oral BID WC   Followed by   Derrill Memo ON 04/20/2021] rivaroxaban  20 mg Oral Q supper   senna  1 tablet Oral Daily   tamsulosin  0.4 mg Oral Daily   Continuous Infusions:  ceFEPime (MAXIPIME) IV 2 g (04/06/21 0959)   sulfamethoxazole-trimethoprim     PRN Meds:.alum & mag hydroxide-simeth, bisacodyl, guaiFENesin-dextromethorphan, LORazepam, methocarbamol, ondansetron, oxyCODONE, risperiDONE Medications Prior to Admission:  Prior to Admission medications   Medication Sig Start Date End Date Taking? Authorizing Provider  acetaminophen (TYLENOL) 325 MG tablet Take 650 mg by mouth every 6 (six) hours as  needed for mild pain or fever.   Yes [provider]  amLODipine (NORVASC) 2.5 MG tablet Take 2.5 mg by mouth every morning.   Yes [provider]  carvedilol (COREG) 3.125 MG tablet Take 1 tablet (3.125 mg total) by mouth 2 (two) times daily. 03/23/21  Yes Shelly Coss, MD  Dextromethorphan-guaiFENesin 10-100 MG/5ML liquid Take 10 mLs by mouth every 4 (four) hours as needed (cough/congestion).   Yes [provider]  docusate sodium (COLACE) 100 MG capsule Take 1 capsule (100 mg total) by mouth 2 (two) times daily. 03/23/21  Yes Shelly Coss, MD  finasteride (PROSCAR) 5 MG tablet Take 5 mg by mouth every morning.   Yes [provider]  LORazepam (ATIVAN) 0.5 MG tablet Take 1 tablet (0.5 mg total) by mouth at bedtime as needed (insomnia). 03/22/21  Yes Shelly Coss, MD  methocarbamol (ROBAXIN) 500 MG tablet Take 1 tablet (500 mg total) by mouth every 6 (six) hours as needed for muscle spasms. 03/23/21  Yes Shelly Coss, MD  Neomycin-Bacitracin-Polymyxin (TRIPLE ANTIBIOTIC) OINT Apply 1 application topically See admin instructions. Apply small amount of triple antibiotic ointment to non-stick pad and cover wound, wrap with coban until healed   Yes [provider]  NITROSTAT 0.4 MG SL tablet DISSOLVE ONE TABLET UNDER THE TONGUE EVERY 5  MINUTES AS NEEDED FOR CHEST PAIN.  DO NOT EXCEED A TOTAL OF 3 DOSES IN 15 MINUTES Patient taking differently: Place 0.4 mg under the tongue every 5 (five) minutes x 3 doses as needed for chest pain. 11/19/18  Yes Fay Records, MD  ondansetron (ZOFRAN) 4 MG tablet Take 4 mg by mouth every 8 (eight) hours as needed for nausea or vomiting.   Yes [provider]  oxyCODONE (OXY IR/ROXICODONE) 5 MG immediate release tablet Take 1 tablet (5 mg total) by mouth every 6 (six) hours as needed for severe pain. 03/22/21  Yes Shelly Coss, MD  predniSONE (DELTASONE) 20 MG tablet Take 1 tablet (20 mg total) by mouth daily with breakfast. 08/16/20  Yes Patel, Donika K, DO  pyridostigmine (MESTINON) 60 MG tablet Take 1 tablet at 7am, 10am 1pm, and 5pm Patient taking differently: Take 60 mg by mouth See admin instructions. Take one tablet (60 mg) by mouth four times daily - 6:30am, 10am, 1pm and 5pm 07/16/20  Yes Patel, Donika K, DO  risperiDONE (RISPERDAL) 0.5 MG tablet Take 1 tablet (0.5 mg total) by mouth at bedtime as needed (agitation). 03/23/21  Yes Shelly Coss, MD  tamsulosin (FLOMAX) 0.4 MG CAPS capsule Take 1 capsule (0.4 mg total) by mouth daily. 03/23/21  Yes Adhikari, Tamsen Meek, MD  XARELTO 15 MG TABS tablet Take 15 mg by mouth 2 (two) times daily. 03/29/21 04/19/21 Yes [provider]  Ensure (ENSURE) Take 237 mLs by mouth daily as needed (meal refusal).    [provider]  Nutritional Supplements (NUTRITIONAL SUPPLEMENT PO) Take 1 Can by mouth 2 (two) times daily. Muscle Milk (provided by family)    [provider]   Allergies  Allergen Reactions   Cefuroxime Axetil Other (See Comments)    Reaction not recalled   Cyclosporine Other (See Comments)    Reaction not recalled   Elemental Sulfur Other (See Comments)    No energy, could not walk    Maxzide [Triamterene-Hctz] Other (See Comments)    Adverse reaction- bloating and abdominal pain   Sulfa Antibiotics Other (See  Comments)    Could not sleep/eat and experienced gastric pain  Sulfamethoxazole Other (See Comments)    Could not sleep/eat and had gastric pain   Sulfamethoxazole-Trimethoprim Other (See Comments)    Gastric pain and could not eat/sleep   Prednisone Other (See Comments)    Can tolerate for a short period of time   Review of Systems  Unable to perform ROS  Physical Exam Constitutional:      General: He is not in acute distress. HENT:     Head: Normocephalic.  Pulmonary:     Effort: No respiratory distress.  Skin:    General: Skin is warm and dry.  Psychiatric:     Comments: Thought content is non-linear or sensical    Vital Signs: BP (!) 99/59   Pulse 88   Temp 98.6 F (37 C) (Oral)   Resp (!) 22   Ht _0  (1.702 m)   Wt 79 kg   SpO2 92%   BMI 27.28 kg/m  Pain Scale: PAINAD POSS *See Group Information*: 1-Acceptable,Awake and alert Pain Score: 0-No pain   SpO2: SpO2: 92 % O2 Device:SpO2: 92 % O2 Flow Rate: .O2 Flow Rate (L/min): 35 L/min  IO: Intake/output summary:  Intake/Output Summary (Last 24 hours) at 04/06/2021 1457 Last data filed at 04/06/2021 0749 Gross per 24 hour  Intake --  Output 600 ml  Net -600 ml    LBM: Last BM Date: 04/05/21 Baseline Weight: Weight: 73.5 kg Most recent weight: Weight: 79 kg     Palliative Assessment/Data: 30 % at best     I discussed this patient's plan of care with DR Roderic Palau and bedside RN .  Thank you for this consult. Palliative medicine will continue to follow and assist as needed.  Time in: 1300 Time out: 1415 Time Total: 75  Greater than 50%  of this time was spent counseling and coordinating care related to the above assessment and plan.  Signed by: Jordan Hawks, DNP, FNP-BC Palliative Medicine    Please contact Palliative Medicine Team phone at [phone] for questions and concerns.  For individual provider: See Shea Evans

## 2021-04-06 NOTE — Progress Notes (Signed)
NAME:  [name], MRN:  [id number], DOB:  [birth year], LOS: 7 ADMISSION DATE:  03/19/2021, CONSULTATION DATE:  7/14 REFERRING MD:  Dr. Tyrell Antonio, CHIEF COMPLAINT:  hypotension; PE   History of Present Illness:  87 yo M w/ PMH of R hip fracture post ORIF 1 week ago, chronic foley catheter, myasthenia gravis on chronic steroid, HTN, BPH, dementia presents to [facility] on 7/13 with hypotension.  Patient was discharged from [facility] 1 week ago after ORIF of right hip. Discharged on aspirin and plavix. Patient developed right leg swelling and pain around 7/9. On 7/12, DVT ultrasound positive for fight leg DVT. Started on Xarelto.  On 7/14, patient was hypotensive and in respiratory distress at his rehab facility and was admitted to [facility]. Denies cough, fever, chills.  ED course: BP borderline low, hypoxia resolved on 2 l/m, CTA positive for LLL subsegmental PE. WBC 18, Na 126.  PCCM consulted on 7/14 to evaluate management for hypotension and hypoxemia.  Reconsulted on 7/17 for transient hypotension and increased work of breathing on 6 L/min.  Has been on anticoagulation, heparin was changed to Xarelto 7/16.  Found to have Enterobacter bacteremia and treated with doxycycline plus cefepime, has been on stress dose steroids with hydrocortisone given his chronic prednisone use.  Follow-up blood cultures 7/16 still negative.  CXR possible pneumonia versus pulmonary edema.  Oxygen requirement increased to 6 L/min on 7/16, has received diuretics.  I/O- 3.9 L total.   Pertinent  Medical History   Past Medical History:  Diagnosis Date   Carotid artery disease ([place])    Coronary artery disease    PCI RCA 2009   Dyslipidemia    Hypertension    Scleritis, unspecified    treated with prednisone   Seizure ([place])    felt due to cyclosporin in setting of electrolyte disorder   Solitary kidney    Symptomatic bradycardia    a. s/p STJ dual chamber pacemaker    Significant Hospital Events: Including procedures,  antibiotic start and stop dates in addition to other pertinent events   7/13: patient admitted to [facility] for hypotension and PE 7/14: PCCM consulted for hypotension. Responding to IV fluids.  7/17 PCCM consult for transient hypotension, increased work of breathing on 6 L/min 7/18 Some mild hypotension this AM (concern for compliance during BP check as BP appropriate when patient was reminded to remain still during check) 7/20 now on 30 l/m  hfnc cxr ordered  Interim History / Subjective:  Now ow 30 l/m hfnc.  Objective   Blood pressure (!) 87/53, pulse 94, temperature 98.6 F (37 C), temperature source Oral, resp. rate 20, height 5\' 7"  (1.702 m), weight 79 kg, SpO2 97 %.    FiO2 (%):  [90 %-100 %] 90 %   Intake/Output Summary (Last 24 hours) at 04/06/2021 0826 Last data filed at 04/06/2021 0749 Gross per 24 hour  Intake --  Output 1025 ml  Net -1025 ml   Filed Weights   03/18/2021 1229 03/31/2021 2204  Weight: 73.5 kg 79 kg    Examination: General: Frail 87 year old male who is not in acute distress at this time. HEENT: MM pink/moist Neuro: Follows commands interacts appropriately but is confused CV: Heart sounds are regular regular rate rhythm PULM: Diminished breath sounds throughout currently on 30 L high-flow nasal cannula  GI: soft, bsx4 active  GU: Indwelling Foley Extremities: warm/dry, good edema  Skin: no rashes or lesions   Resolved Hospital Problem list     Assessment &  Plan:   Sepsis due to Enterobacter bacteremia and possible pneumonia Enterobacter bacteremia Bilateral pulmonary infiltrates, question pneumonia -Remains hemodynamically stable. Initially had some low BP readings with diuresis + sepsis.  P: Continue antibiotics per infectious disease  Acute PE / DVT  -CTA 7/13 occlusive PE LLL with no signs of heart strain; Echo reassuring. Started on Xarelto 3 days prior to admission P: Currently on Xarelto No evidence of bleeding  Acute hypoxic resp  failure: Multifactorial including pulmonary embolism, bilateral pulmonary infiltrates superimposed on interstitial disease.   Consider component of restrictive physiology due to respiratory muscle weakness given his myasthenia gravis, overall debilitated state due to ORIF, hospitalization.   -CT chest 7/13: shows chronic interstitial lung disease.  Subsequent chest x-ray with evolving bibasilar infiltrates superimposed on his ILD.  Consider pneumonia as above, aspiration, consider atelectasis 04/06/2021 now on 30 L high flow nasal cannula CT scan revealed bullae P: Goal to wean FiO2 but unable at this time Continue antimicrobial Continue chronic steroids for myasthenia gravis Reconsult palliative care 04/06/2021 Poor prognosis  MG, chronic steroid use, hyponatremia, recent ORIF P: Continue prednisone and mestinon  Best Practice   Diet/type: Regular consistency (see orders) DVT prophylaxis: DOAC GI prophylaxis: N/A Lines: N/A Foley:  Yes, and it is still needed Code Status:  full code Last date of multidisciplinary goals of care discussion - per primary, consider palliative discussion given overall illness burden and acute needs.  04/06/2021 we will reconsult palliative care. Have paged np  Critical care time: n/a  Richardson Landry Jakob Kimberlin ACNP Acute Care Nurse Practitioner [place] Please consult Amion 04/06/2021, 8:26 AM

## 2021-04-06 NOTE — Progress Notes (Signed)
PROGRESS NOTE    [name]  DPO:[id number] DOB: 12-19-1930 DOA: 04/13/2021 PCP: Shirline Frees, MD   Brief Narrative: 87 year old with past medical history significant for right hip fracture status post ORIF with intramedullary nailing, urinary retention status post Foley catheter, myasthenia gravis on chronic Steroids, hypertension, BPH, dementia, essential tremor presents with hypotension.  Patient was recently discharged from the hospital 1 week ago after ORIF of the right hip.  Patient over the last 2 or 3 days developed right leg swelling and pain.  Doppler lower extremity was positive for DVT.  Patient was a started on Xarelto overnight at SNF>   The morning of admission patient was found to be in respiratory distress and an low blood pressure.  Patient was referred to the ED for further evaluation.  Patient was noted to have a rash on his left elbow.  Evaluation in the ED patient was noted to have borderline low blood pressure, hypoxia.  CTA positive for left lower lobe subsegmental PE.  Patient admitted with PE, DVT, acute hypoxic respiratory failure multifactorial secondary to pulmonary edema, PNA, ILD. He was found to have Enterobacter Cloaceae bacteremia, sepsis, and delirium.   He continue to required HF oxygen 15 L. He was started On IV bactrim to treat for PJP on 7/18. He was on stress dose steroids, transition to prednisone chronic home dose 7/19. He continue to be confuse. CCM is following.   Assessment & Plan:   Active Problems:   Pulmonary emboli (HCC)   Acute respiratory failure with hypoxia (HCC)   Hypotension   Bacteremia   Severe sepsis without septic shock (HCC)   Acute deep vein thrombosis (DVT) of proximal vein of right lower extremity (HCC)   Lobar pneumonia, unspecified organism ([place])   Immunosuppressed status ([place])   1-Subsegmental left lower lobe PE, DVT,  Acute Hypoxic Respiratory Failure.  Acute pulmonary edema secondary to acute diastolic HF  exacerbation:  PNA -Patient was treated initially on  heparin drip.  -He was  transition to [place] 7/16. -Chest X-ray 7/15: Progressive interstitial and ground glass opacities consistent with worsening edema or infection.  -Started on IV lasix 7/15 to help with pulmonary edema.  -Increase oxygen requirement on 7/17 up to 6 L. Repeated chest x ray Showed: similar appearance favored to reflect severe multilobar PNA> \-Pulmonologist consulted.  -Oxygen requirement increase to  35 L oxygen on 7/20, worsening hypoxemia.  -ID consulted: checking Fungitell pending. and Pneumocystis smear.  -Started on IV bactrim to cover for PJP 7/18. -repeat chest xray with possible edema, received a dose of lasix -unclear if MG playing a role, will check NIF/FVC  2-Anemia;  Hb down to 7.6. No evidence of active bleeding.  Received one unit PRBC.  Prior HD; 7.9 July 5.  Hb stable. 9.8 Stable.   3-Sepsis, 1/2 Blood culture positive for Enterobacter Cloaceae.  Presents with leukocytosis, hypotension, left elbow cellulitis.  On IV steroid.  Started on Cefepime 7/14. Doxy DC because Bactim was started on 7/18 Repeated Blood culture 7/16: no growth to date.  He will complete his course of cefepime on 7/20 Continued on IV Bactrim until PJP can be ruled out Blood pressures running low today Suspect hypoalbuminemia contributing Will give a trial of albumin   Abdominal pain;  On IV protonix.  KUB: mild ileus, Moderate stool in the colon including moderate retained feces at the rectum. Had BM 7/14, 7/15. Continue with miralax, senna, PRN supp.  Resolved after BM  Chronic steroid use;  On  chronic prednisone.  Received  IV hydrocortisone.  Currently on prednisone dosing to treat possible PJP  Myasthenia Gravis -chronically on prednisone and mestinon -unclear if this is contributing to resp failure -check NIF/FVC q6h   Hyponatremia:  Received IV fluids.  Monitor.   Recent ORIF, Right Hip:  Ortho  : Dr Victorino December.  Evaluated by Ortho continue with staples and dry dressing PRN  Chronic Indwelling Foley catheter:  Needs to follow up with urology.  exchange foley   Delirium;  Risperidone to BID PRN agitation.   Patient with multiples medical problems, Acute hypoxic respiratory failure, ILD, PNA, PE.  Palliative care consulted for goals of care.   Goal  of care;  Palliative care following for goals of care.  Patiently has been made DNR.  Family meeting tomorrow to discuss further goals with palliative.   Estimated body mass index is 27.28 kg/m as calculated from the following:   Height as of this encounter: 5\' 7"  (1.702 m).   Weight as of this encounter: 79 kg.   DVT prophylaxis: Xarelto Code Status: DNR Family Communication: Daughter Shirlean Mylar updated 7/20 Disposition Plan:  Status is: Inpatient  Remains inpatient appropriate because:Hemodynamically unstable  Dispo: The patient is from: SNF              Anticipated d/c is to: SNF              Patient currently is not medically stable to d/c.   Difficult to place patient No        Consultants:  Pulmonology Infectious disease Palliative care  Procedures:  ECHO : EF 40 to 60% with grade 3 diastolic dysfunction, right ventricular systolic function was mildly reduced  Antimicrobials:    Subjective: He has required increasing oxygen requirements overnight, currently on 35 L.  Reports having a cough that is nonproductive   Objective: Vitals:   04/06/21 0804 04/06/21 0814 04/06/21 1410 04/06/21 1600  BP: (!) 87/53 (!) 87/53 (!) 99/59   Pulse: 98 94 88   Resp: (!) 24 20 (!) 22   Temp:   97.7 F (36.5 C) 97.7 F (36.5 C)  TempSrc:   Oral   SpO2: 96% 97% 92%   Weight:      Height:        Intake/Output Summary (Last 24 hours) at 04/06/2021 1641 Last data filed at 04/06/2021 1600 Gross per 24 hour  Intake --  Output 1400 ml  Net -1400 ml   Filed Weights   03/26/2021 1229 03/23/2021 2204  Weight: 73.5 kg  79 kg    Examination:  General exam: Alert, awake, currently in mitts Respiratory system: crackles at bases. Respiratory effort normal. Cardiovascular system:RRR. No murmurs, rubs, gallops. Gastrointestinal system: Abdomen is nondistended, soft and nontender. No organomegaly or masses felt. Normal bowel sounds heard. Central nervous system: No focal neurological deficits. Extremities: 1+ edema bilaterally Skin: No rashes, lesions or ulcers Psychiatry: confused     Data Reviewed: I have personally reviewed following labs and imaging studies  CBC: Recent Labs  Lab 04/02/21 0034 04/03/21 0032 04/04/21 0020 04/05/21 0102 04/06/21 0618  WBC 12.4* 14.9* 14.9* 11.4* 15.9*  HGB 8.9* 9.5* 9.9* 9.3* 9.8*  HCT 28.0* 30.0* 31.8* 30.5* 31.7*  MCV 95.9 96.2 98.1 100.0 99.1  PLT 201 207 226 208 242   Basic Metabolic Panel: Recent Labs  Lab 04/02/21 0034 04/03/21 0032 04/04/21 0020 04/05/21 0102 04/06/21 0618  NA 134* 135 136 134* 130*  K 3.3* 3.5 3.7 4.4 5.5*  CL 103 101 101 104 102  CO2 23 24 25 23  16*  GLUCOSE 144* 135* 122* 88 93  BUN 16 18 25* 29* 29*  CREATININE 0.91 0.90 1.00 0.93 1.15  CALCIUM 8.6* 8.6* 8.9 8.4* 8.7*   GFR: Estimated Creatinine Clearance: 41.5 mL/min (by C-G formula based on SCr of 1.15 mg/dL). Liver Function Tests: No results for input(s): AST, ALT, ALKPHOS, BILITOT, PROT, ALBUMIN in the last 168 hours. No results for input(s): LIPASE, AMYLASE in the last 168 hours. No results for input(s): AMMONIA in the last 168 hours. Coagulation Profile: No results for input(s): INR, PROTIME in the last 168 hours. Cardiac Enzymes: No results for input(s): CKTOTAL, CKMB, CKMBINDEX, TROPONINI in the last 168 hours. BNP (last 3 results) No results for input(s): PROBNP in the last 8760 hours. HbA1C: No results for input(s): HGBA1C in the last 72 hours. CBG: No results for input(s): GLUCAP in the last 168 hours. Lipid Profile: No results for input(s): CHOL,  HDL, LDLCALC, TRIG, CHOLHDL, LDLDIRECT in the last 72 hours. Thyroid Function Tests: No results for input(s): TSH, T4TOTAL, FREET4, T3FREE, THYROIDAB in the last 72 hours. Anemia Panel: No results for input(s): VITAMINB12, FOLATE, FERRITIN, TIBC, IRON, RETICCTPCT in the last 72 hours.  Sepsis Labs: Recent Labs  Lab 03/31/21 1354 03/31/21 1935 04/03/21 0032 04/03/21 1230 04/04/21 0020 04/05/21 0102  PROCALCITON  --   --  <0.10  --  0.12 0.16  LATICACIDVEN 3.6* 2.4*  --  1.9 2.1*  --     Recent Results (from the past 240 hour(s))  Culture, blood (Routine x 2)     Status: Abnormal   Collection Time: 03/19/2021 12:55 PM   Specimen: BLOOD  Result Value Ref Range Status   Specimen Description BLOOD RIGHT ANTECUBITAL  Final   Special Requests   Final    BOTTLES DRAWN AEROBIC AND ANAEROBIC Blood Culture results may not be optimal due to an excessive volume of blood received in culture bottles   Culture  Setup Time   Final    GRAM NEGATIVE RODS AEROBIC BOTTLE ONLY CRITICAL RESULT CALLED TO, READ BACK BY AND VERIFIED WITHAndres Shad PHARMD 1327 03/31/21 A BROWNING Performed at [facility], [place] [address] [place], [place] [address]    Culture ENTEROBACTER CLOACAE (A)  Final   Report Status 04/02/2021 FINAL  Final   Organism ID, Bacteria ENTEROBACTER CLOACAE  Final      Susceptibility   Enterobacter cloacae - MIC*    CEFAZOLIN >=64 RESISTANT Resistant     CEFEPIME <=0.12 SENSITIVE Sensitive     CEFTAZIDIME <=1 SENSITIVE Sensitive     CIPROFLOXACIN <=0.25 SENSITIVE Sensitive     GENTAMICIN <=1 SENSITIVE Sensitive     IMIPENEM 1 SENSITIVE Sensitive     TRIMETH/SULFA >=320 RESISTANT Resistant     PIP/TAZO <=4 SENSITIVE Sensitive     * ENTEROBACTER CLOACAE  Blood Culture ID Panel (Reflexed)     Status: Abnormal   Collection Time: 04/07/2021 12:55 PM  Result Value Ref Range Status   Enterococcus faecalis NOT DETECTED NOT DETECTED Final   Enterococcus Faecium NOT DETECTED NOT  DETECTED Final   Listeria monocytogenes NOT DETECTED NOT DETECTED Final   Staphylococcus species NOT DETECTED NOT DETECTED Final   Staphylococcus aureus (BCID) NOT DETECTED NOT DETECTED Final   Staphylococcus epidermidis NOT DETECTED NOT DETECTED Final   Staphylococcus lugdunensis NOT DETECTED NOT DETECTED Final   Streptococcus species NOT DETECTED NOT DETECTED Final   Streptococcus agalactiae NOT DETECTED  NOT DETECTED Final   Streptococcus pneumoniae NOT DETECTED NOT DETECTED Final   Streptococcus pyogenes NOT DETECTED NOT DETECTED Final   A.calcoaceticus-baumannii NOT DETECTED NOT DETECTED Final   Bacteroides fragilis NOT DETECTED NOT DETECTED Final   Enterobacterales DETECTED (A) NOT DETECTED Final    Comment: Enterobacterales represent a large order of gram negative bacteria, not a single organism.   Enterobacter cloacae complex DETECTED (A) NOT DETECTED Final    Comment: CRITICAL RESULT CALLED TO, READ BACK BY AND VERIFIED WITH: Andres Shad PHARMD 1327 03/31/21 A BROWNING    Escherichia coli NOT DETECTED NOT DETECTED Final   Klebsiella aerogenes NOT DETECTED NOT DETECTED Final   Klebsiella oxytoca NOT DETECTED NOT DETECTED Final   Klebsiella pneumoniae NOT DETECTED NOT DETECTED Final   Proteus species NOT DETECTED NOT DETECTED Final   Salmonella species NOT DETECTED NOT DETECTED Final   Serratia marcescens NOT DETECTED NOT DETECTED Final   Haemophilus influenzae NOT DETECTED NOT DETECTED Final   Neisseria meningitidis NOT DETECTED NOT DETECTED Final   Pseudomonas aeruginosa NOT DETECTED NOT DETECTED Final   Stenotrophomonas maltophilia NOT DETECTED NOT DETECTED Final   Candida albicans NOT DETECTED NOT DETECTED Final   Candida auris NOT DETECTED NOT DETECTED Final   Candida glabrata NOT DETECTED NOT DETECTED Final   Candida krusei NOT DETECTED NOT DETECTED Final   Candida parapsilosis NOT DETECTED NOT DETECTED Final   Candida tropicalis NOT DETECTED NOT DETECTED Final    Cryptococcus neoformans/gattii NOT DETECTED NOT DETECTED Final   CTX-M ESBL NOT DETECTED NOT DETECTED Final   Carbapenem resistance IMP NOT DETECTED NOT DETECTED Final   Carbapenem resistance KPC NOT DETECTED NOT DETECTED Final   Carbapenem resistance NDM NOT DETECTED NOT DETECTED Final   Carbapenem resist OXA 48 LIKE NOT DETECTED NOT DETECTED Final   Carbapenem resistance VIM NOT DETECTED NOT DETECTED Final    Comment: Performed at [facility], [address], [place], [place] [address]  Resp Panel by RT-PCR (Flu A&B, Covid) Nasopharyngeal Swab     Status: None   Collection Time: 04/01/2021  1:00 PM   Specimen: Nasopharyngeal Swab; Nasopharyngeal(NP) swabs in vial transport medium  Result Value Ref Range Status   SARS Coronavirus 2 by RT PCR NEGATIVE NEGATIVE Final    Comment: (NOTE) SARS-CoV-2 target nucleic acids are NOT DETECTED.  The SARS-CoV-2 RNA is generally detectable in upper respiratory specimens during the acute phase of infection. The lowest concentration of SARS-CoV-2 viral copies this assay can detect is 138 copies/mL. A negative result does not preclude SARS-Cov-2 infection and should not be used as the sole basis for treatment or other patient management decisions. A negative result may occur with  improper specimen collection/handling, submission of specimen other than nasopharyngeal swab, presence of viral mutation(s) within the areas targeted by this assay, and inadequate number of viral copies(<138 copies/mL). A negative result must be combined with clinical observations, patient history, and epidemiological information. The expected result is Negative.  Fact Sheet for Patients:  EntrepreneurPulse.com.au  Fact Sheet for Healthcare Providers:  IncredibleEmployment.be  This test is no t yet approved or cleared by the [place] FDA and  has been authorized for detection and/or diagnosis of SARS-CoV-2 by FDA under an  Emergency Use Authorization (EUA). This EUA will remain  in effect (meaning this test can be used) for the duration of the COVID-19 declaration under Section 564(b)(1) of the Act, 21 U.S.C.section 360bbb-3(b)(1), unless the authorization is terminated  or revoked sooner.  Influenza A by PCR NEGATIVE NEGATIVE Final   Influenza B by PCR NEGATIVE NEGATIVE Final    Comment: (NOTE) The Xpert Xpress SARS-CoV-2/FLU/RSV plus assay is intended as an aid in the diagnosis of influenza from Nasopharyngeal swab specimens and should not be used as a sole basis for treatment. Nasal washings and aspirates are unacceptable for Xpert Xpress SARS-CoV-2/FLU/RSV testing.  Fact Sheet for Patients: EntrepreneurPulse.com.au  Fact Sheet for Healthcare Providers: IncredibleEmployment.be  This test is not yet approved or cleared by the [place] FDA and has been authorized for detection and/or diagnosis of SARS-CoV-2 by FDA under an Emergency Use Authorization (EUA). This EUA will remain in effect (meaning this test can be used) for the duration of the COVID-19 declaration under Section 564(b)(1) of the Act, 21 U.S.C. section 360bbb-3(b)(1), unless the authorization is terminated or revoked.  Performed at [facility], [place] [address], [place], [place] [address]   Surgical pcr screen     Status: None   Collection Time: 03/20/2021  5:23 PM   Specimen: Nasal Mucosa; Nasal Swab  Result Value Ref Range Status   MRSA, PCR NEGATIVE NEGATIVE Final   Staphylococcus aureus NEGATIVE NEGATIVE Final    Comment: (NOTE) The Xpert SA Assay (FDA approved for NASAL specimens in patients 17 years of age and older), is one component of a comprehensive surveillance program. It is not intended to diagnose infection nor to guide or monitor treatment. Performed at [facility], [place] [address] [place], [place] [address]   Culture, blood (Routine x 2)     Status:  None   Collection Time: 03/22/2021 10:53 PM   Specimen: BLOOD  Result Value Ref Range Status   Specimen Description BLOOD LEFT ANTECUBITAL  Final   Special Requests   Final    BOTTLES DRAWN AEROBIC ONLY Blood Culture adequate volume   Culture   Final    NO GROWTH 5 DAYS Performed at [facility], [place] [address], [place], [place] [address]    Report Status 04/04/2021 FINAL  Final  Urine Culture     Status: None   Collection Time: 03/31/21  6:33 AM   Specimen: Urine, Random  Result Value Ref Range Status   Specimen Description URINE, RANDOM  Final   Special Requests NONE  Final   Culture   Final    NO GROWTH Performed at [facility], [place] [address], [place], [place] [address]    Report Status 04/02/2021 FINAL  Final  Culture, blood (routine x 2)     Status: None (Preliminary result)   Collection Time: 04/02/21  2:12 PM   Specimen: BLOOD  Result Value Ref Range Status   Specimen Description BLOOD BLOOD RIGHT HAND  Final   Special Requests   Final    AEROBIC BOTTLE ONLY Blood Culture results may not be optimal due to an inadequate volume of blood received in culture bottles   Culture   Final    NO GROWTH 4 DAYS Performed at [facility], [place] [address] [place], [place] [address]    Report Status PENDING  Incomplete  Culture, blood (routine x 2)     Status: None (Preliminary result)   Collection Time: 04/02/21  2:21 PM   Specimen: BLOOD  Result Value Ref Range Status   Specimen Description BLOOD BLOOD RIGHT HAND  Final   Special Requests   Final    AEROBIC BOTTLE ONLY Blood Culture results may not be optimal due to  an inadequate volume of blood received in culture bottles   Culture   Final    NO GROWTH 4 DAYS Performed at [facility], [place] [address] [place], [place] [address]    Report Status PENDING  Incomplete  Resp Panel by RT-PCR (Flu A&B, Covid) Nasopharyngeal Swab     Status: None   Collection Time: 04/05/21  6:34 PM   Specimen:  Nasopharyngeal Swab; Nasopharyngeal(NP) swabs in vial transport medium  Result Value Ref Range Status   SARS Coronavirus 2 by RT PCR NEGATIVE NEGATIVE Final    Comment: (NOTE) SARS-CoV-2 target nucleic acids are NOT DETECTED.  The SARS-CoV-2 RNA is generally detectable in upper respiratory specimens during the acute phase of infection. The lowest concentration of SARS-CoV-2 viral copies this assay can detect is 138 copies/mL. A negative result does not preclude SARS-Cov-2 infection and should not be used as the sole basis for treatment or other patient management decisions. A negative result may occur with  improper specimen collection/handling, submission of specimen other than nasopharyngeal swab, presence of viral mutation(s) within the areas targeted by this assay, and inadequate number of viral copies(<138 copies/mL). A negative result must be combined with clinical observations, patient history, and epidemiological information. The expected result is Negative.  Fact Sheet for Patients:  EntrepreneurPulse.com.au  Fact Sheet for Healthcare Providers:  IncredibleEmployment.be  This test is no t yet approved or cleared by the [place] FDA and  has been authorized for detection and/or diagnosis of SARS-CoV-2 by FDA under an Emergency Use Authorization (EUA). This EUA will remain  in effect (meaning this test can be used) for the duration of the COVID-19 declaration under Section 564(b)(1) of the Act, 21 U.S.C.section 360bbb-3(b)(1), unless the authorization is terminated  or revoked sooner.       Influenza A by PCR NEGATIVE NEGATIVE Final   Influenza B by PCR NEGATIVE NEGATIVE Final    Comment: (NOTE) The Xpert Xpress SARS-CoV-2/FLU/RSV plus assay is intended as an aid in the diagnosis of influenza from Nasopharyngeal swab specimens and should not be used as a sole basis for treatment. Nasal washings and aspirates are unacceptable for  Xpert Xpress SARS-CoV-2/FLU/RSV testing.  Fact Sheet for Patients: EntrepreneurPulse.com.au  Fact Sheet for Healthcare Providers: IncredibleEmployment.be  This test is not yet approved or cleared by the [place] FDA and has been authorized for detection and/or diagnosis of SARS-CoV-2 by FDA under an Emergency Use Authorization (EUA). This EUA will remain in effect (meaning this test can be used) for the duration of the COVID-19 declaration under Section 564(b)(1) of the Act, 21 U.S.C. section 360bbb-3(b)(1), unless the authorization is terminated or revoked.  Performed at [facility], [place] [address] [place], [place] [address]           Radiology Studies: DG CHEST PORT 1 VIEW  Result Date: 04/06/2021 CLINICAL DATA:  Shortness of breath. EXAM: PORTABLE CHEST 1 VIEW COMPARISON:  04/04/2021.  08/26/2020.  CT 04/17/2021. FINDINGS: Cardiac pacer in stable position. Stable cardiomegaly. Chronic interstitial changes are again noted. Slight increase interstitial markings noted bilaterally suggesting superimposed active interstitial process including interstitial edema and/or pneumonitis. Tiny left pleural effusion cannot be excluded. No pneumothorax. IMPRESSION: 1.  Cardiac pacer stable position.  Stable cardiomegaly. 2. Chronic interstitial changes are again noted. Slight increase interstitial markings noted bilaterally suggesting superimposed active interstitial process including interstitial edema and/or pneumonitis. Tiny left pleural effusion cannot be excluded. Electronically Signed   By: Marcello Moores  Register   On: 04/06/2021  08:56        Scheduled Meds:  acetaminophen  1,000 mg Oral Q8H   albuterol  2.5 mg Nebulization TID   Chlorhexidine Gluconate Cloth  6 each Topical Daily   docusate sodium  100 mg Oral BID   feeding supplement  237 mL Oral BID BM   finasteride  5 mg Oral q morning   neomycin-bacitracin-polymyxin   Topical See  admin instructions   pantoprazole  40 mg Oral Q supper   polyethylene glycol  17 g Oral BID   [START ON 04/11/2021] predniSONE  20 mg Oral Q breakfast   [START ON 04/07/2021] predniSONE  40 mg Oral Q breakfast   pyridostigmine  60 mg Oral 4 times per day   Rivaroxaban  15 mg Oral BID WC   Followed by   Derrill Memo ON 04/20/2021] rivaroxaban  20 mg Oral Q supper   senna  1 tablet Oral Daily   tamsulosin  0.4 mg Oral Daily   Continuous Infusions:  ceFEPime (MAXIPIME) IV 2 g (04/06/21 0959)   sulfamethoxazole-trimethoprim       LOS: 7 days    Time spent: 35 minutes,.     Kathie Dike, MD [facility]   If 7PM-7AM, please contact night-coverage [email or web address]  04/06/2021, 4:41 PM

## 2021-04-06 NOTE — Progress Notes (Signed)
   04/06/21 1410  Assess: MEWS Score  BP (!) 99/59  Pulse Rate 88  Resp (!) 22  SpO2 92 %  O2 Device HFNC  Heater temperature 91.6 F (33.1 C)  O2 Flow Rate (L/min) 35 L/min  FiO2 (%) (S)  100 % (increased per sats)  Assess: MEWS Score  MEWS Temp 0  MEWS Systolic 1  MEWS Pulse 0  MEWS RR 1  MEWS LOC 0  MEWS Score 2  MEWS Score Color Yellow  Assess: if the MEWS score is Yellow or Red  Were vital signs taken at a resting state? Yes  Focused Assessment Change from prior assessment (see assessment flowsheet)  Early Detection of Sepsis Score *See Row Information* Low  MEWS guidelines implemented *See Row Information* Yes  Treat  MEWS Interventions Administered prn meds/treatments  Pain Scale PAINAD  Pain Score 0  Notify: Charge Nurse/RN  Name of Charge Nurse/RN Notified April  Date Charge Nurse/RN Notified 04/06/21  Document  Patient Outcome Other (Comment) (Pallative consult)  Progress note created (see row info) Yes  Will continue to monitor, patient is declining family to meet with palliative tomorrow. MD aware of patient condition

## 2021-04-07 ENCOUNTER — Telehealth: Payer: Self-pay | Admitting: Internal Medicine

## 2021-04-07 DIAGNOSIS — J9601 Acute respiratory failure with hypoxia: Secondary | ICD-10-CM | POA: Diagnosis not present

## 2021-04-07 DIAGNOSIS — J181 Lobar pneumonia, unspecified organism: Secondary | ICD-10-CM | POA: Diagnosis not present

## 2021-04-07 DIAGNOSIS — D849 Immunodeficiency, unspecified: Secondary | ICD-10-CM | POA: Diagnosis not present

## 2021-04-07 DIAGNOSIS — R451 Restlessness and agitation: Secondary | ICD-10-CM | POA: Diagnosis not present

## 2021-04-07 DIAGNOSIS — R7881 Bacteremia: Secondary | ICD-10-CM | POA: Diagnosis not present

## 2021-04-07 DIAGNOSIS — Z66 Do not resuscitate: Secondary | ICD-10-CM | POA: Diagnosis not present

## 2021-04-07 DIAGNOSIS — A419 Sepsis, unspecified organism: Secondary | ICD-10-CM | POA: Diagnosis not present

## 2021-04-07 DIAGNOSIS — I2699 Other pulmonary embolism without acute cor pulmonale: Secondary | ICD-10-CM | POA: Diagnosis not present

## 2021-04-07 LAB — CULTURE, BLOOD (ROUTINE X 2)
Culture: NO GROWTH
Culture: NO GROWTH

## 2021-04-07 LAB — RENAL FUNCTION PANEL
Albumin: 2.9 g/dL — ABNORMAL LOW (ref 3.5–5.0)
Anion gap: 7 (ref 5–15)
BUN: 25 mg/dL — ABNORMAL HIGH (ref 8–23)
CO2: 21 mmol/L — ABNORMAL LOW (ref 22–32)
Calcium: 8.5 mg/dL — ABNORMAL LOW (ref 8.9–10.3)
Chloride: 98 mmol/L (ref 98–111)
Creatinine, Ser: 1.29 mg/dL — ABNORMAL HIGH (ref 0.61–1.24)
GFR, Estimated: 53 mL/min — ABNORMAL LOW (ref >60)
Glucose, Bld: 91 mg/dL (ref 70–99)
Phosphorus: 2.7 mg/dL (ref 2.5–4.6)
Potassium: 4.4 mmol/L (ref 3.5–5.1)
Sodium: 126 mmol/L — ABNORMAL LOW (ref 135–145)

## 2021-04-07 LAB — CBC
HCT: 23.6 % — ABNORMAL LOW (ref 39.0–52.0)
Hemoglobin: 7.5 g/dL — ABNORMAL LOW (ref 13.0–17.0)
MCH: 30.7 pg (ref 26.0–34.0)
MCHC: 31.8 g/dL (ref 30.0–36.0)
MCV: 96.7 fL (ref 80.0–100.0)
Platelets: 139 10*3/uL — ABNORMAL LOW (ref 150–400)
RBC: 2.44 MIL/uL — ABNORMAL LOW (ref 4.22–5.81)
RDW: 18.6 % — ABNORMAL HIGH (ref 11.5–15.5)
WBC: 13.9 10*3/uL — ABNORMAL HIGH (ref 4.0–10.5)
nRBC: 0 % (ref 0.0–0.2)

## 2021-04-07 LAB — FUNGITELL, SERUM: Fungitell Result: 97 pg/mL — ABNORMAL HIGH (ref <80)

## 2021-04-07 MED ORDER — LORAZEPAM 2 MG/ML IJ SOLN
2.0000 mg | INTRAMUSCULAR | Status: DC | PRN
  Administered 2021-04-07 – 2021-04-08 (×4): 2 mg via INTRAVENOUS
  Filled 2021-04-07 (×4): qty 1

## 2021-04-07 MED ORDER — GLYCOPYRROLATE 0.2 MG/ML IJ SOLN
0.4000 mg | Freq: Three times a day (TID) | INTRAMUSCULAR | Status: DC
  Administered 2021-04-07 – 2021-04-08 (×2): 0.4 mg via INTRAVENOUS
  Filled 2021-04-07 (×2): qty 2

## 2021-04-07 MED ORDER — MORPHINE SULFATE (PF) 2 MG/ML IV SOLN
1.0000 mg | INTRAVENOUS | Status: DC | PRN
  Administered 2021-04-07 – 2021-04-08 (×5): 1 mg via INTRAVENOUS
  Filled 2021-04-07 (×5): qty 1

## 2021-04-07 NOTE — Progress Notes (Signed)
RT NOTE: RT attempted to get patient to perform NIF/VC per order. Patient refuses to do VC, he says he only does the NIF at home. Patient attempted to do NIF several times but is unable to close his lips and do so. Vitals are stable. RT will continue to monitor.

## 2021-04-07 NOTE — Progress Notes (Signed)
Patient on comfort care measures. Family is at bedside, comfort care tray also at bedside for family. Per Stanton Kidney with Palliative patient likes to listen to [facility], music is currently playing in the room.

## 2021-04-07 NOTE — Progress Notes (Signed)
Pharmacy Antibiotic Note  [name] is a 87 y.o. male admitted on 03/26/2021 with possible pneumocystis pneumonia.  Pharmacy has been consulted for Bactrim dosing. Cr rising, hyperK yesterday resolved.  Plan: Bactrim adjusted to 400mg  IV q8h Follow Cr, K closely F/U results of [facility]   Height: 5\' 7"  (170.2 cm) Weight: 79 kg (174 lb 2.6 oz) IBW/kg (Calculated) : 66.1  Temp (24hrs), Avg:97.9 F (36.6 C), Min:97.7 F (36.5 C), Max:98.1 F (36.7 C)  Recent Labs  Lab 03/31/21 1354 03/31/21 1935 04/01/21 0532 04/03/21 0032 04/03/21 1230 04/04/21 0020 04/05/21 0102 04/06/21 0618 04/07/21 0443  WBC  --   --    < > 14.9*  --  14.9* 11.4* 15.9* 13.9*  CREATININE  --   --    < > 0.90  --  1.00 0.93 1.15 1.29*  LATICACIDVEN 3.6* 2.4*  --   --  1.9 2.1*  --   --   --    < > = values in this interval not displayed.     Estimated Creatinine Clearance: 37 mL/min (A) (by C-G formula based on SCr of 1.29 mg/dL (H)).    Allergies  Allergen Reactions   Cefuroxime Axetil Other (See Comments)    Reaction not recalled   Cyclosporine Other (See Comments)    Reaction not recalled   Elemental Sulfur Other (See Comments)    No energy, could not walk    Maxzide [Triamterene-Hctz] Other (See Comments)    Adverse reaction- bloating and abdominal pain   Sulfa Antibiotics Other (See Comments)    Could not sleep/eat and experienced gastric pain   Sulfamethoxazole Other (See Comments)    Could not sleep/eat and had gastric pain   Sulfamethoxazole-Trimethoprim Other (See Comments)    Gastric pain and could not eat/sleep   Prednisone Other (See Comments)    Can tolerate for a short period of time    Antimicrobials this admission: Bactrim 7/18 >> Cefepime 7/14 >> 7/20  Thank you for allowing pharmacy to be a part of this patient's care.  Arrie Senate, PharmD, BCPS, [facility] Clinical Pharmacist [phone] Please check AMION for all [place] numbers 04/07/2021

## 2021-04-07 NOTE — Progress Notes (Signed)
PCCM note  Noted plans for comfort measures. PCCM will sign off. Please call with questions.  Marshell Garfinkel MD [facility] 04/07/2021, 4:59 PM

## 2021-04-07 NOTE — Progress Notes (Signed)
Patient ID: Ina A Fletchall, male   DOB: 11/11/1930, 87 y.o.   MRN: [id number]    Progress Note from the Palliative Medicine Team at [place]   Patient Name: Lakeith A Digangi        Date: 04/07/2021 DOB: 07/03/1931  Age: 87 y.o. MRN#: [id number] Attending Physician: Memon, Jehanzeb, MD Primary Care Physician: Harris, William, MD Admit Date: 04/11/2021   Medical records reviewed    Adian Wellen is a 87 y.o. male with a past medical history significant for dementia, HTN, sick sinus syndrome with dual chamber pacer, solitary kidney, dyslipidemia, myasthenia gravis on chronic steroids, CAD, BPH with chronic foley, and ORIF of right intertrochanteric femur fracture on 03/19/21 with discharge on 03/23/21 to [facility]. A few days post discharge pt developed right leg swelling and pain. US on 03/29/21 found right leg DVT and pt was started on Xarelto. Pt presented to ED on 04/02/2021 in respiratory distress and was hypotensive.  Today is day 8 of this hospital and patient continues to decline with full medical support.  WBC remains elevated at 13.9, creatinine 1.29, hemoglobin 7.5.  Patient with limited oral intake.  He is intermittently confused and easily agitated.    Family face treatment option decisions, advanced directive decisions and anticipatory care needs  This NP visited patient at the bedside as a follow up to  yesterday's GOCs meeting, and to meet with family as scheduled for continued conversation regarding goals of care.  Met with Robin/daughter/HPOA/documents brought in for scanning today.  Education offered on current medical situation, questions and concerns addressed to the best of my ability..  Conversation had around concept specific to human mortality and adult failure to thrive.  Education offered on the limitations of medical interventions to prolong quality of life when the body fails to thrive.  Daughter was grateful that her father was able to express to her today his desire for  comfort at this time in his life.   Daughter understands the seriousness of her father's current medical situation and the limited prognosis.  The difference between an aggressive medical intervention path and a palliative comfort path for this patient at this time in this situation was detailed.  Daughter is in agreement and supporting her father focusing on comfort, quality and dignity; allowing for natural death however she requests that high flow oxygen be left in place " a little longer".  Daughter understands that anything could happen at any time and that likely patient will not survive this hospitalization.  Plan of care: -DNR/DNI -No artificial feeding or hydration now or in the future -No further diagnostics -Discontinue unnecessary medications; antibiotics, steroids -Symptom management       -morphine 1 mg IV every 1 hour as needed for pain or dyspnea       -Ativan 2 mg IV every 4 hours as needed for anxiety/agitation       - Robinul 0.4 mg three times daily  -prognosis is likley hrs to days, expect a hospital death  Questions and concerns addressed   Discussed with Dr Memon   Total time spent on the unit was 75 minutes   Greater than 50% of the time was spent in counseling and coordination of care    NP  Palliative Medicine Team Team Phone # [phone] Pager [phone]   

## 2021-04-07 NOTE — Progress Notes (Signed)
PROGRESS NOTE    [name]  VQQ:[id number] DOB: 04-May-1931 DOA: 03/25/2021 PCP: Shirline Frees, MD   Brief Narrative: 87 year old with past medical history significant for right hip fracture status post ORIF with intramedullary nailing, urinary retention status post Foley catheter, myasthenia gravis on chronic Steroids, hypertension, BPH, dementia, essential tremor presents with hypotension.  Patient was recently discharged from the hospital 1 week ago after ORIF of the right hip.  Patient over the last 2 or 3 days developed right leg swelling and pain.  Doppler lower extremity was positive for DVT.  Patient was a started on Xarelto overnight at SNF>   The morning of admission patient was found to be in respiratory distress and an low blood pressure.  Patient was referred to the ED for further evaluation.  Patient was noted to have a rash on his left elbow.  Evaluation in the ED patient was noted to have borderline low blood pressure, hypoxia.  CTA positive for left lower lobe subsegmental PE.  Patient admitted with PE, DVT, acute hypoxic respiratory failure multifactorial secondary to pulmonary edema, PNA, ILD. He was found to have Enterobacter Cloaceae bacteremia, sepsis, and delirium.   He continue to required HF oxygen 15 L. He was started On IV bactrim to treat for PJP on 7/18. He was on stress dose steroids, transition to prednisone chronic home dose 7/19. He continue to be confuse. CCM is following.   Assessment & Plan:   Active Problems:   Pulmonary emboli (HCC)   Acute respiratory failure with hypoxia (HCC)   Hypotension   Bacteremia   Severe sepsis without septic shock (HCC)   Acute deep vein thrombosis (DVT) of proximal vein of right lower extremity (HCC)   Lobar pneumonia, unspecified organism ([place])   Immunosuppressed status ([place])   1-Subsegmental left lower lobe PE, DVT,  Acute Hypoxic Respiratory Failure.  Acute pulmonary edema secondary to acute diastolic HF  exacerbation:  PNA -Patient was treated initially on  heparin drip.  -He was  transition to [place] 7/16. -Chest X-ray 7/15: Progressive interstitial and ground glass opacities consistent with worsening edema or infection.  -Started on IV lasix 7/15 to help with pulmonary edema.  -Increase oxygen requirement on 7/17 up to 6 L. Repeated chest x ray Showed: similar appearance favored to reflect severe multilobar PNA> \-Pulmonologist consulted.  -Oxygen requirement increase to  35 L oxygen on 7/20, worsening hypoxemia.  -ID consulted: checking Fungitell pending. and Pneumocystis smear.  -Started on IV bactrim and steroids to cover for PJP 7/18. -repeat chest xray with possible edema, received a dose of lasix on 7/20 with 2.4 L urine output -Continues to have shortness of breath and is requiring 35 L oxygen   2-Anemia;  Hb down to 7.6. No evidence of active bleeding.  Received one unit PRBC.  Prior Hgb; 7.9 July 5.  Hemoglobin has been stable at 9.8, but has dropped down to 7.5 today No obvious signs of bleeding Continue to follow  3-Sepsis, 1/2 Blood culture positive for Enterobacter Cloaceae.  Presents with leukocytosis, hypotension, left elbow cellulitis.  Started on Cefepime 7/14. Doxy DC because Bactim was started on 7/18 Repeated Blood culture 7/16: no growth to date.  He completed course of cefepime on 7/20 Continued on IV Bactrim until PJP can be ruled out    Abdominal pain;  On IV protonix.  KUB: mild ileus, Moderate stool in the colon including moderate retained feces at the rectum. Had BM 7/14, 7/15. Continue with miralax, senna, PRN supp.  Resolved after BM  Chronic steroid use;  On chronic prednisone.  Received  IV hydrocortisone.  Currently on prednisone dosing to treat possible PJP  Myasthenia Gravis -chronically on prednisone and mestinon -unclear if this is contributing to resp failure -Depending on how aggressive family wishes to continue care, will follow  NIF/FVC   Hyponatremia:  Currently appears to be hypervolemic with significant lower extremity edema Treatment with Lasix limited due to low blood pressures  Recent ORIF, Right Hip:  Ortho : Dr Victorino December.  Evaluated by Ortho continue with staples and dry dressing PRN  Chronic Indwelling Foley catheter:  Needs to follow up with urology.  exchange foley   Delirium;  Risperidone to BID PRN agitation.   Patient with multiples medical problems, Acute hypoxic respiratory failure, ILD, PNA, PE.  Palliative care consulted for goals of care.   Goal  of care;  Palliative care following for goals of care.  Patiently has been made DNR.  Family meeting today to discuss further goals with palliative.  Overall prognosis is poor   Estimated body mass index is 27.28 kg/m as calculated from the following:   Height as of this encounter: 5\' 7"  (1.702 m).   Weight as of this encounter: 79 kg.   DVT prophylaxis: Xarelto Code Status: DNR Family Communication: Daughter Shirlean Mylar updated 7/20 Disposition Plan:  Status is: Inpatient  Remains inpatient appropriate because:Hemodynamically unstable  Dispo: The patient is from: SNF              Anticipated d/c is to: SNF              Patient currently is not medically stable to d/c.   Difficult to place patient No        Consultants:  Pulmonology Infectious disease Palliative care  Procedures:  ECHO : EF 37 to 60% with grade 3 diastolic dysfunction, right ventricular systolic function was mildly reduced  Antimicrobials:    Subjective: Remains on 35 L high flow oxygen.  Continues to feel short of breath.  Awakens easily by voice.   Objective: Vitals:   04/07/21 0315 04/07/21 0740 04/07/21 0832 04/07/21 1102  BP: (!) 104/59 101/61  105/70  Pulse: 77 83  92  Resp: (!) 25 (!) 22  (!) 24  Temp: 97.8 F (36.6 C) 97.8 F (36.6 C)  (!) 97.5 F (36.4 C)  TempSrc: Oral   Oral  SpO2: 95% 93% 92%   Weight:      Height:         Intake/Output Summary (Last 24 hours) at 04/07/2021 1131 Last data filed at 04/07/2021 1106 Gross per 24 hour  Intake --  Output 2650 ml  Net -2650 ml   Filed Weights   04/15/2021 1229 04/17/2021 2204  Weight: 73.5 kg 79 kg    Examination:  General exam: Alert, awake, no distress Respiratory system: crackles at bases.  Mild increased respiratory effort with conversation Cardiovascular system:RRR. No murmurs, rubs, gallops. Gastrointestinal system: Abdomen is nondistended, soft and nontender. No organomegaly or masses felt. Normal bowel sounds heard. Central nervous system: No focal neurological deficits. Extremities: 1+ edema bilaterally Skin: No rashes, lesions or ulcers Psychiatry: confused     Data Reviewed: I have personally reviewed following labs and imaging studies  CBC: Recent Labs  Lab 04/03/21 0032 04/04/21 0020 04/05/21 0102 04/06/21 0618 04/07/21 0443  WBC 14.9* 14.9* 11.4* 15.9* 13.9*  HGB 9.5* 9.9* 9.3* 9.8* 7.5*  HCT 30.0* 31.8* 30.5* 31.7* 23.6*  MCV 96.2 98.1 100.0  99.1 96.7  PLT 207 226 208 155 496*   Basic Metabolic Panel: Recent Labs  Lab 04/03/21 0032 04/04/21 0020 04/05/21 0102 04/06/21 0618 04/07/21 0443  NA 135 136 134* 130* 126*  K 3.5 3.7 4.4 5.5* 4.4  CL 101 101 104 102 98  CO2 24 25 23  16* 21*  GLUCOSE 135* 122* 88 93 91  BUN 18 25* 29* 29* 25*  CREATININE 0.90 1.00 0.93 1.15 1.29*  CALCIUM 8.6* 8.9 8.4* 8.7* 8.5*  PHOS  --   --   --   --  2.7   GFR: Estimated Creatinine Clearance: 37 mL/min (A) (by C-G formula based on SCr of 1.29 mg/dL (H)). Liver Function Tests: Recent Labs  Lab 04/07/21 0443  ALBUMIN 2.9*   No results for input(s): LIPASE, AMYLASE in the last 168 hours. No results for input(s): AMMONIA in the last 168 hours. Coagulation Profile: No results for input(s): INR, PROTIME in the last 168 hours. Cardiac Enzymes: No results for input(s): CKTOTAL, CKMB, CKMBINDEX, TROPONINI in the last 168 hours. BNP  (last 3 results) No results for input(s): PROBNP in the last 8760 hours. HbA1C: No results for input(s): HGBA1C in the last 72 hours. CBG: No results for input(s): GLUCAP in the last 168 hours. Lipid Profile: No results for input(s): CHOL, HDL, LDLCALC, TRIG, CHOLHDL, LDLDIRECT in the last 72 hours. Thyroid Function Tests: No results for input(s): TSH, T4TOTAL, FREET4, T3FREE, THYROIDAB in the last 72 hours. Anemia Panel: No results for input(s): VITAMINB12, FOLATE, FERRITIN, TIBC, IRON, RETICCTPCT in the last 72 hours.  Sepsis Labs: Recent Labs  Lab 03/31/21 1354 03/31/21 1935 04/03/21 0032 04/03/21 1230 04/04/21 0020 04/05/21 0102  PROCALCITON  --   --  <0.10  --  0.12 0.16  LATICACIDVEN 3.6* 2.4*  --  1.9 2.1*  --     Recent Results (from the past 240 hour(s))  Culture, blood (Routine x 2)     Status: Abnormal   Collection Time: 04/09/2021 12:55 PM   Specimen: BLOOD  Result Value Ref Range Status   Specimen Description BLOOD RIGHT ANTECUBITAL  Final   Special Requests   Final    BOTTLES DRAWN AEROBIC AND ANAEROBIC Blood Culture results may not be optimal due to an excessive volume of blood received in culture bottles   Culture  Setup Time   Final    GRAM NEGATIVE RODS AEROBIC BOTTLE ONLY CRITICAL RESULT CALLED TO, READ BACK BY AND VERIFIED WITHAndres Shad PHARMD 1327 03/31/21 A BROWNING Performed at [facility], [place] [address], [place], [place] [address]    Culture ENTEROBACTER CLOACAE (A)  Final   Report Status 04/02/2021 FINAL  Final   Organism ID, Bacteria ENTEROBACTER CLOACAE  Final      Susceptibility   Enterobacter cloacae - MIC*    CEFAZOLIN >=64 RESISTANT Resistant     CEFEPIME <=0.12 SENSITIVE Sensitive     CEFTAZIDIME <=1 SENSITIVE Sensitive     CIPROFLOXACIN <=0.25 SENSITIVE Sensitive     GENTAMICIN <=1 SENSITIVE Sensitive     IMIPENEM 1 SENSITIVE Sensitive     TRIMETH/SULFA >=320 RESISTANT Resistant     PIP/TAZO <=4 SENSITIVE Sensitive     *  ENTEROBACTER CLOACAE  Blood Culture ID Panel (Reflexed)     Status: Abnormal   Collection Time: 04/12/2021 12:55 PM  Result Value Ref Range Status   Enterococcus faecalis NOT DETECTED NOT DETECTED Final   Enterococcus Faecium NOT DETECTED NOT DETECTED Final   Listeria monocytogenes NOT  DETECTED NOT DETECTED Final   Staphylococcus species NOT DETECTED NOT DETECTED Final   Staphylococcus aureus (BCID) NOT DETECTED NOT DETECTED Final   Staphylococcus epidermidis NOT DETECTED NOT DETECTED Final   Staphylococcus lugdunensis NOT DETECTED NOT DETECTED Final   Streptococcus species NOT DETECTED NOT DETECTED Final   Streptococcus agalactiae NOT DETECTED NOT DETECTED Final   Streptococcus pneumoniae NOT DETECTED NOT DETECTED Final   Streptococcus pyogenes NOT DETECTED NOT DETECTED Final   A.calcoaceticus-baumannii NOT DETECTED NOT DETECTED Final   Bacteroides fragilis NOT DETECTED NOT DETECTED Final   Enterobacterales DETECTED (A) NOT DETECTED Final    Comment: Enterobacterales represent a large order of gram negative bacteria, not a single organism.   Enterobacter cloacae complex DETECTED (A) NOT DETECTED Final    Comment: CRITICAL RESULT CALLED TO, READ BACK BY AND VERIFIED WITH: Andres Shad PHARMD 1327 03/31/21 A BROWNING    Escherichia coli NOT DETECTED NOT DETECTED Final   Klebsiella aerogenes NOT DETECTED NOT DETECTED Final   Klebsiella oxytoca NOT DETECTED NOT DETECTED Final   Klebsiella pneumoniae NOT DETECTED NOT DETECTED Final   Proteus species NOT DETECTED NOT DETECTED Final   Salmonella species NOT DETECTED NOT DETECTED Final   Serratia marcescens NOT DETECTED NOT DETECTED Final   Haemophilus influenzae NOT DETECTED NOT DETECTED Final   Neisseria meningitidis NOT DETECTED NOT DETECTED Final   Pseudomonas aeruginosa NOT DETECTED NOT DETECTED Final   Stenotrophomonas maltophilia NOT DETECTED NOT DETECTED Final   Candida albicans NOT DETECTED NOT DETECTED Final   Candida auris NOT DETECTED  NOT DETECTED Final   Candida glabrata NOT DETECTED NOT DETECTED Final   Candida krusei NOT DETECTED NOT DETECTED Final   Candida parapsilosis NOT DETECTED NOT DETECTED Final   Candida tropicalis NOT DETECTED NOT DETECTED Final   Cryptococcus neoformans/gattii NOT DETECTED NOT DETECTED Final   CTX-M ESBL NOT DETECTED NOT DETECTED Final   Carbapenem resistance IMP NOT DETECTED NOT DETECTED Final   Carbapenem resistance KPC NOT DETECTED NOT DETECTED Final   Carbapenem resistance NDM NOT DETECTED NOT DETECTED Final   Carbapenem resist OXA 48 LIKE NOT DETECTED NOT DETECTED Final   Carbapenem resistance VIM NOT DETECTED NOT DETECTED Final    Comment: Performed at [facility], [address], [place], [place] [address]  Resp Panel by RT-PCR (Flu A&B, Covid) Nasopharyngeal Swab     Status: None   Collection Time: 03/28/2021  1:00 PM   Specimen: Nasopharyngeal Swab; Nasopharyngeal(NP) swabs in vial transport medium  Result Value Ref Range Status   SARS Coronavirus 2 by RT PCR NEGATIVE NEGATIVE Final    Comment: (NOTE) SARS-CoV-2 target nucleic acids are NOT DETECTED.  The SARS-CoV-2 RNA is generally detectable in upper respiratory specimens during the acute phase of infection. The lowest concentration of SARS-CoV-2 viral copies this assay can detect is 138 copies/mL. A negative result does not preclude SARS-Cov-2 infection and should not be used as the sole basis for treatment or other patient management decisions. A negative result may occur with  improper specimen collection/handling, submission of specimen other than nasopharyngeal swab, presence of viral mutation(s) within the areas targeted by this assay, and inadequate number of viral copies(<138 copies/mL). A negative result must be combined with clinical observations, patient history, and epidemiological information. The expected result is Negative.  Fact Sheet for Patients:  EntrepreneurPulse.com.au  Fact  Sheet for Healthcare Providers:  IncredibleEmployment.be  This test is no t yet approved or cleared by the [place] FDA and  has been  authorized for detection and/or diagnosis of SARS-CoV-2 by FDA under an Emergency Use Authorization (EUA). This EUA will remain  in effect (meaning this test can be used) for the duration of the COVID-19 declaration under Section 564(b)(1) of the Act, 21 U.S.C.section 360bbb-3(b)(1), unless the authorization is terminated  or revoked sooner.       Influenza A by PCR NEGATIVE NEGATIVE Final   Influenza B by PCR NEGATIVE NEGATIVE Final    Comment: (NOTE) The Xpert Xpress SARS-CoV-2/FLU/RSV plus assay is intended as an aid in the diagnosis of influenza from Nasopharyngeal swab specimens and should not be used as a sole basis for treatment. Nasal washings and aspirates are unacceptable for Xpert Xpress SARS-CoV-2/FLU/RSV testing.  Fact Sheet for Patients: EntrepreneurPulse.com.au  Fact Sheet for Healthcare Providers: IncredibleEmployment.be  This test is not yet approved or cleared by the [place] FDA and has been authorized for detection and/or diagnosis of SARS-CoV-2 by FDA under an Emergency Use Authorization (EUA). This EUA will remain in effect (meaning this test can be used) for the duration of the COVID-19 declaration under Section 564(b)(1) of the Act, 21 U.S.C. section 360bbb-3(b)(1), unless the authorization is terminated or revoked.  Performed at [facility], [place] [address] [place], [place] [address]   Surgical pcr screen     Status: None   Collection Time: 03/21/2021  5:23 PM   Specimen: Nasal Mucosa; Nasal Swab  Result Value Ref Range Status   MRSA, PCR NEGATIVE NEGATIVE Final   Staphylococcus aureus NEGATIVE NEGATIVE Final    Comment: (NOTE) The Xpert SA Assay (FDA approved for NASAL specimens in patients 66 years of age and older), is one component of a  comprehensive surveillance program. It is not intended to diagnose infection nor to guide or monitor treatment. Performed at [facility], [place] [address] [place], [place] [address]   Culture, blood (Routine x 2)     Status: None   Collection Time: 03/28/2021 10:53 PM   Specimen: BLOOD  Result Value Ref Range Status   Specimen Description BLOOD LEFT ANTECUBITAL  Final   Special Requests   Final    BOTTLES DRAWN AEROBIC ONLY Blood Culture adequate volume   Culture   Final    NO GROWTH 5 DAYS Performed at [facility], [place] [address], [place], [place] [address]    Report Status 04/04/2021 FINAL  Final  Urine Culture     Status: None   Collection Time: 03/31/21  6:33 AM   Specimen: Urine, Random  Result Value Ref Range Status   Specimen Description URINE, RANDOM  Final   Special Requests NONE  Final   Culture   Final    NO GROWTH Performed at [facility], [place] [address], [place], [place] [address]    Report Status 04/02/2021 FINAL  Final  Culture, blood (routine x 2)     Status: None (Preliminary result)   Collection Time: 04/02/21  2:12 PM   Specimen: BLOOD  Result Value Ref Range Status   Specimen Description BLOOD BLOOD RIGHT HAND  Final   Special Requests   Final    AEROBIC BOTTLE ONLY Blood Culture results may not be optimal due to an inadequate volume of blood received in culture bottles   Culture   Final    NO GROWTH 4 DAYS Performed at [facility], [place] [address] [place], [place] [address]    Report Status PENDING  Incomplete  Culture, blood (routine x  2)     Status: None (Preliminary result)   Collection Time: 04/02/21  2:21 PM   Specimen: BLOOD  Result Value Ref Range Status   Specimen Description BLOOD BLOOD RIGHT HAND  Final   Special Requests   Final    AEROBIC BOTTLE ONLY Blood Culture results may not be optimal due to an inadequate volume of blood received in culture bottles   Culture   Final    NO GROWTH 4 DAYS Performed at [facility], [place] [address] [place], [place] [address]    Report Status PENDING  Incomplete  Resp Panel by RT-PCR (Flu A&B, Covid) Nasopharyngeal Swab     Status: None   Collection Time: 04/05/21  6:34 PM   Specimen: Nasopharyngeal Swab; Nasopharyngeal(NP) swabs in vial transport medium  Result Value Ref Range Status   SARS Coronavirus 2 by RT PCR NEGATIVE NEGATIVE Final    Comment: (NOTE) SARS-CoV-2 target nucleic acids are NOT DETECTED.  The SARS-CoV-2 RNA is generally detectable in upper respiratory specimens during the acute phase of infection. The lowest concentration of SARS-CoV-2 viral copies this assay can detect is 138 copies/mL. A negative result does not preclude SARS-Cov-2 infection and should not be used as the sole basis for treatment or other patient management decisions. A negative result may occur with  improper specimen collection/handling, submission of specimen other than nasopharyngeal swab, presence of viral mutation(s) within the areas targeted by this assay, and inadequate number of viral copies(<138 copies/mL). A negative result must be combined with clinical observations, patient history, and epidemiological information. The expected result is Negative.  Fact Sheet for Patients:  EntrepreneurPulse.com.au  Fact Sheet for Healthcare Providers:  IncredibleEmployment.be  This test is no t yet approved or cleared by the [place] FDA and  has been authorized for detection and/or diagnosis of SARS-CoV-2 by FDA under an Emergency Use Authorization (EUA). This EUA will remain  in effect (meaning this test can be used) for the duration of the COVID-19 declaration under Section 564(b)(1) of the Act, 21 U.S.C.section 360bbb-3(b)(1), unless the authorization is terminated  or revoked sooner.       Influenza A by PCR NEGATIVE NEGATIVE Final   Influenza B by PCR NEGATIVE NEGATIVE Final    Comment: (NOTE) The Xpert Xpress  SARS-CoV-2/FLU/RSV plus assay is intended as an aid in the diagnosis of influenza from Nasopharyngeal swab specimens and should not be used as a sole basis for treatment. Nasal washings and aspirates are unacceptable for Xpert Xpress SARS-CoV-2/FLU/RSV testing.  Fact Sheet for Patients: EntrepreneurPulse.com.au  Fact Sheet for Healthcare Providers: IncredibleEmployment.be  This test is not yet approved or cleared by the [place] FDA and has been authorized for detection and/or diagnosis of SARS-CoV-2 by FDA under an Emergency Use Authorization (EUA). This EUA will remain in effect (meaning this test can be used) for the duration of the COVID-19 declaration under Section 564(b)(1) of the Act, 21 U.S.C. section 360bbb-3(b)(1), unless the authorization is terminated or revoked.  Performed at [facility], [place] [address] [place], [place] [address]           Radiology Studies: DG CHEST PORT 1 VIEW  Result Date: 04/06/2021 CLINICAL DATA:  Shortness of breath. EXAM: PORTABLE CHEST 1 VIEW COMPARISON:  04/04/2021.  08/26/2020.  CT 03/25/2021. FINDINGS: Cardiac pacer in stable position. Stable cardiomegaly. Chronic interstitial changes are again noted. Slight increase interstitial markings noted bilaterally suggesting superimposed active interstitial process including interstitial edema and/or pneumonitis. Tiny left  pleural effusion cannot be excluded. No pneumothorax. IMPRESSION: 1.  Cardiac pacer stable position.  Stable cardiomegaly. 2. Chronic interstitial changes are again noted. Slight increase interstitial markings noted bilaterally suggesting superimposed active interstitial process including interstitial edema and/or pneumonitis. Tiny left pleural effusion cannot be excluded. Electronically Signed   By: Marcello Moores  Register   On: 04/06/2021 08:56        Scheduled Meds:  acetaminophen  1,000 mg Oral Q8H   albuterol  2.5 mg Nebulization  TID   Chlorhexidine Gluconate Cloth  6 each Topical Daily   docusate sodium  100 mg Oral BID   feeding supplement  237 mL Oral BID BM   finasteride  5 mg Oral q morning   neomycin-bacitracin-polymyxin   Topical See admin instructions   pantoprazole  40 mg Oral Q supper   polyethylene glycol  17 g Oral BID   [START ON 04/11/2021] predniSONE  20 mg Oral Q breakfast   predniSONE  40 mg Oral Q breakfast   pyridostigmine  60 mg Oral 4 times per day   Rivaroxaban  15 mg Oral BID WC   Followed by   Derrill Memo ON 04/20/2021] rivaroxaban  20 mg Oral Q supper   senna  1 tablet Oral Daily   tamsulosin  0.4 mg Oral Daily   Continuous Infusions:  albumin human 25 g (04/07/21 0646)   sulfamethoxazole-trimethoprim 400 mg (04/07/21 0502)     LOS: 8 days    Time spent: 35 minutes.     Kathie Dike, MD [facility]   If 7PM-7AM, please contact night-coverage [email or web address]  04/07/2021, 11:31 AM

## 2021-04-07 NOTE — Progress Notes (Signed)
OT Cancellation Note  Patient Details Name: [name] MRN: [id number] DOB: [birth year]   Cancelled Treatment:    Reason Eval/Treat Not Completed: Medical issues which prohibited therapy. Per RN, OT should be held today due to pt not doing well, increased HFNC to 30L, Hypotensive, and pt family discussing comfort care. Acute OT will follow up as appropriate.  [name], OTR/L Acute Rehabilitation  [name] 04/07/2021, 9:35 AM

## 2021-04-07 NOTE — Progress Notes (Signed)
PT Cancellation Note  Patient Details Name: [name] MRN: [id number] DOB: [birth year]   Cancelled Treatment:    Reason Eval/Treat Not Completed: Medical issues which prohibited therapy.  RN asked to hold PT today.  Pt not well and may be transitioning to comfort soon.  Will check back tomorrow on pt's status and see as appropriate. 04/07/2021  Ginger Carne., PT Acute Rehabilitation Services [phone]  (pager) [phone]  (office)   Tessie Fass Fany Cavanaugh 04/07/2021, 2:29 PM

## 2021-04-07 NOTE — Telephone Encounter (Signed)
New message:    Patient care giver calling to let the doctor know that he is now with hospice and not doing to well. No need to call back. Just wanted to let the doctor know.

## 2021-04-07 NOTE — Progress Notes (Signed)
[facility] Infectious [facility]  Date of Admission:  03/21/2021     CC: enterobacter bacteremia                                       Lines:  Peripheral iv's   Abx: 7/18-c bactrim iv  7/14-7/20 cefepime 7/14-7/18 doxy                                                         Assessment: Enterobacter bacteremia Severe sepsis Hypoxemic resp distress/pna Chronic prednisone use >15mg /day Pulm fibrosis Dvt/PE Myasthenia gravis      87 yo male s/p recent 7/02 ORIF for right hip fx, copd/pulm fibrosis, presence of pacemaker, chronic foley for bladder outlet obstruction & urinary retention, myasthenia gravis on chronic steroid, dementia, copd/pulm fibrosis, presence of pacemaker, admitted 7/13 with severe sepsis found and hypxemic respiratory distress to have enterobacter bacteremia, RLE DVT/subsegmental PE started on anticoagulation this admission   7/13 bcx enterobacter cloacae (S cefepime, cipro; R cefazolin, bactrim). Unclear source of the enterobacter cloacae. Urine culture was negative. Has been on abx for several days. No fever here. Mild leukocytosis in setting of chronic steroid. Chronic foley not yet changed this admission.    Gram negative bsi low risk pacemaker involvement. Tte no obvious veg. Given his MG/morbidity, with prompt resolution of sepsis, would defer tee. If recurrent bacteremia reasonable to w/u for lead infection then   No issue with orif site. I doubt infection complication from enterobacter bsi   Myasthenia gravis on chronic steroid   Hypoxemic resp distress in setting sepsis, copd/pulm fibrosis, PE and perhaps worsening myasthenia gravis. No obvious sx of pna, but team treating as such. Reasonable. Of note, patient has been on moderate dose chronic steroid, so reasonble to w/u pjp if hypoxemia is worse (per team yes as of 7/18)  ---------- 7/21 assessment Unable to induce sputum for pjp cytology O2 requirement up to 35L 100% fio2  since 7/20 Pjp steroid dose started 7/20 Bactrim started 7/18 Fungitel pending still  Unclear if this is pjp. If fungitel negative would stop bactrim.  Renal function uptrending in setting bactrim    Plan:  F/u fungitell Continue bactrim and steroid pjp dose Monitor renal function -- if needed will swich bactrim to clindamycin/primaquine Discussed with primary team  I spent more than 35 minute reviewing data/chart, and coordinating care and >50% direct face to face time providing counseling/discussing diagnostics/treatment plan with patient     Active Problems:   Pulmonary emboli (HCC)   Acute respiratory failure with hypoxia (HCC)   Hypotension   Bacteremia   Severe sepsis without septic shock (HCC)   Acute deep vein thrombosis (DVT) of proximal vein of right lower extremity (HCC)   Lobar pneumonia, unspecified organism ([place])   Immunosuppressed status (HCC)   Allergies  Allergen Reactions   Cefuroxime Axetil Other (See Comments)    Reaction not recalled   Cyclosporine Other (See Comments)    Reaction not recalled   Elemental Sulfur Other (See Comments)    No energy, could not walk    Maxzide [Triamterene-Hctz] Other (See Comments)    Adverse reaction- bloating and abdominal pain   Sulfa Antibiotics Other (  See Comments)    Could not sleep/eat and experienced gastric pain   Sulfamethoxazole Other (See Comments)    Could not sleep/eat and had gastric pain   Sulfamethoxazole-Trimethoprim Other (See Comments)    Gastric pain and could not eat/sleep   Prednisone Other (See Comments)    Can tolerate for a short period of time    Scheduled Meds:  acetaminophen  1,000 mg Oral Q8H   albuterol  2.5 mg Nebulization TID   Chlorhexidine Gluconate Cloth  6 each Topical Daily   docusate sodium  100 mg Oral BID   feeding supplement  237 mL Oral BID BM   finasteride  5 mg Oral q morning   neomycin-bacitracin-polymyxin   Topical See admin instructions   pantoprazole  40 mg  Oral Q supper   polyethylene glycol  17 g Oral BID   [START ON 04/11/2021] predniSONE  20 mg Oral Q breakfast   predniSONE  40 mg Oral Q breakfast   pyridostigmine  60 mg Oral 4 times per day   Rivaroxaban  15 mg Oral BID WC   Followed by   Derrill Memo ON 04/20/2021] rivaroxaban  20 mg Oral Q supper   senna  1 tablet Oral Daily   tamsulosin  0.4 mg Oral Daily   Continuous Infusions:  albumin human 25 g (04/07/21 0646)   sulfamethoxazole-trimethoprim 400 mg (04/07/21 0502)   PRN Meds:.alum & mag hydroxide-simeth, bisacodyl, guaiFENesin-dextromethorphan, LORazepam, methocarbamol, ondansetron, oxyCODONE, risperiDONE   SUBJECTIVE: Afebrile; no n/v No chest pain Remains on 35 l and 100% fio2; despite this saying he is not sob Minimal pt/ot possible due to this   Fungitel pending Creatinine uptrending  Review of Systems: ROS All other ROS was negative, except mentioned above     OBJECTIVE: Vitals:   04/07/21 0315 04/07/21 0740 04/07/21 0832 04/07/21 1102  BP: (!) 104/59 101/61  105/70  Pulse: 77 83  92  Resp: (!) 25 (!) 22  (!) 24  Temp: 97.8 F (36.6 C) 97.8 F (36.6 C)  (!) 97.5 F (36.4 C)  TempSrc: Oral   Oral  SpO2: 95% 93% 92%   Weight:      Height:       Body mass index is 27.28 kg/m.  Physical Exam General/constitutional: no distress, sleeping but arousable and cooperative HEENT: Normocephalic, PER, Conj Clear, EOMI, Oropharynx clear Neck supple CV: rrr no mrg Lungs: clear to auscultation, normal respiratory effort Abd: Soft, Nontender Ext: RLE edema > Left LE Skin: No Rash Neuro: nonfocal MSK: no peripheral joint swelling/tenderness/warmth; back spines nontender   Central line presence: none     Lab Results Lab Results  Component Value Date   WBC 13.9 (H) 04/07/2021   HGB 7.5 (L) 04/07/2021   HCT 23.6 (L) 04/07/2021   MCV 96.7 04/07/2021   PLT 139 (L) 04/07/2021    Lab Results  Component Value Date   CREATININE 1.29 (H) 04/07/2021   BUN 25  (H) 04/07/2021   NA 126 (L) 04/07/2021   K 4.4 04/07/2021   CL 98 04/07/2021   CO2 21 (L) 04/07/2021    Lab Results  Component Value Date   ALT 15 03/22/2021   AST 20 04/12/2021   ALKPHOS 92 04/06/2021   BILITOT 1.3 (H) 03/31/2021      Microbiology: Recent Results (from the past 240 hour(s))  Culture, blood (Routine x 2)     Status: Abnormal   Collection Time: 03/19/2021 12:55 PM   Specimen: BLOOD  Result Value Ref  Range Status   Specimen Description BLOOD RIGHT ANTECUBITAL  Final   Special Requests   Final    BOTTLES DRAWN AEROBIC AND ANAEROBIC Blood Culture results may not be optimal due to an excessive volume of blood received in culture bottles   Culture  Setup Time   Final    GRAM NEGATIVE RODS AEROBIC BOTTLE ONLY CRITICAL RESULT CALLED TO, READ BACK BY AND VERIFIED WITHAndres Shad [facility] 7106 03/31/21 A BROWNING Performed at [facility], [place] [address], [place], [place] [address]    Culture ENTEROBACTER CLOACAE (A)  Final   Report Status 04/02/2021 FINAL  Final   Organism ID, Bacteria ENTEROBACTER CLOACAE  Final      Susceptibility   Enterobacter cloacae - MIC*    CEFAZOLIN >=64 RESISTANT Resistant     CEFEPIME <=0.12 SENSITIVE Sensitive     CEFTAZIDIME <=1 SENSITIVE Sensitive     CIPROFLOXACIN <=0.25 SENSITIVE Sensitive     GENTAMICIN <=1 SENSITIVE Sensitive     IMIPENEM 1 SENSITIVE Sensitive     TRIMETH/SULFA >=320 RESISTANT Resistant     PIP/TAZO <=4 SENSITIVE Sensitive     * ENTEROBACTER CLOACAE  Blood Culture ID Panel (Reflexed)     Status: Abnormal   Collection Time: 04/15/2021 12:55 PM  Result Value Ref Range Status   Enterococcus faecalis NOT DETECTED NOT DETECTED Final   Enterococcus Faecium NOT DETECTED NOT DETECTED Final   Listeria monocytogenes NOT DETECTED NOT DETECTED Final   Staphylococcus species NOT DETECTED NOT DETECTED Final   Staphylococcus aureus (BCID) NOT DETECTED NOT DETECTED Final   Staphylococcus epidermidis NOT DETECTED NOT DETECTED  Final   Staphylococcus lugdunensis NOT DETECTED NOT DETECTED Final   Streptococcus species NOT DETECTED NOT DETECTED Final   Streptococcus agalactiae NOT DETECTED NOT DETECTED Final   Streptococcus pneumoniae NOT DETECTED NOT DETECTED Final   Streptococcus pyogenes NOT DETECTED NOT DETECTED Final   A.calcoaceticus-baumannii NOT DETECTED NOT DETECTED Final   Bacteroides fragilis NOT DETECTED NOT DETECTED Final   Enterobacterales DETECTED (A) NOT DETECTED Final    Comment: Enterobacterales represent a large order of gram negative bacteria, not a single organism.   Enterobacter cloacae complex DETECTED (A) NOT DETECTED Final    Comment: CRITICAL RESULT CALLED TO, READ BACK BY AND VERIFIED WITH: Andres Shad PHARMD 1327 03/31/21 A BROWNING    Escherichia coli NOT DETECTED NOT DETECTED Final   Klebsiella aerogenes NOT DETECTED NOT DETECTED Final   Klebsiella oxytoca NOT DETECTED NOT DETECTED Final   Klebsiella pneumoniae NOT DETECTED NOT DETECTED Final   Proteus species NOT DETECTED NOT DETECTED Final   Salmonella species NOT DETECTED NOT DETECTED Final   Serratia marcescens NOT DETECTED NOT DETECTED Final   Haemophilus influenzae NOT DETECTED NOT DETECTED Final   Neisseria meningitidis NOT DETECTED NOT DETECTED Final   Pseudomonas aeruginosa NOT DETECTED NOT DETECTED Final   Stenotrophomonas maltophilia NOT DETECTED NOT DETECTED Final   Candida albicans NOT DETECTED NOT DETECTED Final   Candida auris NOT DETECTED NOT DETECTED Final   Candida glabrata NOT DETECTED NOT DETECTED Final   Candida krusei NOT DETECTED NOT DETECTED Final   Candida parapsilosis NOT DETECTED NOT DETECTED Final   Candida tropicalis NOT DETECTED NOT DETECTED Final   Cryptococcus neoformans/gattii NOT DETECTED NOT DETECTED Final   CTX-M ESBL NOT DETECTED NOT DETECTED Final   Carbapenem resistance IMP NOT DETECTED NOT DETECTED Final   Carbapenem resistance KPC NOT DETECTED NOT DETECTED Final   Carbapenem resistance NDM  NOT DETECTED NOT  DETECTED Final   Carbapenem resist OXA 48 LIKE NOT DETECTED NOT DETECTED Final   Carbapenem resistance VIM NOT DETECTED NOT DETECTED Final    Comment: Performed at [facility], [place] [address] [place], [place] [address]  Resp Panel by RT-PCR (Flu A&B, Covid) Nasopharyngeal Swab     Status: None   Collection Time: 04/04/2021  1:00 PM   Specimen: Nasopharyngeal Swab; Nasopharyngeal(NP) swabs in vial transport medium  Result Value Ref Range Status   SARS Coronavirus 2 by RT PCR NEGATIVE NEGATIVE Final    Comment: (NOTE) SARS-CoV-2 target nucleic acids are NOT DETECTED.  The SARS-CoV-2 RNA is generally detectable in upper respiratory specimens during the acute phase of infection. The lowest concentration of SARS-CoV-2 viral copies this assay can detect is 138 copies/mL. A negative result does not preclude SARS-Cov-2 infection and should not be used as the sole basis for treatment or other patient management decisions. A negative result may occur with  improper specimen collection/handling, submission of specimen other than nasopharyngeal swab, presence of viral mutation(s) within the areas targeted by this assay, and inadequate number of viral copies(<138 copies/mL). A negative result must be combined with clinical observations, patient history, and epidemiological information. The expected result is Negative.  Fact Sheet for Patients:  EntrepreneurPulse.com.au  Fact Sheet for Healthcare Providers:  IncredibleEmployment.be  This test is no t yet approved or cleared by the [place] FDA and  has been authorized for detection and/or diagnosis of SARS-CoV-2 by FDA under an Emergency Use Authorization (EUA). This EUA will remain  in effect (meaning this test can be used) for the duration of the COVID-19 declaration under Section 564(b)(1) of the Act, 21 U.S.C.section 360bbb-3(b)(1), unless the authorization is terminated  or  revoked sooner.       Influenza A by PCR NEGATIVE NEGATIVE Final   Influenza B by PCR NEGATIVE NEGATIVE Final    Comment: (NOTE) The Xpert Xpress SARS-CoV-2/FLU/RSV plus assay is intended as an aid in the diagnosis of influenza from Nasopharyngeal swab specimens and should not be used as a sole basis for treatment. Nasal washings and aspirates are unacceptable for Xpert Xpress SARS-CoV-2/FLU/RSV testing.  Fact Sheet for Patients: EntrepreneurPulse.com.au  Fact Sheet for Healthcare Providers: IncredibleEmployment.be  This test is not yet approved or cleared by the [place] FDA and has been authorized for detection and/or diagnosis of SARS-CoV-2 by FDA under an Emergency Use Authorization (EUA). This EUA will remain in effect (meaning this test can be used) for the duration of the COVID-19 declaration under Section 564(b)(1) of the Act, 21 U.S.C. section 360bbb-3(b)(1), unless the authorization is terminated or revoked.  Performed at [facility], [place] [address] [place], [place] [address]   Surgical pcr screen     Status: None   Collection Time: 04/03/2021  5:23 PM   Specimen: Nasal Mucosa; Nasal Swab  Result Value Ref Range Status   MRSA, PCR NEGATIVE NEGATIVE Final   Staphylococcus aureus NEGATIVE NEGATIVE Final    Comment: (NOTE) The Xpert SA Assay (FDA approved for NASAL specimens in patients 75 years of age and older), is one component of a comprehensive surveillance program. It is not intended to diagnose infection nor to guide or monitor treatment. Performed at [facility], [place] [address] [place], [place] [address]   Culture, blood (Routine x 2)     Status: None   Collection Time: 03/31/2021 10:53 PM   Specimen: BLOOD  Result Value Ref Range Status  Specimen Description BLOOD LEFT ANTECUBITAL  Final   Special Requests   Final    BOTTLES DRAWN AEROBIC ONLY Blood Culture adequate volume   Culture   Final    NO  GROWTH 5 DAYS Performed at [facility], [address], [place], [place] [address]    Report Status 04/04/2021 FINAL  Final  Urine Culture     Status: None   Collection Time: 03/31/21  6:33 AM   Specimen: Urine, Random  Result Value Ref Range Status   Specimen Description URINE, RANDOM  Final   Special Requests NONE  Final   Culture   Final    NO GROWTH Performed at [facility], [place] [address] [place], [place] [address]    Report Status 04/02/2021 FINAL  Final  Culture, blood (routine x 2)     Status: None (Preliminary result)   Collection Time: 04/02/21  2:12 PM   Specimen: BLOOD  Result Value Ref Range Status   Specimen Description BLOOD BLOOD RIGHT HAND  Final   Special Requests   Final    AEROBIC BOTTLE ONLY Blood Culture results may not be optimal due to an inadequate volume of blood received in culture bottles   Culture   Final    NO GROWTH 4 DAYS Performed at [facility], [place] [address], [place],  [address]    Report Status PENDING  Incomplete  Culture, blood (routine x 2)     Status: None (Preliminary result)   Collection Time: 04/02/21  2:21 PM   Specimen: BLOOD  Result Value Ref Range Status   Specimen Description BLOOD BLOOD RIGHT HAND  Final   Special Requests   Final    AEROBIC BOTTLE ONLY Blood Culture results may not be optimal due to an inadequate volume of blood received in culture bottles   Culture   Final    NO GROWTH 4 DAYS Performed at [facility], [place] [address], [place],  [address]    Report Status PENDING  Incomplete  Resp Panel by RT-PCR (Flu A&B, Covid) Nasopharyngeal Swab     Status: None   Collection Time: 04/05/21  6:34 PM   Specimen: Nasopharyngeal Swab; Nasopharyngeal(NP) swabs in vial transport medium  Result Value Ref Range Status   SARS Coronavirus 2 by RT PCR NEGATIVE NEGATIVE Final    Comment: (NOTE) SARS-CoV-2 target nucleic acids are NOT DETECTED.  The SARS-CoV-2 RNA is generally detectable  in upper respiratory specimens during the acute phase of infection. The lowest concentration of SARS-CoV-2 viral copies this assay can detect is 138 copies/mL. A negative result does not preclude SARS-Cov-2 infection and should not be used as the sole basis for treatment or other patient management decisions. A negative result may occur with  improper specimen collection/handling, submission of specimen other than nasopharyngeal swab, presence of viral mutation(s) within the areas targeted by this assay, and inadequate number of viral copies(<138 copies/mL). A negative result must be combined with clinical observations, patient history, and epidemiological information. The expected result is Negative.  Fact Sheet for Patients:  EntrepreneurPulse.com.au  Fact Sheet for Healthcare Providers:  IncredibleEmployment.be  This test is no t yet approved or cleared by the [place] FDA and  has been authorized for detection and/or diagnosis of SARS-CoV-2 by FDA under an Emergency Use Authorization (EUA). This EUA will remain  in effect (meaning this test can be used) for the duration of the COVID-19 declaration under Section 564(b)(1) of the Act,  21 U.S.C.section 360bbb-3(b)(1), unless the authorization is terminated  or revoked sooner.       Influenza A by PCR NEGATIVE NEGATIVE Final   Influenza B by PCR NEGATIVE NEGATIVE Final    Comment: (NOTE) The Xpert Xpress SARS-CoV-2/FLU/RSV plus assay is intended as an aid in the diagnosis of influenza from Nasopharyngeal swab specimens and should not be used as a sole basis for treatment. Nasal washings and aspirates are unacceptable for Xpert Xpress SARS-CoV-2/FLU/RSV testing.  Fact Sheet for Patients: EntrepreneurPulse.com.au  Fact Sheet for Healthcare Providers: IncredibleEmployment.be  This test is not yet approved or cleared by the [place] FDA and has  been authorized for detection and/or diagnosis of SARS-CoV-2 by FDA under an Emergency Use Authorization (EUA). This EUA will remain in effect (meaning this test can be used) for the duration of the COVID-19 declaration under Section 564(b)(1) of the Act, 21 U.S.C. section 360bbb-3(b)(1), unless the authorization is terminated or revoked.  Performed at [facility], [place] [address] [place], [place] [address]      Serology:   Imaging: If present, new imagings (plain films, ct scans, and mri) have been personally visualized and interpreted; radiology reports have been reviewed. Decision making incorporated into the Impression / Recommendations.  7/14 tte  1. Left ventricular ejection fraction, by estimation, is 55 to 60%. The  left ventricle has normal function. The left ventricle has no regional  wall motion abnormalities. Left ventricular diastolic parameters are  consistent with Grade II diastolic  dysfunction (pseudonormalization).   2. Peak RV-RA gradient 60.5 mmHg. The IVC was poorly visualized. Right  ventricular systolic function is mildly reduced. The right ventricular  size is mildly enlarged.   3. Left atrial size was mildly dilated.   4. The mitral valve is normal in structure. Moderate mitral valve  regurgitation. No evidence of mitral stenosis.   5. The aortic valve is tricuspid. Aortic valve regurgitation is mild.  Mild aortic valve sclerosis is present, with no evidence of aortic valve  stenosis.   7/13 cta chest 1. Occlusive pulmonary embolus involving a segmental branch of the left lower lobe. 2. Chronic interstitial lung disease. 3.  Aortic Atherosclerosis (ICD10-I70.0). 4. Multi vessel coronary artery atherosclerosis.  Jabier Mutton, [facility] [place] [phone] pager    04/07/2021, 11:57 AM

## 2021-04-08 ENCOUNTER — Ambulatory Visit: Payer: Medicare Other | Admitting: Internal Medicine

## 2021-04-08 ENCOUNTER — Encounter (HOSPITAL_COMMUNITY): Payer: Self-pay | Admitting: Internal Medicine

## 2021-04-08 DIAGNOSIS — Z7189 Other specified counseling: Secondary | ICD-10-CM

## 2021-04-08 DIAGNOSIS — J9601 Acute respiratory failure with hypoxia: Secondary | ICD-10-CM | POA: Diagnosis not present

## 2021-04-08 DIAGNOSIS — I824Y1 Acute embolism and thrombosis of unspecified deep veins of right proximal lower extremity: Secondary | ICD-10-CM | POA: Diagnosis not present

## 2021-04-08 DIAGNOSIS — Z515 Encounter for palliative care: Secondary | ICD-10-CM | POA: Diagnosis not present

## 2021-04-08 DIAGNOSIS — I2699 Other pulmonary embolism without acute cor pulmonale: Secondary | ICD-10-CM | POA: Diagnosis not present

## 2021-04-08 DIAGNOSIS — R7881 Bacteremia: Secondary | ICD-10-CM | POA: Diagnosis not present

## 2021-04-08 MED ORDER — MORPHINE BOLUS VIA INFUSION
2.0000 mg | INTRAVENOUS | Status: DC | PRN
  Administered 2021-04-08 (×4): 2 mg via INTRAVENOUS
  Filled 2021-04-08: qty 2

## 2021-04-08 MED ORDER — MORPHINE 100MG IN NS 100ML (1MG/ML) PREMIX INFUSION
2.0000 mg/h | INTRAVENOUS | Status: DC
  Administered 2021-04-08: 2 mg/h via INTRAVENOUS
  Filled 2021-04-08: qty 100

## 2021-04-12 ENCOUNTER — Ambulatory Visit (HOSPITAL_COMMUNITY): Payer: Medicare Other

## 2021-04-18 NOTE — Progress Notes (Signed)
Noted patient transitioned to comfort care.   ID will sign off

## 2021-04-18 NOTE — Progress Notes (Signed)
Order placed to deactivate PPM - spoke with [place] Mickel Baas who stated no need to turn off device. Primary RN, Judson Roch and Raford Pitcher, made aware.

## 2021-04-18 NOTE — TOC Progression Note (Signed)
Transition of Care [facility]) - Progression Note    Patient Details  Name: [name] MRN: [id number] Date of Birth: July 13, 1931  [facility] CM/SW Contact  Reece Agar, [place] Phone Number: 04-21-2021, 4:14 PM  Clinical Narrative:    Pt has passed away CSW spoke with Alfredo Bach, to alert that pt will not return. CSW no longer following.   Expected Discharge Plan: [place] Barriers to Discharge: Continued Medical Work up  Expected Discharge Plan and Services Expected Discharge Plan: [place] In-house Referral: Clinical Social Work   Post Acute Care Choice: [place] Living arrangements for the past 2 months: [place] Expected Discharge Date: April 21, 2021                                     Social Determinants of Health (SDOH) Interventions    Readmission Risk Interventions No flowsheet data found.

## 2021-04-18 NOTE — Progress Notes (Signed)
HFNC off  [place] at 2 liters

## 2021-04-18 NOTE — Progress Notes (Addendum)
Oxygen decreased to 20L HFNC  300 pm oxygen decreased to 10L HFNC

## 2021-04-18 NOTE — Death Summary Note (Signed)
DEATH SUMMARY   Patient Details  Name: [name] MRN: [id number] DOB: 1931/05/24  Admission/Discharge Information   Admit Date:  Apr 10, 2021  Date of Death: Date of Death: 2021/04/19  Time of Death: Time of Death: 12/15/1529  Length of Stay: December 06, 2022  Referring Physician: Shirline Frees, MD   Reason(s) for Hospitalization  Acute Pulmonary Embolism  Diagnoses  Preliminary cause of death:  Secondary Diagnoses (including complications and co-morbidities):  Active Problems:   Pulmonary emboli (HCC)   Acute respiratory failure with hypoxia (HCC)   Hypotension   Bacteremia   Severe sepsis without septic shock (HCC)   Acute deep vein thrombosis (DVT) of proximal vein of right lower extremity (HCC)   Lobar pneumonia, unspecified organism ([place])   Immunosuppressed status [facility])   Brief Hospital Course (including significant findings, care, treatment, and services provided and events leading to death)  [name] is a 87 y.o. year old male who presented initially after having a fall suffering a right hip fracture.  He was status post ORIF with intramedullary nailing and was admitted to rehab.  While in rehab, he was positive for acute lower extremity DVT and started on Xarelto.  The day of admission, patient was found to have respiratory distress with associated hypotension and he was referred to the emergency department for evaluation.  He was found to have developed an acute PE and was transferred back to the acute medical side.  Respiratory failure was multifactorial and secondary to PE, pulm edema, pneumonia, interstitial lung disease and while admitted he was found to have Enterobacter Cloaceae bacteremia and sepsis.  Disease consulted and he was also started on IV Bactrim for possible PJP.  Patient's respiratory status continued to deteriorate with increasing oxygen needs up to 35 L via heated high flow nasal cannula.  Palliative care was consulted and decision was made to transition to full comfort  measures per patient desire.    Pertinent Labs and Studies  Significant Diagnostic Studies DG Chest 1 View  Result Date: 03/18/2021 CLINICAL DATA:  Fall with deformity EXAM: CHEST  1 VIEW COMPARISON:  08/26/2020, 05/12/2020, 04/09/2020 FINDINGS: Apical pleural thickening. Left-sided pacing device as before. Bilateral peripheral and lower lung reticular opacity consistent with interstitial fibrosis and chronic lung disease. No definite acute superimposed airspace disease, pleural effusion, or pneumothorax. Stable cardiomediastinal silhouette with aortic atherosclerosis. IMPRESSION: Chronic interstitial lung disease without definite acute interval change since 08/26/2020. Electronically Signed   By: Donavan Foil M.D.   On: 03/18/2021 21:58   DG Chest 2 View  Result Date: 10-Apr-2021 CLINICAL DATA:  Sepsis EXAM: CHEST - 2 VIEW COMPARISON:  03/18/2021 FINDINGS: Bilateral diffuse interstitial thickening consistent with chronic interstitial lung disease. No focal consolidation, pleural effusion or pneumothorax. Stable cardiomegaly. Thoracic aortic atherosclerosis. Dual lead cardiac pacemaker. No acute osseous abnormality. IMPRESSION: No acute cardiopulmonary disease. Chronic interstitial lung disease. Electronically Signed   By: Kathreen Devoid   On: 04-10-21 13:08   DG Pelvis 1-2 Views  Result Date: 03/18/2021 CLINICAL DATA:  Fall with right femur deformity EXAM: PELVIS - 1-2 VIEW COMPARISON:  None. FINDINGS: Pubic symphysis and rami appear intact. Both femoral heads project in joint. SI joints are non widened. Acute comminuted right intertrochanteric fracture with displaced lesser trochanteric fracture fragments. IMPRESSION: Acute comminuted and displaced right intertrochanteric fracture Electronically Signed   By: Donavan Foil M.D.   On: 03/18/2021 22:00   DG Elbow 2 Views Left  Result Date: 2021-04-10 CLINICAL DATA:  Left elbow swelling. EXAM: LEFT  ELBOW - 2 VIEW COMPARISON:  None. FINDINGS: Diffuse  soft tissue swelling is present in the left upper extremity both above and below the elbow. This is most significant posterior to the humerus. No underlying fracture or effusion is present. IMPRESSION: Diffuse soft tissue swelling without underlying fracture or effusion. Question cellulitis or obstructive edema. Electronically Signed   By: San Morelle M.D.   On: 04/10/2021 18:09   DG Tibia/Fibula Right  Result Date: 03/18/2021 CLINICAL DATA:  Fall with right femur deformity EXAM: RIGHT TIBIA AND FIBULA - 2 VIEW COMPARISON:  None. FINDINGS: There is no evidence of fracture or other focal bone lesions. Soft tissues are unremarkable. IMPRESSION: Negative. Electronically Signed   By: Donavan Foil M.D.   On: 03/18/2021 21:59   DG Abd 1 View  Result Date: 04/01/2021 CLINICAL DATA:  Abdomen pain EXAM: ABDOMEN - 1 VIEW COMPARISON:  CT 11/23/2008 FINDINGS: Mild diffuse increased bowel gas throughout. Moderate stool in the colon including moderate impacted feces at the rectum. No radiopaque calculi. Scoliosis of the spine. Hardware in the right hip. IMPRESSION: 1. Mild diffuse increased bowel gas throughout, question mild ileus. 2. Moderate stool in the colon including moderate retained feces at the rectum. Electronically Signed   By: Donavan Foil M.D.   On: 04/01/2021 15:54   CT Head Wo Contrast  Result Date: 03/18/2021 CLINICAL DATA:  Fall EXAM: CT HEAD WITHOUT CONTRAST CT CERVICAL SPINE WITHOUT CONTRAST TECHNIQUE: Multidetector CT imaging of the head and cervical spine was performed following the standard protocol without intravenous contrast. Multiplanar CT image reconstructions of the cervical spine were also generated. COMPARISON:  CT head 08/26/2020, cervical spine radiograph 05/13/2020, CT cervical spine 03/03/2007 FINDINGS: CT HEAD FINDINGS Brain: Mild streak artifact across the posterior fossa from dental amalgam. No evidence of acute infarction, hemorrhage, hydrocephalus, extra-axial  collection, visible mass lesion or mass effect. Symmetric prominence of the ventricles, cisterns and sulci compatible with parenchymal volume loss. Patchy areas of white matter hypoattenuation are most compatible with chronic microvascular angiopathy. Vascular: Atherosclerotic calcification of the carotid siphons and intradural vertebral arteries. No hyperdense vessel. Skull: No significant scalp swelling or hematoma. No calvarial fracture or focal osseous abnormality. Sinuses/Orbits: Paranasal sinuses and mastoid air cells are predominantly clear. Middle ear cavities are clear. Debris in the right external auditory canal. Orbital structures are unremarkable aside from prior lens extractions. Other: Bilateral TMJ arthrosis. CT CERVICAL SPINE FINDINGS Alignment: Cervical stabilization collar in place at the time exam. Preservation of the normal cervical lordosis. Levocurvature of the cervical spine with dextrocurvature of the imaged thoracic levels. Some of which may be positional. No evidence of traumatic listhesis. No abnormally widened, perched or jumped facets. Normal alignment of the craniocervical and atlantoaxial articulations accounting for mild rightward cranial rotation. Skull base and vertebrae: No acute skull base fracture. No vertebral body fracture or height loss. Normal bone mineralization. No worrisome osseous lesions. Arthrosis about the atlantodental and basion dens intervals. Additional multilevel cervical spondylitic changes, further detailed below. Soft tissues and spinal canal: No pre or paravertebral fluid or swelling. No visible canal hematoma. Airways patent. No concerning adenopathy. Calcifications proximal great vessels and aortic arch as well as the cervical carotids. Partial visualization a left chest wall pacer pack and subclavian leads. Disc levels: Multilevel intervertebral disc height loss with spondylitic endplate changes. Some multilevel disc osteophyte complexes partially efface  the ventral thecal sac with more pronounced findings at the C3-4, C4-5 level where this results in at least mild canal stenosis. Multilevel  uncinate spurring facet hypertrophic changes result in mild multilevel neural foraminal narrowing with more moderate narrowing at C3-4 as well. Upper chest: Progressive biapical pleuroparenchymal scarring and reticular interstitial changes, incompletely assessed on this exam. Consolidative process or convincing edema. No acute traumatic findings in the upper chest. Other: Normal thyroid. IMPRESSION: 1. No acute intracranial abnormality. No significant scalp swelling or hematoma. No calvarial fracture. 2. Background of microvascular angiopathy and diffuse parenchymal loss. 3. No acute fracture or traumatic listhesis of the cervical spine. 4. Possible cervical spondylitic changes; most pronounced about the atlantodental and basion dens interval as well as at the C3-4 level there is mild canal stenosis and moderate bilateral foraminal narrowing. 5. Progressive scarring and interstitial reticular changes in lung apices. Incompletely assessed on this exam. Could consider outpatient HRCT for further interrogation as warranted. 6. Intracranial, cervical carotid and aortic atherosclerosis. Electronically Signed   By: Lovena Le M.D.   On: 03/18/2021 21:47   CT Angio Chest PE W and/or Wo Contrast  Result Date: 03/29/2021 CLINICAL DATA:  Positive for right lower extremity DVT. Right hip surgery 1 month ago. EXAM: CT ANGIOGRAPHY CHEST WITH CONTRAST TECHNIQUE: Multidetector CT imaging of the chest was performed using the standard protocol during bolus administration of intravenous contrast. Multiplanar CT image reconstructions and MIPs were obtained to evaluate the vascular anatomy. CONTRAST:  126m OMNIPAQUE IOHEXOL 350 MG/ML SOLN COMPARISON:  10/15/2008 FINDINGS: Cardiovascular: Satisfactory opacification of the pulmonary arteries to the segmental level. Occlusive pulmonary embolus  involving a segmental branch of the left lower lobe. Stable cardiomegaly. No pericardial effusion. Thoracic aortic atherosclerosis. Multi vessel coronary artery atherosclerosis. Mediastinum/Nodes: No enlarged mediastinal, hilar, or axillary lymph nodes. Thyroid gland, trachea, and esophagus demonstrate no significant findings. Lungs/Pleura: Bilateral chronic interstitial lung disease. 7.4 mm right lower lobe bulla. Upper Abdomen: No acute upper abdominal abnormality. Musculoskeletal: No acute osseous abnormality. No aggressive osseous lesion. Mild thoracic spine spondylosis. Review of the MIP images confirms the above findings. IMPRESSION: 1. Occlusive pulmonary embolus involving a segmental branch of the left lower lobe. 2. Chronic interstitial lung disease. 3.  Aortic Atherosclerosis (ICD10-I70.0). 4. Multi vessel coronary artery atherosclerosis. Electronically Signed   By: HKathreen Devoid  On: 03/29/2021 15:44   CT Cervical Spine Wo Contrast  Result Date: 03/18/2021 CLINICAL DATA:  Fall EXAM: CT HEAD WITHOUT CONTRAST CT CERVICAL SPINE WITHOUT CONTRAST TECHNIQUE: Multidetector CT imaging of the head and cervical spine was performed following the standard protocol without intravenous contrast. Multiplanar CT image reconstructions of the cervical spine were also generated. COMPARISON:  CT head 08/26/2020, cervical spine radiograph 05/13/2020, CT cervical spine 03/03/2007 FINDINGS: CT HEAD FINDINGS Brain: Mild streak artifact across the posterior fossa from dental amalgam. No evidence of acute infarction, hemorrhage, hydrocephalus, extra-axial collection, visible mass lesion or mass effect. Symmetric prominence of the ventricles, cisterns and sulci compatible with parenchymal volume loss. Patchy areas of white matter hypoattenuation are most compatible with chronic microvascular angiopathy. Vascular: Atherosclerotic calcification of the carotid siphons and intradural vertebral arteries. No hyperdense vessel. Skull:  No significant scalp swelling or hematoma. No calvarial fracture or focal osseous abnormality. Sinuses/Orbits: Paranasal sinuses and mastoid air cells are predominantly clear. Middle ear cavities are clear. Debris in the right external auditory canal. Orbital structures are unremarkable aside from prior lens extractions. Other: Bilateral TMJ arthrosis. CT CERVICAL SPINE FINDINGS Alignment: Cervical stabilization collar in place at the time exam. Preservation of the normal cervical lordosis. Levocurvature of the cervical spine with dextrocurvature of the imaged thoracic levels.  Some of which may be positional. No evidence of traumatic listhesis. No abnormally widened, perched or jumped facets. Normal alignment of the craniocervical and atlantoaxial articulations accounting for mild rightward cranial rotation. Skull base and vertebrae: No acute skull base fracture. No vertebral body fracture or height loss. Normal bone mineralization. No worrisome osseous lesions. Arthrosis about the atlantodental and basion dens intervals. Additional multilevel cervical spondylitic changes, further detailed below. Soft tissues and spinal canal: No pre or paravertebral fluid or swelling. No visible canal hematoma. Airways patent. No concerning adenopathy. Calcifications proximal great vessels and aortic arch as well as the cervical carotids. Partial visualization a left chest wall pacer pack and subclavian leads. Disc levels: Multilevel intervertebral disc height loss with spondylitic endplate changes. Some multilevel disc osteophyte complexes partially efface the ventral thecal sac with more pronounced findings at the C3-4, C4-5 level where this results in at least mild canal stenosis. Multilevel uncinate spurring facet hypertrophic changes result in mild multilevel neural foraminal narrowing with more moderate narrowing at C3-4 as well. Upper chest: Progressive biapical pleuroparenchymal scarring and reticular interstitial changes,  incompletely assessed on this exam. Consolidative process or convincing edema. No acute traumatic findings in the upper chest. Other: Normal thyroid. IMPRESSION: 1. No acute intracranial abnormality. No significant scalp swelling or hematoma. No calvarial fracture. 2. Background of microvascular angiopathy and diffuse parenchymal loss. 3. No acute fracture or traumatic listhesis of the cervical spine. 4. Possible cervical spondylitic changes; most pronounced about the atlantodental and basion dens interval as well as at the C3-4 level there is mild canal stenosis and moderate bilateral foraminal narrowing. 5. Progressive scarring and interstitial reticular changes in lung apices. Incompletely assessed on this exam. Could consider outpatient HRCT for further interrogation as warranted. 6. Intracranial, cervical carotid and aortic atherosclerosis. Electronically Signed   By: Lovena Le M.D.   On: 03/18/2021 21:47   DG CHEST PORT 1 VIEW  Result Date: 04/06/2021 CLINICAL DATA:  Shortness of breath. EXAM: PORTABLE CHEST 1 VIEW COMPARISON:  04/04/2021.  08/26/2020.  CT 04/14/2021. FINDINGS: Cardiac pacer in stable position. Stable cardiomegaly. Chronic interstitial changes are again noted. Slight increase interstitial markings noted bilaterally suggesting superimposed active interstitial process including interstitial edema and/or pneumonitis. Tiny left pleural effusion cannot be excluded. No pneumothorax. IMPRESSION: 1.  Cardiac pacer stable position.  Stable cardiomegaly. 2. Chronic interstitial changes are again noted. Slight increase interstitial markings noted bilaterally suggesting superimposed active interstitial process including interstitial edema and/or pneumonitis. Tiny left pleural effusion cannot be excluded. Electronically Signed   By: Marcello Moores  Register   On: 04/06/2021 08:56   DG Chest Port 1 View  Result Date: 04/04/2021 CLINICAL DATA:  Shortness of breath.  Hypoxia EXAM: PORTABLE CHEST 1 VIEW  COMPARISON:  One-view chest x-ray 7/17 04/01/2021. Two-view chest x-ray 03/29/2021. FINDINGS: Heart size is exaggerated by low lung volumes. Atherosclerotic changes are present at the arch. Pacemaker leads are noted. Diffuse interstitial and airspace disease remains elevated over baseline. Findings remain concerning for edema or infection. Left pleural effusion suspected. IMPRESSION: 1. Stable appearance of diffuse interstitial and airspace disease concerning for edema or infection. 2. Probable left pleural effusion. Electronically Signed   By: San Morelle M.D.   On: 04/04/2021 08:41   DG CHEST PORT 1 VIEW  Result Date: 04/03/2021 CLINICAL DATA:  87 year old male with history of hypoxemia. Hypertension. EXAM: PORTABLE CHEST 1 VIEW COMPARISON:  Chest x-ray 04/01/2021. FINDINGS: Lung volumes are low. Patchy multifocal airspace consolidation widespread areas of interstitial prominence are again noted  throughout the lungs bilaterally. Emphysematous changes are also noted. Small left pleural effusion. No definite right pleural effusion. No pneumothorax. Cardiac silhouette is partially obscured, but heart size appears normal. Upper mediastinal contours are within normal limits. Atherosclerotic calcifications in the thoracic aorta. Left-sided pacemaker device in place with lead tips projecting over the expected location of the right atrium and right ventricle. IMPRESSION: 1. The appearance the chest is very similar to recent prior studies, favored to reflect severe multilobar bilateral pneumonia (left greater than right). 2. Small left pleural effusion. 3. Aortic atherosclerosis. Electronically Signed   By: Vinnie Langton M.D.   On: 04/03/2021 08:40   DG CHEST PORT 1 VIEW  Result Date: 04/01/2021 CLINICAL DATA:  Short of breath, hypoxemia EXAM: PORTABLE CHEST 1 VIEW COMPARISON:  03/31/2021 FINDINGS: Single frontal view of the chest demonstrates stable dual lead pacer. Cardiac silhouette is unchanged.  There has been progression of the interstitial and ground-glass opacity seen previously, with relative sparing of the right upper lung zone. Trace left pleural effusion unchanged. No pneumothorax. IMPRESSION: 1. Progressive interstitial and ground-glass opacities consistent with worsening edema or infection. 2. Stable trace left pleural effusion. Electronically Signed   By: Randa Ngo M.D.   On: 04/01/2021 17:46   DG Chest Port 1 View  Result Date: 03/31/2021 CLINICAL DATA:  Acute respiratory failure with hypoxia EXAM: PORTABLE CHEST 1 VIEW COMPARISON:  03/21/2021 FINDINGS: Cardiac enlargement. Dual lead pacemaker. Atherosclerotic calcification aorta. COPD with pulmonary fibrosis in the bases. There is progression of bibasilar airspace disease which may be due to superimposed infiltrate or edema. Small left effusion. IMPRESSION: Progressive bibasilar airspace disease on background fibrosis. Possible edema or pneumonia. Electronically Signed   By: Franchot Gallo M.D.   On: 03/31/2021 13:42   DG C-Arm 1-60 Min  Result Date: 03/19/2021 CLINICAL DATA:  RIGHT IM nail. EXAM: RIGHT FEMUR 2 VIEWS; DG C-ARM 1-60 MIN COMPARISON:  03/18/2021 FINDINGS: Five images demonstrate proximal and distal aspects of intramedullary nail and lag screw across intertrochanteric fracture of the RIGHT hip no interval fractures. Hip is located. IMPRESSION: IM nail of the RIGHT femur. Electronically Signed   By: Nolon Nations M.D.   On: 03/19/2021 12:13   ECHOCARDIOGRAM COMPLETE  Result Date: 03/31/2021    ECHOCARDIOGRAM REPORT   Patient Name:   [name] Badour Date of Exam: 03/31/2021 Medical Rec #:  [id number]     Height:       67.0 in Accession #:    [id number]    Weight:       174.2 lb Date of Birth:  1930/10/05      BSA:          1.907 m Patient Age:    15 years      BP:           104/57 mmHg Patient Gender: M             HR:           71 bpm. Exam Location:  Inpatient Procedure: 2D Echo, Cardiac Doppler, Color Doppler and  Intracardiac            Opacification Agent Indications:    Pulmonary Embolus I26.09  History:        Patient has prior history of Echocardiogram examinations, most                 recent 12/22/2019. CAD; Risk Factors:Dyslipidemia and  Hypertension.  Sonographer:    Bernadene Person RDCS Referring Phys: [id number] [place]  1. Left ventricular ejection fraction, by estimation, is 55 to 60%. The left ventricle has normal function. The left ventricle has no regional wall motion abnormalities. Left ventricular diastolic parameters are consistent with Grade II diastolic dysfunction (pseudonormalization).  2. Peak RV-RA gradient 60.5 mmHg. The IVC was poorly visualized. Right ventricular systolic function is mildly reduced. The right ventricular size is mildly enlarged.  3. Left atrial size was mildly dilated.  4. The mitral valve is normal in structure. Moderate mitral valve regurgitation. No evidence of mitral stenosis.  5. The aortic valve is tricuspid. Aortic valve regurgitation is mild. Mild aortic valve sclerosis is present, with no evidence of aortic valve stenosis. FINDINGS  Left Ventricle: Left ventricular ejection fraction, by estimation, is 55 to 60%. The left ventricle has normal function. The left ventricle has no regional wall motion abnormalities. Definity contrast agent was given IV to delineate the left ventricular  endocardial borders. The left ventricular internal cavity size was normal in size. There is no left ventricular hypertrophy. Left ventricular diastolic parameters are consistent with Grade II diastolic dysfunction (pseudonormalization). Right Ventricle: Peak RV-RA gradient 60.5 mmHg. The IVC was poorly visualized. The right ventricular size is mildly enlarged. No increase in right ventricular wall thickness. Right ventricular systolic function is mildly reduced. Left Atrium: Left atrial size was mildly dilated. Right Atrium: Right atrial size was normal in size.  Pericardium: There is no evidence of pericardial effusion. Mitral Valve: The mitral valve is normal in structure. There is mild calcification of the mitral valve leaflet(s). Mild mitral annular calcification. Moderate mitral valve regurgitation. No evidence of mitral valve stenosis. Tricuspid Valve: The tricuspid valve is normal in structure. Tricuspid valve regurgitation is mild. Aortic Valve: The aortic valve is tricuspid. Aortic valve regurgitation is mild. Aortic regurgitation PHT measures 428 msec. Mild aortic valve sclerosis is present, with no evidence of aortic valve stenosis. Pulmonic Valve: The pulmonic valve was normal in structure. Pulmonic valve regurgitation is trivial. Aorta: The aortic root is normal in size and structure. Venous: The inferior vena cava was not well visualized. IAS/Shunts: No atrial level shunt detected by color flow Doppler.  LEFT VENTRICLE PLAX 2D LVIDd:         3.90 cm  Diastology LVIDs:         2.30 cm  LV e' medial:    8.41 cm/s LV PW:         1.10 cm  LV E/e' medial:  12.1 LV IVS:        1.00 cm  LV e' lateral:   9.37 cm/s LVOT diam:     2.10 cm  LV E/e' lateral: 10.9 LV SV:         77 LV SV Index:   40 LVOT Area:     3.46 cm  RIGHT VENTRICLE RV S prime:     9.37 cm/s TAPSE (M-mode): 2.4 cm LEFT ATRIUM           Index       RIGHT ATRIUM           Index LA diam:      4.20 cm 2.20 cm/m  RA Area:     15.40 cm LA Vol (A2C): 52.2 ml 27.38 ml/m RA Volume:   34.60 ml  18.15 ml/m LA Vol (A4C): 54.4 ml 28.53 ml/m  AORTIC VALVE LVOT Vmax:   92.70 cm/s LVOT Vmean:  67.900 cm/s  LVOT VTI:    0.221 m AI PHT:      428 msec  AORTA Ao Root diam: 3.30 cm Ao Asc diam:  3.40 cm MITRAL VALVE                TRICUSPID VALVE MV Area (PHT): 5.27 cm     TR Peak grad:   60.5 mmHg MV Decel Time: 144 msec     TR Vmax:        389.00 cm/s MR PISA:        1.01 cm MR PISA Radius: 0.40 cm     SHUNTS MV E velocity: 102.00 cm/s  Systemic VTI:  0.22 m MV A velocity: 55.30 cm/s   Systemic Diam: 2.10 cm MV  E/A ratio:  1.84 Loralie Champagne MD Electronically signed by Loralie Champagne MD Signature Date/Time: 03/31/2021/4:20:40 PM    Final    DG FEMUR, MIN 2 VIEWS RIGHT  Result Date: 03/19/2021 CLINICAL DATA:  RIGHT IM nail. EXAM: RIGHT FEMUR 2 VIEWS; DG C-ARM 1-60 MIN COMPARISON:  03/18/2021 FINDINGS: Five images demonstrate proximal and distal aspects of intramedullary nail and lag screw across intertrochanteric fracture of the RIGHT hip no interval fractures. Hip is located. IMPRESSION: IM nail of the RIGHT femur. Electronically Signed   By: Nolon Nations M.D.   On: 03/19/2021 12:13   DG FEMUR, MIN 2 VIEWS RIGHT  Result Date: 03/18/2021 CLINICAL DATA:  Fall with femur deformity EXAM: RIGHT FEMUR 2 VIEWS COMPARISON:  None. FINDINGS: Acute comminuted and displaced right intertrochanteric femur fracture without femoral head dislocation. Vascular calcifications. Mid to distal femur appears intact IMPRESSION: Acute comminuted and displaced right intertrochanteric fracture Electronically Signed   By: Donavan Foil M.D.   On: 03/18/2021 22:01    Microbiology Recent Results (from the past 240 hour(s))  Culture, blood (Routine x 2)     Status: Abnormal   Collection Time: 04/10/2021 12:55 PM   Specimen: BLOOD  Result Value Ref Range Status   Specimen Description BLOOD RIGHT ANTECUBITAL  Final   Special Requests   Final    BOTTLES DRAWN AEROBIC AND ANAEROBIC Blood Culture results may not be optimal due to an excessive volume of blood received in culture bottles   Culture  Setup Time   Final    GRAM NEGATIVE RODS AEROBIC BOTTLE ONLY CRITICAL RESULT CALLED TO, READ BACK BY AND VERIFIED WITHAndres Shad PHARMD 1327 03/31/21 A BROWNING Performed at [facility], [place] [address] [place], [place] [address]    Culture ENTEROBACTER CLOACAE (A)  Final   Report Status 04/02/2021 FINAL  Final   Organism ID, Bacteria ENTEROBACTER CLOACAE  Final      Susceptibility   Enterobacter cloacae - MIC*    CEFAZOLIN >=64  RESISTANT Resistant     CEFEPIME <=0.12 SENSITIVE Sensitive     CEFTAZIDIME <=1 SENSITIVE Sensitive     CIPROFLOXACIN <=0.25 SENSITIVE Sensitive     GENTAMICIN <=1 SENSITIVE Sensitive     IMIPENEM 1 SENSITIVE Sensitive     TRIMETH/SULFA >=320 RESISTANT Resistant     PIP/TAZO <=4 SENSITIVE Sensitive     * ENTEROBACTER CLOACAE  Blood Culture ID Panel (Reflexed)     Status: Abnormal   Collection Time: 04/16/2021 12:55 PM  Result Value Ref Range Status   Enterococcus faecalis NOT DETECTED NOT DETECTED Final   Enterococcus Faecium NOT DETECTED NOT DETECTED Final   Listeria monocytogenes NOT DETECTED NOT DETECTED Final   Staphylococcus species NOT DETECTED NOT DETECTED Final  Staphylococcus aureus (BCID) NOT DETECTED NOT DETECTED Final   Staphylococcus epidermidis NOT DETECTED NOT DETECTED Final   Staphylococcus lugdunensis NOT DETECTED NOT DETECTED Final   Streptococcus species NOT DETECTED NOT DETECTED Final   Streptococcus agalactiae NOT DETECTED NOT DETECTED Final   Streptococcus pneumoniae NOT DETECTED NOT DETECTED Final   Streptococcus pyogenes NOT DETECTED NOT DETECTED Final   A.calcoaceticus-baumannii NOT DETECTED NOT DETECTED Final   Bacteroides fragilis NOT DETECTED NOT DETECTED Final   Enterobacterales DETECTED (A) NOT DETECTED Final    Comment: Enterobacterales represent a large order of gram negative bacteria, not a single organism.   Enterobacter cloacae complex DETECTED (A) NOT DETECTED Final    Comment: CRITICAL RESULT CALLED TO, READ BACK BY AND VERIFIED WITH: Andres Shad PHARMD 1327 03/31/21 A BROWNING    Escherichia coli NOT DETECTED NOT DETECTED Final   Klebsiella aerogenes NOT DETECTED NOT DETECTED Final   Klebsiella oxytoca NOT DETECTED NOT DETECTED Final   Klebsiella pneumoniae NOT DETECTED NOT DETECTED Final   Proteus species NOT DETECTED NOT DETECTED Final   Salmonella species NOT DETECTED NOT DETECTED Final   Serratia marcescens NOT DETECTED NOT DETECTED Final    Haemophilus influenzae NOT DETECTED NOT DETECTED Final   Neisseria meningitidis NOT DETECTED NOT DETECTED Final   Pseudomonas aeruginosa NOT DETECTED NOT DETECTED Final   Stenotrophomonas maltophilia NOT DETECTED NOT DETECTED Final   Candida albicans NOT DETECTED NOT DETECTED Final   Candida auris NOT DETECTED NOT DETECTED Final   Candida glabrata NOT DETECTED NOT DETECTED Final   Candida krusei NOT DETECTED NOT DETECTED Final   Candida parapsilosis NOT DETECTED NOT DETECTED Final   Candida tropicalis NOT DETECTED NOT DETECTED Final   Cryptococcus neoformans/gattii NOT DETECTED NOT DETECTED Final   CTX-M ESBL NOT DETECTED NOT DETECTED Final   Carbapenem resistance IMP NOT DETECTED NOT DETECTED Final   Carbapenem resistance KPC NOT DETECTED NOT DETECTED Final   Carbapenem resistance NDM NOT DETECTED NOT DETECTED Final   Carbapenem resist OXA 48 LIKE NOT DETECTED NOT DETECTED Final   Carbapenem resistance VIM NOT DETECTED NOT DETECTED Final    Comment: Performed at [facility], [address] [place], [place] [address]  Resp Panel by RT-PCR (Flu A&B, Covid) Nasopharyngeal Swab     Status: None   Collection Time: 04/06/2021  1:00 PM   Specimen: Nasopharyngeal Swab; Nasopharyngeal(NP) swabs in vial transport medium  Result Value Ref Range Status   SARS Coronavirus 2 by RT PCR NEGATIVE NEGATIVE Final    Comment: (NOTE) SARS-CoV-2 target nucleic acids are NOT DETECTED.  The SARS-CoV-2 RNA is generally detectable in upper respiratory specimens during the acute phase of infection. The lowest concentration of SARS-CoV-2 viral copies this assay can detect is 138 copies/mL. A negative result does not preclude SARS-Cov-2 infection and should not be used as the sole basis for treatment or other patient management decisions. A negative result may occur with  improper specimen collection/handling, submission of specimen other than nasopharyngeal swab, presence of viral mutation(s) within  the areas targeted by this assay, and inadequate number of viral copies(<138 copies/mL). A negative result must be combined with clinical observations, patient history, and epidemiological information. The expected result is Negative.  Fact Sheet for Patients:  EntrepreneurPulse.com.au  Fact Sheet for Healthcare Providers:  IncredibleEmployment.be  This test is no t yet approved or cleared by the [place] FDA and  has been authorized for detection and/or diagnosis of SARS-CoV-2 by FDA under an Emergency Use Authorization (EUA).  This EUA will remain  in effect (meaning this test can be used) for the duration of the COVID-19 declaration under Section 564(b)(1) of the Act, 21 U.S.C.section 360bbb-3(b)(1), unless the authorization is terminated  or revoked sooner.       Influenza A by PCR NEGATIVE NEGATIVE Final   Influenza B by PCR NEGATIVE NEGATIVE Final    Comment: (NOTE) The Xpert Xpress SARS-CoV-2/FLU/RSV plus assay is intended as an aid in the diagnosis of influenza from Nasopharyngeal swab specimens and should not be used as a sole basis for treatment. Nasal washings and aspirates are unacceptable for Xpert Xpress SARS-CoV-2/FLU/RSV testing.  Fact Sheet for Patients: EntrepreneurPulse.com.au  Fact Sheet for Healthcare Providers: IncredibleEmployment.be  This test is not yet approved or cleared by the [place] FDA and has been authorized for detection and/or diagnosis of SARS-CoV-2 by FDA under an Emergency Use Authorization (EUA). This EUA will remain in effect (meaning this test can be used) for the duration of the COVID-19 declaration under Section 564(b)(1) of the Act, 21 U.S.C. section 360bbb-3(b)(1), unless the authorization is terminated or revoked.  Performed at [facility], [place] [address], [place], [place] [address]   Surgical pcr screen     Status: None   Collection  Time: 04/04/2021  5:23 PM   Specimen: Nasal Mucosa; Nasal Swab  Result Value Ref Range Status   MRSA, PCR NEGATIVE NEGATIVE Final   Staphylococcus aureus NEGATIVE NEGATIVE Final    Comment: (NOTE) The Xpert SA Assay (FDA approved for NASAL specimens in patients 65 years of age and older), is one component of a comprehensive surveillance program. It is not intended to diagnose infection nor to guide or monitor treatment. Performed at [facility], [place] [address], [place], [place] [address]   Culture, blood (Routine x 2)     Status: None   Collection Time: 04/05/2021 10:53 PM   Specimen: BLOOD  Result Value Ref Range Status   Specimen Description BLOOD LEFT ANTECUBITAL  Final   Special Requests   Final    BOTTLES DRAWN AEROBIC ONLY Blood Culture adequate volume   Culture   Final    NO GROWTH 5 DAYS Performed at [facility], [place] [address] [place], [place] [address]    Report Status 04/04/2021 FINAL  Final  Urine Culture     Status: None   Collection Time: 03/31/21  6:33 AM   Specimen: Urine, Random  Result Value Ref Range Status   Specimen Description URINE, RANDOM  Final   Special Requests NONE  Final   Culture   Final    NO GROWTH Performed at [facility], [place] [address], [place], [place] [address]    Report Status 04/02/2021 FINAL  Final  Culture, blood (routine x 2)     Status: None   Collection Time: 04/02/21  2:12 PM   Specimen: BLOOD  Result Value Ref Range Status   Specimen Description BLOOD BLOOD RIGHT HAND  Final   Special Requests   Final    AEROBIC BOTTLE ONLY Blood Culture results may not be optimal due to an inadequate volume of blood received in culture bottles   Culture   Final    NO GROWTH 5 DAYS Performed at [facility], [place] [address] [place], [place] [address]    Report Status 04/07/2021 FINAL  Final  Culture, blood (routine x 2)     Status: None   Collection Time: 04/02/21  2:21 PM  Specimen: BLOOD  Result Value Ref Range  Status   Specimen Description BLOOD BLOOD RIGHT HAND  Final   Special Requests   Final    AEROBIC BOTTLE ONLY Blood Culture results may not be optimal due to an inadequate volume of blood received in culture bottles   Culture   Final    NO GROWTH 5 DAYS Performed at [facility], [place] [address], [place], [place] [address]    Report Status 04/07/2021 FINAL  Final  Resp Panel by RT-PCR (Flu A&B, Covid) Nasopharyngeal Swab     Status: None   Collection Time: 04/05/21  6:34 PM   Specimen: Nasopharyngeal Swab; Nasopharyngeal(NP) swabs in vial transport medium  Result Value Ref Range Status   SARS Coronavirus 2 by RT PCR NEGATIVE NEGATIVE Final    Comment: (NOTE) SARS-CoV-2 target nucleic acids are NOT DETECTED.  The SARS-CoV-2 RNA is generally detectable in upper respiratory specimens during the acute phase of infection. The lowest concentration of SARS-CoV-2 viral copies this assay can detect is 138 copies/mL. A negative result does not preclude SARS-Cov-2 infection and should not be used as the sole basis for treatment or other patient management decisions. A negative result may occur with  improper specimen collection/handling, submission of specimen other than nasopharyngeal swab, presence of viral mutation(s) within the areas targeted by this assay, and inadequate number of viral copies(<138 copies/mL). A negative result must be combined with clinical observations, patient history, and epidemiological information. The expected result is Negative.  Fact Sheet for Patients:  EntrepreneurPulse.com.au  Fact Sheet for Healthcare Providers:  IncredibleEmployment.be  This test is no t yet approved or cleared by the [place] FDA and  has been authorized for detection and/or diagnosis of SARS-CoV-2 by FDA under an Emergency Use Authorization (EUA). This EUA will remain  in effect (meaning this test can be used) for the duration of  the COVID-19 declaration under Section 564(b)(1) of the Act, 21 U.S.C.section 360bbb-3(b)(1), unless the authorization is terminated  or revoked sooner.       Influenza A by PCR NEGATIVE NEGATIVE Final   Influenza B by PCR NEGATIVE NEGATIVE Final    Comment: (NOTE) The Xpert Xpress SARS-CoV-2/FLU/RSV plus assay is intended as an aid in the diagnosis of influenza from Nasopharyngeal swab specimens and should not be used as a sole basis for treatment. Nasal washings and aspirates are unacceptable for Xpert Xpress SARS-CoV-2/FLU/RSV testing.  Fact Sheet for Patients: EntrepreneurPulse.com.au  Fact Sheet for Healthcare Providers: IncredibleEmployment.be  This test is not yet approved or cleared by the [place] FDA and has been authorized for detection and/or diagnosis of SARS-CoV-2 by FDA under an Emergency Use Authorization (EUA). This EUA will remain in effect (meaning this test can be used) for the duration of the COVID-19 declaration under Section 564(b)(1) of the Act, 21 U.S.C. section 360bbb-3(b)(1), unless the authorization is terminated or revoked.  Performed at [facility], [place] [address] [place], [place] [address]     Lab Basic Metabolic Panel: Recent Labs  Lab 04/03/21 0032 04/04/21 0020 04/05/21 0102 04/06/21 0618 04/07/21 0443  NA 135 136 134* 130* 126*  K 3.5 3.7 4.4 5.5* 4.4  CL 101 101 104 102 98  CO2 '24 25 23 '$ 16* 21*  GLUCOSE 135* 122* 88 93 91  BUN 18 25* 29* 29* 25*  CREATININE 0.90 1.00 0.93 1.15 1.29*  CALCIUM 8.6* 8.9 8.4* 8.7* 8.5*  PHOS  --   --   --   --  2.7   Liver Function Tests: Recent Labs  Lab 04/07/21 0443  ALBUMIN 2.9*   No results for input(s): LIPASE, AMYLASE in the last 168 hours. No results for input(s): AMMONIA in the last 168 hours. CBC: Recent Labs  Lab 04/03/21 0032 04/04/21 0020 04/05/21 0102 04/06/21 0618 04/07/21 0443  WBC 14.9* 14.9* 11.4* 15.9* 13.9*  HGB  9.5* 9.9* 9.3* 9.8* 7.5*  HCT 30.0* 31.8* 30.5* 31.7* 23.6*  MCV 96.2 98.1 100.0 99.1 96.7  PLT 207 226 208 155 139*   Cardiac Enzymes: No results for input(s): CKTOTAL, CKMB, CKMBINDEX, TROPONINI in the last 168 hours. Sepsis Labs: Recent Labs  Lab 04/03/21 0032 04/03/21 1230 04/04/21 0020 04/05/21 0102 04/06/21 0618 04/07/21 0443  PROCALCITON <0.10  --  0.12 0.16  --   --   WBC 14.9*  --  14.9* 11.4* 15.9* 13.9*  LATICACIDVEN  --  1.9 2.1*  --   --   --     Procedures/Operations     Cordelia Poche 2021-04-18, 4:42 PM

## 2021-04-18 NOTE — Progress Notes (Signed)
Patient ID: SUAVE MCKITTRICK, male   DOB: 1931-02-05, 87 y.o.   MRN: [id number]    Progress Note from the Palliative Medicine Team at [facility]   Patient Name: [name]        Date: 2021-04-24 DOB: 10-27-1930  Age: 87 y.o. MRN#: [id number] Attending Physician: Mariel Aloe, MD Primary Care Physician: Shirline Frees, MD Admit Date: 04/14/2021   Medical records reviewed.  [name] is a 87 y.o. male with a past medical history significant for dementia, HTN, sick sinus syndrome with dual chamber pacer, solitary kidney, dyslipidemia, myasthenia gravis on chronic steroids, CAD, BPH with chronic foley, and ORIF of right intertrochanteric femur fracture on 03/19/21 with discharge on 03/23/21 to [facility]. A few days post discharge pt developed right leg swelling and pain. [place] on 03/29/21 found right leg DVT and pt was started on Xarelto. Pt presented to ED on 04/16/2021 in respiratory distress and was hypotensive.  Palliative team received call from patient's RN that daughter reported she was ready to free patient from HFNC.  Went to bedside to speak with daughter Shirlean Mylar - she confirms desire to free him from [place]. We discussed initiating morphine infusion to ensure his comfort through process. She expresses understanding. We discussed anticipation of death likely today. She expressed understanding. All questions and concerns addressed.   Spoke with nursing staff about plan to utilize morphine infusion for comfort as we titrate HFNC to off.   Plan of care: -titrate HFNC off, initiate morphine infusion to ensure comfort during process - anticipate hospital death hours-days -DNR/DNI -No artificial feeding or hydration now or in the future -No further diagnostics -Discontinue unnecessary medications; antibiotics, steroids   Total time: 30 minutes  Greater than 50%  of this time was spent counseling and coordinating care related to the above assessment and plan.  Juel Burrow, DNP,  AGNP-C Palliative Medicine Team Team Phone # [phone]  Pager # [phone]

## 2021-04-18 DEATH — deceased

## 2022-11-28 IMAGING — DX DG FEMUR 2+V*R*
4 series · 4 of 4 positions shown · non-contrast
Comparison: None.

CLINICAL DATA: Fall with femur deformity

EXAM:
RIGHT FEMUR 2 VIEWS

[femur ap]
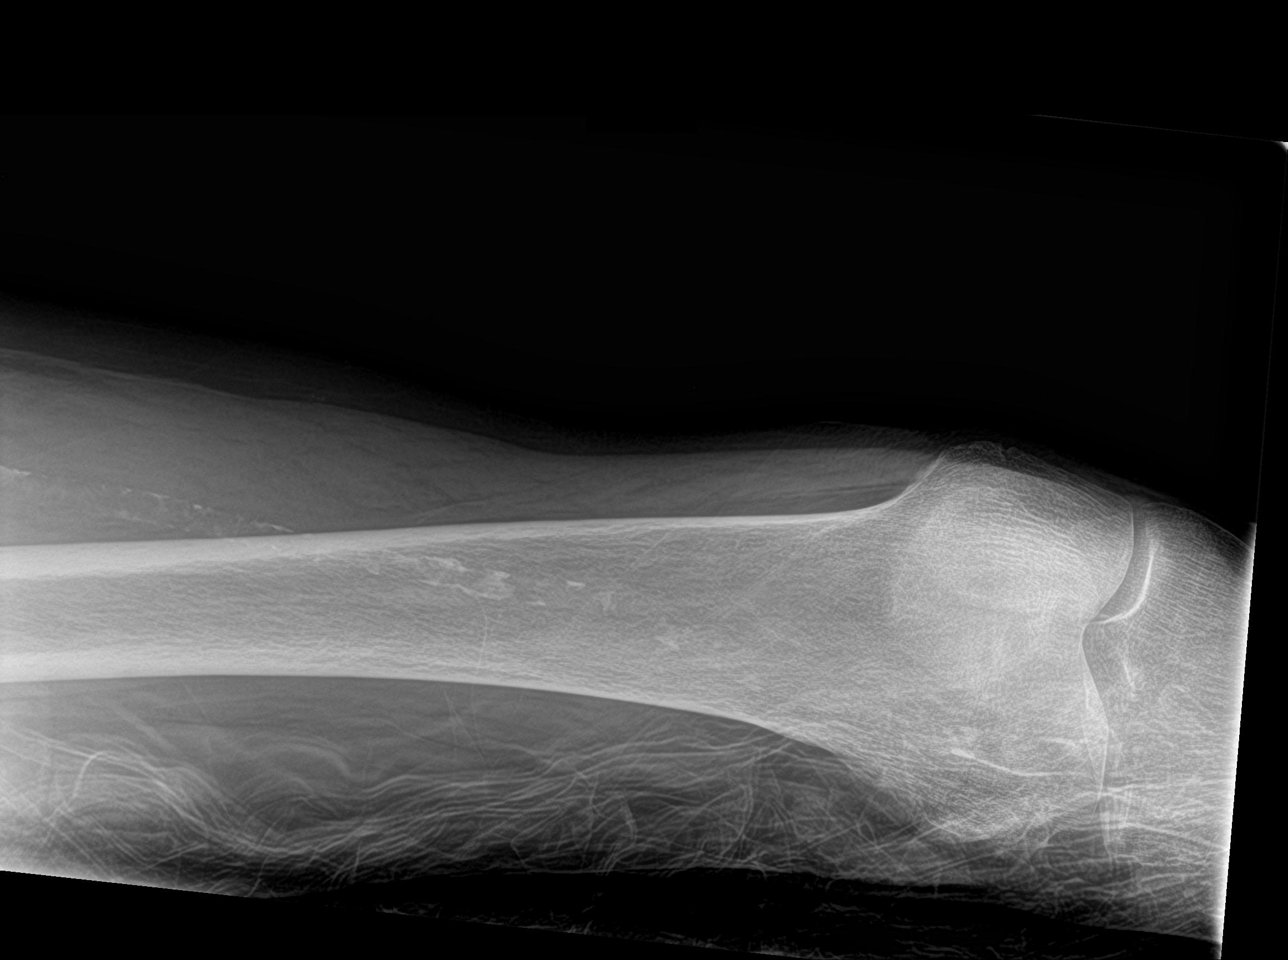

[femur lat (1 of 3)]
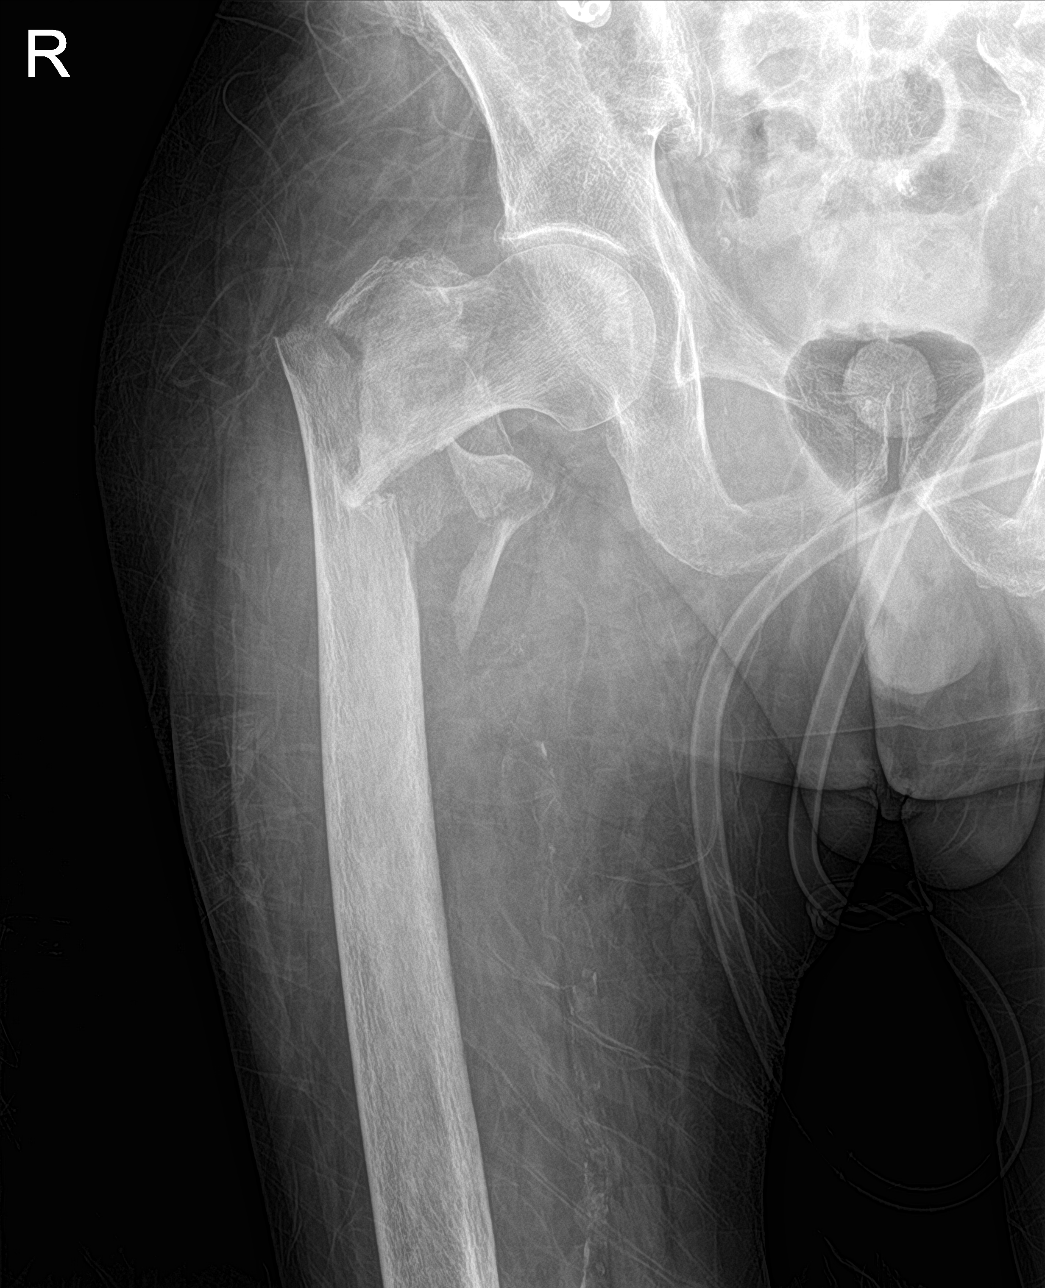

[femur lat (2 of 3)]
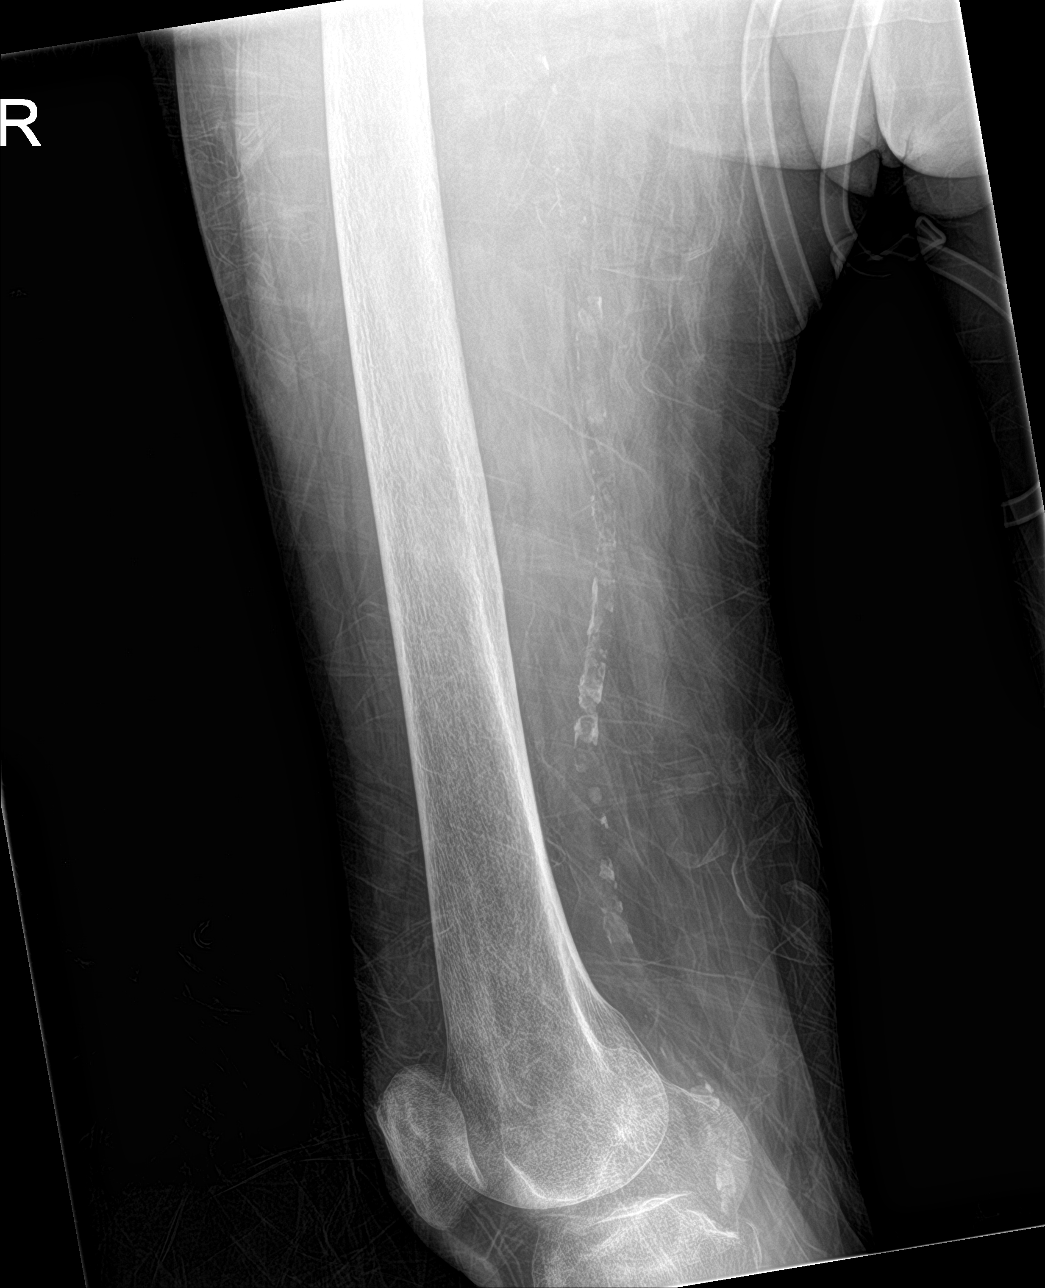

[femur lat (3 of 3)]
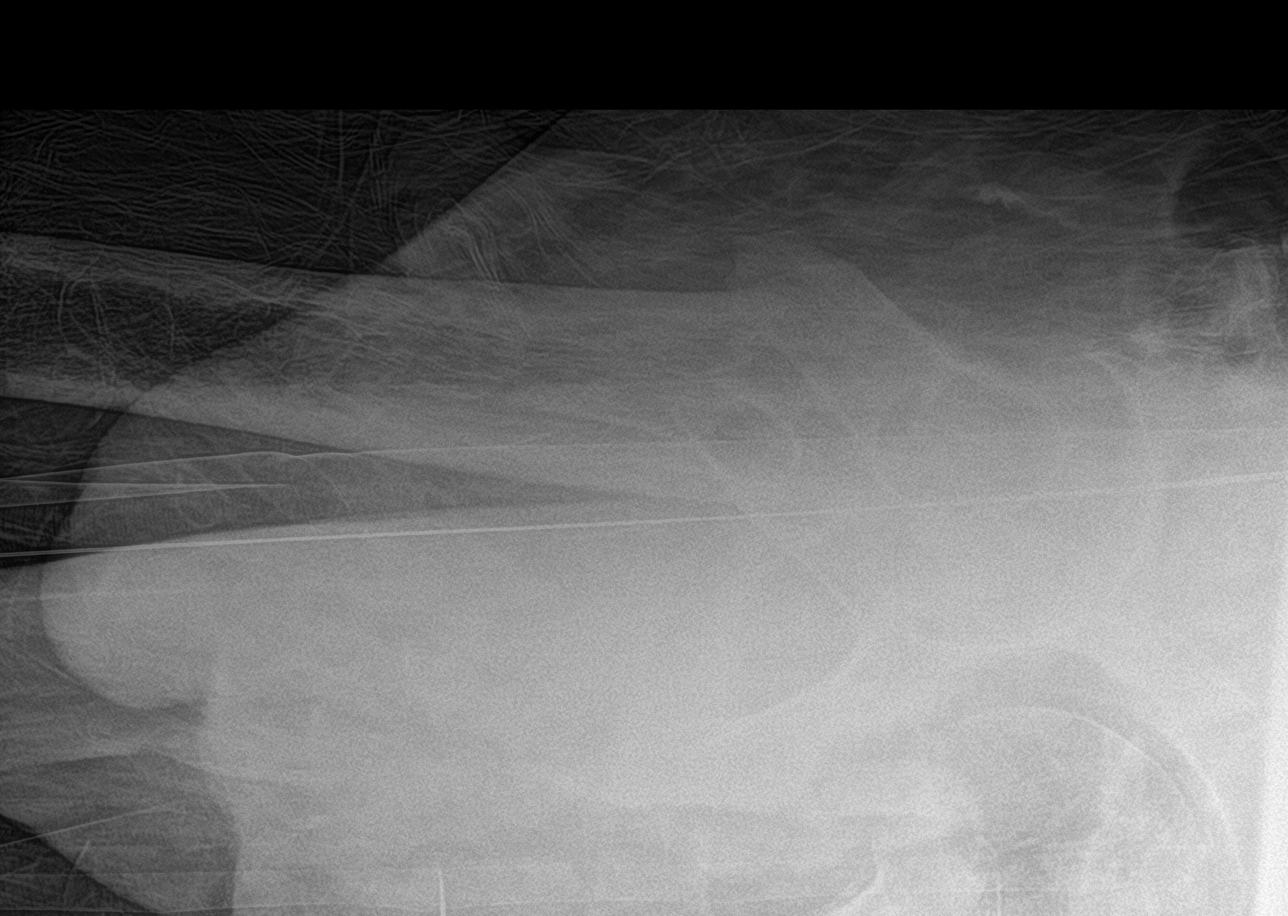

[4 of 4 positions shown; findings below may reference images not displayed]

FINDINGS: Acute comminuted and displaced right intertrochanteric femur
fracture without femoral head dislocation. Vascular calcifications.
Mid to distal femur appears intact
IMPRESSION: Acute comminuted and displaced right intertrochanteric fracture

## 2022-11-28 IMAGING — CT CT CERVICAL SPINE W/O CM
3 of 4 series · 9 of 33 positions shown, 11 images · non-contrast
Comparison: CT head 08/26/2020, cervical spine radiograph
05/13/2020, CT cervical spine 03/03/2007

CLINICAL DATA: Fall

EXAM:
CT HEAD WITHOUT CONTRAST
CT CERVICAL SPINE WITHOUT CONTRAST
TECHNIQUE: Multidetector CT imaging of the head and cervical spine was
performed following the standard protocol without intravenous
contrast. Multiplanar CT image reconstructions of the cervical spine
were also generated.

[Series 5: c_spine 2.0 st · axial · 0.40mm/px · z∈[-234,-234]mm · 1 of 123 slices shown, 2 images]
[im 62/123  soft-tissue]
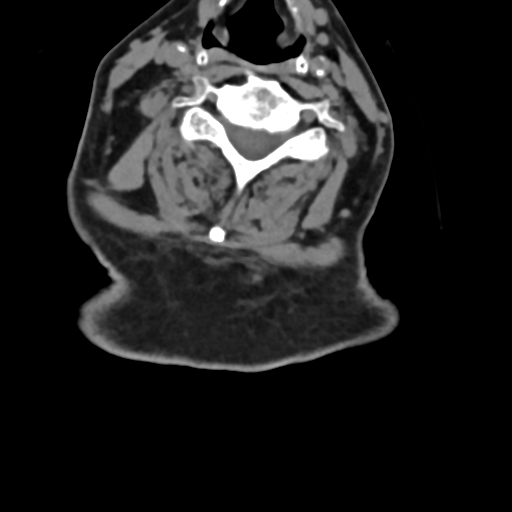
[im 62/123  bone]
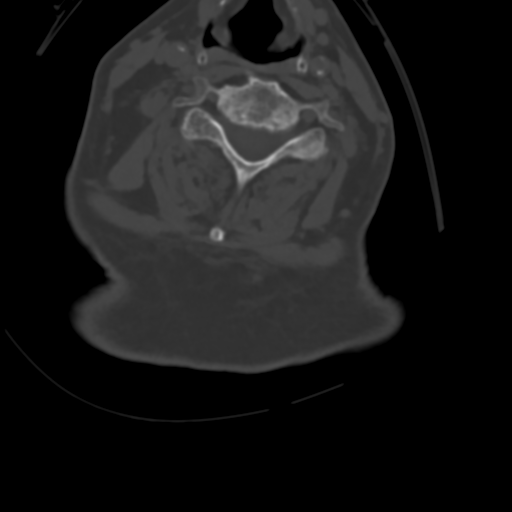

[Series 6: coronal bone · coronal · 0.28mm/px · 3 of 72 slices shown]
[im 15/72  bone]
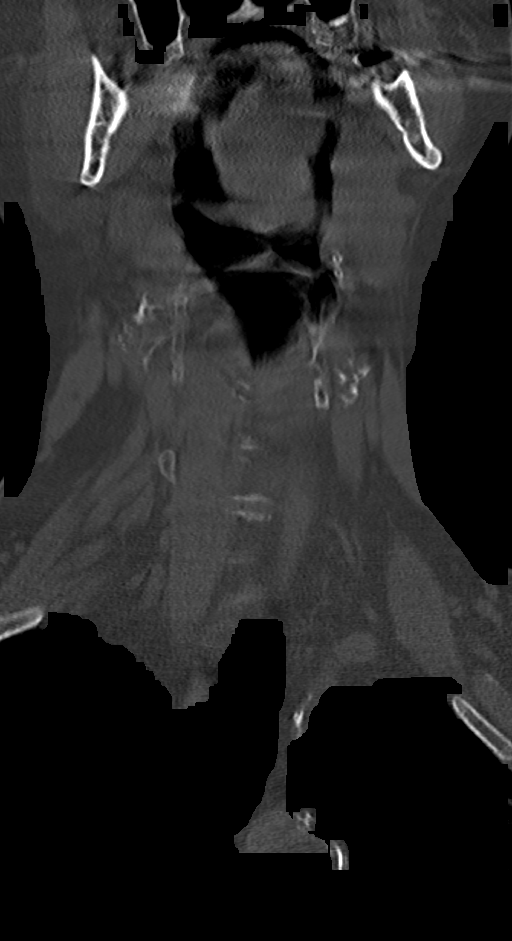
[im 29/72  bone]
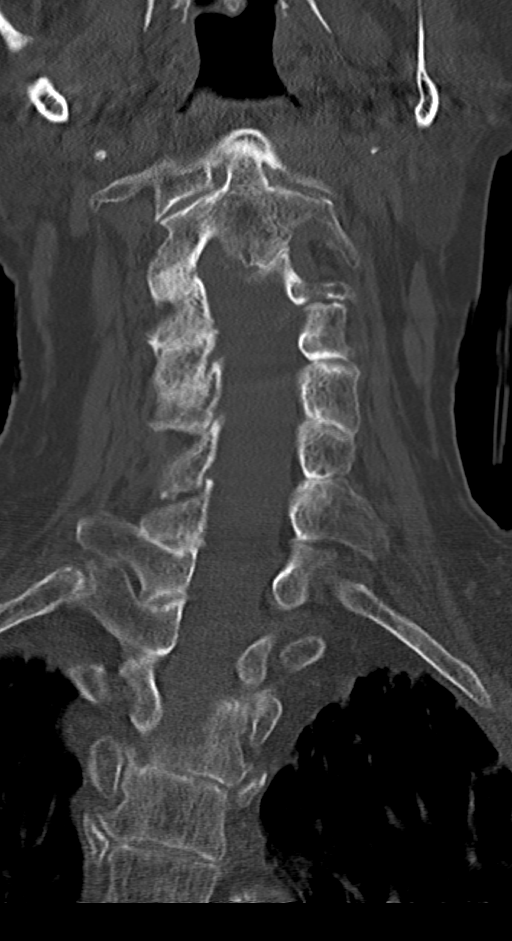
[im 43/72  bone]
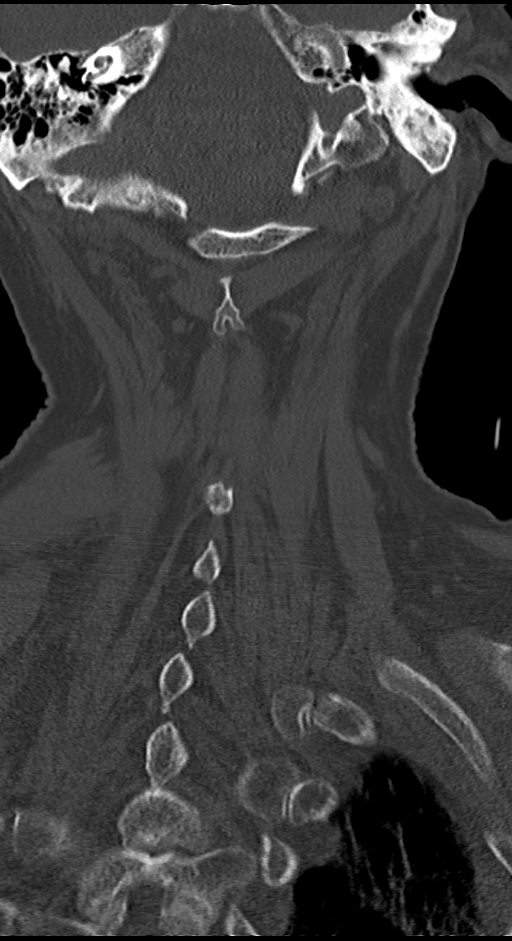

[Series 7: sagittal bone · sagittal · 0.32mm/px · 5 of 69 slices shown, 6 images]
[im 23/69  bone]
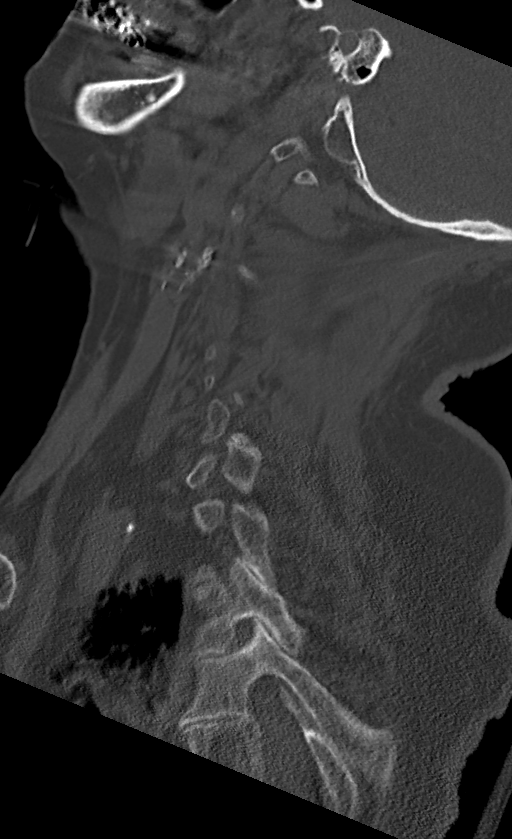
[im 29/69  bone]
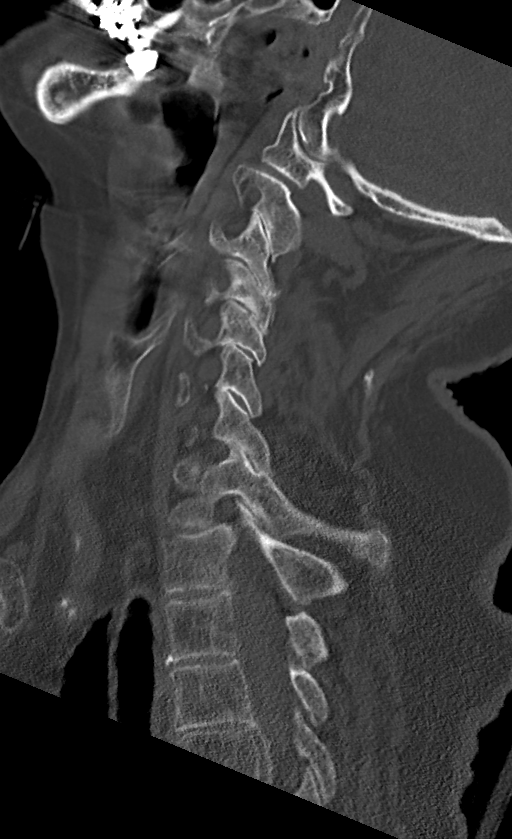
[im 35/69  soft-tissue]
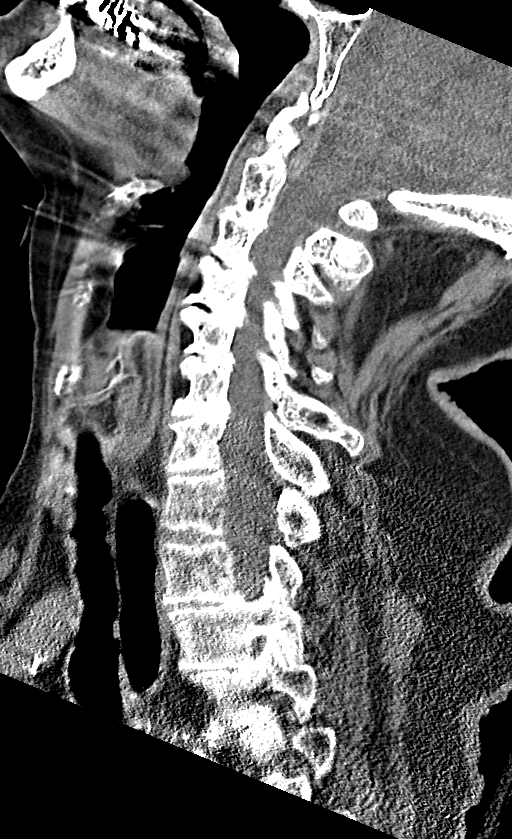
[im 35/69  bone]
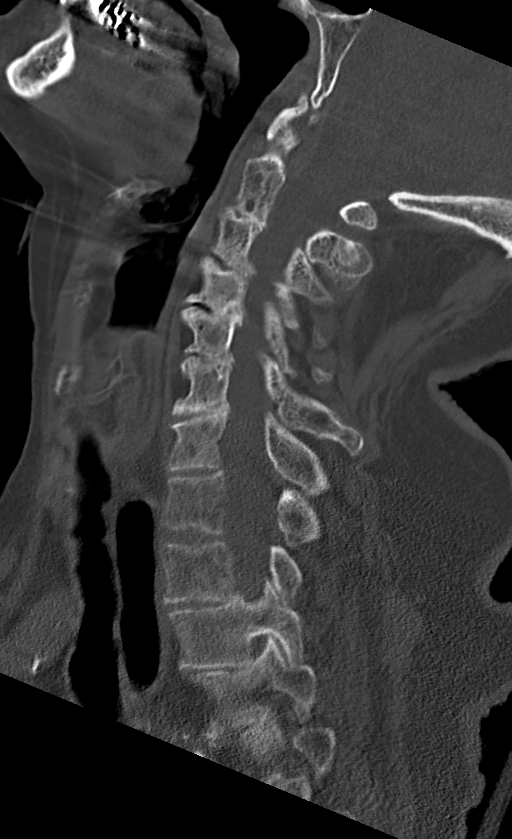
[im 40/69  bone]
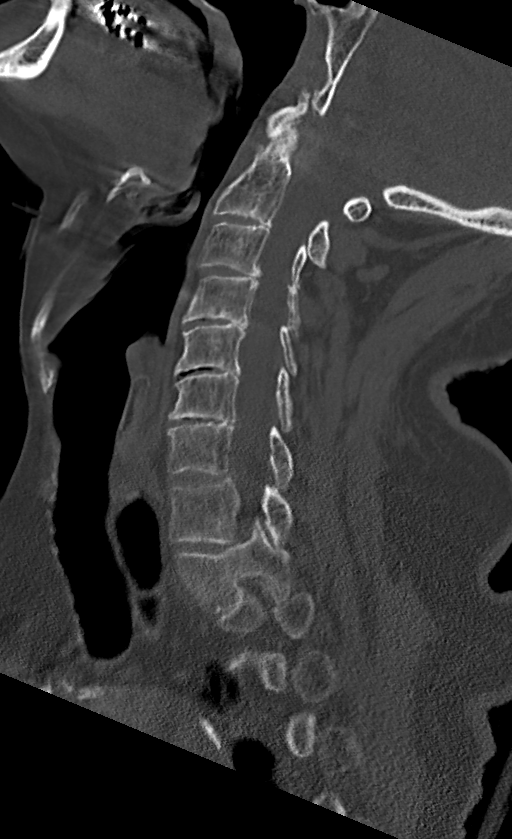
[im 46/69  bone]
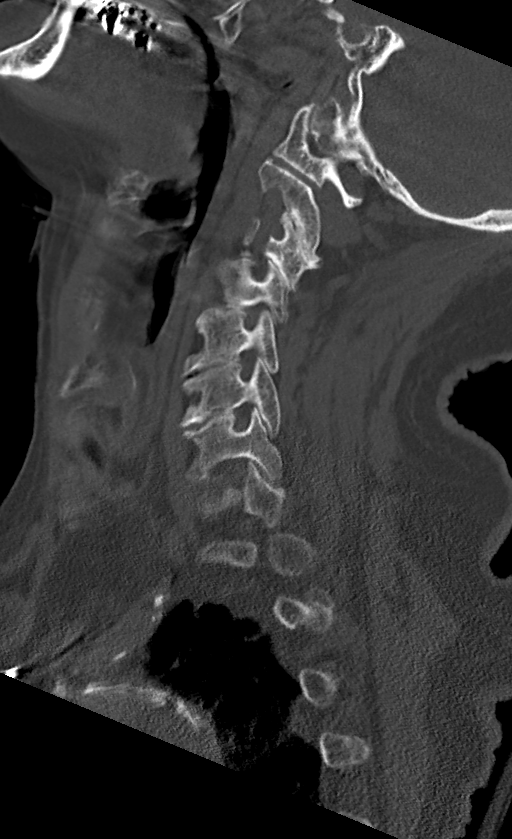

[9 of 33 positions shown; findings below may reference images not displayed]

FINDINGS: CT HEAD FINDINGS

Brain: Mild streak artifact across the posterior fossa from dental
amalgam. No evidence of acute infarction, hemorrhage, hydrocephalus,
extra-axial collection, visible mass lesion or mass effect.
Symmetric prominence of the ventricles, cisterns and sulci
compatible with parenchymal volume loss. Patchy areas of white
matter hypoattenuation are most compatible with chronic
microvascular angiopathy.

Vascular: Atherosclerotic calcification of the carotid siphons and
intradural vertebral arteries. No hyperdense vessel.

Skull: No significant scalp swelling or hematoma. No calvarial
fracture or focal osseous abnormality.

Sinuses/Orbits: Paranasal sinuses and mastoid air cells are
predominantly clear. Middle ear cavities are clear. Debris in the
right external auditory canal. Orbital structures are unremarkable
aside from prior lens extractions.

Other: Bilateral TMJ arthrosis.

CT CERVICAL SPINE FINDINGS

Alignment: Cervical stabilization collar in place at the time exam.
Preservation of the normal cervical lordosis. Levocurvature of the
cervical spine with dextrocurvature of the imaged thoracic levels.
Some of which may be positional. No evidence of traumatic listhesis.
No abnormally widened, perched or jumped facets. Normal alignment of
the craniocervical and atlantoaxial articulations accounting for
mild rightward cranial rotation.

Skull base and vertebrae: No acute skull base fracture. No vertebral
body fracture or height loss. Normal bone mineralization. No
worrisome osseous lesions. Arthrosis about the atlantodental and
basion dens intervals. Additional multilevel cervical spondylitic
changes, further detailed below.

Soft tissues and spinal canal: No pre or paravertebral fluid or
swelling. No visible canal hematoma. Airways patent. No concerning
adenopathy. Calcifications proximal great vessels and aortic arch as
well as the cervical carotids. Partial visualization a left chest
wall pacer pack and subclavian leads.

Disc levels: Multilevel intervertebral disc height loss with
spondylitic endplate changes. Some multilevel disc osteophyte
complexes partially efface the ventral thecal sac with more
pronounced findings at the C3-4, C4-5 level where this results in at
least mild canal stenosis. Multilevel uncinate spurring facet
hypertrophic changes result in mild multilevel neural foraminal
narrowing with more moderate narrowing at C3-4 as well.

Upper chest: Progressive biapical pleuroparenchymal scarring and
reticular interstitial changes, incompletely assessed on this exam.
Consolidative process or convincing edema. No acute traumatic
findings in the upper chest.

Other: Normal thyroid.
IMPRESSION: 1. No acute intracranial abnormality. No significant scalp swelling
or hematoma. No calvarial fracture.
2. Background of microvascular angiopathy and diffuse parenchymal
loss.
3. No acute fracture or traumatic listhesis of the cervical spine.
4. Possible cervical spondylitic changes; most pronounced about the
atlantodental and basion dens interval as well as at the C3-4 level
there is mild canal stenosis and moderate bilateral foraminal
narrowing.
5. Progressive scarring and interstitial reticular changes in lung
apices. Incompletely assessed on this exam. Could consider
outpatient HRCT for further interrogation as warranted.
6. Intracranial, cervical carotid and aortic atherosclerosis.
